# Patient Record
Sex: Male | Born: 1940
Health system: Southern US, Community
[De-identification: ages and names within clinical notes are randomized; demographics above are authoritative.]

## PROBLEM LIST (undated history)

## (undated) DIAGNOSIS — O1410 Severe pre-eclampsia, unspecified trimester: Secondary | ICD-10-CM

## (undated) DIAGNOSIS — Z950 Presence of cardiac pacemaker: Secondary | ICD-10-CM

## (undated) DIAGNOSIS — E7800 Pure hypercholesterolemia, unspecified: Secondary | ICD-10-CM

## (undated) DIAGNOSIS — I3139 Other pericardial effusion (noninflammatory): Secondary | ICD-10-CM

## (undated) DIAGNOSIS — I639 Cerebral infarction, unspecified: Secondary | ICD-10-CM

## (undated) DIAGNOSIS — C801 Malignant (primary) neoplasm, unspecified: Secondary | ICD-10-CM

## (undated) DIAGNOSIS — E119 Type 2 diabetes mellitus without complications: Secondary | ICD-10-CM

## (undated) DIAGNOSIS — I10 Essential (primary) hypertension: Secondary | ICD-10-CM

## (undated) DIAGNOSIS — C61 Malignant neoplasm of prostate: Secondary | ICD-10-CM

## (undated) DIAGNOSIS — I672 Cerebral atherosclerosis: Secondary | ICD-10-CM

## (undated) DIAGNOSIS — I441 Atrioventricular block, second degree: Secondary | ICD-10-CM

## (undated) DIAGNOSIS — Z8673 Personal history of transient ischemic attack (TIA), and cerebral infarction without residual deficits: Secondary | ICD-10-CM

## (undated) DIAGNOSIS — M1990 Unspecified osteoarthritis, unspecified site: Secondary | ICD-10-CM

## (undated) DIAGNOSIS — I313 Pericardial effusion (noninflammatory): Secondary | ICD-10-CM

## (undated) DIAGNOSIS — G4733 Obstructive sleep apnea (adult) (pediatric): Secondary | ICD-10-CM

## (undated) HISTORY — DX: Presence of cardiac pacemaker: Z95.0

## (undated) HISTORY — DX: Obstructive sleep apnea (adult) (pediatric): G47.33

## (undated) HISTORY — DX: Malignant (primary) neoplasm, unspecified: C80.1

## (undated) HISTORY — DX: Type 2 diabetes mellitus without complications: E11.9

## (undated) HISTORY — DX: Unspecified osteoarthritis, unspecified site: M19.90

## (undated) HISTORY — DX: Essential (primary) hypertension: I10

## (undated) HISTORY — DX: Other pericardial effusion (noninflammatory): I31.39

## (undated) HISTORY — DX: Cerebral atherosclerosis: I67.2

## (undated) HISTORY — DX: Cerebral infarction, unspecified: I63.9

## (undated) HISTORY — DX: Atrioventricular block, second degree: I44.1

## (undated) HISTORY — DX: Severe pre-eclampsia, unspecified trimester: O14.10

## (undated) HISTORY — DX: Pure hypercholesterolemia, unspecified: E78.00

## (undated) HISTORY — DX: Personal history of transient ischemic attack (TIA), and cerebral infarction without residual deficits: Z86.73

## (undated) HISTORY — PX: EYE SURGERY: SHX253

## (undated) HISTORY — DX: Pericardial effusion (noninflammatory): I31.3

## (undated) HISTORY — DX: Malignant neoplasm of prostate: C61

---

## 1992-06-09 DIAGNOSIS — I639 Cerebral infarction, unspecified: Secondary | ICD-10-CM

## 1992-06-09 HISTORY — DX: Cerebral infarction, unspecified: I63.9

## 2000-06-09 DIAGNOSIS — C801 Malignant (primary) neoplasm, unspecified: Secondary | ICD-10-CM

## 2000-06-09 HISTORY — PX: PROSTATE SURGERY: SHX751

## 2000-06-09 HISTORY — DX: Malignant (primary) neoplasm, unspecified: C80.1

## 2005-05-03 ENCOUNTER — Inpatient Hospital Stay (HOSPITAL_COMMUNITY): Admission: EM | Admit: 2005-05-03 | Discharge: 2005-05-10 | Payer: Self-pay | Admitting: *Deleted

## 2005-05-06 ENCOUNTER — Encounter: Payer: Self-pay | Admitting: Cardiology

## 2005-05-09 HISTORY — PX: PACEMAKER INSERTION: SHX728

## 2005-09-26 DIAGNOSIS — G4733 Obstructive sleep apnea (adult) (pediatric): Secondary | ICD-10-CM

## 2005-09-26 HISTORY — DX: Obstructive sleep apnea (adult) (pediatric): G47.33

## 2010-12-12 ENCOUNTER — Emergency Department (HOSPITAL_COMMUNITY): Payer: No Typology Code available for payment source

## 2010-12-12 ENCOUNTER — Emergency Department (HOSPITAL_COMMUNITY)
Admission: EM | Admit: 2010-12-12 | Discharge: 2010-12-12 | Disposition: A | Discharge status: Home / Self Care | Payer: No Typology Code available for payment source | Attending: Emergency Medicine | Admitting: Emergency Medicine

## 2010-12-12 DIAGNOSIS — M545 Low back pain, unspecified: Secondary | ICD-10-CM | POA: Insufficient documentation

## 2010-12-12 DIAGNOSIS — Y92410 Unspecified street and highway as the place of occurrence of the external cause: Secondary | ICD-10-CM | POA: Insufficient documentation

## 2010-12-12 DIAGNOSIS — M542 Cervicalgia: Secondary | ICD-10-CM | POA: Insufficient documentation

## 2010-12-12 DIAGNOSIS — I10 Essential (primary) hypertension: Secondary | ICD-10-CM | POA: Insufficient documentation

## 2010-12-12 DIAGNOSIS — Z950 Presence of cardiac pacemaker: Secondary | ICD-10-CM | POA: Insufficient documentation

## 2011-01-01 ENCOUNTER — Ambulatory Visit (HOSPITAL_COMMUNITY)
Admission: RE | Admit: 2011-01-01 | Discharge: 2011-01-01 | Disposition: A | Discharge status: Home / Self Care | Payer: No Typology Code available for payment source | Source: Ambulatory Visit | Attending: Pulmonary Disease | Admitting: Pulmonary Disease

## 2011-01-01 DIAGNOSIS — M6281 Muscle weakness (generalized): Secondary | ICD-10-CM | POA: Insufficient documentation

## 2011-01-01 DIAGNOSIS — IMO0001 Reserved for inherently not codable concepts without codable children: Secondary | ICD-10-CM | POA: Insufficient documentation

## 2011-01-01 DIAGNOSIS — M542 Cervicalgia: Secondary | ICD-10-CM | POA: Insufficient documentation

## 2011-01-01 NOTE — Patient Instructions (Signed)
HEP
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
## 2011-01-07 ENCOUNTER — Ambulatory Visit (HOSPITAL_COMMUNITY)
Admission: RE | Admit: 2011-01-07 | Discharge: 2011-01-07 | Disposition: A | Discharge status: Home / Self Care | Payer: No Typology Code available for payment source | Source: Ambulatory Visit | Attending: Pulmonary Disease | Admitting: Pulmonary Disease

## 2011-01-07 NOTE — Progress Notes (Signed)
Physical Therapy Treatment Patient Name: FAVIO MODER WUJWJ'X Date: 01/07/2011  Time in:  8:47 Time out:  9:34 Visits #: 2/2 out of 12 Charges:  therex 14', [place] 8', MHP 10'    SUBJECTIVE: Symptoms/Limitations Symptoms: continued neck pain.  States last treatment helped some.  Currently 8/10 pain R mid trap area. Pain Assessment Currently in Pain?: Yes Pain Score:   8 Pain Location: Neck Pain Orientation: Right Pain Type: Acute pain Pain Radiating Towards: Right mid trap and shoulder Pain Frequency: Intermittent Pain Relieving Factors: heat temporarily relieves  Precautions/Restrictions:  None Noted        OBJECTIVE: Exercise/Treatments  Therex:  Warm-up:  UBE X 4' backward       Seated:   W-Back 10X     Cervical Ret 10X     Side Bends Bilaterally 10X     Cerv. Ext 10X     Shoulder Shrugs 10X  Modalities Modalities: Moist Heat Moist Heat Therapy Number Minutes Moist Heat: 10 Minutes Moist Heat Location: Shoulder Ultrasound Ultrasound Location: Right Mid trap area Ultrasound Parameters: soundhead, 1.5w/CM2 for 8 minutes Ultrasound Goals: Pain;Other (Comment) (spasm) Weight Bearing Technique Weight Bearing Technique: No  Goals PT Short Term Goals Short Term Goal 1: I HEP Short Term Goal 1 Progress: Partly met Short Term Goal 2: ROM to increase 50% to allow ease of turning his head to the right or left Short Term Goal 2 Progress: Not met Short Term Goal 3: strength increased one level to allow pain to decrease by 4 Short Term Goal 3 Progress: Not met PT Long Term Goals Long Term Goal 1: I in advanced HEP Long Term Goal 1 Progress: Not met Long Term Goal 2: ROM wnl to allow safe driving Long Term Goal 2 Progress: Not met Long Term Goal 3: strength to be at least 4/5 to allow pain to decrease by 8 levels. Long Term Goal 3 Progress: Not met  End of Session Patient Active Problem List  Diagnoses  . Cervicalgia  . Muscle weakness (generalized)   PT  - End of Session Activity Tolerance: Patient tolerated treatment well General Behavior During Session: [facility] for tasks performed Cognition: [facility] for tasks performed PT Assessment and Plan Clinical Impression Statement: Pt. very protective with movements; very little ROM secondary to pain and spasm. PT Plan: Continue to progress per POC.  Add corner stretch and X-->V next treatment.  Bascom Levels, Amy B 01/07/2011, 9:34 AM

## 2011-01-07 NOTE — Patient Instructions (Signed)
Pt. Instructed to continue HEP.

## 2011-01-09 ENCOUNTER — Ambulatory Visit (HOSPITAL_COMMUNITY)
Admission: RE | Admit: 2011-01-09 | Discharge: 2011-01-09 | Disposition: A | Discharge status: Home / Self Care | Payer: No Typology Code available for payment source | Source: Ambulatory Visit | Attending: Pulmonary Disease | Admitting: Pulmonary Disease

## 2011-01-09 DIAGNOSIS — M542 Cervicalgia: Secondary | ICD-10-CM | POA: Insufficient documentation

## 2011-01-09 DIAGNOSIS — IMO0001 Reserved for inherently not codable concepts without codable children: Secondary | ICD-10-CM | POA: Insufficient documentation

## 2011-01-09 DIAGNOSIS — M6281 Muscle weakness (generalized): Secondary | ICD-10-CM | POA: Insufficient documentation
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
## 2011-01-09 NOTE — Patient Instructions (Signed)
HEP

## 2011-01-10 ENCOUNTER — Ambulatory Visit (HOSPITAL_COMMUNITY)
Admission: RE | Admit: 2011-01-10 | Discharge: 2011-01-10 | Disposition: A | Discharge status: Home / Self Care | Payer: No Typology Code available for payment source | Source: Ambulatory Visit | Attending: Pulmonary Disease | Admitting: Pulmonary Disease
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
## 2011-01-14 ENCOUNTER — Ambulatory Visit (HOSPITAL_COMMUNITY)
Admission: RE | Admit: 2011-01-14 | Discharge: 2011-01-14 | Disposition: A | Discharge status: Home / Self Care | Payer: No Typology Code available for payment source | Source: Ambulatory Visit | Attending: Pulmonary Disease | Admitting: Pulmonary Disease

## 2011-01-14 DIAGNOSIS — M542 Cervicalgia: Secondary | ICD-10-CM

## 2011-01-14 DIAGNOSIS — M6281 Muscle weakness (generalized): Secondary | ICD-10-CM

## 2011-01-14 NOTE — Progress Notes (Signed)
Physical Therapy Treatment Patient Name: Richard Alvarado ZOXWR'U Date: 01/14/2011  Time In: 8:02 Time Out: 8:45 Visit #: 5/5 Next Re-eval: 02/01/2011 Charge: therex 30 min Manual 5 min [place] 8 min  Subjective: Symptoms/Limitations Symptoms: Feeling little better today, the ultrasound has been reducing pain, would rate it a 7/10. Pain Assessment Pain Score:   7 Pain Location: Neck Pain Orientation: Right Pain Type: Chronic pain  Objective:   Exercise/Treatments  UBE (Upper Arm Bike): 4' backward  W-Back 10X5" (2#) X to V 10 reps Cervical Ret 10X  Side Bends Bilaterally 10X  Cerv. Ext 10X  Shoulder Shrugs 10X Theraband green Shoulder Extension: 15 reps Row: 15 reps  Scapular Retraction: 15reps x 5"   U.S. soundhead, 1.5w/CM2 for 8 minutes  Goals   End of Session Patient Active Problem List  Diagnoses  . Cervicalgia  . Muscle weakness (generalized)   PT Assessment and Plan Clinical Impression Statement: Pt still with limited ROM, ROM improved following [place] and PROM. PT Plan: continue to improve ROM  Juel Burrow 01/14/2011, 8:50 AM

## 2011-01-16 ENCOUNTER — Ambulatory Visit (HOSPITAL_COMMUNITY)
Admission: RE | Admit: 2011-01-16 | Discharge: 2011-01-16 | Disposition: A | Discharge status: Home / Self Care | Payer: No Typology Code available for payment source | Source: Ambulatory Visit | Attending: Physical Therapy | Admitting: Physical Therapy

## 2011-01-16 NOTE — Progress Notes (Signed)
Physical Therapy Treatment Patient Name: Richard Alvarado BJYNW'G Date: 01/16/2011  Time In: 8:10  Time Out: 8:45  Visit #: 6/6 Next Re-eval: 02/01/2011  Charge: therex 12 min, Manual 10 min, [place] 8 min   SUBJECTIVE: Symptoms/Limitations Symptoms: feeling better today, 6/10 pain Pain Assessment Currently in Pain?: Yes Pain Score:   6 Pain Location: Neck Pain Orientation: Right Pain Type: Chronic pain Pain Radiating Towards: Right mid trap and shoulder Multiple Pain Sites: No        OBJECTIVE: Exercise/Treatments  UBE (Upper Arm Bike): 4' backward  W-Back 15X5" (2#)  X to V 15 reps  Cervical Ret 10X  Side Bends Bilaterally (HEP)  Cerv. Ext (HEP) Shoulder Shrugs 10X  Theraband green  Shoulder Extension: 15 reps  Row: 15 reps  Scapular Retraction: 15reps x 5  Modalities Modalities: Ultrasound Manual Therapy Manual Therapy: Other (comment) Other Manual Therapy: massage to Right upper trap Ultrasound Ultrasound Location: Right upper trap Ultrasound Parameters: 1.5w/cm2 continuous with soundhead. Ultrasound Goals: Pain;Other (Comment) (spasm) Weight Bearing Technique Weight Bearing Technique: No  Goals   End of Session Patient Active Problem List  Diagnoses  . Cervicalgia  . Muscle weakness (generalized)   PT - End of Session Activity Tolerance: Patient tolerated treatment well General Behavior During Session: [facility] for tasks performed PT Assessment and Plan Clinical Impression Statement: Pt. compliant with HEP, Less guarding with [place] and massage today with overall decreased pain. PT Treatment/Interventions: Therapeutic exercise (Ultrasound and massage) PT Plan: continue per POC  Frazier, Amy B 01/16/2011, 8:51 AM

## 2011-01-17 ENCOUNTER — Ambulatory Visit (HOSPITAL_COMMUNITY)
Admission: RE | Admit: 2011-01-17 | Discharge: 2011-01-17 | Disposition: A | Discharge status: Home / Self Care | Payer: No Typology Code available for payment source | Source: Ambulatory Visit | Attending: Physical Therapy | Admitting: Physical Therapy

## 2011-01-17 NOTE — Progress Notes (Signed)
Physical Therapy Treatment Patient Name: Richard Alvarado JYNWG'N Date: 01/17/2011  HPI: cervical pain Symptoms/Limitations Symptoms: My neck is feeling better 5/10 Pain Assessment Currently in Pain?: Yes Pain Score:   5 Pain Location: Neck Pain Orientation: Left Pain Type: Chronic pain Pain Onset: 1 to 4 weeks ago Pain Frequency: Intermittent Multiple Pain Sites: No  Precautions/Restrictions     Mobility (including Balance)       Exercise/Treatments Cervical Exercises Neck Lateral Flexion - Right: 10 reps (with massage and gentle PROM) Neck Lateral Flexion - Left: 10 reps Neck Rotation - Right: 10 reps (with massage and gentle PROM) Neck Rotation - Left: 10 reps (with massage and gentle PROM) Shoulder Extension: 15 reps Theraband Level (Shoulder Extension): Level 3 (Green) Row: 15 reps Theraband Level (Row): Level 3 (Green) Scapular Retraction: 15 reps Theraband Level (Scapular Retraction): Level 3 (Green) W Back: 15 reps;Weight X to V: 15 reps Additional Neck Exercises Wall Pushups/Modified Pushups: 10x UBE (Upper Arm Bike): 4' backward Lumbar Exercises Scapular Retraction: 15 reps Theraband Level (Scapular Retraction): Level 3 (Green) Row: 15 reps Theraband Level (Row): Level 3 (Green) Shoulder Extension: 15 reps Theraband Level (Shoulder Extension): Level 3 (Green) Lumbar Machine Exercises UBE (Upper Arm Bike): 4' backward Modalities Modalities: Ultrasound Ultrasound Ultrasound Location: L mid trap Ultrasound Parameters: 1.5 w/cm2 Ultrasound Goals: Pain  Goals PT Short Term Goals Short Term Goal 1 Progress: Met Short Term Goal 2 Progress: Met Short Term Goal 3 Progress: Met PT Long Term Goals Long Term Goal 1 Progress: Progressing toward goal Long Term Goal 2 Progress: Progressing toward goal Long Term Goal 3 Progress: Progressing toward goal End of Session Patient Active Problem List  Diagnoses  . Cervicalgia  . Muscle weakness (generalized)    PT - End of Session Activity Tolerance: Patient tolerated treatment well General Behavior During Session: [facility] for tasks performed Cognition: [facility] for tasks performed PT Assessment and Plan Clinical Impression Statement: improving subjective and objectively PT Frequency: Min 3X/week PT Plan: D/C T-band exercises to HEP  RUSSELL,CINDY 01/17/2011, 8:53 AM

## 2011-01-17 NOTE — Patient Instructions (Signed)
Given T-band

## 2011-01-21 ENCOUNTER — Ambulatory Visit (HOSPITAL_COMMUNITY)
Admission: RE | Admit: 2011-01-21 | Discharge: 2011-01-21 | Disposition: A | Discharge status: Home / Self Care | Payer: No Typology Code available for payment source | Source: Ambulatory Visit | Attending: *Deleted | Admitting: *Deleted

## 2011-01-21 NOTE — Progress Notes (Addendum)
Physical Therapy Treatment Patient Name: Richard Alvarado ZOXWR'U Date: 01/21/2011  Time In: 8:05  Time Out: 8:45  Visit #: 8/8  Next Re-eval: 02/01/2011  Charge: therex 15 min, Manual 8 min, [place] 8 min     HPI: Symptoms/Limitations Symptoms: Pain is better than it has been. Pain Assessment Currently in Pain?: Yes Pain Score:   5 Pain Location: Neck Pain Orientation: Right Pain Type: Chronic pain Pain Frequency: Intermittent   Exercise/Treatments  Warm up: UBE (Upper Arm Bike): 4' backward   Standing:  Theraband green  Shoulder Extension: 20 reps  Row: 20 reps  Scapular Retraction: 20reps x 5 Wall push-ups x 10  Seated: W-Back 15X5" (2#)  X to V 15 reps  Cervical Ret 10X  Shoulder Shrugs 10X   Modalities Modalities: Ultrasound Manual Therapy Other Manual Therapy: STM with MFR to R upper trap Ultrasound Ultrasound Location: R upper trap Ultrasound Parameters: 1.5 W/cm2  3 mHz  Goals PT Short Term Goals Short Term Goal 1: I HEP Short Term Goal 1 Progress: Met Short Term Goal 2: ROM to increase 50% to allow ease of turning his head to the right or left Short Term Goal 2 Progress: Met Short Term Goal 3: strength increased one level to allow pain to decrease by 4 Short Term Goal 3 Progress: Met PT Long Term Goals Long Term Goal 1: I in advanced HEP Long Term Goal 1 Progress: Progressing toward goal Long Term Goal 2: ROM wnl to allow safe driving Long Term Goal 2 Progress: Progressing toward goal Long Term Goal 3: strength to be at least 4/5 to allow pain to decrease by 8 levels. Long Term Goal 3 Progress: Progressing toward goal End of Session Patient Active Problem List  Diagnoses  . Cervicalgia  . Muscle weakness (generalized)   PT - End of Session Activity Tolerance: Patient tolerated treatment well General Behavior During Session: [facility] for tasks performed Cognition: [facility] for tasks performed PT Assessment and Plan Clinical Impression Statement:  Pt completes therex without c/o increased pain. Pt requires vcs for proper technique with scapular tband therex. Tband exercises completed this tx to ensure proper technique. Pt with pain decrease to 4/10 after ultrasound and  STM, per pt. PT Treatment/Interventions: Therapeutic exercise;Other (comment) (STM and ultrasound) PT Plan: Continue to progress per PT POC. D/C tband ex to HEP.  Seth Bake [facility] 01/21/2011, 8:54 AM

## 2011-01-23 ENCOUNTER — Ambulatory Visit (HOSPITAL_COMMUNITY)
Admission: RE | Admit: 2011-01-23 | Discharge: 2011-01-23 | Disposition: A | Discharge status: Home / Self Care | Payer: No Typology Code available for payment source | Source: Ambulatory Visit | Attending: Physical Therapy | Admitting: Physical Therapy

## 2011-01-23 NOTE — Progress Notes (Signed)
Physical Therapy Treatment Patient Details  Name: CAPRICE WASKO MRN: [id number] Date of Birth: [birth year]  Today's Date: 01/23/2011 Time: 4098-1191 Time Calculation (min): 40 min Visit#: 9 of 9 Re-eval: 02/01/11 Charges:Therex x 16' ; [place] x 8'; Manual therapy x 10'  Subjective: Symptoms/Limitations Symptoms: No pain today. Pain Assessment Currently in Pain?: No/denies   Exercise/Treatments  Warm up:  UBE (Upper Arm Bike): 4' backward   Standing:  Wall push-ups x 10   Seated:  W-Back 15X5" (2#)  X to V 15 reps (2#) Cervical Ret 15X  Shoulder Shrugs 10X (2#) Upper trap stretch 3X30"  Modalities Modalities: Ultrasound Manual Therapy Manual Therapy: Other (comment) Other Manual Therapy: STM with MFR to R upper trap x 10" Ultrasound Ultrasound Location: R upper trap  Ultrasound Parameters: 1.5 W/cm2 3 mHz  Physical Therapy Assessment and Plan PT Assessment and Plan Clinical Impression Statement: Began upper trap stretch to decrease mm tightness in R upper trap. HEP given for upper strap stretch, see media. Pt with mm spasms in R upper trap and SCM. Mm spasm decreased 40% after STM with MFR. Added 2# wt to X to V and shoulder shrugs to increase strength w/o difficulty. PT Duration: Other (comment) PT Treatment/Interventions: Therapeutic exercise;Other (comment) (Manual therapy and ultrasound) PT Plan: Continue to progress strength and ROM.     Goals PT Short Term Goals PT Short Term Goal 1: I HEP PT Short Term Goal 1 - Progress: Met PT Short Term Goal 2: ROM to increase 50% to allow ease of turning his head to the right or left PT Short Term Goal 2 - Progress: Met PT Short Term Goal 3: strength increased one level to allow pain to decrease by 4 PT Short Term Goal 3 - Progress: Met PT Long Term Goals PT Long Term Goal 1: I in advanced HEP PT Long Term Goal 1 - Progress: Progressing toward goal PT Long Term Goal 2: ROM wnl to allow safe driving PT Long Term  Goal 2 - Progress: Progressing toward goal Long Term Goal 3: strength to be at least 4/5 to allow pain to decrease by 8 levels. Long Term Goal 3 Progress: Progressing toward goal  Problem List Patient Active Problem List  Diagnoses  . Cervicalgia  . Muscle weakness (generalized)    PT - End of Session Activity Tolerance: Patient tolerated treatment well General Behavior During Session: [facility] for tasks performed Cognition: [facility] for tasks performed  Antonieta Iba 01/23/2011, 9:02 AM

## 2011-01-24 ENCOUNTER — Ambulatory Visit (HOSPITAL_COMMUNITY)
Admission: RE | Admit: 2011-01-24 | Discharge: 2011-01-24 | Disposition: A | Discharge status: Home / Self Care | Payer: No Typology Code available for payment source | Source: Ambulatory Visit | Attending: *Deleted | Admitting: *Deleted

## 2011-01-24 NOTE — Progress Notes (Signed)
Physical Therapy Treatment Patient Details  Name: Richard Alvarado MRN: [id number] Date of Birth: Aug 12, 1940  Today's Date: 01/24/2011 Time: 4098-1191 Time Calculation (min): 47 min Visit#: 9 Re-eval: 02/01/11 Charges: Therex 74' ; [place] 8' ; Manual therapy 11'  Subjective: Symptoms/Limitations Symptoms: I'm a little sore today. Pain Assessment Currently in Pain?: Yes Pain Score:   4 Pain Location: Neck Pain Orientation: Right   Exercise/Treatments  Warm up:  UBE (Upper Arm Bike): 4' backward   Standing:  Wall push-ups x 15   Seated:  W-Back 15X5" (2#)  X to V 15 reps (2#)   Shoulder Shrugs 10X (2#)  Upper trap stretch 3X30"   Prone: Cervical retraction 10 reps Shoulder ext 10 reps  Modalities Modalities: Ultrasound Manual Therapy Manual Therapy: Other (comment) Other Manual Therapy: STM with SCS to R upper trap x 11' Ultrasound Ultrasound Location: R upper trap Ultrasound Parameters: 1.5 W/cm2 3 mHz x 8'  Physical Therapy Assessment and Plan PT Assessment and Plan Clinical Impression Statement: Began new prone therex. Pt requires VCs and visual demo for proper technique. Pt with decreased pain after manual therapy with most relief after SCS. PT Treatment/Interventions: Therapeutic exercise;Other (comment) (manual therapy and ultrasound) PT Plan: Continue to progress. Replace [place] with MHP next tx before manual therapy.    Goals PT Short Term Goals PT Short Term Goal 1: I HEP PT Short Term Goal 1 - Progress: Met PT Short Term Goal 2: ROM to increase 50% to allow ease of turning his head to the right or left PT Short Term Goal 2 - Progress: Met PT Short Term Goal 3: strength increased one level to allow pain to decrease by 4 PT Short Term Goal 3 - Progress: Met PT Long Term Goals PT Long Term Goal 1: I in advanced HEP PT Long Term Goal 1 - Progress: Progressing toward goal PT Long Term Goal 2: ROM wnl to allow safe driving PT Long Term Goal 2 - Progress:  Progressing toward goal Long Term Goal 3: strength to be at least 4/5 to allow pain to decrease by 8 levels. Long Term Goal 3 Progress: Progressing toward goal  Problem List Patient Active Problem List  Diagnoses  . Cervicalgia  . Muscle weakness (generalized)    PT - End of Session Activity Tolerance: Patient tolerated treatment well General Behavior During Session: [facility] for tasks performed Cognition: [facility] for tasks performed  Antonieta Iba 01/24/2011, 10:52 AM

## 2011-01-27 ENCOUNTER — Ambulatory Visit (HOSPITAL_COMMUNITY)
Admission: RE | Admit: 2011-01-27 | Discharge: 2011-01-27 | Disposition: A | Discharge status: Home / Self Care | Payer: No Typology Code available for payment source | Source: Ambulatory Visit | Attending: Physical Therapy | Admitting: Physical Therapy

## 2011-01-27 NOTE — Progress Notes (Signed)
Physical Therapy Treatment Patient Details  Name: Richard Alvarado MRN: [id number] Date of Birth: 08-Mar-1941  Today's Date: 01/27/2011 Time: 4098-1191 Time Calculation (min): 48 min Visit#: 10 of 12 Re-eval: 01/31/11 Charges:  Therex 25', Ultrasound 8', Manual therapy 10'  Subjective: Symptoms/Limitations Symptoms: Pt. reports an improvement.  Overall decreased pain; 3/10 Pain Assessment Currently in Pain?: Yes Pain Score:   3 Pain Location: Neck     Exercise/Treatments Warm up:  UBE (Upper Arm Bike): 4' backward  Standing:  Wall push-ups x 15  Seated:  W-Back 15X5" (2#)  X to V 15 reps (2#)  Shoulder Shrugs 15X (2#)  Upper trap stretch 3X30"  Prone:  Rows 15 reps  Shoulder ext 15 reps  Scapular retraction (w-back)  15X UE lifts (one at a time) 10X  Modalities Modalities: Ultrasound Manual Therapy Manual Therapy: Other (comment) (STM with strain/counterstrain to R upper trap) Ultrasound Ultrasound Location: R upper trap Ultrasound Parameters: 1.5 w/cm2 with 3 MHz for 8 minutes Ultrasound Goals: Pain  Physical Therapy Assessment and Plan PT Assessment and Plan Clinical Impression Statement: spasm inferior R occipital region resolved with STM.  Pt very guarded during STM but able to relax after SCS technique.  Overall decreased pain and spasm.  Continued [place] secondary to pain reduction after last visit. PT Treatment/Interventions: Therapeutic exercise (Manual therapy and Ultrasound) PT Plan: Continue X 2 more visits and re-evaluate.    Goals PT Short Term Goals PT Short Term Goal 1: I HEP PT Short Term Goal 1 - Progress: Met PT Short Term Goal 2: ROM to increase 50% to allow ease of turning his head to the right or left PT Short Term Goal 2 - Progress: Met PT Short Term Goal 3: strength increased one level to allow pain to decrease by 4 PT Short Term Goal 3 - Progress: Met PT Long Term Goals PT Long Term Goal 1: I in advanced HEP PT Long Term Goal 1 -  Progress: Progressing toward goal PT Long Term Goal 2: ROM wnl to allow safe driving PT Long Term Goal 2 - Progress: Progressing toward goal Long Term Goal 3: strength to be at least 4/5 to allow pain to decrease by 8 levels. Long Term Goal 3 Progress: Progressing toward goal  Problem List Patient Active Problem List  Diagnoses  . Cervicalgia  . Muscle weakness (generalized)    PT - End of Session Activity Tolerance: Patient tolerated treatment well General Behavior During Session: [facility] for tasks performed Cognition: [facility] for tasks performed  Emeline Gins B 01/27/2011, 5:05 PM

## 2011-01-31 ENCOUNTER — Ambulatory Visit (HOSPITAL_COMMUNITY)
Admission: RE | Admit: 2011-01-31 | Discharge: 2011-01-31 | Disposition: A | Discharge status: Home / Self Care | Payer: No Typology Code available for payment source | Source: Ambulatory Visit | Attending: Pulmonary Disease | Admitting: Pulmonary Disease

## 2011-01-31 DIAGNOSIS — M542 Cervicalgia: Secondary | ICD-10-CM

## 2011-01-31 DIAGNOSIS — M6281 Muscle weakness (generalized): Secondary | ICD-10-CM

## 2011-01-31 NOTE — Progress Notes (Signed)
Physical Therapy Evaluation  Patient Details  Name: JAHMEIR GEISEN MRN: [id number] Date of Birth: [birth year]  Today's Date: 01/31/2011 Time: 9562-1308 Time Calculation (min): 49 min Visit#: 12 of 12 Re-eval: 01/31/11  Past Medical History: No past medical history on file. Past Surgical History: No past surgical history on file.  Subjective Symptoms/Limitations Symptoms: Pain is better 2.5  Precautions/Restrictions     Prior Functioning     Cognition    Sensation/Coordination/Flexibility    Assessment Cervical AROM Cervical Flexion:  (wnl) Cervical Extension: decreased 20% Cervical - Right Side Bend: wnl Cervical - Left Side Bend: wnl Cervical - Right Rotation: wnl Cervical - Left Rotation: decreased 20% Cervical Strength Cervical Flexion: 4/5 Cervical Extension: 4/5 Cervical - Right Side Bend: 4/5 Cervical - Left Side Bend: 4/5  Mobility (including Balance)       Exercise/Treatments Stretches  Passive stretching with massage. Neck Exercises Shoulder Extension: 15 reps;Prone;Other (comment) (3#) Row: Prone;15 reps (3#) W Back: Prone;15 reps Additional Neck Exercises UBE (Upper Arm Bike): 4' Plank:  (on forearms 10 seconds x 3.)  Modalities Modalities: Ultrasound Manual Therapy Manual Therapy: Myofascial release Myofascial Release: massage to R trap. Ultrasound Ultrasound Location: R trap Ultrasound Parameters: 1.5 w/cm2 x8 min Ultrasound Goals: Pain  Physical Therapy Assessment and Plan PT Assessment and Plan Clinical Impression Statement: Pt has progressed well with therapy recommend discharge and patient to work on ROM and strength on his won. PT Plan: discharge patient.    Goals PT Short Term Goals PT Short Term Goal 1 - Progress: Met PT Short Term Goal 2 - Progress: Met PT Short Term Goal 3 - Progress: Met PT Long Term Goals PT Long Term Goal 1 - Progress: Met PT Long Term Goal 2 - Progress: Progressing toward goal Long Term Goal  3 Progress: Met  Problem List Patient Active Problem List  Diagnoses  . Cervicalgia  . Muscle weakness (generalized)    PT - End of Session Activity Tolerance: Patient tolerated treatment well General Behavior During Session: [facility] for tasks performed Cognition: [facility] for tasks performed   RUSSELL,CINDY 01/31/2011, 4:17 PM  Physician Documentation Your signature is required to indicate approval of the treatment plan as stated above.  Please sign and either send electronically or make a copy of this report for your files and return this physician signed original.   Please mark one 1.__approve of plan  2. ___approve of plan with the following conditions.   ______________________________                                                          _____________________ Physician Signature                                                                                                             Date

## 2011-01-31 NOTE — Patient Instructions (Addendum)
HEP

## 2011-06-17 DIAGNOSIS — G4730 Sleep apnea, unspecified: Secondary | ICD-10-CM | POA: Diagnosis not present

## 2011-06-17 DIAGNOSIS — E785 Hyperlipidemia, unspecified: Secondary | ICD-10-CM | POA: Diagnosis not present

## 2011-06-17 DIAGNOSIS — IMO0001 Reserved for inherently not codable concepts without codable children: Secondary | ICD-10-CM | POA: Diagnosis not present

## 2011-06-17 DIAGNOSIS — I10 Essential (primary) hypertension: Secondary | ICD-10-CM | POA: Diagnosis not present

## 2011-07-30 DIAGNOSIS — I6789 Other cerebrovascular disease: Secondary | ICD-10-CM | POA: Diagnosis not present

## 2011-07-30 DIAGNOSIS — E785 Hyperlipidemia, unspecified: Secondary | ICD-10-CM | POA: Diagnosis not present

## 2011-07-30 DIAGNOSIS — I2510 Atherosclerotic heart disease of native coronary artery without angina pectoris: Secondary | ICD-10-CM | POA: Diagnosis not present

## 2011-07-30 DIAGNOSIS — IMO0001 Reserved for inherently not codable concepts without codable children: Secondary | ICD-10-CM | POA: Diagnosis not present

## 2011-09-22 DIAGNOSIS — Z45018 Encounter for adjustment and management of other part of cardiac pacemaker: Secondary | ICD-10-CM | POA: Diagnosis not present

## 2011-09-22 DIAGNOSIS — I10 Essential (primary) hypertension: Secondary | ICD-10-CM | POA: Diagnosis not present

## 2011-09-22 DIAGNOSIS — I4510 Unspecified right bundle-branch block: Secondary | ICD-10-CM | POA: Diagnosis not present

## 2011-09-23 DIAGNOSIS — E119 Type 2 diabetes mellitus without complications: Secondary | ICD-10-CM | POA: Diagnosis not present

## 2011-09-23 DIAGNOSIS — E782 Mixed hyperlipidemia: Secondary | ICD-10-CM | POA: Diagnosis not present

## 2011-09-23 DIAGNOSIS — R5381 Other malaise: Secondary | ICD-10-CM | POA: Diagnosis not present

## 2011-09-23 DIAGNOSIS — Z79899 Other long term (current) drug therapy: Secondary | ICD-10-CM | POA: Diagnosis not present

## 2011-09-24 DIAGNOSIS — I10 Essential (primary) hypertension: Secondary | ICD-10-CM | POA: Diagnosis not present

## 2011-09-24 DIAGNOSIS — I2510 Atherosclerotic heart disease of native coronary artery without angina pectoris: Secondary | ICD-10-CM | POA: Diagnosis not present

## 2011-09-24 DIAGNOSIS — IMO0001 Reserved for inherently not codable concepts without codable children: Secondary | ICD-10-CM | POA: Diagnosis not present

## 2011-09-24 DIAGNOSIS — E785 Hyperlipidemia, unspecified: Secondary | ICD-10-CM | POA: Diagnosis not present

## 2011-10-06 DIAGNOSIS — I10 Essential (primary) hypertension: Secondary | ICD-10-CM | POA: Diagnosis not present

## 2011-11-17 DIAGNOSIS — C61 Malignant neoplasm of prostate: Secondary | ICD-10-CM | POA: Diagnosis not present

## 2011-12-02 DIAGNOSIS — C61 Malignant neoplasm of prostate: Secondary | ICD-10-CM | POA: Diagnosis not present

## 2012-01-13 DIAGNOSIS — IMO0001 Reserved for inherently not codable concepts without codable children: Secondary | ICD-10-CM | POA: Diagnosis not present

## 2012-01-13 DIAGNOSIS — G4730 Sleep apnea, unspecified: Secondary | ICD-10-CM | POA: Diagnosis not present

## 2012-01-13 DIAGNOSIS — E785 Hyperlipidemia, unspecified: Secondary | ICD-10-CM | POA: Diagnosis not present

## 2012-01-13 DIAGNOSIS — I10 Essential (primary) hypertension: Secondary | ICD-10-CM | POA: Diagnosis not present

## 2012-01-23 DIAGNOSIS — E119 Type 2 diabetes mellitus without complications: Secondary | ICD-10-CM | POA: Diagnosis not present

## 2012-01-23 DIAGNOSIS — E782 Mixed hyperlipidemia: Secondary | ICD-10-CM | POA: Diagnosis not present

## 2012-01-23 DIAGNOSIS — Z45018 Encounter for adjustment and management of other part of cardiac pacemaker: Secondary | ICD-10-CM | POA: Diagnosis not present

## 2012-01-23 DIAGNOSIS — I10 Essential (primary) hypertension: Secondary | ICD-10-CM | POA: Diagnosis not present

## 2012-01-26 DIAGNOSIS — E878 Other disorders of electrolyte and fluid balance, not elsewhere classified: Secondary | ICD-10-CM | POA: Diagnosis not present

## 2012-01-26 DIAGNOSIS — Z79899 Other long term (current) drug therapy: Secondary | ICD-10-CM | POA: Diagnosis not present

## 2012-01-26 DIAGNOSIS — E119 Type 2 diabetes mellitus without complications: Secondary | ICD-10-CM | POA: Diagnosis not present

## 2012-02-23 DIAGNOSIS — C61 Malignant neoplasm of prostate: Secondary | ICD-10-CM | POA: Diagnosis not present

## 2012-03-31 DIAGNOSIS — I2510 Atherosclerotic heart disease of native coronary artery without angina pectoris: Secondary | ICD-10-CM | POA: Diagnosis not present

## 2012-03-31 DIAGNOSIS — E119 Type 2 diabetes mellitus without complications: Secondary | ICD-10-CM | POA: Diagnosis not present

## 2012-03-31 DIAGNOSIS — E782 Mixed hyperlipidemia: Secondary | ICD-10-CM | POA: Diagnosis not present

## 2012-03-31 DIAGNOSIS — I10 Essential (primary) hypertension: Secondary | ICD-10-CM | POA: Diagnosis not present

## 2012-03-31 HISTORY — PX: NM MYOVIEW LTD: HXRAD82

## 2012-04-14 DIAGNOSIS — E109 Type 1 diabetes mellitus without complications: Secondary | ICD-10-CM | POA: Diagnosis not present

## 2012-04-14 DIAGNOSIS — I10 Essential (primary) hypertension: Secondary | ICD-10-CM | POA: Diagnosis not present

## 2012-04-14 DIAGNOSIS — IMO0001 Reserved for inherently not codable concepts without codable children: Secondary | ICD-10-CM | POA: Diagnosis not present

## 2012-04-14 DIAGNOSIS — E785 Hyperlipidemia, unspecified: Secondary | ICD-10-CM | POA: Diagnosis not present

## 2012-04-14 DIAGNOSIS — G4730 Sleep apnea, unspecified: Secondary | ICD-10-CM | POA: Diagnosis not present

## 2012-05-24 DIAGNOSIS — I10 Essential (primary) hypertension: Secondary | ICD-10-CM | POA: Diagnosis not present

## 2012-05-24 DIAGNOSIS — I4510 Unspecified right bundle-branch block: Secondary | ICD-10-CM | POA: Diagnosis not present

## 2012-05-24 DIAGNOSIS — E782 Mixed hyperlipidemia: Secondary | ICD-10-CM | POA: Diagnosis not present

## 2012-05-24 DIAGNOSIS — Z45018 Encounter for adjustment and management of other part of cardiac pacemaker: Secondary | ICD-10-CM | POA: Diagnosis not present

## 2012-05-25 DIAGNOSIS — E119 Type 2 diabetes mellitus without complications: Secondary | ICD-10-CM | POA: Diagnosis not present

## 2012-05-25 DIAGNOSIS — E782 Mixed hyperlipidemia: Secondary | ICD-10-CM | POA: Diagnosis not present

## 2012-05-25 DIAGNOSIS — R5381 Other malaise: Secondary | ICD-10-CM | POA: Diagnosis not present

## 2012-05-25 DIAGNOSIS — Z79899 Other long term (current) drug therapy: Secondary | ICD-10-CM | POA: Diagnosis not present

## 2012-05-28 ENCOUNTER — Ambulatory Visit (HOSPITAL_COMMUNITY)
Admission: RE | Admit: 2012-05-28 | Discharge: 2012-05-28 | Disposition: A | Discharge status: Home / Self Care | Payer: Medicare Other | Source: Ambulatory Visit | Attending: Cardiovascular Disease | Admitting: Cardiovascular Disease

## 2012-05-28 DIAGNOSIS — I10 Essential (primary) hypertension: Secondary | ICD-10-CM | POA: Diagnosis not present

## 2012-05-28 DIAGNOSIS — I369 Nonrheumatic tricuspid valve disorder, unspecified: Secondary | ICD-10-CM | POA: Insufficient documentation

## 2012-05-28 DIAGNOSIS — I059 Rheumatic mitral valve disease, unspecified: Secondary | ICD-10-CM | POA: Diagnosis not present

## 2012-05-28 DIAGNOSIS — I379 Nonrheumatic pulmonary valve disorder, unspecified: Secondary | ICD-10-CM | POA: Diagnosis not present

## 2012-05-28 DIAGNOSIS — I319 Disease of pericardium, unspecified: Secondary | ICD-10-CM | POA: Diagnosis not present

## 2012-05-28 DIAGNOSIS — I2589 Other forms of chronic ischemic heart disease: Secondary | ICD-10-CM | POA: Diagnosis not present

## 2012-05-28 NOTE — Progress Notes (Signed)
*  PRELIMINARY RESULTS* Echocardiogram 2D Echocardiogram has been performed.  Conrad [place] 05/28/2012, 9:46 AM

## 2012-06-07 DIAGNOSIS — R972 Elevated prostate specific antigen [PSA]: Secondary | ICD-10-CM | POA: Diagnosis not present

## 2012-06-08 DIAGNOSIS — C61 Malignant neoplasm of prostate: Secondary | ICD-10-CM | POA: Diagnosis not present

## 2012-07-15 DIAGNOSIS — I10 Essential (primary) hypertension: Secondary | ICD-10-CM | POA: Diagnosis not present

## 2012-07-15 DIAGNOSIS — G4730 Sleep apnea, unspecified: Secondary | ICD-10-CM | POA: Diagnosis not present

## 2012-07-15 DIAGNOSIS — I259 Chronic ischemic heart disease, unspecified: Secondary | ICD-10-CM | POA: Diagnosis not present

## 2012-07-15 DIAGNOSIS — E785 Hyperlipidemia, unspecified: Secondary | ICD-10-CM | POA: Diagnosis not present

## 2012-09-27 DIAGNOSIS — E782 Mixed hyperlipidemia: Secondary | ICD-10-CM | POA: Diagnosis not present

## 2012-09-27 DIAGNOSIS — I10 Essential (primary) hypertension: Secondary | ICD-10-CM | POA: Diagnosis not present

## 2012-09-27 DIAGNOSIS — Z45018 Encounter for adjustment and management of other part of cardiac pacemaker: Secondary | ICD-10-CM | POA: Diagnosis not present

## 2012-09-27 DIAGNOSIS — I442 Atrioventricular block, complete: Secondary | ICD-10-CM | POA: Diagnosis not present

## 2012-09-29 DIAGNOSIS — Z79899 Other long term (current) drug therapy: Secondary | ICD-10-CM | POA: Diagnosis not present

## 2012-11-05 ENCOUNTER — Encounter: Payer: Self-pay | Admitting: Cardiovascular Disease

## 2012-11-09 DIAGNOSIS — R972 Elevated prostate specific antigen [PSA]: Secondary | ICD-10-CM | POA: Diagnosis not present

## 2012-11-16 DIAGNOSIS — E785 Hyperlipidemia, unspecified: Secondary | ICD-10-CM | POA: Diagnosis not present

## 2012-11-16 DIAGNOSIS — E109 Type 1 diabetes mellitus without complications: Secondary | ICD-10-CM | POA: Diagnosis not present

## 2012-11-16 DIAGNOSIS — I259 Chronic ischemic heart disease, unspecified: Secondary | ICD-10-CM | POA: Diagnosis not present

## 2012-11-16 DIAGNOSIS — C61 Malignant neoplasm of prostate: Secondary | ICD-10-CM | POA: Diagnosis not present

## 2012-11-16 DIAGNOSIS — I10 Essential (primary) hypertension: Secondary | ICD-10-CM | POA: Diagnosis not present

## 2013-01-03 ENCOUNTER — Other Ambulatory Visit: Payer: Self-pay | Admitting: *Deleted

## 2013-01-03 DIAGNOSIS — R0989 Other specified symptoms and signs involving the circulatory and respiratory systems: Secondary | ICD-10-CM

## 2013-01-03 DIAGNOSIS — I10 Essential (primary) hypertension: Secondary | ICD-10-CM | POA: Diagnosis not present

## 2013-01-03 DIAGNOSIS — E782 Mixed hyperlipidemia: Secondary | ICD-10-CM | POA: Diagnosis not present

## 2013-01-03 DIAGNOSIS — Z45018 Encounter for adjustment and management of other part of cardiac pacemaker: Secondary | ICD-10-CM | POA: Diagnosis not present

## 2013-01-03 LAB — PACEMAKER DEVICE OBSERVATION

## 2013-01-04 ENCOUNTER — Other Ambulatory Visit: Payer: Self-pay | Admitting: Cardiovascular Disease

## 2013-01-04 DIAGNOSIS — E119 Type 2 diabetes mellitus without complications: Secondary | ICD-10-CM | POA: Diagnosis not present

## 2013-01-04 DIAGNOSIS — N429 Disorder of prostate, unspecified: Secondary | ICD-10-CM | POA: Diagnosis not present

## 2013-01-04 DIAGNOSIS — E782 Mixed hyperlipidemia: Secondary | ICD-10-CM | POA: Diagnosis not present

## 2013-01-04 DIAGNOSIS — R5383 Other fatigue: Secondary | ICD-10-CM | POA: Diagnosis not present

## 2013-01-04 DIAGNOSIS — R5381 Other malaise: Secondary | ICD-10-CM | POA: Diagnosis not present

## 2013-01-04 DIAGNOSIS — R6889 Other general symptoms and signs: Secondary | ICD-10-CM | POA: Diagnosis not present

## 2013-01-04 LAB — CBC WITH DIFFERENTIAL/PLATELET
Basophils Absolute: 0 10*3/uL (ref 0.0–0.1)
Basophils Relative: 1 % (ref 0–1)
Eosinophils Absolute: 0.2 10*3/uL (ref 0.0–0.7)
Eosinophils Relative: 5 % (ref 0–5)
HCT: 41.8 % (ref 39.0–52.0)
Hemoglobin: 13.6 g/dL (ref 13.0–17.0)
Lymphocytes Relative: 28 % (ref 12–46)
Lymphs Abs: 1.2 10*3/uL (ref 0.7–4.0)
MCH: 24.2 pg — ABNORMAL LOW (ref 26.0–34.0)
MCHC: 32.5 g/dL (ref 30.0–36.0)
MCV: 74.2 fL — ABNORMAL LOW (ref 78.0–100.0)
Monocytes Absolute: 0.5 10*3/uL (ref 0.1–1.0)
Monocytes Relative: 11 % (ref 3–12)
Neutro Abs: 2.3 10*3/uL (ref 1.7–7.7)
Neutrophils Relative %: 55 % (ref 43–77)
Platelets: 298 10*3/uL (ref 150–400)
RBC: 5.63 MIL/uL (ref 4.22–5.81)
RDW: 15.2 % (ref 11.5–15.5)
WBC: 4.2 10*3/uL (ref 4.0–10.5)

## 2013-01-05 LAB — COMPREHENSIVE METABOLIC PANEL
ALT: 15 U/L (ref 0–53)
AST: 16 U/L (ref 0–37)
Albumin: 4.3 g/dL (ref 3.5–5.2)
Alkaline Phosphatase: 63 U/L (ref 39–117)
BUN: 16 mg/dL (ref 6–23)
CO2: 29 mEq/L (ref 19–32)
Calcium: 9.6 mg/dL (ref 8.4–10.5)
Chloride: 103 mEq/L (ref 96–112)
Creat: 0.99 mg/dL (ref 0.50–1.35)
Glucose, Bld: 119 mg/dL — ABNORMAL HIGH (ref 70–99)
Potassium: 4.2 mEq/L (ref 3.5–5.3)
Sodium: 138 mEq/L (ref 135–145)
Total Bilirubin: 0.4 mg/dL (ref 0.3–1.2)
Total Protein: 7.9 g/dL (ref 6.0–8.3)

## 2013-01-05 LAB — LIPID PANEL
Cholesterol: 163 mg/dL (ref 0–200)
HDL: 36 mg/dL — ABNORMAL LOW (ref >39)
LDL Cholesterol: 107 mg/dL — ABNORMAL HIGH (ref 0–99)
Total CHOL/HDL Ratio: 4.5 Ratio
Triglycerides: 99 mg/dL (ref <150)
VLDL: 20 mg/dL (ref 0–40)

## 2013-01-05 LAB — PSA: PSA: <0.01 ng/mL (ref <=4.00)

## 2013-01-05 LAB — TSH: TSH: 2.837 u[IU]/mL (ref 0.350–4.500)

## 2013-01-05 LAB — HEMOGLOBIN A1C
Hgb A1c MFr Bld: 6 % — ABNORMAL HIGH (ref <5.7)
Mean Plasma Glucose: 126 mg/dL — ABNORMAL HIGH (ref <117)

## 2013-01-07 ENCOUNTER — Ambulatory Visit (HOSPITAL_COMMUNITY)
Admission: RE | Admit: 2013-01-07 | Discharge: 2013-01-07 | Disposition: A | Discharge status: Home / Self Care | Payer: Medicare Other | Source: Ambulatory Visit | Attending: Cardiovascular Disease | Admitting: Cardiovascular Disease

## 2013-01-07 DIAGNOSIS — R0989 Other specified symptoms and signs involving the circulatory and respiratory systems: Secondary | ICD-10-CM | POA: Insufficient documentation

## 2013-01-07 NOTE — Progress Notes (Signed)
Carotid Duplex Completed. °Richard Alvarado ° °

## 2013-02-08 DIAGNOSIS — C61 Malignant neoplasm of prostate: Secondary | ICD-10-CM | POA: Diagnosis not present

## 2013-02-16 DIAGNOSIS — I259 Chronic ischemic heart disease, unspecified: Secondary | ICD-10-CM | POA: Diagnosis not present

## 2013-02-16 DIAGNOSIS — I10 Essential (primary) hypertension: Secondary | ICD-10-CM | POA: Diagnosis not present

## 2013-02-16 DIAGNOSIS — E785 Hyperlipidemia, unspecified: Secondary | ICD-10-CM | POA: Diagnosis not present

## 2013-02-16 DIAGNOSIS — E109 Type 1 diabetes mellitus without complications: Secondary | ICD-10-CM | POA: Diagnosis not present

## 2013-02-17 DIAGNOSIS — C61 Malignant neoplasm of prostate: Secondary | ICD-10-CM | POA: Diagnosis not present

## 2013-04-12 ENCOUNTER — Telehealth: Payer: Self-pay | Admitting: Internal Medicine

## 2013-04-12 NOTE — Telephone Encounter (Signed)
Pt has appt with Dr C on 04-19-13/mt

## 2013-04-19 ENCOUNTER — Encounter: Payer: Self-pay | Admitting: Cardiovascular Disease

## 2013-04-19 ENCOUNTER — Ambulatory Visit (INDEPENDENT_AMBULATORY_CARE_PROVIDER_SITE_OTHER): Payer: Medicare Other | Admitting: Cardiovascular Disease

## 2013-04-19 VITALS — BP 160/70 | HR 60 | Ht 69.0 in | Wt 208.0 lb

## 2013-04-19 DIAGNOSIS — I313 Pericardial effusion (noninflammatory): Secondary | ICD-10-CM

## 2013-04-19 DIAGNOSIS — Z8673 Personal history of transient ischemic attack (TIA), and cerebral infarction without residual deficits: Secondary | ICD-10-CM

## 2013-04-19 DIAGNOSIS — I441 Atrioventricular block, second degree: Secondary | ICD-10-CM

## 2013-04-19 DIAGNOSIS — I498 Other specified cardiac arrhythmias: Secondary | ICD-10-CM | POA: Diagnosis not present

## 2013-04-19 DIAGNOSIS — I10 Essential (primary) hypertension: Secondary | ICD-10-CM | POA: Diagnosis not present

## 2013-04-19 DIAGNOSIS — Z950 Presence of cardiac pacemaker: Secondary | ICD-10-CM

## 2013-04-19 DIAGNOSIS — I442 Atrioventricular block, complete: Secondary | ICD-10-CM

## 2013-04-19 DIAGNOSIS — I319 Disease of pericardium, unspecified: Secondary | ICD-10-CM | POA: Diagnosis not present

## 2013-04-19 LAB — MDC_IDC_ENUM_SESS_TYPE_INCLINIC
Battery Voltage: 2.76 V
Brady Statistic RA Percent Paced: 26 %
Brady Statistic RV Percent Paced: 5.2 %
Implantable Pulse Generator Model: 5816
Implantable Pulse Generator Serial Number: 1559772
Lead Channel Impedance Value: 390 Ohm
Lead Channel Impedance Value: 395 Ohm
Lead Channel Impedance Value: 450 Ohm
Lead Channel Pacing Threshold Amplitude: 0.5 V
Lead Channel Pacing Threshold Amplitude: 0.625 V
Lead Channel Pacing Threshold Pulse Width: 0.4 ms
Lead Channel Pacing Threshold Pulse Width: 0.4 ms
Lead Channel Sensing Intrinsic Amplitude: 1.1 mV
Lead Channel Sensing Intrinsic Amplitude: 12 mV
Lead Channel Setting Pacing Amplitude: 0.875
Lead Channel Setting Pacing Amplitude: 2 V
Lead Channel Setting Pacing Pulse Width: 0.4 ms
Lead Channel Setting Sensing Sensitivity: 1 mV

## 2013-04-19 LAB — PACEMAKER DEVICE OBSERVATION

## 2013-04-19 NOTE — Patient Instructions (Addendum)
Your physician recommends that you schedule a follow-up appointment in: 3 months.  

## 2013-04-24 ENCOUNTER — Encounter: Payer: Self-pay | Admitting: Cardiovascular Disease

## 2013-04-24 DIAGNOSIS — O1410 Severe pre-eclampsia, unspecified trimester: Secondary | ICD-10-CM

## 2013-04-24 DIAGNOSIS — Z8673 Personal history of transient ischemic attack (TIA), and cerebral infarction without residual deficits: Secondary | ICD-10-CM

## 2013-04-24 DIAGNOSIS — I313 Pericardial effusion (noninflammatory): Secondary | ICD-10-CM | POA: Insufficient documentation

## 2013-04-24 DIAGNOSIS — I441 Atrioventricular block, second degree: Secondary | ICD-10-CM

## 2013-04-24 DIAGNOSIS — I10 Essential (primary) hypertension: Secondary | ICD-10-CM | POA: Insufficient documentation

## 2013-04-24 DIAGNOSIS — Z950 Presence of cardiac pacemaker: Secondary | ICD-10-CM

## 2013-04-24 HISTORY — DX: Severe pre-eclampsia, unspecified trimester: O14.10

## 2013-04-24 HISTORY — DX: Presence of cardiac pacemaker: Z95.0

## 2013-04-24 HISTORY — DX: Personal history of transient ischemic attack (TIA), and cerebral infarction without residual deficits: Z86.73

## 2013-04-24 HISTORY — DX: Atrioventricular block, second degree: I44.1

## 2013-04-24 NOTE — Assessment & Plan Note (Signed)
Pacemaker check in clinic. Normal device function. Thresholds, sensing, impedances consistent with previous measurements. Device programmed to maximize longevity. No mode switch or high ventricular rates noted. Device programmed at appropriate safety  margins. Histogram distribution appropriate for patient activity level. Device programmed to optimize intrinsic conduction. Estimated longevity 2-4.5 years.  26% atrial pacing, 5% ventricular paced

## 2013-04-24 NOTE — Assessment & Plan Note (Addendum)
Initially high, when I rechecked his blood pressure was in the acceptable range. No changes were made to his medications. Echo shows mild biatrial dilation and left ventricular hypertrophy with diastolic dysfunction (grade 1) but normal systolic function.

## 2013-04-24 NOTE — Progress Notes (Signed)
Patient ID: Richard Alvarado, male   DOB: 1941-05-27, 72 y.o.   MRN: [id number]      Reason for office visit Severe hypertension, pacemaker, history of stroke  Richard Alvarado was previously followed by Richard Alvarado until he recently retired. He has a long-standing history of very severe and hard to control systemic hypertension. At one point he was taking minoxidil but this was stopped for a pericardial effusion. Also has a history of syncope related to high-grade second-degree heart block for which he received a dual chamber permanent pacemaker in 2006. 20 years ago he had a left brain stroke from which she has recovered without sequelae.  Pertinent negatives include the absence of coronary insufficiency by nuclear stress testing in the absence of renal artery stenosis by ultrasonography.  He last saw Richard Alvarado in July. He has no complaints today.   No Known Allergies  Current Outpatient Prescriptions  Medication Sig Dispense Refill  . aliskiren (TEKTURNA) 150 MG tablet Take 150 mg by mouth daily.      Marland Kitchen aspirin 81 MG tablet Take 81 mg by mouth daily.      Marland Kitchen atorvastatin (LIPITOR) 80 MG tablet Take 80 mg by mouth at bedtime.      . cloNIDine (CATAPRES) 0.1 MG tablet Take 0.1 mg by mouth daily.      . furosemide (LASIX) 40 MG tablet Take 40 mg by mouth daily.      Marland Kitchen glyBURIDE-metformin (GLUCOVANCE) 5-500 MG per tablet Take 5-500 tablets by mouth 2 (two) times daily.      Marland Kitchen losartan (COZAAR) 100 MG tablet Take 150 mg by mouth daily.      . Nebivolol HCl (BYSTOLIC) 20 MG TABS Take 20 mg by mouth daily.      . niacin (NIASPAN) 500 MG CR tablet Take 500 mg by mouth at bedtime.       No current facility-administered medications for this visit.    Past Medical History  Diagnosis Date  . Hypertension     RENAL DOPPLER, 03/08/2009 - normal  . CVA (cerebral vascular accident) 1994    Left brain  . Obstructive sleep apnea 09/26/2005    AHI-19.46, during REM-36.88  . Cerebral  atherosclerosis     CAROTID DOPPLER, 09/07/2009 - RIGHT AND LEFT CCAs-small-moderate amount of irregular mixed density plaque, no significant evidence of diameter reduction; RIGHT AND LEFT ICAs-moderate amount irregular mixed density plaque, no evidence of significant diameter reduction  . Pericardial effusion     2D ECHO, 03/07/2011 - EF >70%, normal  . Hypertension in pregnancy, preeclampsia, severe 04/24/2013  . History of CVA (cerebrovascular accident) 04/24/2013  . Second degree heart block 04/24/2013  . Pacemaker St. Jude victory, dual chamber, 2006 04/24/2013    Past Surgical History  Procedure Laterality Date  . Pacemaker insertion  05/09/2005    St. Jude [place], [place] #[id number], serial [id number]  . Nm myoview ltd      Normal sty, no ECG changes, EKG negative for ischemia, post-stress EF 54%    Family History  Problem Relation Age of Onset  . CVA Mother   . Hypertension Sister   . Heart disease Maternal Grandmother     History   Social History  . Marital Status: Married    Spouse Name: N/A    Number of Children: N/A  . Years of Education: N/A   Occupational History  . Not on file.   Social History Main Topics  . Smoking status: Former Smoker  Quit date: 06/09/1999  . Smokeless tobacco: Not on file  . Alcohol Use: No  . Drug Use: No  . Sexual Activity: Not on file   Other Topics Concern  . Not on file   Social History Narrative  . No narrative on file    Review of systems:  The patient specifically denies any chest pain at rest or with exertion, dyspnea at rest or with exertion, orthopnea, paroxysmal nocturnal dyspnea, syncope, palpitations, focal neurological deficits, intermittent claudication, lower extremity edema, unexplained weight gain, cough, hemoptysis or wheezing.  The patient also denies abdominal pain, nausea, vomiting, dysphagia, diarrhea, constipation, polyuria, polydipsia, dysuria, hematuria, frequency, urgency, abnormal bleeding or bruising,  fever, chills, unexpected weight changes, mood swings, change in skin or hair texture, change in voice quality, auditory or visual problems, allergic reactions or rashes, new musculoskeletal complaints other than usual "aches and pains".  PHYSICAL EXAM BP 160/70  Pulse 60  Ht 5\' 9"  (1.753 m)  Wt 208 lb (94.348 kg)  BMI 30.70 kg/m2 Recheck 138/70wq2 General: Alert, oriented x3, no distress Head: no evidence of trauma, PERRL, EOMI, no exophtalmos or lid lag, no myxedema, no xanthelasma; normal ears, nose and oropharynx Neck: normal jugular venous pulsations and no hepatojugular reflux; brisk carotid pulses without delay and no carotid bruits Chest: clear to auscultation, no signs of consolidation by percussion or palpation, normal fremitus, symmetrical and full respiratory excursions Cardiovascular: normal position and quality of the apical impulse, regular rhythm, normal first and second heart sounds, loud S4 no rubs, early peaking systolic ejection murmur in the aortic focus no more than 1-2/6 in intensity Abdomen: no tenderness or distention, no masses by palpation, no abnormal pulsatility or arterial bruits, normal bowel sounds, no hepatosplenomegaly Extremities: no clubbing, cyanosis or edema; 2+ radial, ulnar and brachial pulses bilaterally; 2+ right femoral, posterior tibial and dorsalis pedis pulses; 2+ left femoral, posterior tibial and dorsalis pedis pulses; no subclavian or femoral bruits Neurological: grossly nonfocal   EKG: Atrial paced ventricular sensed, long AV delay, right bundle branch block and left anterior fascicular block (trifascicular block)  Lipid Panel     Component Value Date/Time   CHOL 163 01/04/2013 1133   TRIG 99 01/04/2013 1133   HDL 36* 01/04/2013 1133   CHOLHDL 4.5 01/04/2013 1133   VLDL 20 01/04/2013 1133   LDLCALC 107* 01/04/2013 1133    BMET    Component Value Date/Time   NA 138 01/04/2013 1133   K 4.2 01/04/2013 1133   CL 103 01/04/2013 1133   CO2 29  01/04/2013 1133   GLUCOSE 119* 01/04/2013 1133   BUN 16 01/04/2013 1133   CREATININE 0.99 01/04/2013 1133   CALCIUM 9.6 01/04/2013 1133     ASSESSMENT AND PLAN Pacemaker St. Jude victory, dual chamber, 2006 Pacemaker check in clinic. Normal device function. Thresholds, sensing, impedances consistent with previous measurements. Device programmed to maximize longevity. No mode switch or high ventricular rates noted. Device programmed at appropriate safety  margins. Histogram distribution appropriate for patient activity level. Device programmed to optimize intrinsic conduction. Estimated longevity 2-4.5 years.  26% atrial pacing, 5% ventricular paced  Severe hypertension Initially high, when I rechecked his blood pressure was in the acceptable range. No changes were made to his medications.   Patient Instructions  Your physician recommends that you schedule a follow-up appointment in: 3 months     Orders Placed This Encounter  Procedures  . Implantable device check   Meds ordered this encounter  Medications  . glyBURIDE-metformin (  GLUCOVANCE) 5-500 MG per tablet    Sig: Take 5-500 tablets by mouth 2 (two) times daily.  . Nebivolol HCl (BYSTOLIC) 20 MG TABS    Sig: Take 20 mg by mouth daily.  . furosemide (LASIX) 40 MG tablet    Sig: Take 40 mg by mouth daily.    Junious Silk, MD, [facility] [phone] office [phone] pager

## 2013-05-11 DIAGNOSIS — C61 Malignant neoplasm of prostate: Secondary | ICD-10-CM | POA: Diagnosis not present

## 2013-05-11 DIAGNOSIS — R972 Elevated prostate specific antigen [PSA]: Secondary | ICD-10-CM | POA: Diagnosis not present

## 2013-05-18 DIAGNOSIS — I259 Chronic ischemic heart disease, unspecified: Secondary | ICD-10-CM | POA: Diagnosis not present

## 2013-05-18 DIAGNOSIS — E109 Type 1 diabetes mellitus without complications: Secondary | ICD-10-CM | POA: Diagnosis not present

## 2013-05-18 DIAGNOSIS — I10 Essential (primary) hypertension: Secondary | ICD-10-CM | POA: Diagnosis not present

## 2013-05-18 DIAGNOSIS — G4730 Sleep apnea, unspecified: Secondary | ICD-10-CM | POA: Diagnosis not present

## 2013-05-18 DIAGNOSIS — E785 Hyperlipidemia, unspecified: Secondary | ICD-10-CM | POA: Diagnosis not present

## 2013-05-19 DIAGNOSIS — N529 Male erectile dysfunction, unspecified: Secondary | ICD-10-CM | POA: Diagnosis not present

## 2013-05-19 DIAGNOSIS — C61 Malignant neoplasm of prostate: Secondary | ICD-10-CM | POA: Diagnosis not present

## 2013-07-19 ENCOUNTER — Ambulatory Visit (INDEPENDENT_AMBULATORY_CARE_PROVIDER_SITE_OTHER): Payer: Medicare Other | Admitting: Cardiovascular Disease

## 2013-07-19 ENCOUNTER — Encounter: Payer: Self-pay | Admitting: Cardiovascular Disease

## 2013-07-19 VITALS — BP 151/80 | HR 60 | Resp 16 | Ht 69.0 in | Wt 211.6 lb

## 2013-07-19 DIAGNOSIS — Z950 Presence of cardiac pacemaker: Secondary | ICD-10-CM | POA: Diagnosis not present

## 2013-07-19 DIAGNOSIS — I10 Essential (primary) hypertension: Secondary | ICD-10-CM | POA: Diagnosis not present

## 2013-07-19 DIAGNOSIS — I441 Atrioventricular block, second degree: Secondary | ICD-10-CM | POA: Diagnosis not present

## 2013-07-19 LAB — PACEMAKER DEVICE OBSERVATION

## 2013-07-19 NOTE — Patient Instructions (Addendum)
Your physician recommends that you schedule a follow-up appointment in: 3 months with Dr.Croitoru + pacemaker check.  

## 2013-07-27 ENCOUNTER — Encounter: Payer: Self-pay | Admitting: Cardiovascular Disease

## 2013-07-27 NOTE — Assessment & Plan Note (Signed)
Usually well-controlled, unusually high today. He is already on 4 different antihypertensive medications. The logical thing that would be a low-dose diuretic if this proves to be a consistent problem

## 2013-07-27 NOTE — Progress Notes (Signed)
Patient ID: Richard Alvarado, male   DOB: Oct 09, 1940, 73 y.o.   MRN: [id number]     Reason for office visit Pacemaker follow up, severe HTN  Richard Alvarado has a long-standing history of very severe and hard to control systemic hypertension. At one point he was taking minoxidil but this was stopped for a pericardial effusion. Also has a history of syncope related to high-grade second-degree heart block for which he received a dual chamber permanent pacemaker in 2006. 20 years ago he had a left brain stroke from which she has recovered without sequelae.  Pertinent negatives include the absence of coronary insufficiency by nuclear stress testing in the absence of renal artery stenosis by ultrasonography.  He has no complaints today. His pacemaker is a Biomedical scientist dual chamber device, not amenable to remote monitoring   No Known Allergies  Current Outpatient Prescriptions  Medication Sig Dispense Refill  . aliskiren (TEKTURNA) 150 MG tablet Take 150 mg by mouth daily.      Marland Kitchen aspirin 81 MG tablet Take 81 mg by mouth daily.      Marland Kitchen atorvastatin (LIPITOR) 80 MG tablet Take 80 mg by mouth at bedtime.      . cloNIDine (CATAPRES) 0.1 MG tablet Take 0.1 mg by mouth daily.      . furosemide (LASIX) 40 MG tablet Take 40 mg by mouth daily.      Marland Kitchen glyBURIDE-metformin (GLUCOVANCE) 5-500 MG per tablet Take 5-500 tablets by mouth 2 (two) times daily.      Marland Kitchen losartan (COZAAR) 100 MG tablet Take 100 mg by mouth daily.       . Nebivolol HCl (BYSTOLIC) 20 MG TABS Take 20 mg by mouth daily.      . niacin (NIASPAN) 500 MG CR tablet Take 500 mg by mouth at bedtime.       No current facility-administered medications for this visit.    Past Medical History  Diagnosis Date  . Hypertension     RENAL DOPPLER, 03/08/2009 - normal  . CVA (cerebral vascular accident) 1994    Left brain  . Obstructive sleep apnea 09/26/2005    AHI-19.46, during REM-36.88  . Cerebral atherosclerosis     CAROTID DOPPLER,  09/07/2009 - RIGHT AND LEFT CCAs-small-moderate amount of irregular mixed density plaque, no significant evidence of diameter reduction; RIGHT AND LEFT ICAs-moderate amount irregular mixed density plaque, no evidence of significant diameter reduction  . Pericardial effusion     2D ECHO, 03/07/2011 - EF >70%, normal  . Hypertension in pregnancy, preeclampsia, severe 04/24/2013  . History of CVA (cerebrovascular accident) 04/24/2013  . Second degree heart block 04/24/2013  . Pacemaker St. Jude victory, dual chamber, 2006 04/24/2013    Past Surgical History  Procedure Laterality Date  . Pacemaker insertion  05/09/2005    St. Jude [place], [place] #[id number], serial [id number]  . Nm myoview ltd      Normal sty, no ECG changes, EKG negative for ischemia, post-stress EF 54%    Family History  Problem Relation Age of Onset  . CVA Mother   . Hypertension Sister   . Heart disease Maternal Grandmother     History   Social History  . Marital Status: Married    Spouse Name: N/A    Number of Children: N/A  . Years of Education: N/A   Occupational History  . Not on file.   Social History Main Topics  . Smoking status: Former Smoker    Quit date: 06/09/1999  .  Smokeless tobacco: Not on file  . Alcohol Use: No  . Drug Use: No  . Sexual Activity: Not on file   Other Topics Concern  . Not on file   Social History Narrative  . No narrative on file    Review of systems: The patient specifically denies any chest pain at rest or with exertion, dyspnea at rest or with exertion, orthopnea, paroxysmal nocturnal dyspnea, syncope, palpitations, focal neurological deficits, intermittent claudication, lower extremity edema, unexplained weight gain, cough, hemoptysis or wheezing.  The patient also denies abdominal pain, nausea, vomiting, dysphagia, diarrhea, constipation, polyuria, polydipsia, dysuria, hematuria, frequency, urgency, abnormal bleeding or bruising, fever, chills, unexpected weight changes,  mood swings, change in skin or hair texture, change in voice quality, auditory or visual problems, allergic reactions or rashes, new musculoskeletal complaints other than usual "aches and pains".   PHYSICAL EXAM BP 151/80  Pulse 60  Resp 16  Ht 5\' 9"  (1.753 m)  Wt 95.981 kg (211 lb 9.6 oz)  BMI 31.23 kg/m2 General: Alert, oriented x3, no distress  Head: no evidence of trauma, PERRL, EOMI, no exophtalmos or lid lag, no myxedema, no xanthelasma; normal ears, nose and oropharynx  Neck: normal jugular venous pulsations and no hepatojugular reflux; brisk carotid pulses without delay and no carotid bruits  Chest: clear to auscultation, no signs of consolidation by percussion or palpation, normal fremitus, symmetrical and full respiratory excursions  Cardiovascular: normal position and quality of the apical impulse, regular rhythm, normal first and second heart sounds, loud S4 no rubs, early peaking systolic ejection murmur in the aortic focus no more than 1-2/6 in intensity  Abdomen: no tenderness or distention, no masses by palpation, no abnormal pulsatility or arterial bruits, normal bowel sounds, no hepatosplenomegaly  Extremities: no clubbing, cyanosis or edema; 2+ radial, ulnar and brachial pulses bilaterally; 2+ right femoral, posterior tibial and dorsalis pedis pulses; 2+ left femoral, posterior tibial and dorsalis pedis pulses; no subclavian or femoral bruits  Neurological: grossly nonfocal    EKG: AV sequential pacing  Lipid Panel     Component Value Date/Time   CHOL 163 01/04/2013 1133   TRIG 99 01/04/2013 1133   HDL 36* 01/04/2013 1133   CHOLHDL 4.5 01/04/2013 1133   VLDL 20 01/04/2013 1133   LDLCALC 107* 01/04/2013 1133    BMET    Component Value Date/Time   NA 138 01/04/2013 1133   K 4.2 01/04/2013 1133   CL 103 01/04/2013 1133   CO2 29 01/04/2013 1133   GLUCOSE 119* 01/04/2013 1133   BUN 16 01/04/2013 1133   CREATININE 0.99 01/04/2013 1133   CALCIUM 9.6 01/04/2013 1133      ASSESSMENT AND PLAN Pacemaker St. Jude victory, dual chamber, 2006 Normal device function. This and the longevity of roughly 3-4 years. Excellent leads sensing and pacing parameters. Underlying rhythm today sinus bradycardia 50 beats per minute. There is 34% atrial pacing and roughly 5% ventricular pacing. No episodes of mode switch or other meaningful arrhythmias were recorded.  Severe hypertension Usually well-controlled, unusually high today. He is already on 4 different antihypertensive medications. The logical thing that would be a low-dose diuretic if this proves to be a consistent problem   Patient Instructions  Your physician recommends that you schedule a follow-up appointment in: 3 months with Dr.Schuyler Behan + pacemaker check     Orders Placed This Encounter  Procedures  . EKG 12-Lead   No orders of the defined types were placed in this encounter.    Richard Alvarado  [name], MD, [facility] [phone] office [phone] pager

## 2013-07-27 NOTE — Assessment & Plan Note (Signed)
Normal device function. This and the longevity of roughly 3-4 years. Excellent leads sensing and pacing parameters. Underlying rhythm today sinus bradycardia 50 beats per minute. There is 34% atrial pacing and roughly 5% ventricular pacing. No episodes of mode switch or other meaningful arrhythmias were recorded.

## 2013-08-03 LAB — MDC_IDC_ENUM_SESS_TYPE_INCLINIC
Battery Voltage: 2.79 V
Brady Statistic RA Percent Paced: 34 %
Brady Statistic RV Percent Paced: 5.3 %
Implantable Pulse Generator Model: 5816
Implantable Pulse Generator Serial Number: 1559772
Lead Channel Impedance Value: 344 Ohm
Lead Channel Impedance Value: 421 Ohm
Lead Channel Pacing Threshold Amplitude: 0.5 V
Lead Channel Pacing Threshold Amplitude: 0.5 V
Lead Channel Pacing Threshold Pulse Width: 0.4 ms
Lead Channel Pacing Threshold Pulse Width: 0.4 ms
Lead Channel Sensing Intrinsic Amplitude: 1.1 mV
Lead Channel Sensing Intrinsic Amplitude: 12 mV
Lead Channel Setting Pacing Amplitude: 0.875
Lead Channel Setting Pacing Amplitude: 2 V
Lead Channel Setting Pacing Pulse Width: 0.4 ms
Lead Channel Setting Sensing Sensitivity: 1 mV

## 2013-08-16 DIAGNOSIS — E109 Type 1 diabetes mellitus without complications: Secondary | ICD-10-CM | POA: Diagnosis not present

## 2013-08-16 DIAGNOSIS — I259 Chronic ischemic heart disease, unspecified: Secondary | ICD-10-CM | POA: Diagnosis not present

## 2013-08-16 DIAGNOSIS — E785 Hyperlipidemia, unspecified: Secondary | ICD-10-CM | POA: Diagnosis not present

## 2013-08-16 DIAGNOSIS — G4730 Sleep apnea, unspecified: Secondary | ICD-10-CM | POA: Diagnosis not present

## 2013-08-16 DIAGNOSIS — I10 Essential (primary) hypertension: Secondary | ICD-10-CM | POA: Diagnosis not present

## 2013-11-01 ENCOUNTER — Ambulatory Visit (INDEPENDENT_AMBULATORY_CARE_PROVIDER_SITE_OTHER): Payer: Medicare Other | Admitting: Cardiovascular Disease

## 2013-11-01 ENCOUNTER — Encounter: Payer: Self-pay | Admitting: Cardiovascular Disease

## 2013-11-01 VITALS — BP 165/88 | HR 64 | Resp 16 | Ht 69.0 in | Wt 204.1 lb

## 2013-11-01 DIAGNOSIS — Z950 Presence of cardiac pacemaker: Secondary | ICD-10-CM

## 2013-11-01 DIAGNOSIS — I10 Essential (primary) hypertension: Secondary | ICD-10-CM | POA: Diagnosis not present

## 2013-11-01 DIAGNOSIS — I441 Atrioventricular block, second degree: Secondary | ICD-10-CM | POA: Diagnosis not present

## 2013-11-01 DIAGNOSIS — E7800 Pure hypercholesterolemia, unspecified: Secondary | ICD-10-CM | POA: Insufficient documentation

## 2013-11-01 DIAGNOSIS — Z8673 Personal history of transient ischemic attack (TIA), and cerebral infarction without residual deficits: Secondary | ICD-10-CM

## 2013-11-01 DIAGNOSIS — E785 Hyperlipidemia, unspecified: Secondary | ICD-10-CM | POA: Diagnosis not present

## 2013-11-01 LAB — PACEMAKER DEVICE OBSERVATION

## 2013-11-01 NOTE — Patient Instructions (Signed)
Dr. Sallyanne Kuster recommends that you schedule a follow-up appointment in: 6 Months with St.Jude device check.

## 2013-11-01 NOTE — Assessment & Plan Note (Signed)
Normal device function. His pacemaker is not amenable to remote followup. We'll see him back in the clinic in 6 months. Atrial pacing configuration was changed from unipolar to bipolar today. This added roughly one year of battery longevity.

## 2013-11-01 NOTE — Assessment & Plan Note (Signed)
Also has diabetes mellitus. The target LDL cholesterol less than 100. We'll get lab results from Dr. Luan Pulling.

## 2013-11-01 NOTE — Progress Notes (Signed)
Patient ID: Richard Alvarado, male   DOB: [birth year], 73 y.o.   MRN: [id number]     Reason for office visit Severe hypertension, second degree AV block status post pacemaker, hyperlipidemia  Richard Alvarado returns for a routine evaluation and pacemaker check. He remains physically active and walks most days of the week. He has no complaints of dyspnea, edema, chest discomfort, dizziness, palpitations or syncope. Interrogation of his pacemaker shows normal device function. There is 40% atrial pacing and 12% ventricular pacing. No significant episodes of mode switch have been recorded. The battery longevity was then extended by one year simply by switching the atrial pacing configuration from unipolar to bipolar. Generator change out is anticipated in 3.5-4.0 years.  His blood pressure was severely elevated on arrival today. It only partially improved by the time we completed the exam. He states that at home when he checks it it is no higher than 140/80 and that when he sees Dr. Luan Pulling in his office it is frankly normal. He denies problems with headaches or neurological complaints. He does not know his most recent hemoglobin A1c or lipid profile, but was told that they were "good". He is due to see Dr. Luan Pulling again next month and thinks he will have blood work done then.  He has a long-standing history of very severe and hard to control systemic hypertension. At one point he was taking minoxidil but this was stopped for a pericardial effusion. Also has a history of syncope related to high-grade second-degree heart block for which he received a dual chamber permanent pacemaker in 2006 ([place] [phone]). 20 years ago he had a left brain stroke from which she has recovered without sequelae.  Pertinent negatives include the absence of coronary insufficiency by nuclear stress testing in the absence of renal artery stenosis by ultrasonography.   No Known Allergies  Current Outpatient  Prescriptions  Medication Sig Dispense Refill  . amLODipine (NORVASC) 5 MG tablet Take 7.5 mg by mouth daily.      Marland Kitchen aspirin 81 MG tablet Take 81 mg by mouth daily.      Marland Kitchen atorvastatin (LIPITOR) 80 MG tablet Take 80 mg by mouth at bedtime.      . cloNIDine (CATAPRES) 0.2 MG tablet Take 0.2 mg by mouth 2 (two) times daily.      . furosemide (LASIX) 40 MG tablet Take 40 mg by mouth daily.      Marland Kitchen glyBURIDE-metformin (GLUCOVANCE) 5-500 MG per tablet Take 5-500 tablets by mouth 2 (two) times daily.      Marland Kitchen losartan (COZAAR) 100 MG tablet Take 200 mg by mouth daily.       . nebivolol (BYSTOLIC) 10 MG tablet Take 10 mg by mouth daily.      . niacin (NIASPAN) 500 MG CR tablet Take 500 mg by mouth at bedtime.       No current facility-administered medications for this visit.    Past Medical History  Diagnosis Date  . Hypertension     RENAL DOPPLER, 03/08/2009 - normal  . CVA (cerebral vascular accident) 1994    Left brain  . Obstructive sleep apnea 09/26/2005    AHI-19.46, during REM-36.88  . Cerebral atherosclerosis     CAROTID DOPPLER, 09/07/2009 - RIGHT AND LEFT CCAs-small-moderate amount of irregular mixed density plaque, no significant evidence of diameter reduction; RIGHT AND LEFT ICAs-moderate amount irregular mixed density plaque, no evidence of significant diameter reduction  . Pericardial effusion     2D  ECHO, 03/07/2011 - EF >70%, normal  . Hypertension in pregnancy, preeclampsia, severe 04/24/2013  . History of CVA (cerebrovascular accident) 04/24/2013  . Second degree heart block 04/24/2013  . Pacemaker St[employer] Jude victory, dual chamber, 2006 04/24/2013    Past Surgical History  Procedure Laterality Date  . Pacemaker insertion  05/09/2005    [employer] Jude [place], [place] #[id number], serial [id number]  . Nm myoview ltd      Normal sty, no ECG changes, EKG negative for ischemia, post-stress EF 54%    Family History  Problem Relation Age of Onset  . CVA Mother   . Hypertension Sister   . Heart  disease Maternal Grandmother     History   Social History  . Marital Status: Married    Spouse Name: N/A    Number of Children: N/A  . Years of Education: N/A   Occupational History  . Not on file.   Social History Main Topics  . Smoking status: Former Smoker    Quit date: 06/09/1999  . Smokeless tobacco: Not on file  . Alcohol Use: No  . Drug Use: No  . Sexual Activity: Not on file   Other Topics Concern  . Not on file   Social History Narrative  . No narrative on file    Review of systems: The patient specifically denies any chest pain at rest or with exertion, dyspnea at rest or with exertion, orthopnea, paroxysmal nocturnal dyspnea, syncope, palpitations, focal neurological deficits, intermittent claudication, lower extremity edema, unexplained weight gain, cough, hemoptysis or wheezing.  The patient also denies abdominal pain, nausea, vomiting, dysphagia, diarrhea, constipation, polyuria, polydipsia, dysuria, hematuria, frequency, urgency, abnormal bleeding or bruising, fever, chills, unexpected weight changes, mood swings, change in skin or hair texture, change in voice quality, auditory or visual problems, allergic reactions or rashes, new musculoskeletal complaints other than usual "aches and pains".   PHYSICAL EXAM BP 165/88  Pulse 64  Resp 16  Ht 5\' 9"  (1.753 m)  Wt 204 lb 1.6 oz (92.579 kg)  BMI 30.13 kg/m2  General: Alert, oriented x3, no distress Head: no evidence of trauma, PERRL, EOMI, no exophtalmos or lid lag, no myxedema, no xanthelasma; normal ears, nose and oropharynx Neck: normal jugular venous pulsations and no hepatojugular reflux; brisk carotid pulses without delay and no carotid bruits Chest: clear to auscultation, no signs of consolidation by percussion or palpation, normal fremitus, symmetrical and full respiratory excursions Cardiovascular: normal position and quality of the apical impulse, regular rhythm, normal first and second heart  sounds, loud S4 no rubs, early peaking systolic ejection murmur in the aortic focus no more than 1-2/6 in intensity Abdomen: no tenderness or distention, no masses by palpation, no abnormal pulsatility or arterial bruits, normal bowel sounds, no hepatosplenomegaly Extremities: no clubbing, cyanosis or edema; 2+ radial, ulnar and brachial pulses bilaterally; 2+ right femoral, posterior tibial and dorsalis pedis pulses; 2+ left femoral, posterior tibial and dorsalis pedis pulses; no subclavian or femoral bruits Neurological: grossly nonfocal  Lipid Panel     Component Value Date/Time   CHOL 163 01/04/2013 1133   TRIG 99 01/04/2013 1133   HDL 36* 01/04/2013 1133   CHOLHDL 4.5 01/04/2013 1133   VLDL 20 01/04/2013 1133   LDLCALC 107* 01/04/2013 1133    BMET    Component Value Date/Time   NA 138 01/04/2013 1133   K 4.2 01/04/2013 1133   CL 103 01/04/2013 1133   CO2 29 01/04/2013 1133   GLUCOSE 119* 01/04/2013 1133  BUN 16 01/04/2013 1133   CREATININE 0.99 01/04/2013 1133   CALCIUM 9.6 01/04/2013 1133     ASSESSMENT AND PLAN Pacemaker St. Jude victory, dual chamber, 2006 Normal device function. His pacemaker is not amenable to remote followup. We'll see him back in the clinic in 6 months. Atrial pacing configuration was changed from unipolar to bipolar today. This added roughly one year of battery longevity.  Severe hypertension Blood pressure is elevated today but he chooses to the heavy traffic on his way to the orifice. He states that in Dr. Luan Pulling office and at home his blood pressure is normal. No changes were made to his medications today.  Hyperlipidemia Also has diabetes mellitus. The target LDL cholesterol less than 100. We'll get lab results from Dr. Luan Pulling.  History of CVA (cerebrovascular accident)     No orders of the defined types were placed in this encounter.   Meds ordered this encounter  Medications  . nebivolol (BYSTOLIC) 10 MG tablet    Sig: Take 10 mg by mouth  daily.  . cloNIDine (CATAPRES) 0.2 MG tablet    Sig: Take 0.2 mg by mouth 2 (two) times daily.  Marland Kitchen amLODipine (NORVASC) 5 MG tablet    Sig: Take 7.5 mg by mouth daily.    Teran Daughenbaugh  [name], MD, [facility] [phone] office [phone] pager

## 2013-11-01 NOTE — Assessment & Plan Note (Signed)
Blood pressure is elevated today but he chooses to the heavy traffic on his way to the [place]. He states that in Dr. Luan Pulling office and at home his blood pressure is normal. No changes were made to his medications today.

## 2013-11-03 ENCOUNTER — Telehealth: Payer: Self-pay | Admitting: *Deleted

## 2013-11-03 DIAGNOSIS — E782 Mixed hyperlipidemia: Secondary | ICD-10-CM

## 2013-11-03 DIAGNOSIS — Z79899 Other long term (current) drug therapy: Secondary | ICD-10-CM

## 2013-11-03 NOTE — Telephone Encounter (Signed)
Dr. C review labs done through Dr. Miguel Rota office showing LDL of 151 and last year it was 107.  Was he taking Atorvastatin prior to the draw in March 2015 or not?  Called patient he was not taking at that time but has been on since.  Will get a recheck, order placed and mailed to patient.  Patient and wife voiced understanding.

## 2013-11-07 LAB — MDC_IDC_ENUM_SESS_TYPE_INCLINIC
Battery Voltage: 2.76 V
Brady Statistic RA Percent Paced: 40 %
Brady Statistic RV Percent Paced: 12 %
Implantable Pulse Generator Model: 5816
Implantable Pulse Generator Serial Number: 1559772
Lead Channel Impedance Value: 457 Ohm
Lead Channel Impedance Value: 470 Ohm
Lead Channel Pacing Threshold Amplitude: 0.5 V
Lead Channel Pacing Threshold Amplitude: 0.75 V
Lead Channel Pacing Threshold Pulse Width: 0.4 ms
Lead Channel Pacing Threshold Pulse Width: 0.4 ms
Lead Channel Sensing Intrinsic Amplitude: 0.8 mV
Lead Channel Sensing Intrinsic Amplitude: 12 mV
Lead Channel Setting Pacing Amplitude: 0.875
Lead Channel Setting Pacing Amplitude: 2 V
Lead Channel Setting Pacing Pulse Width: 0.4 ms
Lead Channel Setting Sensing Sensitivity: 1 mV

## 2013-11-09 DIAGNOSIS — E782 Mixed hyperlipidemia: Secondary | ICD-10-CM | POA: Diagnosis not present

## 2013-11-09 DIAGNOSIS — Z79899 Other long term (current) drug therapy: Secondary | ICD-10-CM | POA: Diagnosis not present

## 2013-11-09 LAB — LIPID PANEL
Cholesterol: 112 mg/dL (ref 0–200)
HDL: 33 mg/dL — ABNORMAL LOW (ref >39)
LDL Cholesterol: 55 mg/dL (ref 0–99)
Total CHOL/HDL Ratio: 3.4 Ratio
Triglycerides: 118 mg/dL (ref <150)
VLDL: 24 mg/dL (ref 0–40)

## 2013-11-09 LAB — BASIC METABOLIC PANEL
BUN: 16 mg/dL (ref 6–23)
CO2: 25 mEq/L (ref 19–32)
Calcium: 9.4 mg/dL (ref 8.4–10.5)
Chloride: 99 mEq/L (ref 96–112)
Creat: 1.18 mg/dL (ref 0.50–1.35)
Glucose, Bld: 242 mg/dL — ABNORMAL HIGH (ref 70–99)
Potassium: 3.4 mEq/L — ABNORMAL LOW (ref 3.5–5.3)
Sodium: 136 mEq/L (ref 135–145)

## 2013-11-10 ENCOUNTER — Encounter: Payer: Self-pay | Admitting: Cardiovascular Disease

## 2013-11-10 ENCOUNTER — Telehealth: Payer: Self-pay | Admitting: Cardiovascular Disease

## 2013-11-10 NOTE — Telephone Encounter (Signed)
Returning a call regarding Mr. Hinderer lab results.

## 2013-11-10 NOTE — Telephone Encounter (Signed)
LMTCB regarding lab results.  

## 2013-11-11 ENCOUNTER — Other Ambulatory Visit: Payer: Self-pay | Admitting: *Deleted

## 2013-11-11 MED ORDER — POTASSIUM CHLORIDE ER 10 MEQ PO TBCR: 10.0000 meq | EXTENDED_RELEASE_TABLET | Freq: Every day | ORAL | Status: DC

## 2013-11-11 NOTE — Telephone Encounter (Signed)
Pt. informed of lab results and to start taking 10 meq of potassium and we will recheck labs in 6 weeks

## 2013-11-15 ENCOUNTER — Telehealth: Payer: Self-pay | Admitting: *Deleted

## 2013-11-15 DIAGNOSIS — Z79899 Other long term (current) drug therapy: Secondary | ICD-10-CM

## 2013-11-15 NOTE — Telephone Encounter (Signed)
Spoke with wife and provided results - also provided on 6/5 by JC, LPN. Repeat BMET ordered and lab slip mailed to patient.

## 2013-11-15 NOTE — Telephone Encounter (Signed)
Message copied by Fidel Levy on Tue Nov 15, 2013 12:37 PM ------      Message from: [name]      Created: Thu Nov 10, 2013  8:02 AM       Labs good except K. Please call in KCl 10 mEq daily. Recheck BMET 6 weeks ------

## 2013-11-16 DIAGNOSIS — G4730 Sleep apnea, unspecified: Secondary | ICD-10-CM | POA: Diagnosis not present

## 2013-11-16 DIAGNOSIS — I259 Chronic ischemic heart disease, unspecified: Secondary | ICD-10-CM | POA: Diagnosis not present

## 2013-11-16 DIAGNOSIS — E109 Type 1 diabetes mellitus without complications: Secondary | ICD-10-CM | POA: Diagnosis not present

## 2013-11-16 DIAGNOSIS — I10 Essential (primary) hypertension: Secondary | ICD-10-CM | POA: Diagnosis not present

## 2013-11-16 DIAGNOSIS — C61 Malignant neoplasm of prostate: Secondary | ICD-10-CM | POA: Diagnosis not present

## 2013-11-16 DIAGNOSIS — E785 Hyperlipidemia, unspecified: Secondary | ICD-10-CM | POA: Diagnosis not present

## 2013-11-24 DIAGNOSIS — C61 Malignant neoplasm of prostate: Secondary | ICD-10-CM | POA: Diagnosis not present

## 2013-11-24 DIAGNOSIS — N529 Male erectile dysfunction, unspecified: Secondary | ICD-10-CM | POA: Diagnosis not present

## 2013-12-26 DIAGNOSIS — Z79899 Other long term (current) drug therapy: Secondary | ICD-10-CM | POA: Diagnosis not present

## 2013-12-26 LAB — BASIC METABOLIC PANEL
BUN: 18 mg/dL (ref 6–23)
CO2: 29 mEq/L (ref 19–32)
Calcium: 9.5 mg/dL (ref 8.4–10.5)
Chloride: 101 mEq/L (ref 96–112)
Creat: 1.41 mg/dL — ABNORMAL HIGH (ref 0.50–1.35)
Glucose, Bld: 142 mg/dL — ABNORMAL HIGH (ref 70–99)
Potassium: 3.9 mEq/L (ref 3.5–5.3)
Sodium: 138 mEq/L (ref 135–145)

## 2013-12-27 ENCOUNTER — Telehealth: Payer: Self-pay | Admitting: *Deleted

## 2013-12-27 DIAGNOSIS — Z79899 Other long term (current) drug therapy: Secondary | ICD-10-CM

## 2013-12-27 NOTE — Telephone Encounter (Signed)
Lab results called to wife.  Voiced understanding.  Will get BMP rechecked in 1-2 months.  Order placed and mailed to patient.

## 2013-12-27 NOTE — Telephone Encounter (Signed)
Message copied by Tressa Busman on Tue Dec 27, 2013  4:02 PM ------      Message from: [name]      Created: Tue Dec 27, 2013 11:24 AM       Potassium is now good, but kidney function a little off. Please recheck in 1-2 months. Hopefully was just a little dehydrated ------

## 2014-02-16 DIAGNOSIS — E785 Hyperlipidemia, unspecified: Secondary | ICD-10-CM | POA: Diagnosis not present

## 2014-02-16 DIAGNOSIS — I10 Essential (primary) hypertension: Secondary | ICD-10-CM | POA: Diagnosis not present

## 2014-02-16 DIAGNOSIS — J449 Chronic obstructive pulmonary disease, unspecified: Secondary | ICD-10-CM | POA: Diagnosis not present

## 2014-02-16 DIAGNOSIS — E109 Type 1 diabetes mellitus without complications: Secondary | ICD-10-CM | POA: Diagnosis not present

## 2014-02-24 DIAGNOSIS — Z79899 Other long term (current) drug therapy: Secondary | ICD-10-CM | POA: Diagnosis not present

## 2014-02-24 LAB — BASIC METABOLIC PANEL
BUN: 17 mg/dL (ref 6–23)
CO2: 23 mEq/L (ref 19–32)
Calcium: 9.8 mg/dL (ref 8.4–10.5)
Chloride: 102 mEq/L (ref 96–112)
Creat: 1.29 mg/dL (ref 0.50–1.35)
Glucose, Bld: 141 mg/dL — ABNORMAL HIGH (ref 70–99)
Potassium: 3.3 mEq/L — ABNORMAL LOW (ref 3.5–5.3)
Sodium: 137 mEq/L (ref 135–145)

## 2014-02-27 ENCOUNTER — Telehealth: Payer: Self-pay | Admitting: *Deleted

## 2014-02-27 DIAGNOSIS — Z79899 Other long term (current) drug therapy: Secondary | ICD-10-CM

## 2014-02-27 MED ORDER — POTASSIUM CHLORIDE ER 10 MEQ PO TBCR: 10.0000 meq | EXTENDED_RELEASE_TABLET | Freq: Three times a day (TID) | ORAL | Status: DC

## 2014-02-27 NOTE — Telephone Encounter (Signed)
Patient notified to increase potassium to TID and recheck labs in 1 month   Med refilled for updated quantity and lab slip mailed to patient

## 2014-02-27 NOTE — Telephone Encounter (Signed)
Message copied by Fidel Levy on Mon Feb 27, 2014  1:18 PM ------      Message from: [name]      Created: Fri Feb 24, 2014 10:03 PM       Kidney function better. K low again. Increase KCl to 10 mEq TID (with each meal) and recheck in 1 month ------

## 2014-03-24 DIAGNOSIS — Z79899 Other long term (current) drug therapy: Secondary | ICD-10-CM | POA: Diagnosis not present

## 2014-03-24 LAB — BASIC METABOLIC PANEL
BUN: 12 mg/dL (ref 6–23)
CO2: 25 mEq/L (ref 19–32)
Calcium: 10 mg/dL (ref 8.4–10.5)
Chloride: 101 mEq/L (ref 96–112)
Creat: 1.2 mg/dL (ref 0.50–1.35)
Glucose, Bld: 165 mg/dL — ABNORMAL HIGH (ref 70–99)
Potassium: 3.8 mEq/L (ref 3.5–5.3)
Sodium: 137 mEq/L (ref 135–145)

## 2014-04-21 ENCOUNTER — Encounter: Payer: Self-pay | Admitting: *Deleted

## 2014-04-25 ENCOUNTER — Encounter: Payer: Self-pay | Admitting: Cardiovascular Disease

## 2014-04-25 ENCOUNTER — Ambulatory Visit (INDEPENDENT_AMBULATORY_CARE_PROVIDER_SITE_OTHER): Payer: Medicare Other | Admitting: Cardiovascular Disease

## 2014-04-25 VITALS — BP 143/81 | HR 60 | Resp 20 | Ht 69.0 in | Wt 207.0 lb

## 2014-04-25 DIAGNOSIS — I441 Atrioventricular block, second degree: Secondary | ICD-10-CM | POA: Diagnosis not present

## 2014-04-25 DIAGNOSIS — Z79899 Other long term (current) drug therapy: Secondary | ICD-10-CM | POA: Diagnosis not present

## 2014-04-25 DIAGNOSIS — E119 Type 2 diabetes mellitus without complications: Secondary | ICD-10-CM

## 2014-04-25 DIAGNOSIS — E785 Hyperlipidemia, unspecified: Secondary | ICD-10-CM

## 2014-04-25 DIAGNOSIS — Z8673 Personal history of transient ischemic attack (TIA), and cerebral infarction without residual deficits: Secondary | ICD-10-CM | POA: Diagnosis not present

## 2014-04-25 DIAGNOSIS — Z950 Presence of cardiac pacemaker: Secondary | ICD-10-CM

## 2014-04-25 DIAGNOSIS — I10 Essential (primary) hypertension: Secondary | ICD-10-CM | POA: Diagnosis not present

## 2014-04-25 LAB — MDC_IDC_ENUM_SESS_TYPE_INCLINIC
Battery Voltage: 2.72 V
Brady Statistic RA Percent Paced: 45 %
Brady Statistic RV Percent Paced: 11 %
Implantable Pulse Generator Model: 5816
Implantable Pulse Generator Serial Number: 1559772
Lead Channel Impedance Value: 453 Ohm
Lead Channel Impedance Value: 495 Ohm
Lead Channel Pacing Threshold Amplitude: 0.5 V
Lead Channel Pacing Threshold Amplitude: 0.75 V
Lead Channel Pacing Threshold Pulse Width: 0.4 ms
Lead Channel Pacing Threshold Pulse Width: 0.4 ms
Lead Channel Sensing Intrinsic Amplitude: 1.6 mV
Lead Channel Sensing Intrinsic Amplitude: 12 mV
Lead Channel Setting Pacing Amplitude: 1 V
Lead Channel Setting Pacing Amplitude: 2 V
Lead Channel Setting Pacing Pulse Width: 0.4 ms
Lead Channel Setting Sensing Sensitivity: 1 mV

## 2014-04-25 NOTE — Patient Instructions (Addendum)
Your physician recommends that you schedule a follow-up appointment with Dr.Croitoru  in 6 months with a Device check in office.   Your physician recommends that you return for lab work

## 2014-04-26 ENCOUNTER — Encounter: Payer: Self-pay | Admitting: Cardiovascular Disease

## 2014-04-26 NOTE — Progress Notes (Signed)
Patient ID: Richard Alvarado, male   DOB: [birth year], 73 y.o.   MRN: [id number]      Reason for office visit Severe hypertension, second degree AV block status post pacemaker, hyperlipidemia  Richard Alvarado returns for a routine evaluation and pacemaker check. He remains physically active and walks most days of the week. He has no complaints of dyspnea, edema, chest discomfort, dizziness, palpitations or syncope. Interrogation of his pacemaker shows normal device function. There is 45% atrial pacing and 11% ventricular pacing. No significant episodes of mode switch have been recorded. The battery longevity is estimated at about 2 years.  His blood pressure was mildly elevated on arrival today. It improved by the time we completed the exam. He again reports his BP is always in normal range when he sees Dr. Luan Pulling in his office. He dislikes driving in [place]. He denies problems with headaches or neurological complaints. \ He has a long-standing history of very severe and hard to control systemic hypertension. At one point he was taking minoxidil but this was stopped for a pericardial effusion. Also has a history of syncope related to high-grade second-degree heart block for which he received a dual chamber permanent pacemaker in 2006 ([place] [id number]). 20 years ago he had a left brain stroke from which she has recovered without sequelae.  Pertinent negatives include the absence of coronary insufficiency by nuclear stress testing in the absence of renal artery stenosis by ultrasonography.   No Known Allergies  Current Outpatient Prescriptions  Medication Sig Dispense Refill  . amLODipine (NORVASC) 5 MG tablet Take 7.5 mg by mouth daily.    Marland Kitchen aspirin 81 MG tablet Take 81 mg by mouth daily.    Marland Kitchen atorvastatin (LIPITOR) 80 MG tablet Take 80 mg by mouth at bedtime.    . cloNIDine (CATAPRES) 0.2 MG tablet Take 0.2 mg by mouth 2 (two) times daily.    . furosemide (LASIX) 40 MG tablet  Take 40 mg by mouth daily.    Marland Kitchen losartan (COZAAR) 100 MG tablet Take 200 mg by mouth daily.     . nebivolol (BYSTOLIC) 10 MG tablet Take 10 mg by mouth daily.    . niacin (NIASPAN) 500 MG CR tablet Take 500 mg by mouth at bedtime.    . potassium chloride (K-DUR) 10 MEQ tablet Take 1 tablet (10 mEq total) by mouth 3 (three) times daily. 270 tablet 2  . glipiZIDE-metformin (METAGLIP) 5-500 MG per tablet Take 2 tablets by mouth 2 (two) times daily.  11   No current facility-administered medications for this visit.    Past Medical History  Diagnosis Date  . Hypertension     RENAL DOPPLER, 03/08/2009 - normal  . CVA (cerebral vascular accident) 1994    Left brain  . Obstructive sleep apnea 09/26/2005    AHI-19.46, during REM-36.88  . Cerebral atherosclerosis     CAROTID DOPPLER, 09/07/2009 - RIGHT AND LEFT CCAs-small-moderate amount of irregular mixed density plaque, no significant evidence of diameter reduction; RIGHT AND LEFT ICAs-moderate amount irregular mixed density plaque, no evidence of significant diameter reduction  . Pericardial effusion     2D ECHO, 03/07/2011 - EF >70%, normal  . Hypertension in pregnancy, preeclampsia, severe 04/24/2013  . History of CVA (cerebrovascular accident) 04/24/2013  . Second degree heart block 04/24/2013  . Pacemaker St. Jude victory, dual chamber, 2006 04/24/2013    Past Surgical History  Procedure Laterality Date  . Pacemaker insertion  05/09/2005    St.  Jude [place], [place] [address][id number], serial [id number]  . Nm myoview ltd  03/31/2012    Normal study, no ECG changes, EKG negative for ischemia, post-stress EF 54%    Family History  Problem Relation Age of Onset  . CVA Mother   . Hypertension Sister   . Heart disease Maternal Grandmother     History   Social History  . Marital Status: Married    Spouse Name: N/A    Number of Children: N/A  . Years of Education: N/A   Occupational History  . Not on file.   Social History Main Topics  .  Smoking status: Former Smoker    Quit date: 06/09/1999  . Smokeless tobacco: Not on file  . Alcohol Use: No  . Drug Use: No  . Sexual Activity: Not on file   Other Topics Concern  . Not on file   Social History Narrative    Review of systems: The patient specifically denies any chest pain at rest or with exertion, dyspnea at rest or with exertion, orthopnea, paroxysmal nocturnal dyspnea, syncope, palpitations, focal neurological deficits, intermittent claudication, lower extremity edema, unexplained weight gain, cough, hemoptysis or wheezing.  The patient also denies abdominal pain, nausea, vomiting, dysphagia, diarrhea, constipation, polyuria, polydipsia, dysuria, hematuria, frequency, urgency, abnormal bleeding or bruising, fever, chills, unexpected weight changes, mood swings, change in skin or hair texture, change in voice quality, auditory or visual problems, allergic reactions or rashes, new musculoskeletal complaints other than usual "aches and pains".   PHYSICAL EXAM BP 143/81 mmHg  Pulse 60  Resp 20  Ht _0  (1.753 m)  Wt 207 lb (93.895 kg)  BMI 30.55 kg/m2 General: Alert, oriented x3, no distress Head: no evidence of trauma, PERRL, EOMI, no exophtalmos or lid lag, no myxedema, no xanthelasma; normal ears, nose and oropharynx Neck: normal jugular venous pulsations and no hepatojugular reflux; brisk carotid pulses without delay and no carotid bruits Chest: clear to auscultation, no signs of consolidation by percussion or palpation, normal fremitus, symmetrical and full respiratory excursions Cardiovascular: normal position and quality of the apical impulse, regular rhythm, normal first and second heart sounds, loud S4 no rubs, early peaking systolic ejection murmur in the aortic focus no more than 1-2/6 in intensity Abdomen: no tenderness or distention, no masses by palpation, no abnormal pulsatility or arterial bruits, normal bowel sounds, no hepatosplenomegaly Extremities:  no clubbing, cyanosis or edema; 2+ radial, ulnar and brachial pulses bilaterally; 2+ right femoral, posterior tibial and dorsalis pedis pulses; 2+ left femoral, posterior tibial and dorsalis pedis pulses; no subclavian or femoral bruits Neurological: grossly nonfocal  EKG: Sinus bradycardia 58 bpm, RBBB + LAFB  Lipid Panel     Component Value Date/Time   CHOL 112 11/09/2013 1027   TRIG 118 11/09/2013 1027   HDL 33* 11/09/2013 1027   CHOLHDL 3.4 11/09/2013 1027   VLDL 24 11/09/2013 1027   LDLCALC 55 11/09/2013 1027    BMET    Component Value Date/Time   NA 137 03/24/2014 1308   K 3.8 03/24/2014 1308   CL 101 03/24/2014 1308   CO2 25 03/24/2014 1308   GLUCOSE 165* 03/24/2014 1308   BUN 12 03/24/2014 1308   CREATININE 1.20 03/24/2014 1308   CALCIUM 10.0 03/24/2014 1308     ASSESSMENT AND PLAN Pacemaker St. Jude victory, dual chamber, 2006 Normal device function. His pacemaker is not amenable to remote followup. We'll see him back in the clinic in 6 months.  Severe hypertension Blood pressure is only  borderline elevated today, usually normal.  Hyperlipidemia Also has diabetes mellitus. At target LDL cholesterol less than 100 History of CVA (cerebrovascular accident)  Orders Placed This Encounter  Procedures  . Lipid Profile  . Comp Met (CMET)  . HgB A1c  . Implantable device check  . EKG 12-Lead   Meds ordered this encounter  Medications  . glipiZIDE-metformin (METAGLIP) 5-500 MG per tablet    Sig: Take 2 tablets by mouth 2 (two) times daily.    Refill:  [address], MD, [facility] [phone] office [phone] pager

## 2014-04-27 ENCOUNTER — Other Ambulatory Visit: Payer: Self-pay | Admitting: Cardiovascular Disease

## 2014-04-27 DIAGNOSIS — Z79899 Other long term (current) drug therapy: Secondary | ICD-10-CM | POA: Diagnosis not present

## 2014-04-27 DIAGNOSIS — E785 Hyperlipidemia, unspecified: Secondary | ICD-10-CM | POA: Diagnosis not present

## 2014-04-27 DIAGNOSIS — E119 Type 2 diabetes mellitus without complications: Secondary | ICD-10-CM | POA: Diagnosis not present

## 2014-04-28 LAB — COMPREHENSIVE METABOLIC PANEL
ALT: 12 U/L (ref 0–53)
ALT: 12 U/L (ref 0–53)
AST: 15 U/L (ref 0–37)
AST: 14 U/L (ref 0–37)
Albumin: 3.9 g/dL (ref 3.5–5.2)
Albumin: 4.1 g/dL (ref 3.5–5.2)
Alkaline Phosphatase: 90 U/L (ref 39–117)
Alkaline Phosphatase: 84 U/L (ref 39–117)
BUN: 13 mg/dL (ref 6–23)
BUN: 13 mg/dL (ref 6–23)
CO2: 24 mEq/L (ref 19–32)
CO2: 25 mEq/L (ref 19–32)
Calcium: 9.3 mg/dL (ref 8.4–10.5)
Calcium: 9.4 mg/dL (ref 8.4–10.5)
Chloride: 103 mEq/L (ref 96–112)
Chloride: 104 mEq/L (ref 96–112)
Creat: 1.02 mg/dL (ref 0.50–1.35)
Creat: 1.02 mg/dL (ref 0.50–1.35)
Glucose, Bld: 126 mg/dL — ABNORMAL HIGH (ref 70–99)
Glucose, Bld: 136 mg/dL — ABNORMAL HIGH (ref 70–99)
Potassium: 4 mEq/L (ref 3.5–5.3)
Potassium: 4 mEq/L (ref 3.5–5.3)
Sodium: 138 mEq/L (ref 135–145)
Sodium: 138 mEq/L (ref 135–145)
Total Bilirubin: 0.3 mg/dL (ref 0.2–1.2)
Total Bilirubin: 0.3 mg/dL (ref 0.2–1.2)
Total Protein: 7.9 g/dL (ref 6.0–8.3)
Total Protein: 7.8 g/dL (ref 6.0–8.3)

## 2014-04-28 LAB — LIPID PANEL
Cholesterol: 109 mg/dL (ref 0–200)
Cholesterol: 116 mg/dL (ref 0–200)
HDL: 36 mg/dL — ABNORMAL LOW (ref >39)
HDL: 36 mg/dL — ABNORMAL LOW (ref >39)
LDL Cholesterol: 59 mg/dL (ref 0–99)
LDL Cholesterol: 67 mg/dL (ref 0–99)
Total CHOL/HDL Ratio: 3 Ratio
Total CHOL/HDL Ratio: 3.2 Ratio
Triglycerides: 70 mg/dL (ref <150)
Triglycerides: 65 mg/dL (ref <150)
VLDL: 14 mg/dL (ref 0–40)
VLDL: 13 mg/dL (ref 0–40)

## 2014-04-28 LAB — HEMOGLOBIN A1C
Hgb A1c MFr Bld: 7 % — ABNORMAL HIGH (ref <5.7)
Mean Plasma Glucose: 154 mg/dL — ABNORMAL HIGH (ref <117)

## 2014-05-08 ENCOUNTER — Encounter: Payer: Self-pay | Admitting: Cardiovascular Disease

## 2014-05-23 DIAGNOSIS — G4730 Sleep apnea, unspecified: Secondary | ICD-10-CM | POA: Diagnosis not present

## 2014-05-23 DIAGNOSIS — I2510 Atherosclerotic heart disease of native coronary artery without angina pectoris: Secondary | ICD-10-CM | POA: Diagnosis not present

## 2014-05-23 DIAGNOSIS — E1165 Type 2 diabetes mellitus with hyperglycemia: Secondary | ICD-10-CM | POA: Diagnosis not present

## 2014-05-23 DIAGNOSIS — I10 Essential (primary) hypertension: Secondary | ICD-10-CM | POA: Diagnosis not present

## 2014-09-22 DIAGNOSIS — G4730 Sleep apnea, unspecified: Secondary | ICD-10-CM | POA: Diagnosis not present

## 2014-09-22 DIAGNOSIS — I2510 Atherosclerotic heart disease of native coronary artery without angina pectoris: Secondary | ICD-10-CM | POA: Diagnosis not present

## 2014-09-22 DIAGNOSIS — E1165 Type 2 diabetes mellitus with hyperglycemia: Secondary | ICD-10-CM | POA: Diagnosis not present

## 2014-09-22 DIAGNOSIS — I10 Essential (primary) hypertension: Secondary | ICD-10-CM | POA: Diagnosis not present

## 2014-10-12 ENCOUNTER — Telehealth: Payer: Self-pay | Admitting: *Deleted

## 2014-10-12 NOTE — Telephone Encounter (Signed)
Richard Alvarado came to the office today thinking he had an appt with Dr. Loletha Grayer for a pacemaker check.  His appt is actually in June.  His device is not compatible with the remote transmitter in the office per [place] but he was offered to go to the [employer] location to have a download done.  Patient declined and said he will wait until his June appt.  Apologized for patients' inconvenience.

## 2014-11-14 ENCOUNTER — Ambulatory Visit (INDEPENDENT_AMBULATORY_CARE_PROVIDER_SITE_OTHER): Payer: Medicare Other | Admitting: Cardiovascular Disease

## 2014-11-14 ENCOUNTER — Encounter: Payer: Self-pay | Admitting: Cardiovascular Disease

## 2014-11-14 VITALS — BP 138/88 | HR 64 | Ht 69.0 in | Wt 211.3 lb

## 2014-11-14 DIAGNOSIS — I10 Essential (primary) hypertension: Secondary | ICD-10-CM

## 2014-11-14 DIAGNOSIS — I441 Atrioventricular block, second degree: Secondary | ICD-10-CM

## 2014-11-14 DIAGNOSIS — Z7901 Long term (current) use of anticoagulants: Secondary | ICD-10-CM

## 2014-11-14 DIAGNOSIS — R5383 Other fatigue: Secondary | ICD-10-CM

## 2014-11-14 DIAGNOSIS — Z79899 Other long term (current) drug therapy: Secondary | ICD-10-CM

## 2014-11-14 DIAGNOSIS — Z45010 Encounter for checking and testing of cardiac pacemaker pulse generator [battery]: Secondary | ICD-10-CM | POA: Diagnosis not present

## 2014-11-14 DIAGNOSIS — E785 Hyperlipidemia, unspecified: Secondary | ICD-10-CM

## 2014-11-14 DIAGNOSIS — Z950 Presence of cardiac pacemaker: Secondary | ICD-10-CM

## 2014-11-14 LAB — CUP PACEART INCLINIC DEVICE CHECK
Battery Impedance: 9700 Ohm
Battery Voltage: 2.72 V
Brady Statistic RA Percent Paced: 16 %
Brady Statistic RV Percent Paced: 2.9 %
Date Time Interrogation Session: 20160607124705
Lead Channel Impedance Value: 424 Ohm
Lead Channel Impedance Value: 434 Ohm
Lead Channel Pacing Threshold Amplitude: 0.5 V
Lead Channel Pacing Threshold Amplitude: 1 V
Lead Channel Pacing Threshold Pulse Width: 0.4 ms
Lead Channel Pacing Threshold Pulse Width: 0.4 ms
Lead Channel Sensing Intrinsic Amplitude: 1.8 mV
Lead Channel Sensing Intrinsic Amplitude: 12 mV
Lead Channel Setting Pacing Amplitude: 2 V
Lead Channel Setting Pacing Amplitude: 2 V
Lead Channel Setting Pacing Pulse Width: 0.4 ms
Lead Channel Setting Sensing Sensitivity: 1 mV
Pulse Gen Model: 5816
Pulse Gen Serial Number: 1559772

## 2014-11-14 NOTE — Progress Notes (Signed)
Patient ID: Richard Alvarado, male   DOB: 05/30/1941, 74 y.o.   MRN: [id number]     Cardiology Office Note   Date:  11/14/2014   ID:  Richard Alvarado, DOB 10/07/1940, MRN [id number]  PCP:  Alonza Bogus, MD  Cardiologist:   [name], MD   Chief Complaint  Patient presents with  . ROV 6 months    patient reports no complaints      History of Present Illness: [name] is a 74 y.o. male who presents for follow-up on severe systemic hypertension and pacemaker check. His dual-chamber St. Jude victory pacemaker was implanted in 2006 for second-degree AV block and syncope. He has rather unexpectedly reached elective replacement indicator. Battery voltage is still fair, but battery impedance suddenly jumped to 9.7 kiloohm. This actually happened in March. He has not noticed any changes in stamina, breathing. His blood pressure control has been good. Glycemic control has been adequate per his report (his most recent hemoglobin A1c from November 2015 was 7.0%). His last lipid profile last November was excellent.  Device interrogation shows good lead parameters. The atrial lead sensed P waves are 1.8 mV, impedance 424 ohm, threshold 0.5 V at 0.4 ms. Ventricular lead sensed R waves are greater than 12 mV, impedance 434 ohm, threshold 1 V at 0.4 ms. He has 16% atrial pacing and 3% ventricular pacing. No atrial tachyarrhythmia recorded.  He has no complaints.  He has a long-standing history of very severe and hard to control systemic hypertension. At one point he was taking minoxidil but this was stopped for a pericardial effusion. 20 years ago he had a left brain stroke from which she has recovered without sequelae.  Pertinent negatives include the absence of coronary insufficiency by nuclear stress testing and the absence of renal artery stenosis by ultrasonography.   Past Medical History  Diagnosis Date  . Hypertension     RENAL DOPPLER, 03/08/2009 - normal  . CVA (cerebral  vascular accident) 1994    Left brain  . Obstructive sleep apnea 09/26/2005    AHI-19.46, during REM-36.88  . Cerebral atherosclerosis     CAROTID DOPPLER, 09/07/2009 - RIGHT AND LEFT CCAs-small-moderate amount of irregular mixed density plaque, no significant evidence of diameter reduction; RIGHT AND LEFT ICAs-moderate amount irregular mixed density plaque, no evidence of significant diameter reduction  . Pericardial effusion     2D ECHO, 03/07/2011 - EF >70%, normal  . History of CVA (cerebrovascular accident) 04/24/2013  . Second degree heart block 04/24/2013  . Pacemaker St. Jude victory, dual chamber, 2006 04/24/2013    Past Surgical History  Procedure Laterality Date  . Pacemaker insertion  05/09/2005    St. Jude [place], [place] #[id number], serial [phone]  . Nm myoview ltd  03/31/2012    Normal study, no ECG changes, EKG negative for ischemia, post-stress EF 54%     Current Outpatient Prescriptions  Medication Sig Dispense Refill  . amLODipine (NORVASC) 5 MG tablet Take 7.5 mg by mouth daily.    Marland Kitchen aspirin 81 MG tablet Take 81 mg by mouth daily.    Marland Kitchen atorvastatin (LIPITOR) 80 MG tablet Take 80 mg by mouth at bedtime.    . cloNIDine (CATAPRES) 0.2 MG tablet Take 0.2 mg by mouth 2 (two) times daily.    . furosemide (LASIX) 40 MG tablet Take 40 mg by mouth daily.    Marland Kitchen glipiZIDE-metformin (METAGLIP) 5-500 MG per tablet Take 2 tablets by mouth 2 (two) times daily.  11  .  losartan (COZAAR) 100 MG tablet Take 200 mg by mouth daily.     . nebivolol (BYSTOLIC) 10 MG tablet Take 10 mg by mouth daily.    . niacin (NIASPAN) 500 MG CR tablet Take 500 mg by mouth at bedtime.    . potassium chloride (K-DUR) 10 MEQ tablet Take 1 tablet (10 mEq total) by mouth 3 (three) times daily. 270 tablet 2   No current facility-administered medications for this visit.    Allergies:   Review of patient's allergies indicates no known allergies.    Social History:  The patient  reports that he quit smoking  about 15 years ago. He does not have any smokeless tobacco history on file. He reports that he does not drink alcohol or use illicit drugs.   Family History:  The patient's family history includes CVA in his mother; Heart disease in his maternal grandmother; Hypertension in his sister.    ROS:  Please see the history of present illness.    Otherwise, review of systems positive for none.   All other systems are reviewed and negative.    PHYSICAL EXAM: VS:  BP 138/88 mmHg  Pulse 64  Ht 5\' 9"  (1.753 m)  Wt 95.845 kg (211 lb 4.8 oz)  BMI 31.19 kg/m2 , BMI Body mass index is 31.19 kg/(m^2).  General: Alert, oriented x3, no distress Head: no evidence of trauma, PERRL, EOMI, no exophtalmos or lid lag, no myxedema, no xanthelasma; normal ears, nose and oropharynx Neck: normal jugular venous pulsations and no hepatojugular reflux; brisk carotid pulses without delay and no carotid bruits Chest: clear to auscultation, no signs of consolidation by percussion or palpation, normal fremitus, symmetrical and full respiratory excursions Cardiovascular: normal position and quality of the apical impulse, regular rhythm, normal first and second heart sounds, no murmurs, rubs or gallops Abdomen: no tenderness or distention, no masses by palpation, no abnormal pulsatility or arterial bruits, normal bowel sounds, no hepatosplenomegaly Extremities: no clubbing, cyanosis or edema; 2+ radial, ulnar and brachial pulses bilaterally; 2+ right femoral, posterior tibial and dorsalis pedis pulses; 2+ left femoral, posterior tibial and dorsalis pedis pulses; no subclavian or femoral bruits Neurological: grossly nonfocal Psych: euthymic mood, full affect   EKG:  EKG is ordered today. The ekg ordered today demonstrates sinus rhythm, right bundle branch block, left anterior fascicular block   Recent Labs: 04/27/2014: ALT 12; ALT 12; BUN 13; BUN 13; Creatinine 1.02; Creatinine 1.02; Potassium 4.0; Potassium 4.0; Sodium  138; Sodium 138    Lipid Panel    Component Value Date/Time   CHOL 116 04/27/2014 0944   CHOL 109 04/27/2014 0944   TRIG 65 04/27/2014 0944   TRIG 70 04/27/2014 0944   HDL 36* 04/27/2014 0944   HDL 36* 04/27/2014 0944   CHOLHDL 3.2 04/27/2014 0944   CHOLHDL 3.0 04/27/2014 0944   VLDL 13 04/27/2014 0944   VLDL 14 04/27/2014 0944   LDLCALC 67 04/27/2014 0944   LDLCALC 59 04/27/2014 0944      Wt Readings from Last 3 Encounters:  11/14/14 95.845 kg (211 lb 4.8 oz)  04/25/14 93.895 kg (207 lb)  11/01/13 92.579 kg (204 lb 1.6 oz)     ASSESSMENT AND PLAN:  Pacemaker St. Jude victory, dual chamber, 2006 reached elective replacement indicator Normal device function otherwise. Discussed generator change out procedure.This procedure has been fully reviewed with the patient and written informed consent has been obtained.  Severe hypertension Blood pressure is fair today, usually normal.   Hyperlipidemia Also  has diabetes mellitus. At target LDL cholesterol less than 100. Due for repeat labs.  History of CVA (cerebrovascular accident)   Current medicines are reviewed at length with the patient today.  The patient does not have concerns regarding medicines.  The following changes have been made:  no change  Labs/ tests ordered today include:  Orders Placed This Encounter  Procedures  . APTT  . Protime-INR  . CBC  . Comprehensive metabolic panel  . Implantable device check  . EKG 12-Lead  . PACEMAKER GENERATOR CHANGE    Patient Instructions    Medication Instructions:  No changes  Labwork:  Today at [facility] for pre-procedure work-up.  Testing/Procedures:  Your physician has recommended that you have a new pacemaker battery inserted. A pacemaker is a small device that is placed under the skin of your chest or abdomen to help control abnormal heart rhythms. This device uses electrical pulses to prompt the heart to beat at a normal rate. Pacemakers are used to  treat heart rhythms that are too slow. Wire (leads) are attached to the pacemaker that goes into the chambers of you heart. This is done in the hospital and usually requires and overnight stay. Please see the instruction sheet given to you today for more information.    Follow-Up:  Instructions will be given after the above procedure.  Any Other Special Instructions Will Be Listed Below (If Applicable).      Mikael Spray, MD  11/14/2014 4:44 PM    [name], MD, [facility] [phone] office [phone] pager

## 2014-11-14 NOTE — Patient Instructions (Addendum)
   Medication Instructions:  No changes  Labwork:  Today at [facility] for pre-procedure work-up.  Testing/Procedures:  Your physician has recommended that you have a new pacemaker battery inserted. A pacemaker is a small device that is placed under the skin of your chest or abdomen to help control abnormal heart rhythms. This device uses electrical pulses to prompt the heart to beat at a normal rate. Pacemakers are used to treat heart rhythms that are too slow. Wire (leads) are attached to the pacemaker that goes into the chambers of you heart. This is done in the hospital and usually requires and overnight stay. Please see the instruction sheet given to you today for more information.    Follow-Up:  Instructions will be given after the above procedure.  Any Other Special Instructions Will Be Listed Below (If Applicable).

## 2014-11-15 ENCOUNTER — Telehealth: Payer: Self-pay | Admitting: Cardiovascular Disease

## 2014-11-15 NOTE — Telephone Encounter (Signed)
  Instruction for generator change out states the need for lab work and CXR.  Patient instructed he does not need a CXR.  Voiced understanding.

## 2014-11-15 NOTE — Telephone Encounter (Signed)
Pt's wife called in about the instructions for the CT scan Dr.C wanted in to have done.

## 2014-11-16 ENCOUNTER — Other Ambulatory Visit: Payer: Self-pay | Admitting: *Deleted

## 2014-11-16 DIAGNOSIS — Z45010 Encounter for checking and testing of cardiac pacemaker pulse generator [battery]: Secondary | ICD-10-CM

## 2014-11-16 LAB — COMPREHENSIVE METABOLIC PANEL
ALT: 16 U/L (ref 0–53)
AST: 19 U/L (ref 0–37)
Albumin: 3.9 g/dL (ref 3.5–5.2)
Alkaline Phosphatase: 82 U/L (ref 39–117)
BUN: 18 mg/dL (ref 6–23)
CO2: 22 mEq/L (ref 19–32)
Calcium: 9.4 mg/dL (ref 8.4–10.5)
Chloride: 104 mEq/L (ref 96–112)
Creat: 1.09 mg/dL (ref 0.50–1.35)
Glucose, Bld: 109 mg/dL — ABNORMAL HIGH (ref 70–99)
Potassium: 3.6 mEq/L (ref 3.5–5.3)
Sodium: 138 mEq/L (ref 135–145)
Total Bilirubin: 0.5 mg/dL (ref 0.2–1.2)
Total Protein: 7.3 g/dL (ref 6.0–8.3)

## 2014-11-16 LAB — CBC
HCT: 39.7 % (ref 39.0–52.0)
Hemoglobin: 12.7 g/dL — ABNORMAL LOW (ref 13.0–17.0)
MCH: 24.1 pg — ABNORMAL LOW (ref 26.0–34.0)
MCHC: 32 g/dL (ref 30.0–36.0)
MCV: 75.5 fL — ABNORMAL LOW (ref 78.0–100.0)
MPV: 9.6 fL (ref 8.6–12.4)
Platelets: 238 10*3/uL (ref 150–400)
RBC: 5.26 MIL/uL (ref 4.22–5.81)
RDW: 15.3 % (ref 11.5–15.5)
WBC: 5.2 10*3/uL (ref 4.0–10.5)

## 2014-11-16 LAB — APTT: aPTT: 34 seconds (ref 24–37)

## 2014-11-16 LAB — PROTIME-INR
INR: 1.21 (ref <1.50)
Prothrombin Time: 15.3 seconds — ABNORMAL HIGH (ref 11.6–15.2)

## 2014-11-17 ENCOUNTER — Other Ambulatory Visit: Payer: Self-pay | Admitting: Cardiovascular Disease

## 2014-11-17 NOTE — Telephone Encounter (Signed)
Can this encounter be closed?

## 2014-11-17 NOTE — Telephone Encounter (Signed)
Med refilled.

## 2014-11-21 ENCOUNTER — Ambulatory Visit (HOSPITAL_COMMUNITY)
Admission: RE | Admit: 2014-11-21 | Discharge: 2014-11-21 | Disposition: A | Discharge status: Home / Self Care | Payer: Medicare Other | Source: Ambulatory Visit | Attending: Cardiovascular Disease | Admitting: Cardiovascular Disease

## 2014-11-21 ENCOUNTER — Encounter (HOSPITAL_COMMUNITY)
Admission: RE | Disposition: A | Discharge status: Home / Self Care | Payer: Self-pay | Source: Ambulatory Visit | Attending: Cardiovascular Disease

## 2014-11-21 ENCOUNTER — Telehealth: Payer: Self-pay | Admitting: Cardiovascular Disease

## 2014-11-21 DIAGNOSIS — I669 Occlusion and stenosis of unspecified cerebral artery: Secondary | ICD-10-CM | POA: Insufficient documentation

## 2014-11-21 DIAGNOSIS — I441 Atrioventricular block, second degree: Secondary | ICD-10-CM

## 2014-11-21 DIAGNOSIS — Z8673 Personal history of transient ischemic attack (TIA), and cerebral infarction without residual deficits: Secondary | ICD-10-CM | POA: Diagnosis not present

## 2014-11-21 DIAGNOSIS — Z87891 Personal history of nicotine dependence: Secondary | ICD-10-CM | POA: Insufficient documentation

## 2014-11-21 DIAGNOSIS — I10 Essential (primary) hypertension: Secondary | ICD-10-CM | POA: Diagnosis not present

## 2014-11-21 DIAGNOSIS — Z950 Presence of cardiac pacemaker: Secondary | ICD-10-CM | POA: Diagnosis present

## 2014-11-21 DIAGNOSIS — Z45010 Encounter for checking and testing of cardiac pacemaker pulse generator [battery]: Secondary | ICD-10-CM | POA: Diagnosis not present

## 2014-11-21 DIAGNOSIS — E785 Hyperlipidemia, unspecified: Secondary | ICD-10-CM | POA: Insufficient documentation

## 2014-11-21 DIAGNOSIS — E119 Type 2 diabetes mellitus without complications: Secondary | ICD-10-CM | POA: Diagnosis not present

## 2014-11-21 DIAGNOSIS — Z7982 Long term (current) use of aspirin: Secondary | ICD-10-CM | POA: Diagnosis not present

## 2014-11-21 DIAGNOSIS — I313 Pericardial effusion (noninflammatory): Secondary | ICD-10-CM | POA: Insufficient documentation

## 2014-11-21 DIAGNOSIS — G4733 Obstructive sleep apnea (adult) (pediatric): Secondary | ICD-10-CM | POA: Insufficient documentation

## 2014-11-21 HISTORY — PX: EP IMPLANTABLE DEVICE: SHX172B

## 2014-11-21 LAB — SURGICAL PCR SCREEN
MRSA, PCR: NEGATIVE
Staphylococcus aureus: POSITIVE — AB

## 2014-11-21 LAB — GLUCOSE, CAPILLARY
Glucose-Capillary: 118 mg/dL — ABNORMAL HIGH (ref 65–99)
Glucose-Capillary: 107 mg/dL — ABNORMAL HIGH (ref 65–99)

## 2014-11-21 SURGERY — PPM/BIV PPM GENERATOR CHANGEOUT
Anesthesia: LOCAL

## 2014-11-21 MED ORDER — CHLORHEXIDINE GLUCONATE 4 % EX LIQD: 60.0000 mL | Freq: Once | CUTANEOUS | Status: DC

## 2014-11-21 MED ORDER — CEFAZOLIN SODIUM-DEXTROSE 2-3 GM-% IV SOLR: 2.0000 g | INTRAVENOUS | Status: DC

## 2014-11-21 MED ORDER — GENTAMICIN SULFATE 40 MG/ML IJ SOLN: 80.0000 mg | INTRAMUSCULAR | Status: DC

## 2014-11-21 MED ORDER — MIDAZOLAM HCL 5 MG/5ML IJ SOLN
INTRAMUSCULAR | Status: AC
  Filled 2014-11-21: qty 5

## 2014-11-21 MED ORDER — FENTANYL CITRATE (PF) 100 MCG/2ML IJ SOLN
INTRAMUSCULAR | Status: AC
  Filled 2014-11-21: qty 2

## 2014-11-21 MED ORDER — MUPIROCIN 2 % EX OINT
TOPICAL_OINTMENT | CUTANEOUS | Status: AC
  Administered 2014-11-21: 1 via TOPICAL
  Filled 2014-11-21: qty 22

## 2014-11-21 MED ORDER — HYDRALAZINE HCL 20 MG/ML IJ SOLN
INTRAMUSCULAR | Status: DC | PRN
Start: 2014-11-21 — End: 2014-11-21
  Administered 2014-11-21: 20 mg via INTRAVENOUS

## 2014-11-21 MED ORDER — SODIUM CHLORIDE 0.9 % IR SOLN
Status: AC
  Filled 2014-11-21: qty 2

## 2014-11-21 MED ORDER — CLONIDINE HCL 0.2 MG PO TABS
0.2000 mg | ORAL_TABLET | ORAL | Status: AC
  Administered 2014-11-21: 0.2 mg via ORAL
  Filled 2014-11-21: qty 1

## 2014-11-21 MED ORDER — MIDAZOLAM HCL 5 MG/5ML IJ SOLN
INTRAMUSCULAR | Status: DC | PRN
  Administered 2014-11-21: 2 mg via INTRAVENOUS

## 2014-11-21 MED ORDER — SODIUM CHLORIDE 0.9 % IJ SOLN: 3.0000 mL | INTRAMUSCULAR | Status: DC | PRN

## 2014-11-21 MED ORDER — HYDRALAZINE HCL 20 MG/ML IJ SOLN
INTRAMUSCULAR | Status: AC
  Filled 2014-11-21: qty 1

## 2014-11-21 MED ORDER — CEFAZOLIN SODIUM-DEXTROSE 2-3 GM-% IV SOLR
INTRAVENOUS | Status: AC
  Filled 2014-11-21: qty 50

## 2014-11-21 MED ORDER — FENTANYL CITRATE (PF) 100 MCG/2ML IJ SOLN
INTRAMUSCULAR | Status: DC | PRN
  Administered 2014-11-21: 50 ug via INTRAVENOUS

## 2014-11-21 MED ORDER — LIDOCAINE HCL (PF) 1 % IJ SOLN
INTRAMUSCULAR | Status: DC | PRN
  Administered 2014-11-21: 25 mL via INTRADERMAL

## 2014-11-21 MED ORDER — CEFAZOLIN SODIUM-DEXTROSE 2-3 GM-% IV SOLR
INTRAVENOUS | Status: DC | PRN
  Administered 2014-11-21: 2 g via INTRAVENOUS

## 2014-11-21 MED ORDER — LIDOCAINE HCL (PF) 1 % IJ SOLN
INTRAMUSCULAR | Status: AC
  Filled 2014-11-21: qty 60

## 2014-11-21 MED ORDER — MUPIROCIN 2 % EX OINT
1.0000 "application " | TOPICAL_OINTMENT | Freq: Once | CUTANEOUS | Status: AC
  Administered 2014-11-21: 1 via TOPICAL

## 2014-11-21 MED ORDER — SODIUM CHLORIDE 0.9 % IV SOLN
INTRAVENOUS | Status: DC
  Administered 2014-11-21: 12:00:00 via INTRAVENOUS

## 2014-11-21 SURGICAL SUPPLY — 4 items
CABLE SURGICAL S-101-97-12 (CABLE) ×1 IMPLANT
PAD DEFIB LIFELINK (PAD) ×1 IMPLANT
PPM ASSURITY DR PM2240 (Pacemaker) ×1 IMPLANT
TRAY PACEMAKER INSERTION (CUSTOM PROCEDURE TRAY) ×1 IMPLANT

## 2014-11-21 NOTE — Discharge Instructions (Signed)
Pacemaker Battery Change °A pacemaker battery usually lasts 4 to 12 years. Once or twice per year, you will be asked to visit your health care provider to have a full evaluation of your pacemaker. When a battery needs to be replaced, the entire pacemaker is replaced so that you can benefit from new circuitry and any new features that have been added to pacemakers. Most often, this procedure is very simple because the leads are already in place.  °There are many things that affect how long a pacemaker battery will last, including:  °· The age of the pacemaker.   °· The number of leads (1, 2, or 3).   °· The pacemaker work load. If the pacemaker is helping the heart more often, the battery will not last as long as it would if the pacemaker did not need to help the heart.   °· Power (voltage) settings.  °LET YOUR HEALTH CARE PROVIDER KNOW ABOUT:  °· Any allergies you have.   °· All medicines you are taking, including vitamins, herbs, eye drops, creams, and over-the-counter medicines.   °· Previous problems you or members of your family have had with the use of anesthetics.   °· Any blood disorders you have.   °· Previous surgeries you have had, especially since your last pacemaker placement.   °· Medical conditions you have.   °· Possibility of pregnancy, if this applies. °· Symptoms of chest pain, trouble breathing, palpitations, light-headedness, or feelings of an abnormal or irregular heartbeat. °RISKS AND COMPLICATIONS  °Generally, this is a safe procedure. However, as with any procedure, problems can occur and include:  °· Bleeding.   °· Bruising of the skin around where the incision was made.   °· Pain at the incision site.   °· Pulling apart of the skin at the incision site.   °· Infection.   °· Allergic reaction to anesthetics or other medicines used during the procedure.   °People with diabetes may have a temporary increase in their blood sugar after any surgical procedure.  °BEFORE THE PROCEDURE  °· Wash all  of the skin around the area of the chest where the pacemaker is located.   °· Ask your health care provider for help with any medicine adjustments before the pacemaker is replaced.   °· Do not eat or drink anything after midnight on the night before the procedure or as directed by your health care provider. °· Ask your health care provider if you can take a sip of water with any approved medicines the morning of the procedure. °PROCEDURE  °· After giving medicine to numb the skin (local anesthetic), your health care provider will make a cut to reopen the pocket holding the pacemaker.   °· The old pacemaker will be disconnected from its leads.   °· The leads will be tested.   °· If needed, the leads will be replaced. If the leads are functioning properly, the new pacemaker may be connected to the existing leads. °· A heart monitor and the pacemaker programmer will be used to make sure that the new pacemaker is working properly. °· The incision site will then be closed. A dressing will be placed over the pacemaker site. The dressing will be removed 24-48 hours afterward. °AFTER THE PROCEDURE  °· You will be taken to a recovery area after the new pacemaker implant is completed. Your vital signs such as blood pressure, heart rate, breathing, and oxygen levels will be monitored. °· Your health care provider will tell you when you will need to next test your pacemaker or when to return to the office for follow-up   for removal of stitches. Document Released: 09/03/2006 Document Revised: 10/10/2013 Document Reviewed: 12/08/2012 [facility] Patient Information 2015 [place], [place]. This information is not intended to replace advice given to you by your health care provider. Make sure you discuss any questions you have with your health care provider. Incision Care An incision is when a surgeon cuts into your body tissues. After surgery, the incision needs to be cared for properly to prevent infection.  HOME CARE INSTRUCTIONS    Take all medicine as directed by your caregiver. Only take over-the-counter or prescription medicines for pain, discomfort, or fever as directed by your caregiver.  Do not remove your bandage (dressing) or get your incision wet until your surgeon gives you permission. In the event that your dressing becomes wet, dirty, or starts to smell, change the dressing and call your surgeon for instructions as soon as possible.  Take showers. Do not take tub baths, swim, or do anything that may soak the wound until it is healed.  Resume your normal diet and activities as directed or allowed.  Avoid lifting any weight until you are instructed otherwise.  Use anti-itch antihistamine medicine as directed by your caregiver. The wound may itch when it is healing. Do not pick or scratch at the wound.  Follow up with your caregiver for stitch (suture) or staple removal as directed.  Drink enough fluids to keep your urine clear or pale yellow. SEEK MEDICAL CARE IF:   You have redness, swelling, or increasing pain in the wound that is not controlled with medicine.  You have drainage, blood, or pus coming from the wound that lasts longer than 1 day.  You develop muscle aches, chills, or a general ill feeling.  You notice a bad smell coming from the wound or dressing.  Your wound edges separate after the sutures, staples, or skin adhesive strips have been removed.  You develop persistent nausea or vomiting. SEEK IMMEDIATE MEDICAL CARE IF:   You have a fever.  You develop a rash.  You develop dizzy episodes or faint while standing.  You have difficulty breathing.  You develop any reaction or side effects to medicine given. MAKE SURE YOU:   Understand these instructions.  Will watch your condition.  Will get help right away if you are not doing well or get worse. Document Released: 12/13/2004 Document Revised: 08/18/2011 Document Reviewed: 07/20/2013 [facility] Patient Information 2015  [place], [place]. This information is not intended to replace advice given to you by your health care provider. Make sure you discuss any questions you have with your health care provider.

## 2014-11-21 NOTE — Interval H&P Note (Signed)
History and Physical Interval Note:  11/21/2014 11:30 AM  Richard Alvarado  has presented today for surgery, with the diagnosis of ppm eol  The various methods of treatment have been discussed with the patient and family. After consideration of risks, benefits and other options for treatment, the patient has consented to  Procedure(s): PPM/BIV PPM Generator Changeout (N/A) as a surgical intervention .  The patient's history has been reviewed, patient examined, no change in status, stable for surgery.  I have reviewed the patient's chart and labs.  Questions were answered to the patient's satisfaction.     Lyndzie Zentz

## 2014-11-21 NOTE — H&P (View-Only) (Signed)
Patient ID: Richard Alvarado, male   DOB: 1940/09/15, 74 y.o.   MRN: [id number]     Cardiology Office Note   Date:  11/14/2014   ID:  Richard Alvarado, DOB [birth year], MRN [id number]  PCP:  Richard Bogus, MD  Cardiologist:   [name], MD   Chief Complaint  Patient presents with  . ROV 6 months    patient reports no complaints      History of Present Illness: Richard Alvarado is a 74 y.o. male who presents for follow-up on severe systemic hypertension and pacemaker check. His dual-chamber St. Jude victory pacemaker was implanted in 2006 for second-degree AV block and syncope. He has rather unexpectedly reached elective replacement indicator. Battery voltage is still fair, but battery impedance suddenly jumped to 9.7 kiloohm. This actually happened in March. He has not noticed any changes in stamina, breathing. His blood pressure control has been good. Glycemic control has been adequate per his report (his most recent hemoglobin A1c from November 2015 was 7.0%). His last lipid profile last November was excellent.  Device interrogation shows good lead parameters. The atrial lead sensed P waves are 1.8 mV, impedance 424 ohm, threshold 0.5 V at 0.4 ms. Ventricular lead sensed R waves are greater than 12 mV, impedance 434 ohm, threshold 1 V at 0.4 ms. He has 16% atrial pacing and 3% ventricular pacing. No atrial tachyarrhythmia recorded.  He has no complaints.  He has a long-standing history of very severe and hard to control systemic hypertension. At one point he was taking minoxidil but this was stopped for a pericardial effusion. 20 years ago he had a left brain stroke from which she has recovered without sequelae.  Pertinent negatives include the absence of coronary insufficiency by nuclear stress testing and the absence of renal artery stenosis by ultrasonography.   Past Medical History  Diagnosis Date  . Hypertension     RENAL DOPPLER, 03/08/2009 - normal  . CVA (cerebral  vascular accident) 1994    Left brain  . Obstructive sleep apnea 09/26/2005    AHI-19.46, during REM-36.88  . Cerebral atherosclerosis     CAROTID DOPPLER, 09/07/2009 - RIGHT AND LEFT CCAs-small-moderate amount of irregular mixed density plaque, no significant evidence of diameter reduction; RIGHT AND LEFT ICAs-moderate amount irregular mixed density plaque, no evidence of significant diameter reduction  . Pericardial effusion     2D ECHO, 03/07/2011 - EF >70%, normal  . History of CVA (cerebrovascular accident) 04/24/2013  . Second degree heart block 04/24/2013  . Pacemaker St. Jude victory, dual chamber, 2006 04/24/2013    Past Surgical History  Procedure Laterality Date  . Pacemaker insertion  05/09/2005    St. Jude [place], [place] #[id number], serial [id number]  . Nm myoview ltd  03/31/2012    Normal study, no ECG changes, EKG negative for ischemia, post-stress EF 54%     Current Outpatient Prescriptions  Medication Sig Dispense Refill  . amLODipine (NORVASC) 5 MG tablet Take 7.5 mg by mouth daily.    Marland Kitchen aspirin 81 MG tablet Take 81 mg by mouth daily.    Marland Kitchen atorvastatin (LIPITOR) 80 MG tablet Take 80 mg by mouth at bedtime.    . cloNIDine (CATAPRES) 0.2 MG tablet Take 0.2 mg by mouth 2 (two) times daily.    . furosemide (LASIX) 40 MG tablet Take 40 mg by mouth daily.    Marland Kitchen glipiZIDE-metformin (METAGLIP) 5-500 MG per tablet Take 2 tablets by mouth 2 (two) times daily.  11  .  losartan (COZAAR) 100 MG tablet Take 200 mg by mouth daily.     . nebivolol (BYSTOLIC) 10 MG tablet Take 10 mg by mouth daily.    . niacin (NIASPAN) 500 MG CR tablet Take 500 mg by mouth at bedtime.    . potassium chloride (K-DUR) 10 MEQ tablet Take 1 tablet (10 mEq total) by mouth 3 (three) times daily. 270 tablet 2   No current facility-administered medications for this visit.    Allergies:   Review of patient's allergies indicates no known allergies.    Social History:  The patient  reports that he quit smoking  about 15 years ago. He does not have any smokeless tobacco history on file. He reports that he does not drink alcohol or use illicit drugs.   Family History:  The patient's family history includes CVA in his mother; Heart disease in his maternal grandmother; Hypertension in his sister.    ROS:  Please see the history of present illness.    Otherwise, review of systems positive for none.   All other systems are reviewed and negative.    PHYSICAL EXAM: VS:  BP 138/88 mmHg  Pulse 64  Ht 5\' 9"  (7.510 m)  Wt 95.845 kg (211 lb 4.8 oz)  BMI 31.19 kg/m2 , BMI Body mass index is 31.19 kg/(m^2).  General: Alert, oriented x3, no distress Head: no evidence of trauma, PERRL, EOMI, no exophtalmos or lid lag, no myxedema, no xanthelasma; normal ears, nose and oropharynx Neck: normal jugular venous pulsations and no hepatojugular reflux; brisk carotid pulses without delay and no carotid bruits Chest: clear to auscultation, no signs of consolidation by percussion or palpation, normal fremitus, symmetrical and full respiratory excursions Cardiovascular: normal position and quality of the apical impulse, regular rhythm, normal first and second heart sounds, no murmurs, rubs or gallops Abdomen: no tenderness or distention, no masses by palpation, no abnormal pulsatility or arterial bruits, normal bowel sounds, no hepatosplenomegaly Extremities: no clubbing, cyanosis or edema; 2+ radial, ulnar and brachial pulses bilaterally; 2+ right femoral, posterior tibial and dorsalis pedis pulses; 2+ left femoral, posterior tibial and dorsalis pedis pulses; no subclavian or femoral bruits Neurological: grossly nonfocal Psych: euthymic mood, full affect   EKG:  EKG is ordered today. The ekg ordered today demonstrates sinus rhythm, right bundle branch block, left anterior fascicular block   Recent Labs: 04/27/2014: ALT 12; ALT 12; BUN 13; BUN 13; Creatinine 1.02; Creatinine 1.02; Potassium 4.0; Potassium 4.0; Sodium  138; Sodium 138    Lipid Panel    Component Value Date/Time   CHOL 116 04/27/2014 0944   CHOL 109 04/27/2014 0944   TRIG 65 04/27/2014 0944   TRIG 70 04/27/2014 0944   HDL 36* 04/27/2014 0944   HDL 36* 04/27/2014 0944   CHOLHDL 3.2 04/27/2014 0944   CHOLHDL 3.0 04/27/2014 0944   VLDL 13 04/27/2014 0944   VLDL 14 04/27/2014 0944   LDLCALC 67 04/27/2014 0944   LDLCALC 59 04/27/2014 0944      Wt Readings from Last 3 Encounters:  11/14/14 95.845 kg (211 lb 4.8 oz)  04/25/14 93.895 kg (207 lb)  11/01/13 92.579 kg (204 lb 1.6 oz)     ASSESSMENT AND PLAN:  Pacemaker St. Jude victory, dual chamber, 2006 reached elective replacement indicator Normal device function otherwise. Discussed generator change out procedure.This procedure has been fully reviewed with the patient and written informed consent has been obtained.  Severe hypertension Blood pressure is fair today, usually normal.   Hyperlipidemia Also  has diabetes mellitus. At target LDL cholesterol less than 100. Due for repeat labs.  History of CVA (cerebrovascular accident)   Current medicines are reviewed at length with the patient today.  The patient does not have concerns regarding medicines.  The following changes have been made:  no change  Labs/ tests ordered today include:  Orders Placed This Encounter  Procedures  . APTT  . Protime-INR  . CBC  . Comprehensive metabolic panel  . Implantable device check  . EKG 12-Lead  . PACEMAKER GENERATOR CHANGE    Patient Instructions    Medication Instructions:  No changes  Labwork:  Today at [facility] for pre-procedure work-up.  Testing/Procedures:  Your physician has recommended that you have a new pacemaker battery inserted. A pacemaker is a small device that is placed under the skin of your chest or abdomen to help control abnormal heart rhythms. This device uses electrical pulses to prompt the heart to beat at a normal rate. Pacemakers are used to  treat heart rhythms that are too slow. Wire (leads) are attached to the pacemaker that goes into the chambers of you heart. This is done in the hospital and usually requires and overnight stay. Please see the instruction sheet given to you today for more information.    Follow-Up:  Instructions will be given after the above procedure.  Any Other Special Instructions Will Be Listed Below (If Applicable).      Mikael Spray, MD  11/14/2014 4:44 PM    [name], MD, [facility] [phone] office [phone] pager

## 2014-11-21 NOTE — Telephone Encounter (Signed)
Closed encounter °

## 2014-11-21 NOTE — Op Note (Signed)
Procedure report  Procedure performed:  1. Dual chamber pacemaker generator changeout  2. Light sedation  Reason for procedure:  1. Device generator at elective replacement interval  2. Second degree AV block  Procedure performed by:  [name], MD  Complications:  None  Estimated blood loss:  <5 mL  Medications administered during procedure:  Ancef 2 g intravenously,lidocaine 1% 30 mL locally, fentanyl 50 mcg intravenously, Versed 2 mg intravenously Device details:   Web designer. Jude Assurity model number 8315, serial number [id number] Right atrial lead (chronic) St. Jude [id number], serial [id number] (implanted 05/09/2005) Right ventricular lead (chronic)  St. Jude [id number], serial number [id number] (implanted 05/10/2015)  Explanted generator St. Jude [id number], serial number  [id number] (implanted 05/10/2015)  Procedure details:  After the risks and benefits of the procedure were discussed the patient provided informed consent. She was brought to the cardiac catheter lab in the fasting state. The patient was prepped and draped in usual sterile fashion. Local anesthesia with 1% lidocaine was administered to to the left infraclavicular area. A 5-6cm horizontal incision was made parallel with and 2-3 cm caudal to the right clavicle, in the area of an old scar. An older scar was seen closer to the clavicle. Using minimal electrocautery and mostly sharp and blunt dissection the prepectoral pocket was opened carefully to avoid injury to the loops of chronic leads. Extensive dissection was necessary. The device was explanted. The pocket was carefully inspected for hemostasis and flushed with copious amounts of antibiotic solution.  The leads were disconnected from the old generator and testing of the lead parameters via telemetry showed excellent values. The new generator was connected to the chronic leads, with appropriate pacing noted.   The entire system was then carefully inserted in the  pocket with care been taking that the leads and device assumed a comfortable position without pressure on the incision. Great care was taken that the leads be located deep to the generator. The pocket was then closed in layers using 2 layers of 2-0 Vicryl and cutaneous staples after which a sterile dressing was applied.   At the end of the procedure the following lead parameters were encountered:   Right atrial lead sensed P waves 1.4 mV, impedance 510 ohms, threshold 0.5 at 0.5 ms pulse width.  Right ventricular lead sensed R waves  >12 mV, impedance 610 ohms, threshold 1.0 at 0.5 ms pulse width.  [name], MD, [facility] [phone] office [phone] pager

## 2014-11-22 ENCOUNTER — Encounter (HOSPITAL_COMMUNITY): Payer: Self-pay | Admitting: Cardiovascular Disease

## 2014-11-29 ENCOUNTER — Ambulatory Visit (INDEPENDENT_AMBULATORY_CARE_PROVIDER_SITE_OTHER): Payer: Medicare Other | Admitting: *Deleted

## 2014-11-29 DIAGNOSIS — I441 Atrioventricular block, second degree: Secondary | ICD-10-CM

## 2014-11-29 LAB — CUP PACEART INCLINIC DEVICE CHECK
Battery Remaining Longevity: 139.2 mo
Battery Voltage: 3.04 V
Brady Statistic RA Percent Paced: 18 %
Brady Statistic RV Percent Paced: 14 %
Date Time Interrogation Session: 20160622105209
Lead Channel Impedance Value: 525 Ohm
Lead Channel Impedance Value: 587.5 Ohm
Lead Channel Pacing Threshold Amplitude: 0.5 V
Lead Channel Pacing Threshold Amplitude: 0.5 V
Lead Channel Pacing Threshold Amplitude: 1 V
Lead Channel Pacing Threshold Amplitude: 1 V
Lead Channel Pacing Threshold Pulse Width: 0.5 ms
Lead Channel Pacing Threshold Pulse Width: 0.5 ms
Lead Channel Pacing Threshold Pulse Width: 0.5 ms
Lead Channel Pacing Threshold Pulse Width: 0.5 ms
Lead Channel Sensing Intrinsic Amplitude: 12 mV
Lead Channel Sensing Intrinsic Amplitude: 2.3 mV
Lead Channel Setting Pacing Amplitude: 2 V
Lead Channel Setting Pacing Amplitude: 2 V
Lead Channel Setting Pacing Pulse Width: 0.5 ms
Lead Channel Setting Sensing Sensitivity: 2 mV
Pulse Gen Model: 2240
Pulse Gen Serial Number: 7768207

## 2014-11-29 NOTE — Progress Notes (Signed)
Changeout Wound check appointment. Staples removed. Wound without redness or edema. Incision edges approximated, wound well healed. Normal device function. Thresholds, sensing, and impedances consistent with implant measurements. Device programmed at 3.5V/auto capture programmed on for extra safety margin until 3 month visit. Histogram distribution appropriate for patient and level of activity. No mode switches or high ventricular rates noted. Patient educated about wound care, arm mobility, lifting restrictions. ROV in 3 months with implanting physician.

## 2014-12-01 ENCOUNTER — Encounter: Payer: Self-pay | Admitting: Cardiovascular Disease

## 2014-12-20 ENCOUNTER — Encounter: Payer: Self-pay | Admitting: Cardiovascular Disease

## 2015-01-22 DIAGNOSIS — I2510 Atherosclerotic heart disease of native coronary artery without angina pectoris: Secondary | ICD-10-CM | POA: Diagnosis not present

## 2015-01-22 DIAGNOSIS — G4730 Sleep apnea, unspecified: Secondary | ICD-10-CM | POA: Diagnosis not present

## 2015-01-22 DIAGNOSIS — I10 Essential (primary) hypertension: Secondary | ICD-10-CM | POA: Diagnosis not present

## 2015-01-22 DIAGNOSIS — E119 Type 2 diabetes mellitus without complications: Secondary | ICD-10-CM | POA: Diagnosis not present

## 2015-02-21 ENCOUNTER — Encounter: Payer: Self-pay | Admitting: Cardiovascular Disease

## 2015-02-21 ENCOUNTER — Ambulatory Visit (INDEPENDENT_AMBULATORY_CARE_PROVIDER_SITE_OTHER): Payer: Medicare Other | Admitting: Cardiovascular Disease

## 2015-02-21 VITALS — BP 140/82 | HR 62 | Resp 16 | Ht 69.0 in | Wt 205.5 lb

## 2015-02-21 DIAGNOSIS — I10 Essential (primary) hypertension: Secondary | ICD-10-CM | POA: Diagnosis not present

## 2015-02-21 DIAGNOSIS — I441 Atrioventricular block, second degree: Secondary | ICD-10-CM | POA: Diagnosis not present

## 2015-02-21 DIAGNOSIS — Z8673 Personal history of transient ischemic attack (TIA), and cerebral infarction without residual deficits: Secondary | ICD-10-CM | POA: Diagnosis not present

## 2015-02-21 DIAGNOSIS — Z950 Presence of cardiac pacemaker: Secondary | ICD-10-CM

## 2015-02-21 NOTE — Patient Instructions (Signed)
Your physician recommends that you schedule a follow-up appointment in: 3 months for device check.and 1 year with Dr. Sallyanne Kuster.

## 2015-02-21 NOTE — Progress Notes (Signed)
Patient ID: Richard Alvarado, male   DOB: 1940/08/07, 74 y.o.   MRN: [id number]      Cardiology Office Note   Date:  02/23/2015   ID:  Richard Alvarado, DOB 01-Dec-1940, MRN [id number]  PCP:  Richard Bogus, MD  Cardiologist:   [name], MD   Chief complaint: follow-up severe hypertension pacemaker check.     History of Present Illness: Richard Alvarado is a 74 y.o. male who presents for  Severe systemic hypertension and pacemaker check. He feels great without any cardiovascular complaints.   Interrogation of his pacemaker (a new generator implanted 2016) shows device anticipated longevity of 9-11 years , 37% atrial pacing, 21% ventricular pacing, excellent lead parameters and a single episode of mode switch consistent with paroxysmal atrial tachycardia that lasted for 12 seconds and was asymptomatic. Blood pressure control is good.  He has a long-standing history of very severe and hard to control systemic hypertension. At one point he was taking minoxidil but this was stopped for a pericardial effusion. 20 years ago he had a left brain stroke from which she has recovered without sequelae.  Pertinent negatives include the absence of coronary insufficiency by nuclear stress testing and the absence of renal artery stenosis by ultrasonography.   Past Medical History  Diagnosis Date  . Hypertension     RENAL DOPPLER, 03/08/2009 - normal  . CVA (cerebral vascular accident) 1994    Left brain  . Obstructive sleep apnea 09/26/2005    AHI-19.46, during REM-36.88  . Cerebral atherosclerosis     CAROTID DOPPLER, 09/07/2009 - RIGHT AND LEFT CCAs-small-moderate amount of irregular mixed density plaque, no significant evidence of diameter reduction; RIGHT AND LEFT ICAs-moderate amount irregular mixed density plaque, no evidence of significant diameter reduction  . Pericardial effusion     2D ECHO, 03/07/2011 - EF >70%, normal  . Hypertension in pregnancy, preeclampsia, severe  04/24/2013  . History of CVA (cerebrovascular accident) 04/24/2013  . Second degree heart block 04/24/2013  . Pacemaker St. Jude victory, dual chamber, 2006 04/24/2013    Past Surgical History  Procedure Laterality Date  . Pacemaker insertion  05/09/2005    St. Jude [place], [place] #[id number], serial [id number]  . Nm myoview ltd  03/31/2012    Normal study, no ECG changes, EKG negative for ischemia, post-stress EF 54%  . Ep implantable device N/A 11/21/2014    Procedure: PPM/BIV PPM Generator Changeout;  Surgeon: [name], MD;  Location: [place] [facility];  Service: Cardiovascular;  Laterality: N/A;     Current Outpatient Prescriptions  Medication Sig Dispense Refill  . amLODipine (NORVASC) 5 MG tablet Take 7.5 mg by mouth daily.    Marland Kitchen aspirin EC 81 MG tablet Take 81 mg by mouth at bedtime.    Marland Kitchen atorvastatin (LIPITOR) 80 MG tablet Take 80 mg by mouth at bedtime.    . cloNIDine (CATAPRES) 0.2 MG tablet Take 0.2 mg by mouth 2 (two) times daily.    . furosemide (LASIX) 40 MG tablet Take 40 mg by mouth daily.    Marland Kitchen glipiZIDE-metformin (METAGLIP) 5-500 MG per tablet Take 2 tablets by mouth 2 (two) times daily.  11  . KLOR-CON 10 10 MEQ tablet TAKE 1 TABLET (10 MEQ TOTAL) BY MOUTH 3 (THREE) TIMES DAILY. 270 tablet 3  . losartan (COZAAR) 100 MG tablet Take 200 mg by mouth daily.     . nebivolol (BYSTOLIC) 10 MG tablet Take 10 mg by mouth daily.    . niacin (NIASPAN) 500  MG CR tablet Take 500 mg by mouth at bedtime.     No current facility-administered medications for this visit.    Allergies:   Review of patient's allergies indicates no known allergies.    Social History:  The patient  reports that he quit smoking about 15 years ago. He does not have any smokeless tobacco history on file. He reports that he does not drink alcohol or use illicit drugs.   Family History:  The patient's family history includes CVA in his mother; Heart disease in his maternal grandmother; Hypertension in his  sister.    ROS:  Please see the history of present illness.    Otherwise, review of systems positive for none.   All other systems are reviewed and negative.    PHYSICAL EXAM: VS:  BP 140/82 mmHg  Pulse 62  Resp 16  Ht 5\' 9"  (1.753 m)  Wt 205 lb 8 oz (93.214 kg)  BMI 30.33 kg/m2 , BMI Body mass index is 30.33 kg/(m^2).  General: Alert, oriented x3, no distress Head: no evidence of trauma, PERRL, EOMI, no exophtalmos or lid lag, no myxedema, no xanthelasma; normal ears, nose and oropharynx Neck: normal jugular venous pulsations and no hepatojugular reflux; brisk carotid pulses without delay and no carotid bruits Chest: clear to auscultation, no signs of consolidation by percussion or palpation, normal fremitus, symmetrical and full respiratory excursions Cardiovascular: normal position and quality of the apical impulse, regular rhythm, normal first and second heart sounds, no murmurs, rubs; +ve S4 gallop Abdomen: no tenderness or distention, no masses by palpation, no abnormal pulsatility or arterial bruits, normal bowel sounds, no hepatosplenomegaly Extremities: no clubbing, cyanosis or edema; 2+ radial, ulnar and brachial pulses bilaterally; 2+ right femoral, posterior tibial and dorsalis pedis pulses; 2+ left femoral, posterior tibial and dorsalis pedis pulses; no subclavian or femoral bruits Neurological: grossly nonfocal Psych: euthymic mood, full affect   EKG:  EKG is not ordered today. The ekg ordered today demonstrates    Recent Labs: 11/14/2014: ALT 16; BUN 18; Creat 1.09; Hemoglobin 12.7*; Platelets 238; Potassium 3.6; Sodium 138    Lipid Panel    Component Value Date/Time   CHOL 116 04/27/2014 0944   CHOL 109 04/27/2014 0944   TRIG 65 04/27/2014 0944   TRIG 70 04/27/2014 0944   HDL 36* 04/27/2014 0944   HDL 36* 04/27/2014 0944   CHOLHDL 3.2 04/27/2014 0944   CHOLHDL 3.0 04/27/2014 0944   VLDL 13 04/27/2014 0944   VLDL 14 04/27/2014 0944   LDLCALC 67 04/27/2014  0944   LDLCALC 59 04/27/2014 0944      Wt Readings from Last 3 Encounters:  02/21/15 205 lb 8 oz (93.214 kg)  11/21/14 209 lb (94.802 kg)  11/14/14 211 lb 4.8 oz (95.845 kg)      ASSESSMENT AND PLAN:  Pacemaker St. Jude Assurity (leads 2006, new generator 2015)  Severe hypertension Blood pressure is fair today.   Hyperlipidemia Also has diabetes mellitus. LDL cholesterol  At target  History of CVA (cerebrovascular accident)  never has had documentation of atrial fibrillation by pacemaker check   Current medicines are reviewed at length with the patient today.  The patient does not have concerns regarding medicines.  The following changes have been made:  no change  Labs/ tests ordered today include:  No orders of the defined types were placed in this encounter.     Patient Instructions  Your physician recommends that you schedule a follow-up appointment in: 3 months for  device check.and 1 year with Dr. Sallyanne Kuster.     Mikael Spray, MD  02/23/2015 10:53 AM    [name], MD, [facility] [phone] office [phone] pager

## 2015-03-03 LAB — CUP PACEART INCLINIC DEVICE CHECK
Battery Remaining Percentage: 95 %
Battery Voltage: 3.04 V
Brady Statistic RA Percent Paced: 37 %
Brady Statistic RV Percent Paced: 21 %
Date Time Interrogation Session: 20160924122332
Lead Channel Impedance Value: 480 Ohm
Lead Channel Impedance Value: 590 Ohm
Lead Channel Pacing Threshold Amplitude: 0.375 V
Lead Channel Pacing Threshold Amplitude: 0.75 V
Lead Channel Pacing Threshold Pulse Width: 0.4 ms
Lead Channel Pacing Threshold Pulse Width: 0.4 ms
Lead Channel Sensing Intrinsic Amplitude: 1 mV
Lead Channel Sensing Intrinsic Amplitude: 12 mV
Pulse Gen Model: 2240
Pulse Gen Serial Number: 7768207

## 2015-03-27 ENCOUNTER — Ambulatory Visit (INDEPENDENT_AMBULATORY_CARE_PROVIDER_SITE_OTHER): Payer: Medicare Other | Admitting: Urology

## 2015-03-27 DIAGNOSIS — C61 Malignant neoplasm of prostate: Secondary | ICD-10-CM | POA: Diagnosis not present

## 2015-03-27 DIAGNOSIS — N429 Disorder of prostate, unspecified: Secondary | ICD-10-CM | POA: Diagnosis not present

## 2015-03-27 DIAGNOSIS — Z8546 Personal history of malignant neoplasm of prostate: Secondary | ICD-10-CM | POA: Diagnosis not present

## 2015-05-24 ENCOUNTER — Ambulatory Visit (INDEPENDENT_AMBULATORY_CARE_PROVIDER_SITE_OTHER): Payer: Medicare Other | Admitting: *Deleted

## 2015-05-24 DIAGNOSIS — I441 Atrioventricular block, second degree: Secondary | ICD-10-CM

## 2015-05-24 DIAGNOSIS — I10 Essential (primary) hypertension: Secondary | ICD-10-CM | POA: Diagnosis not present

## 2015-05-24 DIAGNOSIS — E785 Hyperlipidemia, unspecified: Secondary | ICD-10-CM | POA: Diagnosis not present

## 2015-05-24 DIAGNOSIS — E119 Type 2 diabetes mellitus without complications: Secondary | ICD-10-CM | POA: Diagnosis not present

## 2015-05-24 DIAGNOSIS — I2510 Atherosclerotic heart disease of native coronary artery without angina pectoris: Secondary | ICD-10-CM | POA: Diagnosis not present

## 2015-05-24 NOTE — Progress Notes (Signed)
Remote pacemaker transmission.   

## 2015-05-28 LAB — CUP PACEART REMOTE DEVICE CHECK
Battery Remaining Longevity: 119 mo
Battery Remaining Percentage: 95.5 %
Battery Voltage: 3.02 V
Brady Statistic AP VP Percent: 4.8 %
Brady Statistic AP VS Percent: 14 %
Brady Statistic AS VP Percent: 1.6 %
Brady Statistic AS VS Percent: 79 %
Brady Statistic RA Percent Paced: 19 %
Brady Statistic RV Percent Paced: 6.4 %
Date Time Interrogation Session: 20161215070012
Implantable Lead Implant Date: 20061201
Implantable Lead Implant Date: 20061201
Implantable Lead Location: 753859
Implantable Lead Location: 753860
Lead Channel Impedance Value: 460 Ohm
Lead Channel Impedance Value: 590 Ohm
Lead Channel Pacing Threshold Amplitude: 0.375 V
Lead Channel Pacing Threshold Pulse Width: 0.5 ms
Lead Channel Sensing Intrinsic Amplitude: 1.2 mV
Lead Channel Sensing Intrinsic Amplitude: 12 mV
Lead Channel Setting Pacing Amplitude: 1.375
Lead Channel Setting Pacing Amplitude: 2 V
Lead Channel Setting Pacing Pulse Width: 0.5 ms
Lead Channel Setting Sensing Sensitivity: 2 mV
Pulse Gen Model: 2240
Pulse Gen Serial Number: 7768207

## 2015-05-31 ENCOUNTER — Encounter: Payer: Self-pay | Admitting: Cardiology

## 2015-08-23 ENCOUNTER — Ambulatory Visit (INDEPENDENT_AMBULATORY_CARE_PROVIDER_SITE_OTHER): Payer: Medicare Other | Admitting: *Deleted

## 2015-08-23 DIAGNOSIS — I441 Atrioventricular block, second degree: Secondary | ICD-10-CM

## 2015-08-23 NOTE — Progress Notes (Signed)
Remote pacemaker transmission.   

## 2015-09-04 DIAGNOSIS — Z8546 Personal history of malignant neoplasm of prostate: Secondary | ICD-10-CM | POA: Diagnosis not present

## 2015-09-25 LAB — CUP PACEART REMOTE DEVICE CHECK
Battery Remaining Longevity: 115 mo
Battery Remaining Percentage: 95.5 %
Battery Voltage: 3.02 V
Brady Statistic AP VP Percent: 12 %
Brady Statistic AP VS Percent: 15 %
Brady Statistic AS VP Percent: 2.3 %
Brady Statistic AS VS Percent: 71 %
Brady Statistic RA Percent Paced: 27 %
Brady Statistic RV Percent Paced: 14 %
Date Time Interrogation Session: 20170316060014
Implantable Lead Implant Date: 20061201
Implantable Lead Implant Date: 20061201
Implantable Lead Location: 753859
Implantable Lead Location: 753860
Lead Channel Impedance Value: 460 Ohm
Lead Channel Impedance Value: 590 Ohm
Lead Channel Pacing Threshold Amplitude: 2 V
Lead Channel Pacing Threshold Pulse Width: 0.5 ms
Lead Channel Sensing Intrinsic Amplitude: 1.1 mV
Lead Channel Sensing Intrinsic Amplitude: 12 mV
Lead Channel Setting Pacing Amplitude: 2 V
Lead Channel Setting Pacing Amplitude: 3.5 V
Lead Channel Setting Pacing Pulse Width: 0.5 ms
Lead Channel Setting Sensing Sensitivity: 2 mV
Pulse Gen Model: 2240
Pulse Gen Serial Number: 7768207

## 2015-09-26 ENCOUNTER — Encounter: Payer: Self-pay | Admitting: Cardiology

## 2015-10-09 ENCOUNTER — Ambulatory Visit (INDEPENDENT_AMBULATORY_CARE_PROVIDER_SITE_OTHER): Payer: Medicare Other | Admitting: Urology

## 2015-10-09 DIAGNOSIS — Z8546 Personal history of malignant neoplasm of prostate: Secondary | ICD-10-CM | POA: Diagnosis not present

## 2015-10-09 DIAGNOSIS — N5231 Erectile dysfunction following radical prostatectomy: Secondary | ICD-10-CM

## 2015-10-09 DIAGNOSIS — N429 Disorder of prostate, unspecified: Secondary | ICD-10-CM | POA: Diagnosis not present

## 2015-10-18 DIAGNOSIS — I10 Essential (primary) hypertension: Secondary | ICD-10-CM | POA: Diagnosis not present

## 2015-10-18 DIAGNOSIS — C61 Malignant neoplasm of prostate: Secondary | ICD-10-CM | POA: Diagnosis not present

## 2015-10-18 DIAGNOSIS — I2510 Atherosclerotic heart disease of native coronary artery without angina pectoris: Secondary | ICD-10-CM | POA: Diagnosis not present

## 2015-10-18 DIAGNOSIS — E1165 Type 2 diabetes mellitus with hyperglycemia: Secondary | ICD-10-CM | POA: Diagnosis not present

## 2015-11-05 ENCOUNTER — Other Ambulatory Visit: Payer: Self-pay | Admitting: Cardiovascular Disease

## 2015-11-06 NOTE — Telephone Encounter (Signed)
Rx has been sent to the pharmacy electronically. ° °

## 2015-11-22 ENCOUNTER — Ambulatory Visit (INDEPENDENT_AMBULATORY_CARE_PROVIDER_SITE_OTHER): Payer: Medicare Other | Admitting: *Deleted

## 2015-11-22 DIAGNOSIS — I441 Atrioventricular block, second degree: Secondary | ICD-10-CM

## 2015-11-22 NOTE — Progress Notes (Signed)
Remote pacemaker transmission.   

## 2015-11-27 LAB — CUP PACEART REMOTE DEVICE CHECK
Battery Remaining Longevity: 91 mo
Battery Remaining Percentage: 95.5 %
Battery Voltage: 3.01 V
Brady Statistic AP VP Percent: 10 %
Brady Statistic AP VS Percent: 13 %
Brady Statistic AS VP Percent: 2.6 %
Brady Statistic AS VS Percent: 75 %
Brady Statistic RA Percent Paced: 23 %
Brady Statistic RV Percent Paced: 13 %
Date Time Interrogation Session: 20170615065212
Implantable Lead Implant Date: 20061201
Implantable Lead Implant Date: 20061201
Implantable Lead Location: 753859
Implantable Lead Location: 753860
Lead Channel Impedance Value: 460 Ohm
Lead Channel Impedance Value: 590 Ohm
Lead Channel Sensing Intrinsic Amplitude: 0.8 mV
Lead Channel Sensing Intrinsic Amplitude: 12 mV
Lead Channel Setting Pacing Amplitude: 2 V
Lead Channel Setting Pacing Amplitude: 5 V
Lead Channel Setting Pacing Pulse Width: 0.5 ms
Lead Channel Setting Sensing Sensitivity: 2 mV
Pulse Gen Model: 2240
Pulse Gen Serial Number: 7768207

## 2015-11-28 ENCOUNTER — Encounter: Payer: Self-pay | Admitting: Cardiology

## 2016-01-23 DIAGNOSIS — I10 Essential (primary) hypertension: Secondary | ICD-10-CM | POA: Diagnosis not present

## 2016-01-23 DIAGNOSIS — I2510 Atherosclerotic heart disease of native coronary artery without angina pectoris: Secondary | ICD-10-CM | POA: Diagnosis not present

## 2016-01-23 DIAGNOSIS — E119 Type 2 diabetes mellitus without complications: Secondary | ICD-10-CM | POA: Diagnosis not present

## 2016-01-23 DIAGNOSIS — C61 Malignant neoplasm of prostate: Secondary | ICD-10-CM | POA: Diagnosis not present

## 2016-01-30 DIAGNOSIS — I10 Essential (primary) hypertension: Secondary | ICD-10-CM | POA: Diagnosis not present

## 2016-01-30 DIAGNOSIS — C61 Malignant neoplasm of prostate: Secondary | ICD-10-CM | POA: Diagnosis not present

## 2016-01-30 DIAGNOSIS — E119 Type 2 diabetes mellitus without complications: Secondary | ICD-10-CM | POA: Diagnosis not present

## 2016-01-30 DIAGNOSIS — I2510 Atherosclerotic heart disease of native coronary artery without angina pectoris: Secondary | ICD-10-CM | POA: Diagnosis not present

## 2016-02-19 ENCOUNTER — Encounter: Payer: Medicare Other | Admitting: Cardiovascular Disease

## 2016-03-18 ENCOUNTER — Ambulatory Visit (INDEPENDENT_AMBULATORY_CARE_PROVIDER_SITE_OTHER): Payer: Medicare Other | Admitting: Cardiovascular Disease

## 2016-03-18 VITALS — BP 160/92 | HR 60 | Ht 69.0 in | Wt 194.2 lb

## 2016-03-18 DIAGNOSIS — Z950 Presence of cardiac pacemaker: Secondary | ICD-10-CM

## 2016-03-18 DIAGNOSIS — I441 Atrioventricular block, second degree: Secondary | ICD-10-CM | POA: Diagnosis not present

## 2016-03-18 DIAGNOSIS — E782 Mixed hyperlipidemia: Secondary | ICD-10-CM

## 2016-03-18 DIAGNOSIS — Z8673 Personal history of transient ischemic attack (TIA), and cerebral infarction without residual deficits: Secondary | ICD-10-CM

## 2016-03-18 DIAGNOSIS — I10 Essential (primary) hypertension: Secondary | ICD-10-CM

## 2016-03-18 DIAGNOSIS — E119 Type 2 diabetes mellitus without complications: Secondary | ICD-10-CM

## 2016-03-18 NOTE — Progress Notes (Signed)
Cardiology Office Note    Date:  03/19/2016   ID:  [name], DOB [birth year], MRN [id number]  PCP:  Alonza Bogus, MD  Cardiologist:   [name], MD   Chief Complaint  Patient presents with  . Follow-up    pt denied chest pain and SOB    History of Present Illness:  Richard Alvarado is a 75 y.o. male with second-degree atrioventricular block returning for pacemaker follow-up as well as hypertension, hyperlipidemia, DM and history of remote stroke. He had the pacemaker initially implanted in 2006 and underwent a pacemaker generator change out in June 2016. He has not had any significant health problems since that time.  The patient specifically denies any chest pain at rest exertion, dyspnea at rest or with exertion, orthopnea, paroxysmal nocturnal dyspnea, syncope, palpitations, focal neurological deficits, intermittent claudication, lower extremity edema, unexplained weight gain, cough, hemoptysis or wheezing.   Pacemaker interrogation shows normal device function with estimated generator longevity of 6-10 years. The broad range reported this is due to noise reversion with increase in atrial pacing to high output. It's not clear what caused the noise reversion. Electrograms were not turned "on". Other than that there is normal pacemaker function. He has 26% atrial pacing and 16% ventricular pacing. The presenting rhythm was atrial paced ventricular sensed today. 6 episodes of mode switch are recorded all them extremely brief. There is no atrial fibrillation.  He has a long-standing history of very severe and hard to control systemic hypertension. At one point he was taking minoxidil but this was stopped for a pericardial effusion. 20 years ago he had a left brain stroke from which she has recovered without sequelae.  Pertinent negatives include the absence of coronary insufficiency by nuclear stress testing and the absence of renal artery stenosis by ultrasonography.     Past Medical History:  Diagnosis Date  . Cerebral atherosclerosis    CAROTID DOPPLER, 09/07/2009 - RIGHT AND LEFT CCAs-small-moderate amount of irregular mixed density plaque, no significant evidence of diameter reduction; RIGHT AND LEFT ICAs-moderate amount irregular mixed density plaque, no evidence of significant diameter reduction  . CVA (cerebral vascular accident) 1994   Left brain  . History of CVA (cerebrovascular accident) 04/24/2013  . Hypertension    RENAL DOPPLER, 03/08/2009 - normal  . Hypertension in pregnancy, preeclampsia, severe 04/24/2013  . Obstructive sleep apnea 09/26/2005   AHI-19.46, during REM-36.88  . Pacemaker St. Jude victory, dual chamber, 2006 04/24/2013  . Pericardial effusion    2D ECHO, 03/07/2011 - EF >70%, normal  . Second degree heart block 04/24/2013    Past Surgical History:  Procedure Laterality Date  . EP IMPLANTABLE DEVICE N/A 11/21/2014   Procedure: PPM/BIV PPM Generator Changeout;  Surgeon: [name], MD;  Location: [facility];  Service: Cardiovascular;  Laterality: N/A;  . NM MYOVIEW LTD  03/31/2012   Normal study, no ECG changes, EKG negative for ischemia, post-stress EF 54%  . PACEMAKER INSERTION  05/09/2005   St. Jude [place], [place] #[id number], serial #[id number]    Current Medications: Outpatient Medications Prior to Visit  Medication Sig Dispense Refill  . amLODipine (NORVASC) 5 MG tablet Take 7.5 mg by mouth daily.    Marland Kitchen aspirin EC 81 MG tablet Take 81 mg by mouth at bedtime.    Marland Kitchen atorvastatin (LIPITOR) 80 MG tablet Take 80 mg by mouth at bedtime.    . cloNIDine (CATAPRES) 0.2 MG tablet Take 0.2 mg by mouth 2 (two) times daily.    Marland Kitchen  furosemide (LASIX) 40 MG tablet Take 40 mg by mouth daily.    Marland Kitchen glipiZIDE-metformin (METAGLIP) 5-500 MG per tablet Take 2 tablets by mouth 2 (two) times daily.  11  . KLOR-CON 10 10 MEQ tablet TAKE 1 TABLET BY MOUTH 3 TIMES A DAY 270 tablet 2  . losartan (COZAAR) 100 MG tablet Take 200 mg by mouth  daily.     . nebivolol (BYSTOLIC) 10 MG tablet Take 10 mg by mouth daily.    . niacin (NIASPAN) 500 MG CR tablet Take 500 mg by mouth at bedtime.     No facility-administered medications prior to visit.      Allergies:   Review of patient's allergies indicates no known allergies.   Social History   Social History  . Marital status: Married    Spouse name: N/A  . Number of children: N/A  . Years of education: N/A   Social History Main Topics  . Smoking status: Former Smoker    Quit date: 06/09/1999  . Smokeless tobacco: Not on file  . Alcohol use No  . Drug use: No  . Sexual activity: Not on file   Other Topics Concern  . Not on file   Social History Narrative  . No narrative on file     Family History:  The patient's family history includes CVA in his mother; Heart disease in his maternal grandmother; Hypertension in his sister.   ROS:   Please see the history of present illness.    ROS All other systems reviewed and are negative.   PHYSICAL EXAM:   VS:  BP (!) 160/92   Pulse 60   Ht 5\' 9"  (1.753 m)   Wt 194 lb 3.2 oz (88.1 kg)   BMI 28.68 kg/m    GEN: Well nourished, well developed, in no acute distress  HEENT: normal  Neck: no JVD, carotid bruits, or masses Cardiac: RRR; no murmurs, rubs, or gallops,no edema , healthy left subclavian pacemaker site Respiratory:  clear to auscultation bilaterally, normal work of breathing GI: soft, nontender, nondistended, + BS MS: no deformity or atrophy  Skin: warm and dry, no rash Neuro:  Alert and Oriented x 3, Strength and sensation are intact Psych: euthymic mood, full affect  Wt Readings from Last 3 Encounters:  03/18/16 194 lb 3.2 oz (88.1 kg)  02/21/15 205 lb 8 oz (93.2 kg)  11/21/14 209 lb (94.8 kg)      Studies/Labs Reviewed:   EKG:  EKG is ordered today.  The ekg ordered today demonstrates atrial paced ventricular sensed rhythm, left ventricular hypertrophy with repolarization changes. Right bundle branch  block is not present on the current ECG   Recent Labs: No results found for requested labs within last 8760 hours.   Lipid Panel    Component Value Date/Time   CHOL 116 04/27/2014 0944   CHOL 109 04/27/2014 0944   TRIG 65 04/27/2014 0944   TRIG 70 04/27/2014 0944   HDL 36 (L) 04/27/2014 0944   HDL 36 (L) 04/27/2014 0944   CHOLHDL 3.2 04/27/2014 0944   CHOLHDL 3.0 04/27/2014 0944   VLDL 13 04/27/2014 0944   VLDL 14 04/27/2014 0944   LDLCALC 67 04/27/2014 0944   LDLCALC 59 04/27/2014 0944     ASSESSMENT:    1. Second degree heart block   2. Pacemaker   3. Severe hypertension   4. Mixed hyperlipidemia   5. Diabetes mellitus type 2 in nonobese (HCC)   6. History of CVA (cerebrovascular  accident)      PLAN:  In order of problems listed above:  1. Second degree AV block: He is not pacemaker dependent and requires relatively infrequent ventricular pacing, only 16%. 2. PM: Explained noise reversion. Electrograms turned on. Otherwise normal device function. Reprogrammed outputs based on threshold. Remote download in 3 months and office visit yearly 3. HTN: Fair control. Asked him to continue monitoring at home. There is room to increase amlodipine if necessary. 4. HLP: Good lipid profile with the exception of relatively low HDL cholesterol, on statin. Not sure that treatment with Niaspan is helping. We'll try to obtain his most recent lipid profile. 5. DM: He reports good control 6. Remote CVA: No sequelae. Has not had atrial fibrillation detected by his device. Previous carotid duplex ultrasound did not show significant obstruction.    Medication Adjustments/Labs and Tests Ordered: Current medicines are reviewed at length with the patient today.  Concerns regarding medicines are outlined above.  Medication changes, Labs and Tests ordered today are listed in the Patient Instructions below. Patient Instructions  Dr Sallyanne Kuster recommends that you continue on your current  medications as directed. Please refer to the Current Medication list given to you today.  Remote monitoring is used to monitor your Pacemaker of ICD from home. This monitoring reduces the number of office visits required to check your device to one time per year. It allows [place] to keep an eye on the functioning of your device to ensure it is working properly. You are scheduled for a device check from home on Tuesday, January 9th, 2018. You may send your transmission at any time that day. If you have a wireless device, the transmission will be sent automatically. After your physician reviews your transmission, you will receive a postcard with your next transmission date.  Dr Sallyanne Kuster recommends that you schedule a follow-up appointment in 12 months with a pacemaker check. You will receive a reminder letter in the mail two months in advance. If you don't receive a letter, please call our office to schedule the follow-up appointment.  If you need a refill on your cardiac medications before your next appointment, please call your pharmacy.    Signed, [name], MD  03/19/2016 9:59 AM    [facility] [place], [place], [place]  [address] Phone: [phone]; Fax: [phone]

## 2016-03-18 NOTE — Patient Instructions (Signed)
Dr Sallyanne Kuster recommends that you continue on your current medications as directed. Please refer to the Current Medication list given to you today.  Remote monitoring is used to monitor your Pacemaker of ICD from home. This monitoring reduces the number of office visits required to check your device to one time per year. It allows [place] to keep an eye on the functioning of your device to ensure it is working properly. You are scheduled for a device check from home on Tuesday, January 9th, 2018. You may send your transmission at any time that day. If you have a wireless device, the transmission will be sent automatically. After your physician reviews your transmission, you will receive a postcard with your next transmission date.  Dr Sallyanne Kuster recommends that you schedule a follow-up appointment in 12 months with a pacemaker check. You will receive a reminder letter in the mail two months in advance. If you don't receive a letter, please call our office to schedule the follow-up appointment.  If you need a refill on your cardiac medications before your next appointment, please call your pharmacy.

## 2016-03-19 ENCOUNTER — Encounter: Payer: Self-pay | Admitting: Cardiovascular Disease

## 2016-03-19 DIAGNOSIS — E119 Type 2 diabetes mellitus without complications: Secondary | ICD-10-CM | POA: Insufficient documentation

## 2016-03-21 ENCOUNTER — Telehealth: Payer: Self-pay

## 2016-03-21 ENCOUNTER — Emergency Department
Admission: EM | Admit: 2016-03-21 | Discharge: 2016-03-22 | Payer: Medicare Other | Attending: Emergency Medicine | Admitting: Emergency Medicine

## 2016-03-21 ENCOUNTER — Emergency Department: Payer: Medicare Other

## 2016-03-21 ENCOUNTER — Encounter: Payer: Self-pay | Admitting: Urgent Care

## 2016-03-21 DIAGNOSIS — Z79899 Other long term (current) drug therapy: Secondary | ICD-10-CM | POA: Insufficient documentation

## 2016-03-21 DIAGNOSIS — I10 Essential (primary) hypertension: Secondary | ICD-10-CM

## 2016-03-21 DIAGNOSIS — E785 Hyperlipidemia, unspecified: Secondary | ICD-10-CM

## 2016-03-21 DIAGNOSIS — Z7982 Long term (current) use of aspirin: Secondary | ICD-10-CM | POA: Insufficient documentation

## 2016-03-21 DIAGNOSIS — I169 Hypertensive crisis, unspecified: Secondary | ICD-10-CM | POA: Diagnosis not present

## 2016-03-21 DIAGNOSIS — R262 Difficulty in walking, not elsewhere classified: Secondary | ICD-10-CM | POA: Diagnosis not present

## 2016-03-21 DIAGNOSIS — I619 Nontraumatic intracerebral hemorrhage, unspecified: Secondary | ICD-10-CM

## 2016-03-21 DIAGNOSIS — E119 Type 2 diabetes mellitus without complications: Secondary | ICD-10-CM | POA: Diagnosis not present

## 2016-03-21 DIAGNOSIS — Z87891 Personal history of nicotine dependence: Secondary | ICD-10-CM | POA: Insufficient documentation

## 2016-03-21 DIAGNOSIS — I618 Other nontraumatic intracerebral hemorrhage: Secondary | ICD-10-CM | POA: Diagnosis not present

## 2016-03-21 DIAGNOSIS — R531 Weakness: Secondary | ICD-10-CM | POA: Diagnosis present

## 2016-03-21 LAB — CBC WITH DIFFERENTIAL/PLATELET
Basophils Absolute: 0.1 10*3/uL (ref 0–0.1)
Basophils Relative: 1 %
Eosinophils Absolute: 0.2 10*3/uL (ref 0–0.7)
Eosinophils Relative: 4 %
HCT: 47 % (ref 40.0–52.0)
Hemoglobin: 15 g/dL (ref 13.0–18.0)
Lymphocytes Relative: 37 %
Lymphs Abs: 1.9 10*3/uL (ref 1.0–3.6)
MCH: 24.5 pg — ABNORMAL LOW (ref 26.0–34.0)
MCHC: 31.9 g/dL — ABNORMAL LOW (ref 32.0–36.0)
MCV: 76.7 fL — ABNORMAL LOW (ref 80.0–100.0)
Monocytes Absolute: 0.8 10*3/uL (ref 0.2–1.0)
Monocytes Relative: 15 %
Neutro Abs: 2.3 10*3/uL (ref 1.4–6.5)
Neutrophils Relative %: 43 %
Platelets: 260 10*3/uL (ref 150–440)
RBC: 6.12 MIL/uL — ABNORMAL HIGH (ref 4.40–5.90)
RDW: 14.4 % (ref 11.5–14.5)
WBC: 5.2 10*3/uL (ref 3.8–10.6)

## 2016-03-21 LAB — COMPREHENSIVE METABOLIC PANEL
ALT: 23 U/L (ref 17–63)
AST: 33 U/L (ref 15–41)
Albumin: 4.7 g/dL (ref 3.5–5.0)
Alkaline Phosphatase: 91 U/L (ref 38–126)
Anion gap: 10 (ref 5–15)
BUN: 12 mg/dL (ref 6–20)
CO2: 21 mmol/L — ABNORMAL LOW (ref 22–32)
Calcium: 9.6 mg/dL (ref 8.9–10.3)
Chloride: 105 mmol/L (ref 101–111)
Creatinine, Ser: 1.05 mg/dL (ref 0.61–1.24)
GFR calc Af Amer: >60 mL/min (ref >60)
GFR calc non Af Amer: >60 mL/min (ref >60)
Glucose, Bld: 169 mg/dL — ABNORMAL HIGH (ref 65–99)
Potassium: 3 mmol/L — ABNORMAL LOW (ref 3.5–5.1)
Sodium: 136 mmol/L (ref 135–145)
Total Bilirubin: 0.6 mg/dL (ref 0.3–1.2)
Total Protein: 9.4 g/dL — ABNORMAL HIGH (ref 6.5–8.1)

## 2016-03-21 LAB — URINE DRUG SCREEN, QUALITATIVE (ARMC ONLY)
Amphetamines, Ur Screen: NOT DETECTED
Barbiturates, Ur Screen: NOT DETECTED
Benzodiazepine, Ur Scrn: NOT DETECTED
Cannabinoid 50 Ng, Ur ~~LOC~~: NOT DETECTED
Cocaine Metabolite,Ur ~~LOC~~: NOT DETECTED
MDMA (Ecstasy)Ur Screen: NOT DETECTED
Methadone Scn, Ur: NOT DETECTED
Opiate, Ur Screen: NOT DETECTED
Phencyclidine (PCP) Ur S: NOT DETECTED
Tricyclic, Ur Screen: NOT DETECTED

## 2016-03-21 LAB — PROTIME-INR
INR: 1.06
Prothrombin Time: 13.8 seconds (ref 11.4–15.2)

## 2016-03-21 LAB — URINALYSIS COMPLETE WITH MICROSCOPIC (ARMC ONLY)
Bacteria, UA: NONE SEEN
Bilirubin Urine: NEGATIVE
Glucose, UA: 50 mg/dL — AB
Hgb urine dipstick: NEGATIVE
Ketones, ur: NEGATIVE mg/dL
Leukocytes, UA: NEGATIVE
Nitrite: NEGATIVE
Protein, ur: 100 mg/dL — AB
Specific Gravity, Urine: 1.004 — ABNORMAL LOW (ref 1.005–1.030)
Squamous Epithelial / LPF: NONE SEEN
pH: 7 (ref 5.0–8.0)

## 2016-03-21 LAB — ETHANOL: Alcohol, Ethyl (B): <5 mg/dL (ref <5)

## 2016-03-21 LAB — TROPONIN I: Troponin I: <0.03 ng/mL (ref <0.03)

## 2016-03-21 LAB — BRAIN NATRIURETIC PEPTIDE: B Natriuretic Peptide: 198 pg/mL — ABNORMAL HIGH (ref 0.0–100.0)

## 2016-03-21 MED ORDER — LABETALOL HCL 5 MG/ML IV SOLN
20.0000 mg | Freq: Once | INTRAVENOUS | Status: AC
  Administered 2016-03-21: 20 mg via INTRAVENOUS

## 2016-03-21 MED ORDER — NICARDIPINE HCL IN NACL 20-0.86 MG/200ML-% IV SOLN
INTRAVENOUS | Status: AC
  Administered 2016-03-21: 3 mg/h via INTRAVENOUS
  Filled 2016-03-21: qty 200

## 2016-03-21 MED ORDER — LABETALOL HCL 5 MG/ML IV SOLN
INTRAVENOUS | Status: AC
  Administered 2016-03-21: 20 mg via INTRAVENOUS
  Filled 2016-03-21: qty 4

## 2016-03-21 MED ORDER — NICARDIPINE HCL IN NACL 20-0.86 MG/200ML-% IV SOLN
3.0000 mg/h | Freq: Once | INTRAVENOUS | Status: AC
  Administered 2016-03-21: 3 mg/h via INTRAVENOUS
  Administered 2016-03-22: 10 mg/h via INTRAVENOUS

## 2016-03-21 NOTE — ED Provider Notes (Addendum)
[facility] Emergency Department Provider Note  ____________________________________________   I have reviewed the triage vital signs and the nursing notes.   HISTORY  Chief Complaint Hypertension and Weakness    HPI Richard Alvarado is a 75 y.o. male who presents today complaining of having had unsteady gait. He states both his legs felt weak for a few minutes. This started immediately prior to coming in. The patient has a history of hypertension and is on multiple hypertensive agencies. He states he does take his medications. He denies any drug or alcohol abuse. He takes baby aspirin only as an anticoagulant. He has a history of "mini strokes in the past". He denies any focal numbness or weakness. He just felt both his legs were weak. He is feels that they are back to his baseline. I did have a headache or stiff neck. Denies any difficulty talking or speaking. Denies any change in vision or hearing. Denies close his injury or fall. EMS and coated with a very high blood pressure of 260/160, I did ask them to give him percutaneous nitroglycerin which they did do on the way in.   At this time he has no complaints.  Past Medical History:  Diagnosis Date  . Cerebral atherosclerosis    CAROTID DOPPLER, 09/07/2009 - RIGHT AND LEFT CCAs-small-moderate amount of irregular mixed density plaque, no significant evidence of diameter reduction; RIGHT AND LEFT ICAs-moderate amount irregular mixed density plaque, no evidence of significant diameter reduction  . CVA (cerebral vascular accident) ([place]) 1994   Left brain  . History of CVA (cerebrovascular accident) 04/24/2013  . Hypertension    RENAL DOPPLER, 03/08/2009 - normal  . Hypertension in pregnancy, preeclampsia, severe 04/24/2013  . Obstructive sleep apnea 09/26/2005   AHI-19.46, during REM-36.88  . Pacemaker St. Jude victory, dual chamber, 2006 04/24/2013  . Pericardial effusion    2D ECHO, 03/07/2011 - EF >70%, normal   . Second degree heart block 04/24/2013    Patient Active Problem List   Diagnosis Date Noted  . Diabetes mellitus type 2 in nonobese ([place]) 03/19/2016  . Pacemaker battery depletion 11/14/2014  . Hyperlipidemia 11/01/2013  . Severe hypertension 04/24/2013  . Pericardial effusion secondary to minoxidil 04/24/2013  . History of CVA (cerebrovascular accident) 04/24/2013  . Second degree heart block 04/24/2013  . Pacemaker St. Jude victory, dual chamber, 2006 04/24/2013  . Cervicalgia 01/01/2011  . Muscle weakness (generalized) 01/01/2011    Past Surgical History:  Procedure Laterality Date  . EP IMPLANTABLE DEVICE N/A 11/21/2014   Procedure: PPM/BIV PPM Generator Changeout;  Surgeon: [name], MD;  Location: [facility];  Service: Cardiovascular;  Laterality: N/A;  . NM MYOVIEW LTD  03/31/2012   Normal study, no ECG changes, EKG negative for ischemia, post-stress EF 54%  . PACEMAKER INSERTION  05/09/2005   St. Jude [place], [place] #[id number], serial [id number]    Prior to Admission medications   Medication Sig Start Date End Date Taking? Authorizing Provider  amLODipine (NORVASC) 5 MG tablet Take 7.5 mg by mouth daily. 10/29/13  Yes Historical Provider, MD  aspirin EC 81 MG tablet Take 81 mg by mouth at bedtime.   Yes Historical Provider, MD  atorvastatin (LIPITOR) 80 MG tablet Take 80 mg by mouth at bedtime.   Yes Historical Provider, MD  cloNIDine (CATAPRES) 0.2 MG tablet Take 0.2 mg by mouth 2 (two) times daily.   Yes Historical Provider, MD  furosemide (LASIX) 40 MG tablet Take 40 mg by mouth daily.  Yes Historical Provider, MD  glipiZIDE-metformin (METAGLIP) 5-500 MG per tablet Take 2 tablets by mouth 2 (two) times daily. 04/02/14  Yes Historical Provider, MD  KLOR-CON 10 10 MEQ tablet TAKE 1 TABLET BY MOUTH 3 TIMES A DAY 11/06/15  Yes Mihai Croitoru, MD  losartan (COZAAR) 100 MG tablet Take 200 mg by mouth daily.    Yes Historical Provider, MD  nebivolol (BYSTOLIC) 10 MG  tablet Take 10 mg by mouth daily.   Yes Historical Provider, MD  niacin (NIASPAN) 500 MG CR tablet Take 500 mg by mouth at bedtime.   Yes Historical Provider, MD    Allergies Review of patient's allergies indicates no known allergies.  Family History  Problem Relation Age of Onset  . CVA Mother   . Hypertension Sister   . Heart disease Maternal Grandmother     Social History Social History  Substance Use Topics  . Smoking status: Former Smoker    Quit date: 06/09/1999  . Smokeless tobacco: Never Used  . Alcohol use No    Review of Systems Constitutional: No fever/chills Eyes: No visual changes. ENT: No sore throat. No stiff neck no neck pain Cardiovascular: Denies chest pain. Respiratory: Denies shortness of breath. Gastrointestinal:   no vomiting.  No diarrhea.  No constipation. Genitourinary: Negative for dysuria. Musculoskeletal: Negative lower extremity swelling Skin: Negative for rash. Neurological: Negative for severe headaches, focal weakness or numbness. 10-point ROS otherwise negative.  ____________________________________________   PHYSICAL EXAM:  VITAL SIGNS: ED Triage Vitals  Enc Vitals Group     BP 03/21/16 2158 (!) 260/145     Pulse Rate 03/21/16 2158 95     Resp 03/21/16 2158 (!) 22     Temp 03/21/16 2158 98.6 F (37 C)     Temp Source 03/21/16 2158 Oral     SpO2 03/21/16 2158 95 %     Weight 03/21/16 2220 195 lb (88.5 kg)     Height 03/21/16 2220 5\' 9"  (1.753 m)     Head Circumference --      Peak Flow --      Pain Score 03/21/16 2220 0     Pain Loc --      Pain Edu? --      Excl. in [place]? --     Constitutional: Alert and oriented. Well appearing and in no acute distress. Eyes: Conjunctivae are normal. PERRL. EOMI. Head: Atraumatic. Nose: No congestion/rhinnorhea. Mouth/Throat: Mucous membranes are moist.  Oropharynx non-erythematous. Neck: No stridor.   Nontender with no meningismus Cardiovascular: Normal rate, regular rhythm. Grossly  normal heart sounds.  Good peripheral circulation. Respiratory: Normal respiratory effort.  No retractions. Lungs CTAB. Abdominal: Soft and nontender. No distention. No guarding no rebound Back:  There is no focal tenderness or step off.  there is no midline tenderness there are no lesions noted. there is no CVA tenderness Musculoskeletal: No lower extremity tenderness, no upper extremity tenderness. No joint effusions, no DVT signs strong distal pulses no edema Neurologic:  Cranial nerves II through XII are grossly intact 5 out of 5 strength bilateral upper and lower extremity. Finger to nose within normal limits heel to shin within normal limits, speech is normal with no word finding difficulty or dysarthria, reflexes symmetric, pupils are equally round and reactive to light, there is no pronator drift, sensation is normal, vision is intact to confrontation, gait is deferred, there is no nystagmus, normal neurologic exam Skin:  Skin is warm, dry and intact. No rash noted. Psychiatric: Mood and  affect are normal. Speech and behavior are normal.  ____________________________________________   LABS (all labs ordered are listed, but only abnormal results are displayed)  Labs Reviewed  COMPREHENSIVE METABOLIC PANEL - Abnormal; Notable for the following:       Result Value   Potassium 3.0 (*)    CO2 21 (*)    Glucose, Bld 169 (*)    Total Protein 9.4 (*)    All other components within normal limits  CBC WITH DIFFERENTIAL/PLATELET - Abnormal; Notable for the following:    RBC 6.12 (*)    MCV 76.7 (*)    MCH 24.5 (*)    MCHC 31.9 (*)    All other components within normal limits  URINALYSIS COMPLETEWITH MICROSCOPIC ([facility] ONLY) - Abnormal; Notable for the following:    Color, Urine COLORLESS (*)    APPearance CLEAR (*)    Glucose, UA 50 (*)    Specific Gravity, Urine 1.004 (*)    Protein, ur 100 (*)    All other components within normal limits  TROPONIN I  ETHANOL  BRAIN NATRIURETIC  PEPTIDE  URINE DRUG SCREEN, QUALITATIVE ([facility] ONLY)  PROTIME-INR   ____________________________________________  EKG  I personally interpreted any EKGs ordered by me or triage Sinus rhythm right bundle branch block noted heart rate 98 bpm, normal axis. ____________________________________________  [id number]  I reviewed any imaging ordered by me or triage that were performed during my shift and, if possible, patient and/or family made aware of any abnormal findings. ____________________________________________   PROCEDURES  Procedure(s) performed: None  Procedures  Critical Care performed: CRITICAL CARE Performed by: Schuyler Amor   Total critical care time: 55 minutes  Critical care time was exclusive of separately billable procedures and treating other patients.  Critical care was necessary to treat or prevent imminent or life-threatening deterioration.  Critical care was time spent personally by me on the following activities: development of treatment plan with patient and/or surrogate as well as nursing, discussions with consultants, evaluation of patient's response to treatment, examination of patient, obtaining history from patient or surrogate, ordering and performing treatments and interventions, ordering and review of laboratory studies, ordering and review of radiographic studies, pulse oximetry and re-evaluation of patient's condition.   ____________________________________________   INITIAL IMPRESSION / ASSESSMENT AND PLAN / ED COURSE  Pertinent labs & imaging results that were available during my care of the patient were reviewed by me and considered in my medical decision making (see chart for details).  Patient asymptomatic upon arrival except for very elevated blood pressure 260/145 which is actually improvement what he had from EMS. I gave 20 of labetalol 2, blood pressure came down to 206/130 baseline pressures in the 130s to 160s and looks like from  prior notes. CT scan shows 2 small head bleeds. Patient with no evidence of shift or significant edema per EMS. I have paged neurosurgery at [facility] at [phone]. We are starting a Cardene drip. Patient remains neurologically intact.  ----------------------------------------- 11:10 PM on 03/21/2016 -----------------------------------------  Discussed with Dr. Sharmaine Base, (?SPELLING) who is a Publishing rights manager. He feels that he does not need to be involved in this case. He has to call neurology. We have paged neurology. Patient remains neurologically intact.  ----------------------------------------- 11:18 PM on 03/21/2016 -----------------------------------------  Discussed with Dr. Shon Hale, of [facility] who agrees with management, he agrees with our Cardene drip, our goal of blood pressure in the 180s, currently is 186 which is coming down nicely. We will obtain a ICU bed  -----------------------------------------  12:41 AM on 03/22/2016 -----------------------------------------  Blood pressure 166/88 which is a good target for him. Patient holding steady. No acute neurologic complaints. Reassuring exam. We are awaiting a bed.  Dr Edd Fabian to watch patient pending transfer.   Clinical Course   ____________________________________________   FINAL CLINICAL IMPRESSION(S) / ED DIAGNOSES  Final diagnoses:  HTN (hypertension)      This chart was dictated using voice recognition software.  Despite best efforts to proofread,  errors can occur which can change meaning.      Schuyler Amor, MD 03/21/16 [place], MD 03/21/16 [place], MD 03/21/16 [place], MD 03/22/16 [phone]

## 2016-03-21 NOTE — ED Notes (Signed)
Patient to CT at this time

## 2016-03-21 NOTE — Telephone Encounter (Signed)
Labs received from patient's primary care - Hgb A1c.  Per [facility] - order lipid and cmet.  Labs ordered and mailed to patient.

## 2016-03-21 NOTE — ED Notes (Signed)
Patieent continues to be symptom free. Denies headache, chest pain, SOB, visual changes and weakness. NIHSS continues to be negative. Patient to be transferred to Neuro ICU at [facility]; no room assignment as of yet. Patient updated on transfer POC and is in agreement. Cardene gtt continues to infuse at 5mg /hr; current SBP in the 170s. NTG paste remains in place as well per MD order. Will continue to monitor.

## 2016-03-21 NOTE — ED Triage Notes (Signed)
Patient presents to ED 5 from a football game. EMS reports that patient stood up and began "stumbling around like he was drunk". Patient with reported ataxic gait and weakness; had to be assisted by one of the team coaches. EMS called; patient found to be markedly hypertensive at 280/180. EMS placed NTG 1" paste; BP with minimal change. Patient presents to the ED; noted to be grossly NI; no weakness, headache, facial droop, or pronator drift noted; NIHSS negative. MD called to bedside upon arrival.

## 2016-03-22 ENCOUNTER — Inpatient Hospital Stay (HOSPITAL_COMMUNITY): Payer: Medicare Other

## 2016-03-22 ENCOUNTER — Inpatient Hospital Stay (HOSPITAL_COMMUNITY)
Admission: AD | Admit: 2016-03-22 | Discharge: 2016-03-24 | DRG: 065 | Disposition: A | Discharge status: Rehabilitation Facility | Payer: Medicare Other | Source: Other Acute Inpatient Hospital | Attending: Neurology | Admitting: Neurology

## 2016-03-22 DIAGNOSIS — Z79899 Other long term (current) drug therapy: Secondary | ICD-10-CM | POA: Diagnosis not present

## 2016-03-22 DIAGNOSIS — G8194 Hemiplegia, unspecified affecting left nondominant side: Secondary | ICD-10-CM | POA: Diagnosis present

## 2016-03-22 DIAGNOSIS — I10 Essential (primary) hypertension: Secondary | ICD-10-CM | POA: Diagnosis not present

## 2016-03-22 DIAGNOSIS — R739 Hyperglycemia, unspecified: Secondary | ICD-10-CM | POA: Diagnosis present

## 2016-03-22 DIAGNOSIS — G4733 Obstructive sleep apnea (adult) (pediatric): Secondary | ICD-10-CM

## 2016-03-22 DIAGNOSIS — Z950 Presence of cardiac pacemaker: Secondary | ICD-10-CM | POA: Diagnosis not present

## 2016-03-22 DIAGNOSIS — E119 Type 2 diabetes mellitus without complications: Secondary | ICD-10-CM

## 2016-03-22 DIAGNOSIS — E785 Hyperlipidemia, unspecified: Secondary | ICD-10-CM | POA: Diagnosis not present

## 2016-03-22 DIAGNOSIS — I619 Nontraumatic intracerebral hemorrhage, unspecified: Secondary | ICD-10-CM | POA: Diagnosis not present

## 2016-03-22 DIAGNOSIS — I610 Nontraumatic intracerebral hemorrhage in hemisphere, subcortical: Principal | ICD-10-CM | POA: Diagnosis present

## 2016-03-22 DIAGNOSIS — Z8249 Family history of ischemic heart disease and other diseases of the circulatory system: Secondary | ICD-10-CM

## 2016-03-22 DIAGNOSIS — E7800 Pure hypercholesterolemia, unspecified: Secondary | ICD-10-CM | POA: Diagnosis present

## 2016-03-22 DIAGNOSIS — I672 Cerebral atherosclerosis: Secondary | ICD-10-CM | POA: Diagnosis present

## 2016-03-22 DIAGNOSIS — I69393 Ataxia following cerebral infarction: Secondary | ICD-10-CM | POA: Diagnosis not present

## 2016-03-22 DIAGNOSIS — Z8673 Personal history of transient ischemic attack (TIA), and cerebral infarction without residual deficits: Secondary | ICD-10-CM

## 2016-03-22 DIAGNOSIS — E876 Hypokalemia: Secondary | ICD-10-CM

## 2016-03-22 DIAGNOSIS — R279 Unspecified lack of coordination: Secondary | ICD-10-CM

## 2016-03-22 DIAGNOSIS — Z87891 Personal history of nicotine dependence: Secondary | ICD-10-CM | POA: Diagnosis not present

## 2016-03-22 DIAGNOSIS — I169 Hypertensive crisis, unspecified: Secondary | ICD-10-CM | POA: Diagnosis not present

## 2016-03-22 DIAGNOSIS — Z7982 Long term (current) use of aspirin: Secondary | ICD-10-CM | POA: Diagnosis not present

## 2016-03-22 DIAGNOSIS — I441 Atrioventricular block, second degree: Secondary | ICD-10-CM | POA: Diagnosis present

## 2016-03-22 DIAGNOSIS — I161 Hypertensive emergency: Secondary | ICD-10-CM | POA: Diagnosis not present

## 2016-03-22 DIAGNOSIS — R270 Ataxia, unspecified: Secondary | ICD-10-CM | POA: Diagnosis not present

## 2016-03-22 DIAGNOSIS — R262 Difficulty in walking, not elsewhere classified: Secondary | ICD-10-CM | POA: Diagnosis not present

## 2016-03-22 DIAGNOSIS — I639 Cerebral infarction, unspecified: Secondary | ICD-10-CM

## 2016-03-22 DIAGNOSIS — Z823 Family history of stroke: Secondary | ICD-10-CM | POA: Diagnosis not present

## 2016-03-22 DIAGNOSIS — E1149 Type 2 diabetes mellitus with other diabetic neurological complication: Secondary | ICD-10-CM | POA: Diagnosis not present

## 2016-03-22 LAB — LIPID PANEL
Cholesterol: 111 mg/dL (ref 0–200)
HDL: 34 mg/dL — ABNORMAL LOW (ref >40)
LDL Cholesterol: 68 mg/dL (ref 0–99)
Total CHOL/HDL Ratio: 3.3 RATIO
Triglycerides: 43 mg/dL (ref <150)
VLDL: 9 mg/dL (ref 0–40)

## 2016-03-22 LAB — GLUCOSE, CAPILLARY
Glucose-Capillary: 128 mg/dL — ABNORMAL HIGH (ref 65–99)
Glucose-Capillary: 169 mg/dL — ABNORMAL HIGH (ref 65–99)
Glucose-Capillary: 219 mg/dL — ABNORMAL HIGH (ref 65–99)
Glucose-Capillary: 157 mg/dL — ABNORMAL HIGH (ref 65–99)

## 2016-03-22 LAB — BASIC METABOLIC PANEL
Anion gap: 8 (ref 5–15)
BUN: 11 mg/dL (ref 6–20)
CO2: 24 mmol/L (ref 22–32)
Calcium: 9.2 mg/dL (ref 8.9–10.3)
Chloride: 107 mmol/L (ref 101–111)
Creatinine, Ser: 1.08 mg/dL (ref 0.61–1.24)
GFR calc Af Amer: >60 mL/min (ref >60)
GFR calc non Af Amer: >60 mL/min (ref >60)
Glucose, Bld: 149 mg/dL — ABNORMAL HIGH (ref 65–99)
Potassium: 3.1 mmol/L — ABNORMAL LOW (ref 3.5–5.1)
Sodium: 139 mmol/L (ref 135–145)

## 2016-03-22 LAB — MRSA PCR SCREENING: MRSA by PCR: NEGATIVE

## 2016-03-22 MED ORDER — PANTOPRAZOLE SODIUM 40 MG PO TBEC
40.0000 mg | DELAYED_RELEASE_TABLET | Freq: Every day | ORAL | Status: DC
  Administered 2016-03-22 – 2016-03-24 (×3): 40 mg via ORAL
  Filled 2016-03-22 (×3): qty 1

## 2016-03-22 MED ORDER — FUROSEMIDE 40 MG PO TABS
40.0000 mg | ORAL_TABLET | Freq: Every day | ORAL | Status: DC
  Administered 2016-03-22 – 2016-03-24 (×3): 40 mg via ORAL
  Filled 2016-03-22 (×3): qty 1

## 2016-03-22 MED ORDER — IOPAMIDOL (ISOVUE-370) INJECTION 76%
INTRAVENOUS | Status: AC
  Administered 2016-03-22: 50 mL
  Filled 2016-03-22: qty 50

## 2016-03-22 MED ORDER — LABETALOL HCL 5 MG/ML IV SOLN
10.0000 mg | INTRAVENOUS | Status: DC | PRN
  Filled 2016-03-22: qty 4

## 2016-03-22 MED ORDER — NEBIVOLOL HCL 10 MG PO TABS
10.0000 mg | ORAL_TABLET | Freq: Every day | ORAL | Status: DC
  Administered 2016-03-22 – 2016-03-24 (×3): 10 mg via ORAL
  Filled 2016-03-22 (×4): qty 1

## 2016-03-22 MED ORDER — NICARDIPINE HCL IN NACL 20-0.86 MG/200ML-% IV SOLN
0.0000 mg/h | INTRAVENOUS | Status: DC
  Administered 2016-03-22 (×2): 6 mg/h via INTRAVENOUS
  Administered 2016-03-22: 4 mg/h via INTRAVENOUS
  Administered 2016-03-22: 10 mg/h via INTRAVENOUS
  Filled 2016-03-22 (×4): qty 200

## 2016-03-22 MED ORDER — POTASSIUM CHLORIDE 20 MEQ PO PACK
20.0000 meq | PACK | Freq: Two times a day (BID) | ORAL | Status: DC
Start: 2016-03-22 — End: 2016-03-23
  Administered 2016-03-22 – 2016-03-23 (×3): 20 meq via ORAL
  Filled 2016-03-22 (×4): qty 1

## 2016-03-22 MED ORDER — CLONIDINE HCL 0.1 MG PO TABS
0.2000 mg | ORAL_TABLET | Freq: Two times a day (BID) | ORAL | Status: DC
  Administered 2016-03-22 – 2016-03-24 (×5): 0.2 mg via ORAL
  Filled 2016-03-22 (×5): qty 2

## 2016-03-22 MED ORDER — ACETAMINOPHEN 325 MG PO TABS: 650.0000 mg | ORAL_TABLET | ORAL | Status: DC | PRN

## 2016-03-22 MED ORDER — STROKE: EARLY STAGES OF RECOVERY BOOK
Freq: Once | Status: AC
  Administered 2016-03-22: 04:00:00
  Filled 2016-03-22: qty 1

## 2016-03-22 MED ORDER — ACETAMINOPHEN 650 MG RE SUPP: 650.0000 mg | RECTAL | Status: DC | PRN

## 2016-03-22 MED ORDER — AMLODIPINE BESYLATE 5 MG PO TABS
7.5000 mg | ORAL_TABLET | Freq: Every day | ORAL | Status: DC
  Administered 2016-03-22 – 2016-03-23 (×2): 7.5 mg via ORAL
  Filled 2016-03-22 (×2): qty 1

## 2016-03-22 MED ORDER — POTASSIUM CHLORIDE 20 MEQ/15ML (10%) PO SOLN
40.0000 meq | Freq: Once | ORAL | Status: AC
  Administered 2016-03-22: 40 meq via ORAL
  Filled 2016-03-22: qty 30

## 2016-03-22 MED ORDER — NICARDIPINE HCL IN NACL 20-0.86 MG/200ML-% IV SOLN
INTRAVENOUS | Status: AC
  Administered 2016-03-22: 10 mg/h via INTRAVENOUS
  Filled 2016-03-22: qty 200

## 2016-03-22 MED ORDER — LOSARTAN POTASSIUM 50 MG PO TABS
200.0000 mg | ORAL_TABLET | Freq: Every day | ORAL | Status: DC
  Administered 2016-03-22: 200 mg via ORAL
  Filled 2016-03-22: qty 4

## 2016-03-22 MED ORDER — ENALAPRILAT 1.25 MG/ML IV SOLN: 1.2500 mg | Freq: Four times a day (QID) | INTRAVENOUS | Status: DC | PRN

## 2016-03-22 MED ORDER — SENNOSIDES-DOCUSATE SODIUM 8.6-50 MG PO TABS
1.0000 | ORAL_TABLET | Freq: Two times a day (BID) | ORAL | Status: DC
  Administered 2016-03-22 – 2016-03-24 (×5): 1 via ORAL
  Filled 2016-03-22 (×5): qty 1

## 2016-03-22 NOTE — ED Notes (Signed)
Continues to rest quietly in room with NAD noted; sleeping at this time; easy to arouse to verbal and tactile stimulation. Neuro exam remains unremarkable. Denies pain. Awaiting transport to [facility]; [facility] en route. Warm blankets provided. Will continue to monitor.

## 2016-03-22 NOTE — ED Notes (Signed)
Patient continues to await bed assignment at [facility]. No acute changes noted form previous assessments. Patient continues to deny neurological symptoms; grossly NI on exam. NIHSS remains negative. Cardene gtt infusing per MD order at 7.5mg /hr; SBPs in the 160s; goal is a SBP of 150, however he wants to titrate the medication very slowly, hence the reason that the medication is not being titrated q 5 minutes as the [facility] order indicates. MD aware of the current BP of 166/68 and the current Cardene infusion rate; wants the medication to remain at the current rate at this time.

## 2016-03-22 NOTE — ED Notes (Signed)
Transfer center returned call to ED; 20 minute ETA received. Patient and family updated.

## 2016-03-22 NOTE — Progress Notes (Signed)
STROKE TEAM PROGRESS NOTE   HISTORY OF PRESENT ILLNESS (per record) History is obtained directly from the patient who is an excellent historian. I also discussed the patient with the transferring ED MD Burlene Arnt) and with the transport team on their arrival at [facility] with the patient.   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 When he stood up after the game, he felt like both of his legs were asleep and he had difficulty standing and walking. He had to be assisted by one of the coaches. They called 911 and EMS arrived to find initial blood pressure to 280/180. After discussion with the ED physician at [facility], they placed 1 inch of nitroglycerin paste with minimal change in his blood pressure. He was taken to [facility] where he was noted to have a normal neurological examination CT scan of the head was obtained and showed a small acute intracranial hemorrhage involving the right basal ganglia. He was given 40 mg of labetalol in the emergency department, again with little response in his blood pressure. At this point he was placed on a nicardipine infusion which is gradually been titrated to a dose of 10 mg per hour with good response. He is now transferred to [facility] for further management.  The patient reports that he has not had any headache. He denies any vision changes. He's had no difficulty talking or swallowing. He presently denies any weakness or numbness, and states that his lower extremity symptoms only lasted for a few minutes.   SUBJECTIVE (INTERVAL HISTORY) His RN is at the bedside.  Overall he feels his condition is stable. His BP is under control with cardene. Resumed his home BP meds. He stated that he has "white coat" syndrome. He still has mild left hemiparesis.    OBJECTIVE Temp:  [98.2 F (36.8 C)-98.6 F (37 C)] 98.6 F (37 C) (10/14 0237) Pulse Rate:  [72-95] 75 (10/14 0700) Cardiac Rhythm: Heart block  (10/14 0340) Resp:  [9-24] 9 (10/14 0700) BP: (140-260)/(66-145) 152/75 (10/14 0700) SpO2:  [92 %-100 %] 98 % (10/14 0700) Weight:  [88.5 kg (195 lb)-89.1 kg (196 lb 6.9 oz)] 89.1 kg (196 lb 6.9 oz) (10/14 0340)  CBC:  Recent Labs Lab 03/21/16 2208  WBC 5.2  NEUTROABS 2.3  HGB 15.0  HCT 47.0  MCV 76.7*  PLT 123456    Basic Metabolic Panel:  Recent Labs Lab 03/21/16 2208 03/22/16 0528  NA 136 139  K 3.0* 3.1*  CL 105 107  CO2 21* 24  GLUCOSE 169* 149*  BUN 12 11  CREATININE 1.05 1.08  CALCIUM 9.6 9.2    Lipid Panel:    Component Value Date/Time   CHOL 111 03/22/2016 0528   TRIG 43 03/22/2016 0528   HDL 34 (L) 03/22/2016 0528   CHOLHDL 3.3 03/22/2016 0528   VLDL 9 03/22/2016 0528   LDLCALC 68 03/22/2016 0528   HgbA1c:  Lab Results  Component Value Date   HGBA1C 7.0 (H) 04/27/2014   Urine Drug Screen:    Component Value Date/Time   LABOPIA NONE DETECTED 03/21/2016 2208   COCAINSCRNUR NONE DETECTED 03/21/2016 2208   LABBENZ NONE DETECTED 03/21/2016 2208   AMPHETMU NONE DETECTED 03/21/2016 2208   THCU NONE DETECTED 03/21/2016 2208   LABBARB NONE DETECTED 03/21/2016 2208      IMAGING I have personally reviewed the radiological images below and agree with the radiology interpretations.  Ct Head Wo Contrast 03/21/2016  1. Acute intraparenchymal hemorrhage centered at the right basal ganglia measuring 14 x 21 x 20 mm  (estimated volume 4 cc). Mild localized edema without significant mass effect. No intraventricular extension.  2. Probable additional punctate 6 mm parenchymal hemorrhage within the left lentiform nucleus, no associated edema. Underlying hypertensive etiology is suspected.  3. Generalized age-related cerebral atrophy with moderate chronic microvascular ischemic disease.   Dg Chest Port 1 View 03/21/2016 No acute pulmonary process identified.   CT repeat pending  CTA head and neck pending  TTE pending    PHYSICAL EXAM Physical  exam  Temp:  [98.2 F (36.8 C)-98.6 F (37 C)] 98.6 F (37 C) (10/14 0237) Pulse Rate:  [72-95] 76 (10/14 0900) Resp:  [9-24] 20 (10/14 0900) BP: (140-260)/(66-145) 158/81 (10/14 0900) SpO2:  [92 %-100 %] 98 % (10/14 0900) Weight:  [195 lb (88.5 kg)-196 lb 6.9 oz (89.1 kg)] 196 lb 6.9 oz (89.1 kg) (10/14 0340)  General - Well nourished, well developed, in no apparent distress.  Ophthalmologic - Fundi not visualized due to small pupils.  Cardiovascular - Regular rate and rhythm.  Mental Status -  Level of arousal and orientation to time, place, and person were intact. Language including expression, naming, repetition, comprehension was assessed and found intact. Fund of Knowledge was assessed and was intact.  Cranial Nerves II - XII - II - Visual field intact OU. III, IV, VI - Extraocular movements intact. V - Facial sensation intact bilaterally. VII - left nasolabial fold flattening. VIII - Hearing & vestibular intact bilaterally. X - Palate elevates symmetrically. XI - Chin turning & shoulder shrug intact bilaterally. XII - Tongue protrusion intact.  Motor Strength - The patient's strength was normal in all extremities except left pronator drift and left iliopsoas 4+/5.  Bulk was normal and fasciculations were absent.   Motor Tone - Muscle tone was assessed at the neck and appendages and was normal.  Reflexes - The patient's reflexes were 1+ in all extremities and he had no pathological reflexes.  Sensory - Light touch, temperature/pinprick were assessed and were symmetrical.    Coordination - The patient had mild ataxia on the left FTN a little out of proportion of his left UE weakness.  Tremor was absent.  Gait and Station - deferred due to safety concerns.   ASSESSMENT/PLAN [name] is a 75 y.o. male with history of second-degree heart block, permanent pacemaker, obstructive sleep apnea, hypertension, previous stroke 2014, and carotid artery disease  presenting with lower extremity numbness and elevated blood pressure. He did not receive IV t-PA due to [place].   Stroke:  Right BG small ICH and questionable left BG tiny ICH, secondary to hypertension.  Resultant  Mild left hemiparesis  MRI - not indicated - PPM  MRA - not indicated - PPM  CT -  Acute small ICH at the right BG and questionable left BG tiny ICH   Repeat CT pending  CTA H&N - pending  2D Echo - pending  LDL - 68  HgbA1c - pending  VTE prophylaxis - SCDs  Diet Heart Room service appropriate? Yes; Fluid consistency: Thin  aspirin 81 mg daily prior to admission, now on No antithrombotic secondary to hemorrhage   Ongoing aggressive stroke risk factor management  Therapy recommendations:  pending  Disposition: Pending  Hypertension  Stable   BP goal < 160  On cardene now, wean off as able  Resume home medication  Long-term BP goal normotensive  Hyperlipidemia  Home  meds:  Lipitor 80 mg daily prior to admission. Not resumed.  LDL 68, goal < 70  Continue statin at discharge  Other Stroke Risk Factors  Advanced age  Former smoker. Quit 2000.  Overweight, Body mass index is 28.59 kg/m., recommend weight loss, diet and exercise as appropriate   Hx stroke/TIA  Family hx stroke (mother)  Obstructive sleep apnea  Other Active Problems  hyperglycemia - await hemoglobin A1c  Hypokalemia - 3.1 (Lasix 40 mg daily) Supplement - recheck in AM  Hospital day # 0  This patient is critically ill due to [place] with hypertensive emergency and at significant risk of neurological worsening, death form recurrent bleeding, heart failure, cerebral edema and herniation. This patient's care requires constant monitoring of vital signs, hemodynamics, respiratory and cardiac monitoring, review of multiple databases, neurological assessment, discussion with family, other specialists and medical decision making of high complexity. I spent 40 minutes of neurocritical  care time in the care of this patient.  Rosalin Hawking, MD PhD Stroke Neurology 03/22/2016 9:48 AM     To contact Stroke Continuity provider, please refer to [email or web address] After hours, contact General Neurology

## 2016-03-22 NOTE — H&P (Signed)
Neurology Admission H&P   Requesting provider: Jeffie Pollock, ME  CC: "i felt like my legs fell asleep"  HPI: History is obtained directly from the patient who is an excellent historian. I also discussed the patient with the transferring ED MD Burlene Arnt) and with the transport team on their arrival at [facility] with the patient.   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 When he stood up after the game, he felt like both of his legs were asleep and he had difficulty standing and walking. He had to be assisted by one of the coaches. They called 911 and EMS arrived to find initial blood pressure to 280/180. After discussion with the ED physician at [facility], they placed 1 inch of nitroglycerin paste with minimal change in his blood pressure. He was taken to [facility] where he was noted to have a normal neurological examination CT scan of the head was obtained and showed a small acute intracranial hemorrhage involving the right basal ganglia. He was given 40 mg of labetalol in the emergency department, again with little response in his blood pressure. At this point he was placed on a nicardipine infusion which is gradually been titrated to a dose of 10 mg per hour with good response. He is now transferred to [facility] for further management.  The patient reports that he has not had any headache. He denies any vision changes. He's had no difficulty talking or swallowing. He presently denies any weakness or numbness, and states that his lower extremity symptoms only lasted for a few minutes.  PMH:  Past Medical History:  Diagnosis Date  . Cerebral atherosclerosis    CAROTID DOPPLER, 09/07/2009 - RIGHT AND LEFT CCAs-small-moderate amount of irregular mixed density plaque, no significant evidence of diameter reduction; RIGHT AND LEFT ICAs-moderate amount irregular mixed density plaque, no evidence of significant diameter reduction  . CVA (cerebral  vascular accident) ([place]) 1994   Left brain  . History of CVA (cerebrovascular accident) 04/24/2013  . Hypertension    RENAL DOPPLER, 03/08/2009 - normal  . Hypertension in pregnancy, preeclampsia, severe 04/24/2013  . Obstructive sleep apnea 09/26/2005   AHI-19.46, during REM-36.88  . Pacemaker [employer] Jude victory, dual chamber, 2006 04/24/2013  . Pericardial effusion    2D ECHO, 03/07/2011 - EF >70%, normal  . Second degree heart block 04/24/2013    PSH:  Past Surgical History:  Procedure Laterality Date  . EP IMPLANTABLE DEVICE N/A 11/21/2014   Procedure: PPM/BIV PPM Generator Changeout;  Surgeon: [name], MD;  Location: [place] [facility] LAB;  Service: Cardiovascular;  Laterality: N/A;  . NM MYOVIEW LTD  03/31/2012   Normal study, no ECG changes, EKG negative for ischemia, post-stress EF 54%  . PACEMAKER INSERTION  05/09/2005   St. Jude [place], [place] #[id number], serial [id number]    Family history: Family History  Problem Relation Age of Onset  . CVA Mother   . Hypertension Sister   . Heart disease Maternal Grandmother     Social history:  Social History   Social History  . Marital status: Married    Spouse name: N/A  . Number of children: N/A  . Years of education: N/A   Occupational History  . Not on file.   Social History Main Topics  . Smoking status: Former Smoker    Quit date: 06/09/1999  . Smokeless tobacco: Never Used  . Alcohol use No  . Drug use: No  . Sexual  activity: Not on file   Other Topics Concern  . Not on file   Social History Narrative  . No narrative on file    Current outpatient meds: No outpatient prescriptions have been marked as taking for the 03/22/16 encounter [facility]counter).    Current inpatient meds:  No current facility-administered medications for this encounter.     Allergies: No Known Allergies  ROS: As per HPI. A full 14-point review of systems was performed and is otherwise unremarkable.   PE:  T 98.36F     P 86       BP 153/75     RR 16     SpO2 97%  General: WDWN, no acute distress. AAO x4. Speech clear, no dysarthria. At times he seems to stumble over words but this is intermittent. No aphasia. Follows commands briskly. Affect is bright with congruent mood. Comportment is normal.  HEENT: Normocephalic. Neck supple without LAD. MMM, OP clear. Dentition good. Sclerae anicteric. No conjunctival injection.  CV: Regular, no murmur. Carotid pulses full and symmetric, no bruits. Distal pulses 2+ and symmetric. Pacer in place anterior R chest wall.  Lungs: CTAB.  Abdomen: Soft, non-distended, non-tender. Bowel sounds present x4.  Extremities: No C/C/E. Neuro:  CN: Pupils are equal and round. They are symmetrically reactive from 3-->2 mm. Visual fields are full to confrontation. EOMI notable for breakup of smooth pursuits without nystagmus. No reported diplopia. Facial sensation is intact to light touch. Face is symmetric at rest with normal strength and mobility. Hearing is intact to conversational voice. Palate elevates symmetrically and uvula is midline. Voice is normal in tone, pitch and quality. Bilateral SCM and trapezii are 5/5. Tongue is midline with normal bulk and mobility.  Motor: Normal bulk, tone, and strength. No tremor or other abnormal movements. No drift.  Sensation: Intact to light touch.   DTRs: 2+, symmetric. Toes downgoing bilaterally. No pathologic reflexes.  Coordination: Finger-to-nose is clumsy on the left, normal on the right. Finger taps are normal in amplitude and speed, no decrement.    Labs:  Lab Results  Component Value Date   WBC 5.2 03/21/2016   HGB 15.0 03/21/2016   HCT 47.0 03/21/2016   PLT 260 03/21/2016   GLUCOSE 169 (H) 03/21/2016   CHOL 116 04/27/2014   CHOL 109 04/27/2014   TRIG 65 04/27/2014   TRIG 70 04/27/2014   HDL 36 (L) 04/27/2014   HDL 36 (L) 04/27/2014   LDLCALC 67 04/27/2014   LDLCALC 59 04/27/2014   ALT 23 03/21/2016   AST 33 03/21/2016   NA 136  03/21/2016   K 3.0 (L) 03/21/2016   CL 105 03/21/2016   CREATININE 1.05 03/21/2016   BUN 12 03/21/2016   CO2 21 (L) 03/21/2016   TSH 2.837 01/04/2013   PSA <0.01 01/04/2013   INR 1.06 03/21/2016   HGBA1C 7.0 (H) 04/27/2014   Urine drug screen negative UA notable for glucose 50, spec gravity 1.004, protein 100 BNP 198 Troponin-I <0.03 Serum ethanol <5  Imaging: I have personally and independently reviewed the [facility] without contrast from the OSH last night. This shows a small area of acute hemorrhage in the right basal ganglia that measures 85mm x 32mm x 28 mm. There is mild surrounding edema, no shift or mass effect. There is moderate to severe diffuse chronic small vessel ischemic change in the bihemispheric white matter. Moderate diffuse generalized atrophy is present. There is a punctate area of hyperdensity in the left basal ganglia, likely calcification.  Assessment and Plan:  1. Intracerebral hemorrhage: This is acute. This is most likely hypertensive in etiology. Urine drug screen was negative. He denies illicit drug use, excessive use of nonsteroidal anti-inflammatories, and heavy alcohol use. At this point, his blood pressure is doing well with nicardipine infusion and this will be continued, titrating to goal systolic blood pressure 0000000 mmHg. Given that his initial systolic blood pressure was well above 220, I am not immediately bringing his blood pressure down to 140 mmHg or less. Blood pressure control can be gradually tightened over the next 12-24 hours and his outpatient antihypertensive regimen can be gradually reinstituted. Hold aspirin and all other antiplatelet agents for 7-10 days. Avoid therapeutic doses of systemic anticoagulation for 3-4 weeks. I will repeat head CT in approximately 24 hours for follow-up. He has a pacemaker that was placed about 10 years ago, so it is not likely that this is compatible with MRI and this will be deferred at this time.  2. Left upper  extremity clumsiness: He has mild clumsiness in the left arm that is acute, due to his intracerebral hemorrhage. OT to evaluate and treat as needed.  3. Mild hypokalemia: This will be repleted with 40 mEq of oral potassium chloride. Repeat basic metabolic panel after repletion.  This was discussed with the patient and he is in agreement with the plan as stated. I also discussed this with the patient's family, including wife and son. All of them were given the opportunity to ask any questions and these were addressed to their satisfaction.  The stroke team will assume care of the patient moving forward.

## 2016-03-22 NOTE — ED Notes (Signed)
Patient reporting that he needs to use the urinal. Family stepped out of the room. Patient has stood to void x 3 since arrival; no problems; states that he does not wish to be assisted. RN outside of the room; with patient in view from desk. Patient observed staggering backwards; sitting on the side of the bed at this time. Patient unable to get his feet under him. Family and this RN to bedside. Patient in personal socks at this time; unable to get feet under him to stand. Family and RN assisted patient into seated position at bedside. Patient denies dizziness or that he had fall backwards, rather patient states, "I slipped because of these socks. I wasn't dizzy and I didn't fall." Patient assisted back to a standing position and assisted with urinal use by RN. Patient states, "I have got this. I don't need no help". RN stressed that until we changed his socks, RN would feel better if her was assisted. Patient voided in urinal and started to sit back on the bed; feet once again noted to be slipping once again. Patient dropped urinal in the floor; urine spilled. Patient assisted back into bed and positioned for comfort. Urine dried from the floor. Patient states, "I know my family thinks I fell out of the bed but I didn't. I just slipped in these socks." Personal socks removed and patient placed in gripper socks. No further verbalized needs at this time. Will continue to monitor.

## 2016-03-22 NOTE — ED Notes (Signed)
[facility] called for patient report; provided by this RN. 15-20 minute ETA provided. This RN to call report to receiving nurse at [facility].

## 2016-03-22 NOTE — ED Notes (Signed)
Patient and family updated on transfer plan of care. Patient continues to report that he is experiencing no neurological symptoms. He has denies headaches, visual changes, and weakness since arrival. NIHSS remains negative; no noted speech changes, facial drooping, or pronator drift. No verbalized needs at this time. Will continue to monitor.

## 2016-03-22 NOTE — ED Notes (Signed)
[facility] departs at this time en route to [facility]. Family to follow via POV.

## 2016-03-22 NOTE — ED Notes (Signed)
Patient report called to 70M-14 nurse Marye Round, RN). Nurse advised of presenting c/o, assessment, treatment, and transfer POC to their facility. Meds given and currently still infusing discussed. Advised nurse to return call to this RN should any questions or concerns arise related to the care that the patient received at [facility].

## 2016-03-22 NOTE — ED Notes (Signed)
Call placed to transfer center to check on the status of transport. Truck is in service but currently unavailable at this time; returning from [facility] transfer. Kim/Rhonda with transfer center to return call to ED when truck en route to [facility].

## 2016-03-22 NOTE — ED Notes (Signed)
Patient with SBP back in the 180s despite medication at the current rate. MD made aware. Gtt rate increased to 10mg /hr at this time. Will continue to monitor.

## 2016-03-22 NOTE — Progress Notes (Signed)
Inpatient Rehabilitation  Per PT request, patient was screened by Zeniya Lapidus for appropriateness for an Inpatient Acute Rehab consult.  At this time we are recommending an Inpatient Rehab consult.  Please order if you are agreeable.    Darvis Croft, M.A., CCC/SLP Admission Coordinator  [facility]  Cell [phone]  

## 2016-03-22 NOTE — Evaluation (Signed)
Physical Therapy Evaluation Patient Details Name: Richard Alvarado MRN: [id number] DOB: October 05, 1940 Today's Date: 03/22/2016   History of Present Illness  75 y.o.malewith history of second-degree heart block, permanent pacemaker, obstructive sleep apnea, hypertension, previous stroke 2014, and carotid artery diseasepresenting with lower extremity numbnessand elevated blood pressure. Imaging revealed + Stroke: Right BG small ICH and questionable left BG tiny ICH,secondary to hypertension  Clinical Impression  Patient demonstrates deficits in functional mobility as indicated below. Will need continued skilled PT to address deficits and maixmize function. Will see as indicated and progress as tolerated. Given patients prior level of independence in conjunction with current deficits, feel patient will need comprehensive therapies upon acute discharge, recommend CIR.  OF NOTE:  patient with left lateral lean noted during ambulation attempt, upon reaching ~16 feet, patient LEs gave way completely and patient propped upon therapist knee and wall while nurses acquired chair and provided physical assist to elevate patient (+3) off of therapist knee into chair, then patient transfered from chair into recliner with moderate assist of 2 persons (inattention to Left side, multi cues for awarenes of objects) BP assessed 150s/70s    Follow Up Recommendations CIR;Supervision/Assistance - 24 hour    Equipment Recommendations  Other (comment) (TBD)    Recommendations for Other Services Rehab consult     Precautions / Restrictions Precautions Precautions: Fall      Mobility  Bed Mobility Overal bed mobility: Needs Assistance Bed Mobility: Supine to Sit     Supine to sit: Min assist     General bed mobility comments: min assist to come to EOB via UE pull to sit  Transfers Overall transfer level: Needs assistance Equipment used: 1 person hand held assist (wrap around support with gait  belt) Transfers: Sit to/from Stand Sit to Stand: Min assist         General transfer comment: min assist to power up to standing, noted use of bed against calves for stability in addition to HHA and gait belt support  Ambulation/Gait Ambulation/Gait assistance: Mod assist;Total assist (moderate assist for amb, total assist after 84ft LE buckled) Ambulation Distance (Feet): 16 Feet Assistive device: 1 person hand held assist (wrap around support with therapist providing lateral assist) Gait Pattern/deviations: Step-to pattern;Decreased stride length;Ataxic;Narrow base of support (left lateral lean noted) Gait velocity: decreased Gait velocity interpretation: Below normal speed for age/gender General Gait Details: patient with left lateral lean noted during ambulation attempt, upon reaching ~16 feet, patient LEs gave way completely and patient propped upon therapist knee and wall while nurses acquired chair and provided physical assist to elevate patient (+3) off of therapist knee into chair, then patient transfered from chair into recliner with moderate assist of 2 persons (inattention to Left side, multi cues for awarenes of objects)  Stairs            Wheelchair Mobility    Modified Rankin (Stroke Patients Only)       Balance Overall balance assessment: Needs assistance   Sitting balance-Leahy Scale: Fair       Standing balance-Leahy Scale: Poor (to zero)                               Pertinent Vitals/Pain      Home Living Family/patient expects to be discharged to:: Private residence Living Arrangements: Spouse/significant other Available Help at Discharge: Family Type of Home: House  Prior Function Level of Independence: Independent         Comments: was at football game prior to admission     Hand Dominance   Dominant Hand: Right    Extremity/Trunk Assessment               Lower Extremity Assessment: LLE  deficits/detail   LLE Deficits / Details: decreased sensation reported LLE     Communication   Communication: No difficulties  Cognition                            General Comments      Exercises     Assessment/Plan    PT Assessment Patient needs continued PT services  PT Problem List Decreased strength;Decreased activity tolerance;Decreased balance;Decreased mobility;Decreased coordination;Impaired sensation          PT Treatment Interventions DME instruction;Gait training;Stair training;Functional mobility training;Therapeutic activities;Therapeutic exercise;Balance training;Patient/family education    PT Goals (Current goals can be found in the Care Plan section)  Acute Rehab PT Goals Patient Stated Goal: to get better PT Goal Formulation: With patient Time For Goal Achievement: 04/05/16 Potential to Achieve Goals: Good    Frequency Min 4X/week   Barriers to discharge        Co-evaluation               End of Session Equipment Utilized During Treatment: Gait belt Activity Tolerance: Patient limited by fatigue;Other (comment) (LEs gave out during mobility causing near fall, +3 assist up) Patient left: in chair;with call bell/phone within reach;with chair alarm set Nurse Communication: Mobility status         Time: 1410-1436 PT Time Calculation (min) (ACUTE ONLY): 26 min   Charges:   PT Evaluation $PT Eval Moderate Complexity: 1 Procedure     PT G CodesDuncan Dull 2016/04/07, 5:42 PM Alben Deeds, [place] DPT  [phone]

## 2016-03-23 ENCOUNTER — Inpatient Hospital Stay (HOSPITAL_COMMUNITY): Payer: Medicare Other

## 2016-03-23 DIAGNOSIS — I161 Hypertensive emergency: Secondary | ICD-10-CM

## 2016-03-23 DIAGNOSIS — I610 Nontraumatic intracerebral hemorrhage in hemisphere, subcortical: Principal | ICD-10-CM

## 2016-03-23 DIAGNOSIS — E785 Hyperlipidemia, unspecified: Secondary | ICD-10-CM

## 2016-03-23 DIAGNOSIS — I639 Cerebral infarction, unspecified: Secondary | ICD-10-CM

## 2016-03-23 DIAGNOSIS — I69393 Ataxia following cerebral infarction: Secondary | ICD-10-CM

## 2016-03-23 LAB — BASIC METABOLIC PANEL
Anion gap: 7 (ref 5–15)
BUN: 10 mg/dL (ref 6–20)
CO2: 22 mmol/L (ref 22–32)
Calcium: 9 mg/dL (ref 8.9–10.3)
Chloride: 109 mmol/L (ref 101–111)
Creatinine, Ser: 0.92 mg/dL (ref 0.61–1.24)
GFR calc Af Amer: >60 mL/min (ref >60)
GFR calc non Af Amer: >60 mL/min (ref >60)
Glucose, Bld: 124 mg/dL — ABNORMAL HIGH (ref 65–99)
Potassium: 3.4 mmol/L — ABNORMAL LOW (ref 3.5–5.1)
Sodium: 138 mmol/L (ref 135–145)

## 2016-03-23 LAB — ECHOCARDIOGRAM COMPLETE
Height: 69.5 in
Weight: 3142.88 oz

## 2016-03-23 LAB — GLUCOSE, CAPILLARY: Glucose-Capillary: 125 mg/dL — ABNORMAL HIGH (ref 65–99)

## 2016-03-23 LAB — HEMOGLOBIN A1C
Hgb A1c MFr Bld: 6.9 % — ABNORMAL HIGH (ref 4.8–5.6)
Mean Plasma Glucose: 151 mg/dL

## 2016-03-23 MED ORDER — LABETALOL HCL 5 MG/ML IV SOLN: 10.0000 mg | INTRAVENOUS | Status: DC | PRN

## 2016-03-23 MED ORDER — AMLODIPINE BESYLATE 10 MG PO TABS
10.0000 mg | ORAL_TABLET | Freq: Every day | ORAL | Status: DC
  Administered 2016-03-24: 10 mg via ORAL
  Filled 2016-03-23: qty 1

## 2016-03-23 MED ORDER — POTASSIUM CHLORIDE CRYS ER 20 MEQ PO TBCR: 20.0000 meq | EXTENDED_RELEASE_TABLET | Freq: Two times a day (BID) | ORAL | Status: DC

## 2016-03-23 MED ORDER — POTASSIUM CHLORIDE CRYS ER 20 MEQ PO TBCR
20.0000 meq | EXTENDED_RELEASE_TABLET | Freq: Two times a day (BID) | ORAL | Status: DC
  Administered 2016-03-23 – 2016-03-24 (×2): 20 meq via ORAL
  Filled 2016-03-23 (×2): qty 1

## 2016-03-23 MED ORDER — LOSARTAN POTASSIUM 50 MG PO TABS
100.0000 mg | ORAL_TABLET | Freq: Two times a day (BID) | ORAL | Status: DC
  Administered 2016-03-23 – 2016-03-24 (×3): 100 mg via ORAL
  Filled 2016-03-23 (×3): qty 2

## 2016-03-23 NOTE — Progress Notes (Signed)
Received from ICU top bed 1m07 via wheelchair. Alert, oriented. B/P up on arrival to 60m07, but came down to parameters of less than 180sys.  Tele. Placed, pt is atrial paced.

## 2016-03-23 NOTE — Progress Notes (Signed)
Pt transferred to 5M07 via bed. Wife updated. All belongings sent with patient. Report to Manuela Schwartz RN to assume care of patient. Pt stable at time of discharge, no changes from morning assessment, VSS, pt alert, appropriate, verbal, oriented x4.

## 2016-03-23 NOTE — Progress Notes (Signed)
STROKE TEAM PROGRESS NOTE   SUBJECTIVE (INTERVAL HISTORY) No family is at the bedside.  Overall he feels his condition is stable. CT showed stable hematoma and CTA head and neck showed no AVM or aneurysm or significant carotid stenosis. His BP is under control, off cardene.    OBJECTIVE Temp:  [97.7 F (36.5 C)-98.6 F (37 C)] 97.9 F (36.6 C) (10/15 0041) Pulse Rate:  [59-88] 60 (10/15 0700) Resp:  [9-27] 18 (10/15 0700) BP: (129-173)/(70-98) 142/70 (10/15 0700) SpO2:  [92 %-100 %] 97 % (10/15 0700)  CBC:   Recent Labs Lab 03/21/16 2208  WBC 5.2  NEUTROABS 2.3  HGB 15.0  HCT 47.0  MCV 76.7*  PLT 123456    Basic Metabolic Panel:   Recent Labs Lab 03/22/16 0528 03/23/16 0352  NA 139 138  K 3.1* 3.4*  CL 107 109  CO2 24 22  GLUCOSE 149* 124*  BUN 11 10  CREATININE 1.08 0.92  CALCIUM 9.2 9.0    Lipid Panel:     Component Value Date/Time   CHOL 111 03/22/2016 0528   TRIG 43 03/22/2016 0528   HDL 34 (L) 03/22/2016 0528   CHOLHDL 3.3 03/22/2016 0528   VLDL 9 03/22/2016 0528   LDLCALC 68 03/22/2016 0528   HgbA1c:  Lab Results  Component Value Date   HGBA1C 7.0 (H) 04/27/2014   Urine Drug Screen:     Component Value Date/Time   LABOPIA NONE DETECTED 03/21/2016 2208   COCAINSCRNUR NONE DETECTED 03/21/2016 2208   LABBENZ NONE DETECTED 03/21/2016 2208   AMPHETMU NONE DETECTED 03/21/2016 2208   THCU NONE DETECTED 03/21/2016 2208   LABBARB NONE DETECTED 03/21/2016 2208      IMAGING I have personally reviewed the radiological images below and agree with the radiology interpretations.  Ct Head Wo Contrast 03/21/2016 1. Acute intraparenchymal hemorrhage centered at the right basal ganglia measuring 14 x 21 x 20 mm  (estimated volume 4 cc). Mild localized edema without significant mass effect. No intraventricular extension.  2. Probable additional punctate 6 mm parenchymal hemorrhage within the left lentiform nucleus, no associated edema. Underlying  hypertensive etiology is suspected.  3. Generalized age-related cerebral atrophy with moderate chronic microvascular ischemic disease.   Dg Chest Port 1 View 03/21/2016 No acute pulmonary process identified.   CTA head and neck  03/23/2016 1. Stable hemorrhage and right posterior basal ganglia and possible punctate hemorrhage within the left putamen. 2. No new acute intracranial abnormality identified. 3. Mild 20% stenosis of proximal internal carotid arteries bilaterally. 4. Plaque and tortuosity of proximal left vertebral artery and dense calcified plaque of right vertebral artery origins with probable at least moderate underlying stenosis. 5. No significant stenosis, proximal occlusion, aneurysm, or vascular malformation of the circle Willis. Specifically no abnormal vascularity in region of right posterior basal ganglia hemorrhage. 6. Intracranial atherosclerosis with dense calcification of carotid siphons and areas of mild stenosis.  TTE pending   PHYSICAL EXAM  Temp:  [97.7 F (36.5 C)-98.6 F (37 C)] 97.9 F (36.6 C) (10/15 0041) Pulse Rate:  [59-88] 60 (10/15 0700) Resp:  [9-27] 18 (10/15 0700) BP: (129-173)/(70-98) 142/70 (10/15 0700) SpO2:  [92 %-100 %] 97 % (10/15 0700)  General - Well nourished, well developed, in no apparent distress.  Ophthalmologic - Fundi not visualized due to small pupils.  Cardiovascular - Regular rate and rhythm.  Mental Status -  Level of arousal and orientation to time, place, and person were intact. Language including expression, naming, repetition, comprehension  was assessed and found intact. Fund of Knowledge was assessed and was intact.  Cranial Nerves II - XII - II - Visual field intact OU. III, IV, VI - Extraocular movements intact. V - Facial sensation intact bilaterally. VII - left nasolabial fold flattening. VIII - Hearing & vestibular intact bilaterally. X - Palate elevates symmetrically. XI - Chin turning & shoulder  shrug intact bilaterally. XII - Tongue protrusion intact.  Motor Strength - The patient's strength was normal in all extremities except left pronator drift and left iliopsoas 4+/5.  Bulk was normal and fasciculations were absent.   Motor Tone - Muscle tone was assessed at the neck and appendages and was normal.  Reflexes - The patient's reflexes were 1+ in all extremities and he had no pathological reflexes.  Sensory - Light touch, temperature/pinprick were assessed and were symmetrical.    Coordination - The patient had mild ataxia on the left FTN a little out of proportion of his left UE weakness.  Tremor was absent.  Gait and Station - deferred due to safety concerns.   ASSESSMENT/PLAN [name] is a 75 y.o. male with history of second-degree heart block, permanent pacemaker, obstructive sleep apnea, hypertension, previous stroke 2014, and carotid artery disease presenting with lower extremity numbness and elevated blood pressure. He did not receive IV t-PA due to [place].   Stroke:  Right BG small ICH and questionable left BG tiny ICH, secondary to hypertension.  Resultant  Mild left hemiparesis  MRI - not indicated - PPM  MRA - not indicated - PPM  CT -  Acute small ICH at the right BG and questionable left BG tiny ICH   Repeat CT stable hematoma  CTA H&N - no AVM or aneurysm or significant carotid stenosis, but athero at b/l siphon and proximal carotid bifurcation  2D Echo - pending  LDL - 68  HgbA1c - pending  VTE prophylaxis - SCDs Diet Heart Room service appropriate? Yes; Fluid consistency: Thin  aspirin 81 mg daily prior to admission, now on No antithrombotic secondary to hemorrhage   Ongoing aggressive stroke risk factor management  Therapy recommendations:  CIR - rehabilitation M.D. consult ordered  Disposition: Pending  Hypertension  Stable   BP goal < 160  Off cardene  Resume home medication  Labetalol PRN for SBP>180  Long-term BP  goal normotensive  Hyperlipidemia  Home meds:  Lipitor 80 mg daily prior to admission. Not resumed.  LDL 68, goal < 70  Continue statin at discharge  Other Stroke Risk Factors  Advanced age  Former smoker. Quit 2000.  Overweight, Body mass index is 28.59 kg/m., recommend weight loss, diet and exercise as appropriate   Hx stroke/TIA  Family hx stroke (mother)  Obstructive sleep apnea  Other Active Problems  hyperglycemia - await hemoglobin A1c  Hypokalemia - 3.1 (Lasix 40 mg daily) Supplement - recheck in AM ->3.4 -recheck in AM  Hospital day # 1  This patient is critically ill due to [place] with hypertensive emergency and at significant risk of neurological worsening, death form recurrent bleeding, heart failure, cerebral edema and herniation. This patient's care requires constant monitoring of vital signs, hemodynamics, respiratory and cardiac monitoring, review of multiple databases, neurological assessment, discussion with family, other specialists and medical decision making of high complexity. I spent 35 minutes of neurocritical care time in the care of this patient.  Rosalin Hawking, MD PhD Stroke Neurology 03/23/2016 10:25 AM    To contact Stroke Continuity provider, please refer to  [email or web address] After hours, contact General Neurology

## 2016-03-23 NOTE — Consult Note (Signed)
Physical Medicine and Rehabilitation Consult Reason for Consult:weakness after stroke Referring Phsyician: Richard Alvarado is an 75 y.o. male.   HPI: Attending high school football game on 03/21/2016 and noted bilateral lower extremity weakness. EMS was called and initial blood pressure to 280/180. Was taken to the emergency department at [facility] treated with nitroglycerin as well as labetalol with little response, placed on nicardipine drip, which helped reduce blood pressure. Transported to [facility] after CT of the head showed a small acute intracranial hemorrhage in the right basal ganglia. Additional punctate parenchymal hemorrhage noted in the left lentiform nucleus. CT angiogram so that no significant carotid stenosis. No evidence of AVM or aneurysm. Stable hematoma. Cardene weaned, blood pressure stabilized Seen by the stroke team. PT started, left lateral lean noted. Had bilateral lower extremity give way weakness while Standing, mod assist 16 feet with PT, was total assist after 14 feet Patient denies any back pain. No history of leg weakness prior to stroke He feels like he is getting stronger. His wife is a retired Marine scientist and can assist him at home. He also has a grandson who is an EMT, who also live close by. Review of Systems - General ROS: positive for  - fatigue  Negative for chest pain, shortness of breath Negative for nausea, vomiting, diarrhea, constipation Negative for joint pains or joint range of motion, restrictions Positive for numbness on the left side. Positive for clumsiness on the left side Negative for bladder incontinence or burning with urination Negative for visual issues. No problems with swallowing No problems with cool limbs or swelling in the limbs No problems with sweats or chills No skin rashes or itching Past Medical History:  Diagnosis Date  . Cerebral atherosclerosis    CAROTID DOPPLER, 09/07/2009 - RIGHT AND LEFT  CCAs-small-moderate amount of irregular mixed density plaque, no significant evidence of diameter reduction; RIGHT AND LEFT ICAs-moderate amount irregular mixed density plaque, no evidence of significant diameter reduction  . CVA (cerebral vascular accident) ([place]) 1994   Left brain  . History of CVA (cerebrovascular accident) 04/24/2013  . Hypertension    RENAL DOPPLER, 03/08/2009 - normal  . Hypertension in pregnancy, preeclampsia, severe 04/24/2013  . Obstructive sleep apnea 09/26/2005   AHI-19.46, during REM-36.88  . Pacemaker [employer] Jude victory, dual chamber, 2006 04/24/2013  . Pericardial effusion    2D ECHO, 03/07/2011 - EF >70%, normal  . Second degree heart block 04/24/2013   Past Surgical History:  Procedure Laterality Date  . EP IMPLANTABLE DEVICE N/A 11/21/2014   Procedure: PPM/BIV PPM Generator Changeout;  Surgeon: [name], MD;  Location: [place] [facility];  Service: Cardiovascular;  Laterality: N/A;  . NM MYOVIEW LTD  03/31/2012   Normal study, no ECG changes, EKG negative for ischemia, post-stress EF 54%  . PACEMAKER INSERTION  05/09/2005   St. Jude [place], [place] #[id number], serial #[id number]   Family History  Problem Relation Age of Onset  . CVA Mother   . Hypertension Sister   . Heart disease Maternal Grandmother    Social History:  reports that he quit smoking about 16 years ago. He has never used smokeless tobacco. He reports that he does not drink alcohol or use drugs. Allergies: No Known Allergies Medications Prior to Admission  Medication Sig Dispense Refill  . amLODipine (NORVASC) 5 MG tablet Take 7.5 mg by mouth daily.    Marland Kitchen aspirin EC 81 MG tablet Take 81 mg by mouth at bedtime.    Marland Kitchen  atorvastatin (LIPITOR) 80 MG tablet Take 80 mg by mouth at bedtime.    . cloNIDine (CATAPRES) 0.2 MG tablet Take 0.2 mg by mouth 2 (two) times daily.    . furosemide (LASIX) 40 MG tablet Take 40 mg by mouth daily.    Marland Kitchen glipiZIDE-metformin (METAGLIP) 5-500 MG per tablet Take 2  tablets by mouth 2 (two) times daily.  11  . KLOR-CON 10 10 MEQ tablet TAKE 1 TABLET BY MOUTH 3 TIMES A DAY 270 tablet 2  . losartan (COZAAR) 100 MG tablet Take 200 mg by mouth daily.     . nebivolol (BYSTOLIC) 10 MG tablet Take 10 mg by mouth daily.    . niacin (NIASPAN) 500 MG CR tablet Take 500 mg by mouth at bedtime.      Home: Home Living Family/patient expects to be discharged to:: Private residence Living Arrangements: Spouse/significant other Available Help at Discharge: Family Type of Home: House  Functional History: Prior Function Comments: was at football game prior to admission Functional Status:  Mobility:     Ambulation/Gait Ambulation Distance (Feet): 16 Feet Gait velocity: decreased General Gait Details: patient with left lateral lean noted during ambulation attempt, upon reaching ~16 feet, patient LEs gave way completely and patient propped upon therapist knee and wall while nurses acquired chair and provided physical assist to elevate patient (+3) off of therapist knee into chair, then patient transfered from chair into recliner with moderate assist of 2 persons (inattention to Left side, multi cues for awarenes of objects)    ADL:    Cognition: Cognition Orientation Level: Oriented X4    Blood pressure (!) 165/88, pulse 62, temperature 97.9 F (36.6 C), temperature source Oral, resp. rate 15, height 5' 9.5" (1.765 m), weight 89.1 kg (196 lb 6.9 oz), SpO2 100 %. Physical Exam    General: No acute distress Mood and affect are appropriate Heart: Regular rate and rhythm no rubs murmurs or extra sounds Lungs: Clear to auscultation, breathing unlabored, no rales or wheezes Abdomen: Positive bowel sounds, soft nontender to palpation, nondistended Extremities: No clubbing, cyanosis, or edema Skin: No evidence of breakdown, no evidence of rash Neurologic: Cranial nerves II through XII intact, motor strength is 5/5 in R deltoid, bicep, tricep, grip, hip flexor,  knee extensors, ankle dorsiflexor and plantar flexor 4+ in same muscle groups on Left side Sensory exam normal sensation to light touch and proprioception in bilateral upper and lower extremities, patient does note there is a difference in light touch sensation between the left hand and the right hand Cerebellar exam normal finger to nose to finger as well as heel to shin in right upper and lower extremities Mild dysmetria in left finger-nose-finger and left heel-to-shin Musculoskeletal: Full range of motion in all 4 extremities. No joint swelling  Results for orders placed or performed during the hospital encounter of 03/22/16 (from the past 24 hour(s))  Glucose, capillary     Status: Abnormal   Collection Time: 03/22/16 12:33 PM  Result Value Ref Range   Glucose-Capillary 169 (H) 65 - 99 mg/dL  Glucose, capillary     Status: Abnormal   Collection Time: 03/22/16  4:04 PM  Result Value Ref Range   Glucose-Capillary 219 (H) 65 - 99 mg/dL  Glucose, capillary     Status: Abnormal   Collection Time: 03/22/16  9:18 PM  Result Value Ref Range   Glucose-Capillary 157 (H) 65 - 99 mg/dL  Basic metabolic panel     Status: Abnormal   Collection  Time: 03/23/16  3:52 AM  Result Value Ref Range   Sodium 138 135 - 145 mmol/L   Potassium 3.4 (L) 3.5 - 5.1 mmol/L   Chloride 109 101 - 111 mmol/L   CO2 22 22 - 32 mmol/L   Glucose, Bld 124 (H) 65 - 99 mg/dL   BUN 10 6 - 20 mg/dL   Creatinine, Ser 0.92 0.61 - 1.24 mg/dL   Calcium 9.0 8.9 - 10.3 mg/dL   GFR calc non Af Amer >60 >60 mL/min   GFR calc Af Amer >60 >60 mL/min   Anion gap 7 5 - 15  Glucose, capillary     Status: Abnormal   Collection Time: 03/23/16  8:05 AM  Result Value Ref Range   Glucose-Capillary 125 (H) 65 - 99 mg/dL   Ct Angio Head W Or Wo Contrast  Result Date: 03/23/2016 CLINICAL DATA:  75 y/o  M; follow-up of intracranial hemorrhage. EXAM: CT ANGIOGRAPHY HEAD AND NECK TECHNIQUE: Multidetector CT imaging of the head and neck  was performed using the standard protocol during bolus administration of intravenous contrast. Multiplanar CT image reconstructions and MIPs were obtained to evaluate the vascular anatomy. Carotid stenosis measurements (when applicable) are obtained utilizing NASCET criteria, using the distal internal carotid diameter as the denominator. CONTRAST:  50 cc Isovue 370. COMPARISON:  03/21/2016 CT head. FINDINGS: CT HEAD FINDINGS Brain: Stable acute hemorrhage within the right posterior basal ganglia. Stable punctate density in left putaminal possibly representing an additional hemorrhage. Stable background of advanced chronic microvascular ischemic changes and mild parenchymal volume loss for age. No evidence for new intracranial hemorrhage, large territory infarct, or focal mass effect. No extra-axial collection. Stable ventricle size. Vascular: As below. Skull: Normal. Negative for fracture or focal lesion. Sinuses: Patchy opacification of ethmoid sinuses and small mucous retention cysts within the alveolar recesses of maxillary sinuses. Otherwise negative. Orbits: Negative Review of the MIP images confirms the above findings CTA NECK FINDINGS Aortic arch: Standard branching. Imaged portion shows no evidence of aneurysm or dissection. No significant stenosis of the major arch vessel origins. Aortic atherosclerosis with mild calcification. Right carotid system: No evidence of dissection, stenosis (50% or greater) or occlusion. Mild mixed plaque of common carotid artery and extensive calcified plaque carotid bifurcation and proximal internal carotid artery. Mild proximal ICA stenosis, 20%. Left carotid system: No evidence of dissection, stenosis (50% or greater) or occlusion. Mild mixed plaque of common carotid artery and extensive calcified plaque carotid bifurcation and proximal internal carotid artery. Mild proximal ICA stenosis, 20%. Vertebral arteries: Tortuosity and atherosclerosis of left proximal vertebral  artery origin with moderate to severe stenosis. Calcified plaque of right vertebral artery origin, accurate assessment of luminal stenosis is precluded by dense calcification. Vertebral arteries are otherwise widely patent. Skeleton: No acute osseous abnormality. Severe cervical spondylosis with discogenic and facet arthropathy. Moderate to severe multilevel canal stenosis greatest at the C3-4 level where there is probable cord impingement. Multilevel moderate to severe bony foraminal narrowing greater on the right from C3 through C5. Other neck: 10 mm right thyroid lobe nodule. No mass or inflammatory process of the neck identified. Upper chest: Clear. Review of the MIP images confirms the above findings CTA HEAD FINDINGS Anterior circulation: No significant stenosis, proximal occlusion, aneurysm, or vascular malformation. Dense calcific atherosclerosis carotid siphons with mild underlying stenosis. Triangular 2 mm anteriorly directed outpouching of carotid terminus likely represents a tiny vessel infundibulum (series 502, image 257). Posterior circulation: No significant stenosis, proximal occlusion, aneurysm, or vascular  malformation. Venous sinuses: As permitted by contrast timing, patent. Anatomic variants: Bilateral fetal posterior cerebral arteries. No anterior communicating artery identified, likely hypoplastic or absent. Delayed phase: No abnormal enhancement. Review of the MIP images confirms the above findings IMPRESSION: 1. Stable hemorrhage and right posterior basal ganglia and possible punctate hemorrhage within the left putamen. 2. No new acute intracranial abnormality identified. 3. Mild 20% stenosis of proximal internal carotid arteries bilaterally. 4. Plaque and tortuosity of proximal left vertebral artery and dense calcified plaque of right vertebral artery origins with probable at least moderate underlying stenosis. 5. No significant stenosis, proximal occlusion, aneurysm, or vascular  malformation of the circle Willis. Specifically no abnormal vascularity in region of right posterior basal ganglia hemorrhage. 6. Intracranial atherosclerosis with dense calcification of carotid siphons and areas of mild stenosis. Electronically Signed   By: Kristine Garbe M.D.   On: 03/23/2016 02:49   Ct Head Wo Contrast  Result Date: 03/21/2016 CLINICAL DATA:  Initial evaluation for acute weakness, ataxic gait. EXAM: CT HEAD WITHOUT CONTRAST TECHNIQUE: Contiguous axial images were obtained from the base of the skull through the vertex without intravenous contrast. COMPARISON:  Prior CT from 05/03/2005. FINDINGS: Brain: Generalized age-related cerebral atrophy. Moderately advanced chronic microvascular ischemic disease. There is an acute intraparenchymal hemorrhage centered at the right basal ganglia that measures 14 x 21 x 28 mm (estimated volume 4 cc). Mild localized vasogenic edema without significant mass effect. Hemorrhages centered at the posterior limb of the right internal capsule. No intraventricular extension. No midline shift. Punctate 6 mm hyperdensity within the left lentiform nucleus also noted, also suspicious for a possible tiny parenchymal hemorrhage (series 2, image 12). No other acute large vessel territory infarct. No mass lesion, midline shift or or mass effect. No hydrocephalus. No extra-axial fluid collection. Vascular: No hyperdense vessel. Prominent vascular calcifications present within the carotid siphons. Skull: Scalp soft tissues within normal limits.  Calvarium intact. Sinuses/Orbits: No acute abnormality about the globes in orbits. Remote defect noted at the left lamina papyracea. Scattered mucosal thickening within the ethmoidal air cells and partially visualized maxillary sinuses. No mastoid effusion. IMPRESSION: 1. Acute intraparenchymal hemorrhage centered at the right basal ganglia measuring 14 x 21 x 20 mm (estimated volume 4 cc). Mild localized edema without  significant mass effect. No intraventricular extension. 2. Probable additional punctate 6 mm parenchymal hemorrhage within the left lentiform nucleus, no associated edema. Underlying hypertensive etiology is suspected. 3. Generalized age-related cerebral atrophy with moderate chronic microvascular ischemic disease. Critical Value/emergent results were called by telephone at the time of interpretation on 03/21/2016 at 10:52 pm to Dr. Charlotte Crumb , who verbally acknowledged these results. Electronically Signed   By: Jeannine Boga M.D.   On: 03/21/2016 23:02   Ct Angio Neck W Or Wo Contrast  Result Date: 03/23/2016 CLINICAL DATA:  75 y/o  M; follow-up of intracranial hemorrhage. EXAM: CT ANGIOGRAPHY HEAD AND NECK TECHNIQUE: Multidetector CT imaging of the head and neck was performed using the standard protocol during bolus administration of intravenous contrast. Multiplanar CT image reconstructions and MIPs were obtained to evaluate the vascular anatomy. Carotid stenosis measurements (when applicable) are obtained utilizing NASCET criteria, using the distal internal carotid diameter as the denominator. CONTRAST:  50 cc Isovue 370. COMPARISON:  03/21/2016 CT head. FINDINGS: CT HEAD FINDINGS Brain: Stable acute hemorrhage within the right posterior basal ganglia. Stable punctate density in left putaminal possibly representing an additional hemorrhage. Stable background of advanced chronic microvascular ischemic changes and mild parenchymal volume  loss for age. No evidence for new intracranial hemorrhage, large territory infarct, or focal mass effect. No extra-axial collection. Stable ventricle size. Vascular: As below. Skull: Normal. Negative for fracture or focal lesion. Sinuses: Patchy opacification of ethmoid sinuses and small mucous retention cysts within the alveolar recesses of maxillary sinuses. Otherwise negative. Orbits: Negative Review of the MIP images confirms the above findings CTA NECK  FINDINGS Aortic arch: Standard branching. Imaged portion shows no evidence of aneurysm or dissection. No significant stenosis of the major arch vessel origins. Aortic atherosclerosis with mild calcification. Right carotid system: No evidence of dissection, stenosis (50% or greater) or occlusion. Mild mixed plaque of common carotid artery and extensive calcified plaque carotid bifurcation and proximal internal carotid artery. Mild proximal ICA stenosis, 20%. Left carotid system: No evidence of dissection, stenosis (50% or greater) or occlusion. Mild mixed plaque of common carotid artery and extensive calcified plaque carotid bifurcation and proximal internal carotid artery. Mild proximal ICA stenosis, 20%. Vertebral arteries: Tortuosity and atherosclerosis of left proximal vertebral artery origin with moderate to severe stenosis. Calcified plaque of right vertebral artery origin, accurate assessment of luminal stenosis is precluded by dense calcification. Vertebral arteries are otherwise widely patent. Skeleton: No acute osseous abnormality. Severe cervical spondylosis with discogenic and facet arthropathy. Moderate to severe multilevel canal stenosis greatest at the C3-4 level where there is probable cord impingement. Multilevel moderate to severe bony foraminal narrowing greater on the right from C3 through C5. Other neck: 10 mm right thyroid lobe nodule. No mass or inflammatory process of the neck identified. Upper chest: Clear. Review of the MIP images confirms the above findings CTA HEAD FINDINGS Anterior circulation: No significant stenosis, proximal occlusion, aneurysm, or vascular malformation. Dense calcific atherosclerosis carotid siphons with mild underlying stenosis. Triangular 2 mm anteriorly directed outpouching of carotid terminus likely represents a tiny vessel infundibulum (series 502, image 257). Posterior circulation: No significant stenosis, proximal occlusion, aneurysm, or vascular malformation.  Venous sinuses: As permitted by contrast timing, patent. Anatomic variants: Bilateral fetal posterior cerebral arteries. No anterior communicating artery identified, likely hypoplastic or absent. Delayed phase: No abnormal enhancement. Review of the MIP images confirms the above findings IMPRESSION: 1. Stable hemorrhage and right posterior basal ganglia and possible punctate hemorrhage within the left putamen. 2. No new acute intracranial abnormality identified. 3. Mild 20% stenosis of proximal internal carotid arteries bilaterally. 4. Plaque and tortuosity of proximal left vertebral artery and dense calcified plaque of right vertebral artery origins with probable at least moderate underlying stenosis. 5. No significant stenosis, proximal occlusion, aneurysm, or vascular malformation of the circle Willis. Specifically no abnormal vascularity in region of right posterior basal ganglia hemorrhage. 6. Intracranial atherosclerosis with dense calcification of carotid siphons and areas of mild stenosis. Electronically Signed   By: Kristine Garbe M.D.   On: 03/23/2016 02:49   Dg Chest Port 1 View  Result Date: 03/21/2016 CLINICAL DATA:  75 y/o M; sudden onset of weakness mostly in the legs with severe hypertension. EXAM: PORTABLE CHEST 1 VIEW COMPARISON:  12/12/2010 chest radiating FINDINGS: Stable cardiac silhouette within normal limits. Aortic atherosclerosis with arch calcifications. Two lead pacemaker with pacing unit changed from prior study. Clear lungs. No pneumothorax or pleural effusion. Rightward curvature and degenerative changes of thoracic spine. IMPRESSION: No acute pulmonary process identified. Electronically Signed   By: Kristine Garbe M.D.   On: 03/21/2016 22:31    Assessment/Plan: Diagnosis: Mild left hemiparesis and ataxia after small right basal ganglia intracranial hemorrhage due to hypertension  1. Does the need for close, 24 hr/day medical supervision in concert with the  patient's rehab needs make it unreasonable for this patient to be served in a less intensive setting? Potentially 2. Co-Morbidities requiring supervision/potential complications: Hypertension 3. Due to bladder management, bowel management, safety, skin/wound care, disease management, medication administration, pain management and patient education, does the patient require 24 hr/day rehab nursing? Potentially 4. Does the patient require coordinated care of a physician, rehab nurse, PT (1-2 hrs/day, 5 days/week) and OT (1-2 hrs/day, 5 days/week) to address physical and functional deficits in the context of the above medical diagnosis(es)? Potentially Addressing deficits in the following areas: balance, endurance, locomotion, strength, transferring, bowel/bladder control, bathing, dressing and toileting 5. Can the patient actively participate in an intensive therapy program of at least 3 hrs of therapy per day at least 5 days per week? Yes 6. The potential for patient to make measurable gains while on inpatient rehab is excellent 7. Anticipated functional outcomes upon discharge from inpatients are Mod I/Sup PT, Mod I/Sup OT, NA SLP 8. Estimated rehab length of stay to reach the above functional goals is: 5-7d 9. Does the patient have adequate social supports to accommodate these discharge functional goals? Yes 10. Anticipated D/C setting: Home 11. Anticipated post D/C treatments: [facility] therapy 12. Overall Rehab/Functional Prognosis: excellent  RECOMMENDATIONS: This patient's condition is appropriate for continued rehabilitative care in the following setting: If patient continues to require physical assistance by hospital day 3-4. Would recommend CIR Patient has agreed to participate in recommended program. Yes Note that insurance prior authorization may be required for reimbursement for recommended care.  Comment:  Patient making rapid improvement, may progress to a supervision modified level in the  next day or 2, we will need PT/OT follow-up as well as rehabilitation admission Coordinator follow-up   Charlett Blake 03/23/2016

## 2016-03-23 NOTE — Progress Notes (Signed)
Attempted to call report to [id number] RN assuming care of patient will return call.

## 2016-03-24 ENCOUNTER — Inpatient Hospital Stay (HOSPITAL_COMMUNITY)
Admission: RE | Admit: 2016-03-24 | Discharge: 2016-04-03 | DRG: 093 | Disposition: A | Discharge status: Home / Self Care | Payer: Medicare Other | Source: Intra-hospital | Attending: Physical Medicine & Rehabilitation | Admitting: Physical Medicine & Rehabilitation

## 2016-03-24 DIAGNOSIS — Z8249 Family history of ischemic heart disease and other diseases of the circulatory system: Secondary | ICD-10-CM | POA: Diagnosis not present

## 2016-03-24 DIAGNOSIS — I69193 Ataxia following nontraumatic intracerebral hemorrhage: Secondary | ICD-10-CM | POA: Diagnosis not present

## 2016-03-24 DIAGNOSIS — I672 Cerebral atherosclerosis: Secondary | ICD-10-CM | POA: Diagnosis present

## 2016-03-24 DIAGNOSIS — Z8673 Personal history of transient ischemic attack (TIA), and cerebral infarction without residual deficits: Secondary | ICD-10-CM | POA: Diagnosis not present

## 2016-03-24 DIAGNOSIS — R2689 Other abnormalities of gait and mobility: Secondary | ICD-10-CM | POA: Diagnosis present

## 2016-03-24 DIAGNOSIS — E1149 Type 2 diabetes mellitus with other diabetic neurological complication: Secondary | ICD-10-CM | POA: Diagnosis present

## 2016-03-24 DIAGNOSIS — E785 Hyperlipidemia, unspecified: Secondary | ICD-10-CM | POA: Diagnosis present

## 2016-03-24 DIAGNOSIS — Z7982 Long term (current) use of aspirin: Secondary | ICD-10-CM | POA: Diagnosis not present

## 2016-03-24 DIAGNOSIS — G4733 Obstructive sleep apnea (adult) (pediatric): Secondary | ICD-10-CM | POA: Diagnosis present

## 2016-03-24 DIAGNOSIS — I10 Essential (primary) hypertension: Secondary | ICD-10-CM | POA: Diagnosis present

## 2016-03-24 DIAGNOSIS — Z79899 Other long term (current) drug therapy: Secondary | ICD-10-CM

## 2016-03-24 DIAGNOSIS — E11649 Type 2 diabetes mellitus with hypoglycemia without coma: Secondary | ICD-10-CM | POA: Diagnosis not present

## 2016-03-24 DIAGNOSIS — Z823 Family history of stroke: Secondary | ICD-10-CM

## 2016-03-24 DIAGNOSIS — R29810 Facial weakness: Secondary | ICD-10-CM | POA: Diagnosis present

## 2016-03-24 DIAGNOSIS — R270 Ataxia, unspecified: Secondary | ICD-10-CM

## 2016-03-24 DIAGNOSIS — Z87891 Personal history of nicotine dependence: Secondary | ICD-10-CM

## 2016-03-24 DIAGNOSIS — Z950 Presence of cardiac pacemaker: Secondary | ICD-10-CM | POA: Diagnosis not present

## 2016-03-24 DIAGNOSIS — I619 Nontraumatic intracerebral hemorrhage, unspecified: Secondary | ICD-10-CM | POA: Diagnosis not present

## 2016-03-24 DIAGNOSIS — E876 Hypokalemia: Secondary | ICD-10-CM

## 2016-03-24 LAB — CBC
HCT: 41 % (ref 39.0–52.0)
Hemoglobin: 12.9 g/dL — ABNORMAL LOW (ref 13.0–17.0)
MCH: 24 pg — ABNORMAL LOW (ref 26.0–34.0)
MCHC: 31.5 g/dL (ref 30.0–36.0)
MCV: 76.2 fL — ABNORMAL LOW (ref 78.0–100.0)
Platelets: 235 10*3/uL (ref 150–400)
RBC: 5.38 MIL/uL (ref 4.22–5.81)
RDW: 14.2 % (ref 11.5–15.5)
WBC: 5.1 10*3/uL (ref 4.0–10.5)

## 2016-03-24 LAB — BASIC METABOLIC PANEL
Anion gap: 8 (ref 5–15)
BUN: 12 mg/dL (ref 6–20)
CO2: 24 mmol/L (ref 22–32)
Calcium: 9.2 mg/dL (ref 8.9–10.3)
Chloride: 105 mmol/L (ref 101–111)
Creatinine, Ser: 1.26 mg/dL — ABNORMAL HIGH (ref 0.61–1.24)
GFR calc Af Amer: >60 mL/min (ref >60)
GFR calc non Af Amer: 54 mL/min — ABNORMAL LOW (ref >60)
Glucose, Bld: 126 mg/dL — ABNORMAL HIGH (ref 65–99)
Potassium: 3.5 mmol/L (ref 3.5–5.1)
Sodium: 137 mmol/L (ref 135–145)

## 2016-03-24 LAB — GLUCOSE, CAPILLARY: Glucose-Capillary: 115 mg/dL — ABNORMAL HIGH (ref 65–99)

## 2016-03-24 MED ORDER — GLIPIZIDE-METFORMIN HCL 5-500 MG PO TABS: 2.0000 | ORAL_TABLET | Freq: Two times a day (BID) | ORAL | Status: DC

## 2016-03-24 MED ORDER — METFORMIN HCL 500 MG PO TABS
1000.0000 mg | ORAL_TABLET | Freq: Two times a day (BID) | ORAL | Status: DC
  Administered 2016-03-24 – 2016-03-30 (×12): 1000 mg via ORAL
  Filled 2016-03-24 (×12): qty 2

## 2016-03-24 MED ORDER — CLONIDINE HCL 0.1 MG PO TABS
0.1000 mg | ORAL_TABLET | Freq: Once | ORAL | Status: AC
  Administered 2016-03-24: 0.1 mg via ORAL
  Filled 2016-03-24: qty 1

## 2016-03-24 MED ORDER — GLIPIZIDE 10 MG PO TABS
10.0000 mg | ORAL_TABLET | Freq: Two times a day (BID) | ORAL | Status: DC
  Administered 2016-03-24 – 2016-03-30 (×12): 10 mg via ORAL
  Filled 2016-03-24: qty 2
  Filled 2016-03-24: qty 1
  Filled 2016-03-24: qty 2
  Filled 2016-03-24: qty 1
  Filled 2016-03-24 (×3): qty 2
  Filled 2016-03-24 (×3): qty 1
  Filled 2016-03-24 (×2): qty 2

## 2016-03-24 MED ORDER — NEBIVOLOL HCL 10 MG PO TABS
10.0000 mg | ORAL_TABLET | Freq: Every day | ORAL | Status: DC
  Administered 2016-03-25 – 2016-04-03 (×10): 10 mg via ORAL
  Filled 2016-03-24 (×10): qty 1

## 2016-03-24 MED ORDER — GLIPIZIDE 5 MG PO TABS: 10.0000 mg | ORAL_TABLET | Freq: Two times a day (BID) | ORAL | Status: DC

## 2016-03-24 MED ORDER — METFORMIN HCL 500 MG PO TABS: 1000.0000 mg | ORAL_TABLET | Freq: Two times a day (BID) | ORAL | Status: DC

## 2016-03-24 MED ORDER — INSULIN ASPART 100 UNIT/ML ~~LOC~~ SOLN: 0.0000 [IU] | Freq: Every day | SUBCUTANEOUS | Status: DC

## 2016-03-24 MED ORDER — SENNOSIDES-DOCUSATE SODIUM 8.6-50 MG PO TABS
1.0000 | ORAL_TABLET | Freq: Two times a day (BID) | ORAL | Status: DC
  Administered 2016-03-24 – 2016-04-02 (×14): 1 via ORAL
  Filled 2016-03-24 (×18): qty 1

## 2016-03-24 MED ORDER — CLONIDINE HCL 0.2 MG PO TABS
0.2000 mg | ORAL_TABLET | Freq: Two times a day (BID) | ORAL | Status: DC
  Administered 2016-03-24 – 2016-03-26 (×4): 0.2 mg via ORAL
  Filled 2016-03-24 (×4): qty 1

## 2016-03-24 MED ORDER — BISACODYL 10 MG RE SUPP: 10.0000 mg | Freq: Every day | RECTAL | Status: DC | PRN

## 2016-03-24 MED ORDER — LOSARTAN POTASSIUM 50 MG PO TABS
100.0000 mg | ORAL_TABLET | Freq: Two times a day (BID) | ORAL | Status: DC
  Administered 2016-03-24 – 2016-03-26 (×4): 100 mg via ORAL
  Filled 2016-03-24 (×4): qty 2

## 2016-03-24 MED ORDER — AMLODIPINE BESYLATE 10 MG PO TABS
10.0000 mg | ORAL_TABLET | Freq: Every day | ORAL | Status: DC
  Administered 2016-03-25 – 2016-04-03 (×10): 10 mg via ORAL
  Filled 2016-03-24 (×10): qty 1

## 2016-03-24 MED ORDER — PROCHLORPERAZINE 25 MG RE SUPP: 12.5000 mg | Freq: Four times a day (QID) | RECTAL | Status: DC | PRN

## 2016-03-24 MED ORDER — PROCHLORPERAZINE EDISYLATE 5 MG/ML IJ SOLN: 5.0000 mg | Freq: Four times a day (QID) | INTRAMUSCULAR | Status: DC | PRN

## 2016-03-24 MED ORDER — ACETAMINOPHEN 325 MG PO TABS: 325.0000 mg | ORAL_TABLET | ORAL | Status: DC | PRN

## 2016-03-24 MED ORDER — ATORVASTATIN CALCIUM 80 MG PO TABS
80.0000 mg | ORAL_TABLET | Freq: Every day | ORAL | Status: DC
Start: 2016-03-24 — End: 2016-04-03
  Administered 2016-03-24 – 2016-04-02 (×10): 80 mg via ORAL
  Filled 2016-03-24 (×11): qty 1

## 2016-03-24 MED ORDER — FUROSEMIDE 40 MG PO TABS
40.0000 mg | ORAL_TABLET | Freq: Every day | ORAL | Status: DC
  Administered 2016-03-25 – 2016-04-03 (×10): 40 mg via ORAL
  Filled 2016-03-24 (×10): qty 1

## 2016-03-24 MED ORDER — DIPHENHYDRAMINE HCL 12.5 MG/5ML PO ELIX: 12.5000 mg | ORAL_SOLUTION | Freq: Four times a day (QID) | ORAL | Status: DC | PRN

## 2016-03-24 MED ORDER — INSULIN ASPART 100 UNIT/ML ~~LOC~~ SOLN
0.0000 [IU] | Freq: Three times a day (TID) | SUBCUTANEOUS | Status: DC
  Administered 2016-03-25: 1 [IU] via SUBCUTANEOUS
  Administered 2016-03-26: 2 [IU] via SUBCUTANEOUS
  Administered 2016-03-27: 1 [IU] via SUBCUTANEOUS
  Administered 2016-03-30 (×2): 2 [IU] via SUBCUTANEOUS
  Administered 2016-03-31: 1 [IU] via SUBCUTANEOUS

## 2016-03-24 MED ORDER — ATORVASTATIN CALCIUM 80 MG PO TABS: 80.0000 mg | ORAL_TABLET | Freq: Every day | ORAL | Status: DC

## 2016-03-24 MED ORDER — PANTOPRAZOLE SODIUM 40 MG PO TBEC
40.0000 mg | DELAYED_RELEASE_TABLET | Freq: Every day | ORAL | Status: DC
  Administered 2016-03-25 – 2016-04-03 (×10): 40 mg via ORAL
  Filled 2016-03-24 (×10): qty 1

## 2016-03-24 MED ORDER — FLEET ENEMA 7-19 GM/118ML RE ENEM: 1.0000 | ENEMA | Freq: Once | RECTAL | Status: DC | PRN

## 2016-03-24 MED ORDER — TRAZODONE HCL 50 MG PO TABS: 25.0000 mg | ORAL_TABLET | Freq: Every evening | ORAL | Status: DC | PRN

## 2016-03-24 MED ORDER — GUAIFENESIN-DM 100-10 MG/5ML PO SYRP: 5.0000 mL | ORAL_SOLUTION | Freq: Four times a day (QID) | ORAL | Status: DC | PRN

## 2016-03-24 MED ORDER — POTASSIUM CHLORIDE CRYS ER 20 MEQ PO TBCR
20.0000 meq | EXTENDED_RELEASE_TABLET | Freq: Two times a day (BID) | ORAL | Status: AC
  Administered 2016-03-24: 20 meq via ORAL
  Filled 2016-03-24 (×2): qty 1

## 2016-03-24 MED ORDER — ALUM & MAG HYDROXIDE-SIMETH 200-200-20 MG/5ML PO SUSP: 30.0000 mL | ORAL | Status: DC | PRN

## 2016-03-24 MED ORDER — PROCHLORPERAZINE MALEATE 5 MG PO TABS
5.0000 mg | ORAL_TABLET | Freq: Four times a day (QID) | ORAL | Status: DC | PRN
  Administered 2016-03-30: 10 mg via ORAL
  Filled 2016-03-24: qty 2

## 2016-03-24 NOTE — Progress Notes (Signed)
Richard Blake, MD Physician Signed Physical Medicine and Rehabilitation  Consult Note Date of Service: 03/23/2016 11:05 AM  Related encounter: Admission (Discharged) from 03/22/2016 in [place] All Collapse All   [] Hide copied text [] Hover for attribution information Physical Medicine and Rehabilitation Consult Reason for Consult:weakness after stroke Referring Phsyician: Richard Alvarado is an 75 y.o. male.   HPI: Attending high school football game on 03/21/2016 and noted bilateral lower extremity weakness. EMS was called and initial blood pressure to 280/180. Was taken to the emergency department at [facility] treated with nitroglycerin as well as labetalol with little response, placed on nicardipine drip, which helped reduce blood pressure. Transported to [facility] after CT of the head showed a small acute intracranial hemorrhage in the right basal ganglia. Additional punctate parenchymal hemorrhage noted in the left lentiform nucleus. CT angiogram so that no significant carotid stenosis. No evidence of AVM or aneurysm. Stable hematoma. Cardene weaned, blood pressure stabilized Seen by the stroke team. PT started, left lateral lean noted. Had bilateral lower extremity give way weakness while Standing, mod assist 16 feet with PT, was total assist after 14 feet Patient denies any back pain. No history of leg weakness prior to stroke He feels like he is getting stronger. His wife is a retired Marine scientist and can assist him at home. He also has a grandson who is an EMT, who also live close by. Review of Systems - General ROS: positive for  - fatigue  Negative for chest pain, shortness of breath Negative for nausea, vomiting, diarrhea, constipation Negative for joint pains or joint range of motion, restrictions Positive for numbness on the left side. Positive for clumsiness on the left side Negative for bladder  incontinence or burning with urination Negative for visual issues. No problems with swallowing No problems with cool limbs or swelling in the limbs No problems with sweats or chills No skin rashes or itching     Past Medical History:  Diagnosis Date  . Cerebral atherosclerosis    CAROTID DOPPLER, 09/07/2009 - RIGHT AND LEFT CCAs-small-moderate amount of irregular mixed density plaque, no significant evidence of diameter reduction; RIGHT AND LEFT ICAs-moderate amount irregular mixed density plaque, no evidence of significant diameter reduction  . CVA (cerebral vascular accident) ([place]) 1994   Left brain  . History of CVA (cerebrovascular accident) 04/24/2013  . Hypertension    RENAL DOPPLER, 03/08/2009 - normal  . Hypertension in pregnancy, preeclampsia, severe 04/24/2013  . Obstructive sleep apnea 09/26/2005   AHI-19.46, during REM-36.88  . Pacemaker [employer] Jude victory, dual chamber, 2006 04/24/2013  . Pericardial effusion    2D ECHO, 03/07/2011 - EF >70%, normal  . Second degree heart block 04/24/2013        Past Surgical History:  Procedure Laterality Date  . EP IMPLANTABLE DEVICE N/A 11/21/2014   Procedure: PPM/BIV PPM Generator Changeout;  Surgeon: [name], MD;  Location: [place] [facility];  Service: Cardiovascular;  Laterality: N/A;  . NM MYOVIEW LTD  03/31/2012   Normal study, no ECG changes, EKG negative for ischemia, post-stress EF 54%  . PACEMAKER INSERTION  05/09/2005   St. Jude [place], [place] #[id number], serial #[id number]        Family History  Problem Relation Age of Onset  . CVA Mother   . Hypertension Sister   . Heart disease Maternal Grandmother    Social History:  reports that he quit smoking  about 16 years ago. He has never used smokeless tobacco. He reports that he does not drink alcohol or use drugs. Allergies: No Known Allergies       Medications Prior to Admission  Medication Sig Dispense Refill  . amLODipine (NORVASC) 5 MG tablet Take 7.5  mg by mouth daily.    Marland Kitchen aspirin EC 81 MG tablet Take 81 mg by mouth at bedtime.    Marland Kitchen atorvastatin (LIPITOR) 80 MG tablet Take 80 mg by mouth at bedtime.    . cloNIDine (CATAPRES) 0.2 MG tablet Take 0.2 mg by mouth 2 (two) times daily.    . furosemide (LASIX) 40 MG tablet Take 40 mg by mouth daily.    Marland Kitchen glipiZIDE-metformin (METAGLIP) 5-500 MG per tablet Take 2 tablets by mouth 2 (two) times daily.  11  . KLOR-CON 10 10 MEQ tablet TAKE 1 TABLET BY MOUTH 3 TIMES A DAY 270 tablet 2  . losartan (COZAAR) 100 MG tablet Take 200 mg by mouth daily.     . nebivolol (BYSTOLIC) 10 MG tablet Take 10 mg by mouth daily.    . niacin (NIASPAN) 500 MG CR tablet Take 500 mg by mouth at bedtime.      Home: Home Living Family/patient expects to be discharged to:: Private residence Living Arrangements: Spouse/significant other Available Help at Discharge: Family Type of Home: House  Functional History: Prior Function Comments: was at football game prior to admission Functional Status:  Mobility:     Ambulation/Gait Ambulation Distance (Feet): 16 Feet Gait velocity: decreased General Gait Details: patient with left lateral lean noted during ambulation attempt, upon reaching ~16 feet, patient LEs gave way completely and patient propped upon therapist knee and wall while nurses acquired chair and provided physical assist to elevate patient (+3) off of therapist knee into chair, then patient transfered from chair into recliner with moderate assist of 2 persons (inattention to Left side, multi cues for awarenes of objects)    ADL:    Cognition: Cognition Orientation Level: Oriented X4    Blood pressure (!) 165/88, pulse 62, temperature 97.9 F (36.6 C), temperature source Oral, resp. rate 15, height 5' 9.5" (1.765 m), weight 89.1 kg (196 lb 6.9 oz), SpO2 100 %. Physical Exam    General: No acute distress Mood and affect are appropriate Heart: Regular rate and rhythm no rubs  murmurs or extra sounds Lungs: Clear to auscultation, breathing unlabored, no rales or wheezes Abdomen: Positive bowel sounds, soft nontender to palpation, nondistended Extremities: No clubbing, cyanosis, or edema Skin: No evidence of breakdown, no evidence of rash Neurologic: Cranial nerves II through XII intact, motor strength is 5/5 in R deltoid, bicep, tricep, grip, hip flexor, knee extensors, ankle dorsiflexor and plantar flexor 4+ in same muscle groups on Left side Sensory exam normal sensation to light touch and proprioception in bilateral upper and lower extremities, patient does note there is a difference in light touch sensation between the left hand and the right hand Cerebellar exam normal finger to nose to finger as well as heel to shin in right upper and lower extremities Mild dysmetria in left finger-nose-finger and left heel-to-shin Musculoskeletal: Full range of motion in all 4 extremities. No joint swelling  Lab Results Last 24 Hours       Results for orders placed or performed during the hospital encounter of 03/22/16 (from the past 24 hour(s))  Glucose, capillary     Status: Abnormal   Collection Time: 03/22/16 12:33 PM  Result Value Ref Range  Glucose-Capillary 169 (H) 65 - 99 mg/dL  Glucose, capillary     Status: Abnormal   Collection Time: 03/22/16  4:04 PM  Result Value Ref Range   Glucose-Capillary 219 (H) 65 - 99 mg/dL  Glucose, capillary     Status: Abnormal   Collection Time: 03/22/16  9:18 PM  Result Value Ref Range   Glucose-Capillary 157 (H) 65 - 99 mg/dL  Basic metabolic panel     Status: Abnormal   Collection Time: 03/23/16  3:52 AM  Result Value Ref Range   Sodium 138 135 - 145 mmol/L   Potassium 3.4 (L) 3.5 - 5.1 mmol/L   Chloride 109 101 - 111 mmol/L   CO2 22 22 - 32 mmol/L   Glucose, Bld 124 (H) 65 - 99 mg/dL   BUN 10 6 - 20 mg/dL   Creatinine, Ser 0.92 0.61 - 1.24 mg/dL   Calcium 9.0 8.9 - 10.3 mg/dL   GFR calc non Af Amer  >60 >60 mL/min   GFR calc Af Amer >60 >60 mL/min   Anion gap 7 5 - 15  Glucose, capillary     Status: Abnormal   Collection Time: 03/23/16  8:05 AM  Result Value Ref Range   Glucose-Capillary 125 (H) 65 - 99 mg/dL      Imaging Results (Last 48 hours)  Ct Angio Head W Or Wo Contrast  Result Date: 03/23/2016 CLINICAL DATA:  75 y/o  M; follow-up of intracranial hemorrhage. EXAM: CT ANGIOGRAPHY HEAD AND NECK TECHNIQUE: Multidetector CT imaging of the head and neck was performed using the standard protocol during bolus administration of intravenous contrast. Multiplanar CT image reconstructions and MIPs were obtained to evaluate the vascular anatomy. Carotid stenosis measurements (when applicable) are obtained utilizing NASCET criteria, using the distal internal carotid diameter as the denominator. CONTRAST:  50 cc Isovue 370. COMPARISON:  03/21/2016 CT head. FINDINGS: CT HEAD FINDINGS Brain: Stable acute hemorrhage within the right posterior basal ganglia. Stable punctate density in left putaminal possibly representing an additional hemorrhage. Stable background of advanced chronic microvascular ischemic changes and mild parenchymal volume loss for age. No evidence for new intracranial hemorrhage, large territory infarct, or focal mass effect. No extra-axial collection. Stable ventricle size. Vascular: As below. Skull: Normal. Negative for fracture or focal lesion. Sinuses: Patchy opacification of ethmoid sinuses and small mucous retention cysts within the alveolar recesses of maxillary sinuses. Otherwise negative. Orbits: Negative Review of the MIP images confirms the above findings CTA NECK FINDINGS Aortic arch: Standard branching. Imaged portion shows no evidence of aneurysm or dissection. No significant stenosis of the major arch vessel origins. Aortic atherosclerosis with mild calcification. Right carotid system: No evidence of dissection, stenosis (50% or greater) or occlusion. Mild mixed  plaque of common carotid artery and extensive calcified plaque carotid bifurcation and proximal internal carotid artery. Mild proximal ICA stenosis, 20%. Left carotid system: No evidence of dissection, stenosis (50% or greater) or occlusion. Mild mixed plaque of common carotid artery and extensive calcified plaque carotid bifurcation and proximal internal carotid artery. Mild proximal ICA stenosis, 20%. Vertebral arteries: Tortuosity and atherosclerosis of left proximal vertebral artery origin with moderate to severe stenosis. Calcified plaque of right vertebral artery origin, accurate assessment of luminal stenosis is precluded by dense calcification. Vertebral arteries are otherwise widely patent. Skeleton: No acute osseous abnormality. Severe cervical spondylosis with discogenic and facet arthropathy. Moderate to severe multilevel canal stenosis greatest at the C3-4 level where there is probable cord impingement. Multilevel moderate to severe bony  foraminal narrowing greater on the right from C3 through C5. Other neck: 10 mm right thyroid lobe nodule. No mass or inflammatory process of the neck identified. Upper chest: Clear. Review of the MIP images confirms the above findings CTA HEAD FINDINGS Anterior circulation: No significant stenosis, proximal occlusion, aneurysm, or vascular malformation. Dense calcific atherosclerosis carotid siphons with mild underlying stenosis. Triangular 2 mm anteriorly directed outpouching of carotid terminus likely represents a tiny vessel infundibulum (series 502, image 257). Posterior circulation: No significant stenosis, proximal occlusion, aneurysm, or vascular malformation. Venous sinuses: As permitted by contrast timing, patent. Anatomic variants: Bilateral fetal posterior cerebral arteries. No anterior communicating artery identified, likely hypoplastic or absent. Delayed phase: No abnormal enhancement. Review of the MIP images confirms the above findings IMPRESSION: 1.  Stable hemorrhage and right posterior basal ganglia and possible punctate hemorrhage within the left putamen. 2. No new acute intracranial abnormality identified. 3. Mild 20% stenosis of proximal internal carotid arteries bilaterally. 4. Plaque and tortuosity of proximal left vertebral artery and dense calcified plaque of right vertebral artery origins with probable at least moderate underlying stenosis. 5. No significant stenosis, proximal occlusion, aneurysm, or vascular malformation of the circle Willis. Specifically no abnormal vascularity in region of right posterior basal ganglia hemorrhage. 6. Intracranial atherosclerosis with dense calcification of carotid siphons and areas of mild stenosis. Electronically Signed   By: Kristine Garbe M.D.   On: 03/23/2016 02:49   Ct Head Wo Contrast  Result Date: 03/21/2016 CLINICAL DATA:  Initial evaluation for acute weakness, ataxic gait. EXAM: CT HEAD WITHOUT CONTRAST TECHNIQUE: Contiguous axial images were obtained from the base of the skull through the vertex without intravenous contrast. COMPARISON:  Prior CT from 05/03/2005. FINDINGS: Brain: Generalized age-related cerebral atrophy. Moderately advanced chronic microvascular ischemic disease. There is an acute intraparenchymal hemorrhage centered at the right basal ganglia that measures 14 x 21 x 28 mm (estimated volume 4 cc). Mild localized vasogenic edema without significant mass effect. Hemorrhages centered at the posterior limb of the right internal capsule. No intraventricular extension. No midline shift. Punctate 6 mm hyperdensity within the left lentiform nucleus also noted, also suspicious for a possible tiny parenchymal hemorrhage (series 2, image 12). No other acute large vessel territory infarct. No mass lesion, midline shift or or mass effect. No hydrocephalus. No extra-axial fluid collection. Vascular: No hyperdense vessel. Prominent vascular calcifications present within the carotid  siphons. Skull: Scalp soft tissues within normal limits.  Calvarium intact. Sinuses/Orbits: No acute abnormality about the globes in orbits. Remote defect noted at the left lamina papyracea. Scattered mucosal thickening within the ethmoidal air cells and partially visualized maxillary sinuses. No mastoid effusion. IMPRESSION: 1. Acute intraparenchymal hemorrhage centered at the right basal ganglia measuring 14 x 21 x 20 mm (estimated volume 4 cc). Mild localized edema without significant mass effect. No intraventricular extension. 2. Probable additional punctate 6 mm parenchymal hemorrhage within the left lentiform nucleus, no associated edema. Underlying hypertensive etiology is suspected. 3. Generalized age-related cerebral atrophy with moderate chronic microvascular ischemic disease. Critical Value/emergent results were called by telephone at the time of interpretation on 03/21/2016 at 10:52 pm to Dr. Charlotte Crumb , who verbally acknowledged these results. Electronically Signed   By: Jeannine Boga M.D.   On: 03/21/2016 23:02   Ct Angio Neck W Or Wo Contrast  Result Date: 03/23/2016 CLINICAL DATA:  75 y/o  M; follow-up of intracranial hemorrhage. EXAM: CT ANGIOGRAPHY HEAD AND NECK TECHNIQUE: Multidetector CT imaging of the head and neck was  performed using the standard protocol during bolus administration of intravenous contrast. Multiplanar CT image reconstructions and MIPs were obtained to evaluate the vascular anatomy. Carotid stenosis measurements (when applicable) are obtained utilizing NASCET criteria, using the distal internal carotid diameter as the denominator. CONTRAST:  50 cc Isovue 370. COMPARISON:  03/21/2016 CT head. FINDINGS: CT HEAD FINDINGS Brain: Stable acute hemorrhage within the right posterior basal ganglia. Stable punctate density in left putaminal possibly representing an additional hemorrhage. Stable background of advanced chronic microvascular ischemic changes and mild  parenchymal volume loss for age. No evidence for new intracranial hemorrhage, large territory infarct, or focal mass effect. No extra-axial collection. Stable ventricle size. Vascular: As below. Skull: Normal. Negative for fracture or focal lesion. Sinuses: Patchy opacification of ethmoid sinuses and small mucous retention cysts within the alveolar recesses of maxillary sinuses. Otherwise negative. Orbits: Negative Review of the MIP images confirms the above findings CTA NECK FINDINGS Aortic arch: Standard branching. Imaged portion shows no evidence of aneurysm or dissection. No significant stenosis of the major arch vessel origins. Aortic atherosclerosis with mild calcification. Right carotid system: No evidence of dissection, stenosis (50% or greater) or occlusion. Mild mixed plaque of common carotid artery and extensive calcified plaque carotid bifurcation and proximal internal carotid artery. Mild proximal ICA stenosis, 20%. Left carotid system: No evidence of dissection, stenosis (50% or greater) or occlusion. Mild mixed plaque of common carotid artery and extensive calcified plaque carotid bifurcation and proximal internal carotid artery. Mild proximal ICA stenosis, 20%. Vertebral arteries: Tortuosity and atherosclerosis of left proximal vertebral artery origin with moderate to severe stenosis. Calcified plaque of right vertebral artery origin, accurate assessment of luminal stenosis is precluded by dense calcification. Vertebral arteries are otherwise widely patent. Skeleton: No acute osseous abnormality. Severe cervical spondylosis with discogenic and facet arthropathy. Moderate to severe multilevel canal stenosis greatest at the C3-4 level where there is probable cord impingement. Multilevel moderate to severe bony foraminal narrowing greater on the right from C3 through C5. Other neck: 10 mm right thyroid lobe nodule. No mass or inflammatory process of the neck identified. Upper chest: Clear. Review of the  MIP images confirms the above findings CTA HEAD FINDINGS Anterior circulation: No significant stenosis, proximal occlusion, aneurysm, or vascular malformation. Dense calcific atherosclerosis carotid siphons with mild underlying stenosis. Triangular 2 mm anteriorly directed outpouching of carotid terminus likely represents a tiny vessel infundibulum (series 502, image 257). Posterior circulation: No significant stenosis, proximal occlusion, aneurysm, or vascular malformation. Venous sinuses: As permitted by contrast timing, patent. Anatomic variants: Bilateral fetal posterior cerebral arteries. No anterior communicating artery identified, likely hypoplastic or absent. Delayed phase: No abnormal enhancement. Review of the MIP images confirms the above findings IMPRESSION: 1. Stable hemorrhage and right posterior basal ganglia and possible punctate hemorrhage within the left putamen. 2. No new acute intracranial abnormality identified. 3. Mild 20% stenosis of proximal internal carotid arteries bilaterally. 4. Plaque and tortuosity of proximal left vertebral artery and dense calcified plaque of right vertebral artery origins with probable at least moderate underlying stenosis. 5. No significant stenosis, proximal occlusion, aneurysm, or vascular malformation of the circle Willis. Specifically no abnormal vascularity in region of right posterior basal ganglia hemorrhage. 6. Intracranial atherosclerosis with dense calcification of carotid siphons and areas of mild stenosis. Electronically Signed   By: Kristine Garbe M.D.   On: 03/23/2016 02:49   Dg Chest Port 1 View  Result Date: 03/21/2016 CLINICAL DATA:  75 y/o M; sudden onset of weakness mostly in the  legs with severe hypertension. EXAM: PORTABLE CHEST 1 VIEW COMPARISON:  12/12/2010 chest radiating FINDINGS: Stable cardiac silhouette within normal limits. Aortic atherosclerosis with arch calcifications. Two lead pacemaker with pacing unit changed from  prior study. Clear lungs. No pneumothorax or pleural effusion. Rightward curvature and degenerative changes of thoracic spine. IMPRESSION: No acute pulmonary process identified. Electronically Signed   By: Kristine Garbe M.D.   On: 03/21/2016 22:31     Assessment/Plan: Diagnosis: Mild left hemiparesis and ataxia after small right basal ganglia intracranial hemorrhage due to hypertension 1. Does the need for close, 24 hr/day medical supervision in concert with the patient's rehab needs make it unreasonable for this patient to be served in a less intensive setting? Potentially 2. Co-Morbidities requiring supervision/potential complications: Hypertension 3. Due to bladder management, bowel management, safety, skin/wound care, disease management, medication administration, pain management and patient education, does the patient require 24 hr/day rehab nursing? Potentially 4. Does the patient require coordinated care of a physician, rehab nurse, PT (1-2 hrs/day, 5 days/week) and OT (1-2 hrs/day, 5 days/week) to address physical and functional deficits in the context of the above medical diagnosis(es)? Potentially Addressing deficits in the following areas: balance, endurance, locomotion, strength, transferring, bowel/bladder control, bathing, dressing and toileting 5. Can the patient actively participate in an intensive therapy program of at least 3 hrs of therapy per day at least 5 days per week? Yes 6. The potential for patient to make measurable gains while on inpatient rehab is excellent 7. Anticipated functional outcomes upon discharge from inpatients are Mod I/Sup PT, Mod I/Sup OT, NA SLP 8. Estimated rehab length of stay to reach the above functional goals is: 5-7d 9. Does the patient have adequate social supports to accommodate these discharge functional goals? Yes 10. Anticipated D/C setting: Home 11. Anticipated post D/C treatments: [place] therapy 12. Overall Rehab/Functional Prognosis:  excellent  RECOMMENDATIONS: This patient's condition is appropriate for continued rehabilitative care in the following setting: If patient continues to require physical assistance by hospital day 3-4. Would recommend CIR Patient has agreed to participate in recommended program. Yes Note that insurance prior authorization may be required for reimbursement for recommended care.  Comment:  Patient making rapid improvement, may progress to a supervision modified level in the next day or 2, we will need PT/OT follow-up as well as rehabilitation admission Coordinator follow-up   Richard Alvarado 03/23/2016     Routing History

## 2016-03-24 NOTE — Discharge Summary (Signed)
Stroke Discharge Summary  Patient ID: Richard Alvarado   MRN: [id number]      DOB: 1941/02/15  Date of Admission: 03/22/2016 Date of Discharge: 03/24/2016  Attending Physician:  Garvin Fila, MD, Stroke MD Consulting Physician(s):   Alysia Penna, MD (Physical Medicine & Rehabtilitation)  Patient's PCP:  Alonza Bogus, MD  Discharge Diagnoses:  Principal Problem:   ICH (intracerebral hemorrhage) ([place]) - R basal ganglia Active Problems:   History of CVA (cerebrovascular accident)   Pacemaker St. Jude victory, dual chamber, 2006   Hyperlipidemia   Diabetes mellitus type 2 in nonobese [facility])   Hypertensive emergency   Family history of stroke   OSA (obstructive sleep apnea)   Hypokalemia  Past Medical History:  Diagnosis Date  . Cerebral atherosclerosis    CAROTID DOPPLER, 09/07/2009 - RIGHT AND LEFT CCAs-small-moderate amount of irregular mixed density plaque, no significant evidence of diameter reduction; RIGHT AND LEFT ICAs-moderate amount irregular mixed density plaque, no evidence of significant diameter reduction  . CVA (cerebral vascular accident) ([place]) 1994   Left brain  . History of CVA (cerebrovascular accident) 04/24/2013  . Hypertension    RENAL DOPPLER, 03/08/2009 - normal  . Hypertension in pregnancy, preeclampsia, severe 04/24/2013  . Obstructive sleep apnea 09/26/2005   AHI-19.46, during REM-36.88  . Pacemaker St. Jude victory, dual chamber, 2006 04/24/2013  . Pericardial effusion    2D ECHO, 03/07/2011 - EF >70%, normal  . Second degree heart block 04/24/2013   Past Surgical History:  Procedure Laterality Date  . EP IMPLANTABLE DEVICE N/A 11/21/2014   Procedure: PPM/BIV PPM Generator Changeout;  Surgeon: [name], MD;  Location: [place] [facility];  Service: Cardiovascular;  Laterality: N/A;  . NM MYOVIEW LTD  03/31/2012   Normal study, no ECG changes, EKG negative for ischemia, post-stress EF 54%  . PACEMAKER INSERTION  05/09/2005   St. Jude  [place], [place] [id number], serial [phone]    Medications to be continued on Rehab . amLODipine  10 mg Oral Daily  . cloNIDine  0.2 mg Oral BID  . furosemide  40 mg Oral Daily  . glipiZIDE-metformin  2 tablet Oral BID  . losartan  100 mg Oral BID  . nebivolol  10 mg Oral Daily  . pantoprazole  40 mg Oral Daily  . potassium chloride  20 mEq Oral BID  . senna-docusate  1 tablet Oral BID    LABORATORY STUDIES CBC    Component Value Date/Time   WBC 5.1 03/24/2016 0357   RBC 5.38 03/24/2016 0357   HGB 12.9 (L) 03/24/2016 0357   HCT 41.0 03/24/2016 0357   PLT 235 03/24/2016 0357   MCV 76.2 (L) 03/24/2016 0357   MCH 24.0 (L) 03/24/2016 0357   MCHC 31.5 03/24/2016 0357   RDW 14.2 03/24/2016 0357   LYMPHSABS 1.9 03/21/2016 2208   MONOABS 0.8 03/21/2016 2208   EOSABS 0.2 03/21/2016 2208   BASOSABS 0.1 03/21/2016 2208   CMP    Component Value Date/Time   NA 137 03/24/2016 0357   K 3.5 03/24/2016 0357   CL 105 03/24/2016 0357   CO2 24 03/24/2016 0357   GLUCOSE 126 (H) 03/24/2016 0357   BUN 12 03/24/2016 0357   CREATININE 1.26 (H) 03/24/2016 0357   CREATININE 1.09 11/14/2014 1102   CALCIUM 9.2 03/24/2016 0357   PROT 9.4 (H) 03/21/2016 2208   ALBUMIN 4.7 03/21/2016 2208   AST 33 03/21/2016 2208   ALT 23 03/21/2016 2208   ALKPHOS  91 03/21/2016 2208   BILITOT 0.6 03/21/2016 2208   GFRNONAA 54 (L) 03/24/2016 0357   GFRAA >60 03/24/2016 0357   COAGS Lab Results  Component Value Date   INR 1.06 03/21/2016   INR 1.21 11/14/2014   Lipid Panel    Component Value Date/Time   CHOL 111 03/22/2016 0528   TRIG 43 03/22/2016 0528   HDL 34 (L) 03/22/2016 0528   CHOLHDL 3.3 03/22/2016 0528   VLDL 9 03/22/2016 0528   LDLCALC 68 03/22/2016 0528   HgbA1C  Lab Results  Component Value Date   HGBA1C 6.9 (H) 03/22/2016   Cardiac Panel (last 3 results)   Recent Labs  03/21/16 2208  TROPONINI <0.03   Urinalysis    Component Value Date/Time   COLORURINE COLORLESS (A)  03/21/2016 2208   APPEARANCEUR CLEAR (A) 03/21/2016 2208   LABSPEC 1.004 (L) 03/21/2016 2208   PHURINE 7.0 03/21/2016 2208   GLUCOSEU 50 (A) 03/21/2016 2208   HGBUR NEGATIVE 03/21/2016 2208   BILIRUBINUR NEGATIVE 03/21/2016 2208   KETONESUR NEGATIVE 03/21/2016 2208   PROTEINUR 100 (A) 03/21/2016 2208   NITRITE NEGATIVE 03/21/2016 2208   LEUKOCYTESUR NEGATIVE 03/21/2016 2208   Urine Drug Screen     Component Value Date/Time   LABOPIA NONE DETECTED 03/21/2016 2208   COCAINSCRNUR NONE DETECTED 03/21/2016 2208   LABBENZ NONE DETECTED 03/21/2016 2208   AMPHETMU NONE DETECTED 03/21/2016 2208   THCU NONE DETECTED 03/21/2016 2208   LABBARB NONE DETECTED 03/21/2016 2208    Alcohol Level    Component Value Date/Time   ETH <5 03/21/2016 2208     SIGNIFICANT DIAGNOSTIC STUDIES    Ct Head Wo Contrast 03/21/2016 1. Acute intraparenchymal hemorrhage centered at the right basal ganglia measuring 14 x 21 x 20 mm  (estimated volume 4 cc). Mild localized edema without significant mass effect. No intraventricular extension.  2. Probable additional punctate 6 mm parenchymal hemorrhage within the left lentiform nucleus, no associated edema. Underlying hypertensive etiology is suspected.  3. Generalized age-related cerebral atrophy with moderate chronic microvascular ischemic disease.   CTA head and neck  03/23/2016 1. Stable hemorrhage and right posterior basal ganglia and possible punctate hemorrhage within the left putamen. 2. No new acute intracranial abnormality identified. 3. Mild 20% stenosis of proximal internal carotid arteries bilaterally. 4. Plaque and tortuosity of proximal left vertebral artery and dense calcified plaque of right vertebral artery origins with probable at least moderate underlying stenosis. 5. No significant stenosis, proximal occlusion, aneurysm, or vascular malformation of the circle Willis. Specifically no abnormal vascularity in region of right posterior basal  ganglia hemorrhage. 6. Intracranial atherosclerosis with dense calcification of carotid siphons and areas of mild stenosis.  2D Echocardiogram - Left ventricle: The cavity size was normal. Wall thickness wasincreased in a pattern of moderate LVH. Systolic function wasnormal. The estimated ejection fraction was in the range of 60%to 65%. Wall motion was normal; there were no regional wallmotion abnormalities. Doppler parameters are consistent withabnormal left ventricular relaxation (grade 1 diastolicdysfunction). - Mitral valve: Mildly calcified leaflets . - Right ventricle: Pacer wire or catheter noted in right ventricle. - Right atrium: Pacer wire or catheter noted in right atrium. - Atrial septum: No defect or patent foramen ovale was identified.  Dg Chest Port 1 View 03/21/2016 No acute pulmonary process identified.    HISTORY OF PRESENT ILLNESS History is obtained directly from the patient who is an excellent historian. Dr. Shon Hale also discussed the patient with the transferring ED MD Burlene Arnt) and  with the transport team on their arrival at [facility] with the patient.   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 When he stood up after the game, he felt like both of his legs were asleep and he had difficulty standing and walking. He had to be assisted by one of the coaches. They called 911 and EMS arrived to find initial blood pressure to 280/180. After discussion with the ED physician at [facility], they placed 1 inch of nitroglycerin paste with minimal change in his blood pressure. He was taken to [facility] where he was noted to have a normal neurological examination CT scan of the head was obtained and showed a small acute intracranial hemorrhage involving the right basal ganglia. He was given 40 mg of labetalol in the emergency department, again with little response in his blood pressure. At this point he was placed on a nicardipine  infusion which is gradually been titrated to a dose of 10 mg per hour with good response. He is now transferred to [facility] for further management.  The patient reports that he has not had any headache. He denies any vision changes. He's had no difficulty talking or swallowing. He presently denies any weakness or numbness, and states that his lower extremity symptoms only lasted for a few minutes.    HOSPITAL COURSE Mr. JAVAE HATHCOAT is a 75 y.o. male with history of second-degree heart block, permanent pacemaker, obstructive sleep apnea, hypertension, previous stroke 2014, and carotid artery disease presenting with lower extremity numbness and elevated blood pressure. He did not receive IV t-PA due to [place].   Stroke:  Right BG small ICH and questionable left BG tiny ICH, secondary to hypertension.  Resultant  Mild left hemiparesis  MRI / MRA - not indicated - PPM  CT -  Acute small ICH at the right BG and questionable left BG tiny ICH   Repeat CT stable hematoma  CTA H&N - no AVM or aneurysm or significant carotid stenosis, but athero at b/l siphon and proximal carotid bifurcation  2D Echo EF 60-65%. No source of embolus   LDL - 68  HgbA1c - 6.9  aspirin 81 mg daily prior to admission, now on No antithrombotic secondary to hemorrhage   Ongoing aggressive stroke risk factor management  Therapy recommendations:  CIR  Disposition: CIR  Diabetes, type II  HgbA1c 6.9  Stroke goal < 7.0   On glipizide-metformin 5/500 mg bid PTA  Resumed   Hypertensive Emergency  BP 280/180 on arrival in setting of neurologic symptoms  Treated with cardene  Resumed home medication  In hopsital SBP goal < 160  Long-term BP goal normotensive  Stable at discharge  Hyperlipidemia  Home meds:  Lipitor 80 mg daily prior to admission.   LDL 68, goal < 70  Continue statin at discharge  Other Stroke Risk Factors  Advanced age  Former smoker. Quit  2000.  Overweight, Body mass index is 28.59 kg/m., recommend weight loss, diet and exercise as appropriate   Hx stroke/TIA  Family hx stroke (mother)  Obstructive sleep apnea  Other Active Problems  Hypokalemia - 3.1 (Lasix 40 mg daily) Supplement - recheck ->3.4->3.5, resolved   DISCHARGE EXAM Blood pressure (!) 186/84, pulse 63, temperature 97.6 F (36.4 C), temperature source Oral, resp. rate 19, height 5' 9.5" (1.765 m), weight 89.1 kg (196 lb 6.9 oz), SpO2 100 %. General - Well nourished, well developed, in no apparent distress.  Ophthalmologic - Fundi not visualized due to small pupils.  Cardiovascular - Regular rate and rhythm.  Mental Status -  Level of arousal and orientation to time, place, and person were intact. Language including expression, naming, repetition, comprehension was assessed and found intact. Fund of Knowledge was assessed and was intact.  Cranial Nerves II - XII - II - Visual field intact OU. III, IV, VI - Extraocular movements intact. V - Facial sensation intact bilaterally. VII - left nasolabial fold flattening. VIII - Hearing & vestibular intact bilaterally. X - Palate elevates symmetrically. XI - Chin turning & shoulder shrug intact bilaterally. XII - Tongue protrusion intact.  Motor Strength - The patient's strength was normal in all extremities except left pronator drift and left iliopsoas 4+/5.  Bulk was normal and fasciculations were absent.   Motor Tone - Muscle tone was assessed at the neck and appendages and was normal.  Reflexes - The patient's reflexes were 1+ in all extremities and he had no pathological reflexes.  Sensory - Light touch, temperature/pinprick were assessed and were symmetrical.    Coordination - The patient had mild ataxia on the left FTN a little out of proportion of his left UE weakness.  Tremor was absent.  Gait and Station - deferred due to safety concerns.  Discharge Diet  Diet Heart Room  service appropriate? Yes; Fluid consistency: Thin liquids  DISCHARGE PLAN  Disposition:  Transfer to [place] for ongoing PT, OT and ST  Recommend ongoing risk factor control by Primary Care Physician at time of discharge from inpatient rehabilitation.  Follow-up HAWKINS,EDWARD L, MD in 2 weeks following discharge from rehab.  Follow-up with Dr. Rosalin Hawking, Stroke Clinic in 2 months, office to schedule an appointment.   35 minutes were spent preparing discharge.  Betances [place] for Pager information 03/24/2016 2:01 PM   I have personally examined this patient, reviewed notes, independently viewed imaging studies, participated in medical decision making and plan of care.ROS completed by me personally and pertinent positives fully documented  I have made any additions or clarifications directly to the above note. Agree with note above.    Antony Contras, MD Medical Director [facility] Stroke Center Pager: [phone] 03/25/2016 5:49 PM

## 2016-03-24 NOTE — Progress Notes (Signed)
STROKE TEAM PROGRESS NOTE   SUBJECTIVE (INTERVAL HISTORY) Patient is up in the chair. NT is making his bed. His son is at the bedside. Son is concerned about his leg being week. Await to hear from therapy, rehab coordinator.Neurologically stable for discharge to rehabilitation.     OBJECTIVE Temp:  [97.7 F (36.5 C)-98 F (36.7 C)] 98 F (36.7 C) (10/16 0542) Pulse Rate:  [58-61] 60 (10/16 0542) Cardiac Rhythm: Normal sinus rhythm;Heart block (10/16 0731) Resp:  [16-20] 18 (10/16 0542) BP: (151-198)/(72-98) 151/72 (10/16 0542) SpO2:  [97 %-100 %] 100 % (10/16 0542)  CBC:   Recent Labs Lab 03/21/16 2208 03/24/16 0357  WBC 5.2 5.1  NEUTROABS 2.3  --   HGB 15.0 12.9*  HCT 47.0 41.0  MCV 76.7* 76.2*  PLT 260 [id number]    Basic Metabolic Panel:   Recent Labs Lab 03/23/16 0352 03/24/16 0357  NA 138 137  K 3.4* 3.5  CL 109 105  CO2 22 24  GLUCOSE 124* 126*  BUN 10 12  CREATININE 0.92 1.26*  CALCIUM 9.0 9.2   HgbA1c:  Lab Results  Component Value Date   HGBA1C 6.9 (H) 03/22/2016    IMAGING No results found.   TTE  - Left ventricle: The cavity size was normal. Wall thickness was increased in a pattern of moderate LVH. Systolic function was normal. The estimated ejection fraction was in the range of 60% to 65%. Wall motion was normal; there were no regional wall motion abnormalities. Doppler parameters are consistent with abnormal left ventricular relaxation (grade 1 diastolic dysfunction). - Mitral valve: Mildly calcified leaflets . - Right ventricle: Pacer wire or catheter noted in right ventricle. - Right atrium: Pacer wire or catheter noted in right atrium. - Atrial septum: No defect or patent foramen ovale was identified.   PHYSICAL EXAM General - Well nourished, well developed, in no apparent distress.  Ophthalmologic - Fundi not visualized due to small pupils.  Cardiovascular - Regular rate and rhythm.  Mental Status -  Level of arousal and orientation  to time, place, and person were intact. Language including expression, naming, repetition, comprehension was assessed and found intact. Fund of Knowledge was assessed and was intact.  Cranial Nerves II - XII - II - Visual field intact OU. III, IV, VI - Extraocular movements intact. V - Facial sensation intact bilaterally. VII - left nasolabial fold flattening. VIII - Hearing & vestibular intact bilaterally. X - Palate elevates symmetrically. XI - Chin turning & shoulder shrug intact bilaterally. XII - Tongue protrusion intact.  Motor Strength - The patient's strength was normal in all extremities except left pronator drift and left iliopsoas 4+/5.  Bulk was normal and fasciculations were absent.   Motor Tone - Muscle tone was assessed at the neck and appendages and was normal.  Reflexes - The patient's reflexes were 1+ in all extremities and he had no pathological reflexes.  Sensory - Light touch, temperature/pinprick were assessed and were symmetrical.    Coordination - The patient had mild ataxia on the left FTN a little out of proportion of his left UE weakness.  Tremor was absent.  Gait and Station - deferred due to safety concerns.   ASSESSMENT/PLAN [name] is a 75 y.o. male with history of second-degree heart block, permanent pacemaker, obstructive sleep apnea, hypertension, previous stroke 2014, and carotid artery disease presenting with lower extremity numbness and elevated blood pressure. He did not receive IV t-PA due to [place].   Stroke:  Right BG small ICH and questionable left BG tiny ICH, secondary to hypertension.  Resultant  Mild left hemiparesis  MRI / MRA - not indicated - PPM  CT -  Acute small ICH at the right BG and questionable left BG tiny ICH   Repeat CT stable hematoma  CTA H&N - no AVM or aneurysm or significant carotid stenosis, but athero at b/l siphon and proximal carotid bifurcation  2D Echo EF 60-65%. No source of embolus   LDL -  68  HgbA1c - 6.9  VTE prophylaxis - SCDs Diet Heart Room service appropriate? Yes; Fluid consistency: Thin  aspirin 81 mg daily prior to admission, now on No antithrombotic secondary to hemorrhage   Ongoing aggressive stroke risk factor management  Therapy recommendations:  CIR - admissions coordinator is following  Disposition: pending   Medically ready for discharge from stroke standpoint  Diabetes, type II  HgbA1c 6.9  Stroke goal < 7.0   On glipizide-metformin 5/500 mg bid  followup PCP  Hypertension  Stable   BP goal < 160  Off cardene  Resume home medication  Labetalol PRN for SBP>180  Long-term BP goal normotensive  Hyperlipidemia  Home meds:  Lipitor 80 mg daily prior to admission. Not resumed.  LDL 68, goal < 70  Continue statin at discharge  Other Stroke Risk Factors  Advanced age  Former smoker. Quit 2000.  Overweight, Body mass index is 28.59 kg/m., recommend weight loss, diet and exercise as appropriate   Hx stroke/TIA  Family hx stroke (mother)  Obstructive sleep apnea  Other Active Problems  Hypokalemia - 3.1 (Lasix 40 mg daily) Supplement - recheck ->3.4->3.5  Hospital day # [place] for Pager information 03/24/2016 10:44 AM  I have personally examined this patient, reviewed notes, independently viewed imaging studies, participated in medical decision making and plan of care.ROS completed by me personally and pertinent positives fully documented  I have made any additions or clarifications directly to the above note. Agree with note above.  Panlobular out of bed. Continue physical therapy and rehabilitation consult. Medically stable for transfer to rehabilitation bed available. Discussed with patient and son and answered questions.  Antony Contras, MD Medical Director [facility] Stroke Center Pager: [phone] 03/24/2016 1:25 PM  To contact Stroke Continuity provider, please  refer to [email or web address] After hours, contact General Neurology

## 2016-03-24 NOTE — H&P (Signed)
Physical Medicine and Rehabilitation Admission H&P    CC: loss of balance   HPI: Richard Alvarado is a 75 year old male with history of HTN, OSA,  PPM, prior CVA 2014;  who was admitted on 03/21/16 with development of BLE weakness and difficulty standing/walking while at football game. UDS negative.  He was taken to [facility] where CT head done revealing small acute R-BG hemorrhage and elevated BP. He was started on cardene drip and transferred to [facility] for treatment/evalution. CTA head/neck done revealing stable acute punctate right posterior BG hemorrhage, stable advanced microvascular disease, mild 20% stenosis proximal ICA, likely moderate stenosis L-VA and intracranial atherosclerosis.  2D echo done revealing EF 60-65% with no wall abnormality and mild calcification of MV.  Patient with resultant mild left facial weakness, left pronator drift, decrease in [facility] and mild left LE weakness. PT evaluation done this weekend revealing left lateral lean, BLE weakness and giveaway with limited mobility and left inattention. CIR recommended for follow up therapy.   Review of Systems  HENT: Negative for hearing loss.   Eyes: Negative for blurred vision and double vision.  Respiratory: Negative for cough, shortness of breath and wheezing.   Cardiovascular: Negative for chest pain and palpitations.  Gastrointestinal: Negative for constipation, heartburn and nausea.  Genitourinary: Negative for dysuria and frequency.  Musculoskeletal: Negative for back pain, joint pain, myalgias and neck pain.  Neurological: Positive for focal weakness. Negative for dizziness, tingling, weakness and headaches.  Psychiatric/Behavioral: Negative for depression. The patient is not nervous/anxious and does not have insomnia.    Past Medical History:  Diagnosis Date  . Cerebral atherosclerosis    CAROTID DOPPLER, 09/07/2009 - RIGHT AND LEFT CCAs-small-moderate amount of irregular mixed density plaque, no significant  evidence of diameter reduction; RIGHT AND LEFT ICAs-moderate amount irregular mixed density plaque, no evidence of significant diameter reduction  . CVA (cerebral vascular accident) ([place]) 1994   Left brain  . History of CVA (cerebrovascular accident) 04/24/2013  . Hypertension    RENAL DOPPLER, 03/08/2009 - normal  . Hypertension in pregnancy, preeclampsia, severe 04/24/2013  . Obstructive sleep apnea 09/26/2005   AHI-19.46, during REM-36.88  . Pacemaker [employer] Jude victory, dual chamber, 2006 04/24/2013  . Pericardial effusion    2D ECHO, 03/07/2011 - EF >70%, normal  . Second degree heart block 04/24/2013   Past Surgical History:  Procedure Laterality Date  . EP IMPLANTABLE DEVICE N/A 11/21/2014   Procedure: PPM/BIV PPM Generator Changeout;  Surgeon: [name], MD;  Location: [facility];  Service: Cardiovascular;  Laterality: N/A;  . NM MYOVIEW LTD  03/31/2012   Normal study, no ECG changes, EKG negative for ischemia, post-stress EF 54%  . PACEMAKER INSERTION  05/09/2005   [employer] Jude [place], [place] #[id number], serial #[id number]   Family History  Problem Relation Age of Onset  . CVA Mother   . Hypertension Sister   . Heart disease Maternal Grandmother     Social History:  Married. Retired Engineer, structural. He reports that he quit smoking about 16 years ago--used to smoke a pipe. He has never used smokeless tobacco. He reports that he does not drink alcohol or use drugs.    Allergies: No Known Allergies    Medications Prior to Admission  Medication Sig Dispense Refill  . amLODipine (NORVASC) 5 MG tablet Take 7.5 mg by mouth daily.    Marland Kitchen aspirin EC 81 MG tablet Take 81 mg by mouth at bedtime.    Marland Kitchen atorvastatin (LIPITOR)  80 MG tablet Take 80 mg by mouth at bedtime.    . cloNIDine (CATAPRES) 0.2 MG tablet Take 0.2 mg by mouth 2 (two) times daily.    . furosemide (LASIX) 40 MG tablet Take 40 mg by mouth daily.    Marland Kitchen glipiZIDE-metformin (METAGLIP) 5-500 MG per tablet Take 2 tablets by  mouth 2 (two) times daily.  11  . KLOR-CON 10 10 MEQ tablet TAKE 1 TABLET BY MOUTH 3 TIMES A DAY 270 tablet 2  . losartan (COZAAR) 100 MG tablet Take 200 mg by mouth daily.     . nebivolol (BYSTOLIC) 10 MG tablet Take 10 mg by mouth daily.    . niacin (NIASPAN) 500 MG CR tablet Take 500 mg by mouth at bedtime.      Home: Home Living Family/patient expects to be discharged to:: Private residence Living Arrangements: Spouse/significant other Available Help at Discharge: Family Type of Home: House   Functional History: Prior Function Level of Independence: Independent Comments: was at football game prior to admission  Functional Status:  Mobility: Bed Mobility Overal bed mobility: Needs Assistance Bed Mobility: Supine to Sit Supine to sit: Min assist General bed mobility comments: min assist to come to EOB via UE pull to sit Transfers Overall transfer level: Needs assistance Equipment used: 1 person hand held assist (wrap around support with gait belt) Transfers: Sit to/from Stand Sit to Stand: Min assist General transfer comment: min assist to power up to standing, noted use of bed against calves for stability in addition to HHA and gait belt support Ambulation/Gait Ambulation/Gait assistance: Mod assist, Total assist (moderate assist for amb, total assist after 66f LE buckled) Ambulation Distance (Feet): 16 Feet Assistive device: 1 person hand held assist (wrap around support with therapist providing lateral assist) Gait Pattern/deviations: Step-to pattern, Decreased stride length, Ataxic, Narrow base of support (left lateral lean noted) General Gait Details: patient with left lateral lean noted during ambulation attempt, upon reaching ~16 feet, patient LEs gave way completely and patient propped upon therapist knee and wall while nurses acquired chair and provided physical assist to elevate patient (+3) off of therapist knee into chair, then patient transfered from chair into  recliner with moderate assist of 2 persons (inattention to Left side, multi cues for awarenes of objects) Gait velocity: decreased Gait velocity interpretation: Below normal speed for age/gender    ADL:    Cognition: Cognition Orientation Level: Oriented X4    Physical Exam: Blood pressure (!) 186/84, pulse 63, temperature 97.6 F (36.4 C), temperature source Oral, resp. rate 19, height 5' 9.5" (1.765 m), weight 89.1 kg (196 lb 6.9 oz), SpO2 100 %. Physical Exam  Nursing note and vitals reviewed. Constitutional: He is oriented to person, place, and time. He appears well-developed and well-nourished.  HENT:  Head: Normocephalic and atraumatic.  Eyes: Conjunctivae are normal. Pupils are equal, round, and reactive to light.  Neck: Normal range of motion. Neck supple.  Cardiovascular: Normal rate and regular rhythm.   Respiratory: Effort normal and breath sounds normal. No respiratory distress. He has no wheezes.  GI: Soft. Bowel sounds are normal. He exhibits no distension. There is no tenderness.  Musculoskeletal: He exhibits no deformity.  Neurological: He is alert and oriented to person, place, and time.  Cognitvely intact with normal memory, insight and awareness. Strength 5/5 bilateral UE proximal to distal. LE also 5/5 bilaterally proximal to distal. LUE and LLE limb ataxia with decreased FTN, HTN. Sensation left arm and leg 1+/2 for light touch.  Skin: Skin is warm and dry.  Psychiatric: He has a normal mood and affect. His behavior is normal. Thought content normal.    Results for orders placed or performed during the hospital encounter of 03/22/16 (from the past 48 hour(s))  Glucose, capillary     Status: Abnormal   Collection Time: 03/22/16 12:33 PM  Result Value Ref Range   Glucose-Capillary 169 (H) 65 - 99 mg/dL  Glucose, capillary     Status: Abnormal   Collection Time: 03/22/16  4:04 PM  Result Value Ref Range   Glucose-Capillary 219 (H) 65 - 99 mg/dL  Glucose,  capillary     Status: Abnormal   Collection Time: 03/22/16  9:18 PM  Result Value Ref Range   Glucose-Capillary 157 (H) 65 - 99 mg/dL  Basic metabolic panel     Status: Abnormal   Collection Time: 03/23/16  3:52 AM  Result Value Ref Range   Sodium 138 135 - 145 mmol/L   Potassium 3.4 (L) 3.5 - 5.1 mmol/L   Chloride 109 101 - 111 mmol/L   CO2 22 22 - 32 mmol/L   Glucose, Bld 124 (H) 65 - 99 mg/dL   BUN 10 6 - 20 mg/dL   Creatinine, Ser 0.92 0.61 - 1.24 mg/dL   Calcium 9.0 8.9 - 10.3 mg/dL   GFR calc non Af Amer >60 >60 mL/min   GFR calc Af Amer >60 >60 mL/min    Comment: (NOTE) The eGFR has been calculated using the CKD EPI equation. This calculation has not been validated in all clinical situations. eGFR's persistently <60 mL/min signify possible Chronic Kidney Disease.    Anion gap 7 5 - 15  Glucose, capillary     Status: Abnormal   Collection Time: 03/23/16  8:05 AM  Result Value Ref Range   Glucose-Capillary 125 (H) 65 - 99 mg/dL  Basic metabolic panel     Status: Abnormal   Collection Time: 03/24/16  3:57 AM  Result Value Ref Range   Sodium 137 135 - 145 mmol/L   Potassium 3.5 3.5 - 5.1 mmol/L   Chloride 105 101 - 111 mmol/L   CO2 24 22 - 32 mmol/L   Glucose, Bld 126 (H) 65 - 99 mg/dL   BUN 12 6 - 20 mg/dL   Creatinine, Ser 1.26 (H) 0.61 - 1.24 mg/dL   Calcium 9.2 8.9 - 10.3 mg/dL   GFR calc non Af Amer 54 (L) >60 mL/min   GFR calc Af Amer >60 >60 mL/min    Comment: (NOTE) The eGFR has been calculated using the CKD EPI equation. This calculation has not been validated in all clinical situations. eGFR's persistently <60 mL/min signify possible Chronic Kidney Disease.    Anion gap 8 5 - 15  CBC     Status: Abnormal   Collection Time: 03/24/16  3:57 AM  Result Value Ref Range   WBC 5.1 4.0 - 10.5 K/uL   RBC 5.38 4.22 - 5.81 MIL/uL   Hemoglobin 12.9 (L) 13.0 - 17.0 g/dL   HCT 41.0 39.0 - 52.0 %   MCV 76.2 (L) 78.0 - 100.0 fL   MCH 24.0 (L) 26.0 - 34.0 pg    MCHC 31.5 30.0 - 36.0 g/dL   RDW 14.2 11.5 - 15.5 %   Platelets 235 150 - 400 K/uL   Ct Angio Head W Or Wo Contrast  Result Date: 03/23/2016 CLINICAL DATA:  75 y/o  M; follow-up of intracranial hemorrhage. EXAM: CT ANGIOGRAPHY HEAD AND NECK  TECHNIQUE: Multidetector CT imaging of the head and neck was performed using the standard protocol during bolus administration of intravenous contrast. Multiplanar CT image reconstructions and MIPs were obtained to evaluate the vascular anatomy. Carotid stenosis measurements (when applicable) are obtained utilizing NASCET criteria, using the distal internal carotid diameter as the denominator. CONTRAST:  50 cc Isovue 370. COMPARISON:  03/21/2016 CT head. FINDINGS: CT HEAD FINDINGS Brain: Stable acute hemorrhage within the right posterior basal ganglia. Stable punctate density in left putaminal possibly representing an additional hemorrhage. Stable background of advanced chronic microvascular ischemic changes and mild parenchymal volume loss for age. No evidence for new intracranial hemorrhage, large territory infarct, or focal mass effect. No extra-axial collection. Stable ventricle size. Vascular: As below. Skull: Normal. Negative for fracture or focal lesion. Sinuses: Patchy opacification of ethmoid sinuses and small mucous retention cysts within the alveolar recesses of maxillary sinuses. Otherwise negative. Orbits: Negative Review of the MIP images confirms the above findings CTA NECK FINDINGS Aortic arch: Standard branching. Imaged portion shows no evidence of aneurysm or dissection. No significant stenosis of the major arch vessel origins. Aortic atherosclerosis with mild calcification. Right carotid system: No evidence of dissection, stenosis (50% or greater) or occlusion. Mild mixed plaque of common carotid artery and extensive calcified plaque carotid bifurcation and proximal internal carotid artery. Mild proximal ICA stenosis, 20%. Left carotid system: No  evidence of dissection, stenosis (50% or greater) or occlusion. Mild mixed plaque of common carotid artery and extensive calcified plaque carotid bifurcation and proximal internal carotid artery. Mild proximal ICA stenosis, 20%. Vertebral arteries: Tortuosity and atherosclerosis of left proximal vertebral artery origin with moderate to severe stenosis. Calcified plaque of right vertebral artery origin, accurate assessment of luminal stenosis is precluded by dense calcification. Vertebral arteries are otherwise widely patent. Skeleton: No acute osseous abnormality. Severe cervical spondylosis with discogenic and facet arthropathy. Moderate to severe multilevel canal stenosis greatest at the C3-4 level where there is probable cord impingement. Multilevel moderate to severe bony foraminal narrowing greater on the right from C3 through C5. Other neck: 10 mm right thyroid lobe nodule. No mass or inflammatory process of the neck identified. Upper chest: Clear. Review of the MIP images confirms the above findings CTA HEAD FINDINGS Anterior circulation: No significant stenosis, proximal occlusion, aneurysm, or vascular malformation. Dense calcific atherosclerosis carotid siphons with mild underlying stenosis. Triangular 2 mm anteriorly directed outpouching of carotid terminus likely represents a tiny vessel infundibulum (series 502, image 257). Posterior circulation: No significant stenosis, proximal occlusion, aneurysm, or vascular malformation. Venous sinuses: As permitted by contrast timing, patent. Anatomic variants: Bilateral fetal posterior cerebral arteries. No anterior communicating artery identified, likely hypoplastic or absent. Delayed phase: No abnormal enhancement. Review of the MIP images confirms the above findings IMPRESSION: 1. Stable hemorrhage and right posterior basal ganglia and possible punctate hemorrhage within the left putamen. 2. No new acute intracranial abnormality identified. 3. Mild 20%  stenosis of proximal internal carotid arteries bilaterally. 4. Plaque and tortuosity of proximal left vertebral artery and dense calcified plaque of right vertebral artery origins with probable at least moderate underlying stenosis. 5. No significant stenosis, proximal occlusion, aneurysm, or vascular malformation of the circle Willis. Specifically no abnormal vascularity in region of right posterior basal ganglia hemorrhage. 6. Intracranial atherosclerosis with dense calcification of carotid siphons and areas of mild stenosis. Electronically Signed   By: Kristine Garbe M.D.   On: 03/23/2016 02:49   Ct Angio Neck W Or Wo Contrast  Result Date: 03/23/2016 CLINICAL  DATA:  75 y/o  M; follow-up of intracranial hemorrhage. EXAM: CT ANGIOGRAPHY HEAD AND NECK TECHNIQUE: Multidetector CT imaging of the head and neck was performed using the standard protocol during bolus administration of intravenous contrast. Multiplanar CT image reconstructions and MIPs were obtained to evaluate the vascular anatomy. Carotid stenosis measurements (when applicable) are obtained utilizing NASCET criteria, using the distal internal carotid diameter as the denominator. CONTRAST:  50 cc Isovue 370. COMPARISON:  03/21/2016 CT head. FINDINGS: CT HEAD FINDINGS Brain: Stable acute hemorrhage within the right posterior basal ganglia. Stable punctate density in left putaminal possibly representing an additional hemorrhage. Stable background of advanced chronic microvascular ischemic changes and mild parenchymal volume loss for age. No evidence for new intracranial hemorrhage, large territory infarct, or focal mass effect. No extra-axial collection. Stable ventricle size. Vascular: As below. Skull: Normal. Negative for fracture or focal lesion. Sinuses: Patchy opacification of ethmoid sinuses and small mucous retention cysts within the alveolar recesses of maxillary sinuses. Otherwise negative. Orbits: Negative Review of the MIP images  confirms the above findings CTA NECK FINDINGS Aortic arch: Standard branching. Imaged portion shows no evidence of aneurysm or dissection. No significant stenosis of the major arch vessel origins. Aortic atherosclerosis with mild calcification. Right carotid system: No evidence of dissection, stenosis (50% or greater) or occlusion. Mild mixed plaque of common carotid artery and extensive calcified plaque carotid bifurcation and proximal internal carotid artery. Mild proximal ICA stenosis, 20%. Left carotid system: No evidence of dissection, stenosis (50% or greater) or occlusion. Mild mixed plaque of common carotid artery and extensive calcified plaque carotid bifurcation and proximal internal carotid artery. Mild proximal ICA stenosis, 20%. Vertebral arteries: Tortuosity and atherosclerosis of left proximal vertebral artery origin with moderate to severe stenosis. Calcified plaque of right vertebral artery origin, accurate assessment of luminal stenosis is precluded by dense calcification. Vertebral arteries are otherwise widely patent. Skeleton: No acute osseous abnormality. Severe cervical spondylosis with discogenic and facet arthropathy. Moderate to severe multilevel canal stenosis greatest at the C3-4 level where there is probable cord impingement. Multilevel moderate to severe bony foraminal narrowing greater on the right from C3 through C5. Other neck: 10 mm right thyroid lobe nodule. No mass or inflammatory process of the neck identified. Upper chest: Clear. Review of the MIP images confirms the above findings CTA HEAD FINDINGS Anterior circulation: No significant stenosis, proximal occlusion, aneurysm, or vascular malformation. Dense calcific atherosclerosis carotid siphons with mild underlying stenosis. Triangular 2 mm anteriorly directed outpouching of carotid terminus likely represents a tiny vessel infundibulum (series 502, image 257). Posterior circulation: No significant stenosis, proximal occlusion,  aneurysm, or vascular malformation. Venous sinuses: As permitted by contrast timing, patent. Anatomic variants: Bilateral fetal posterior cerebral arteries. No anterior communicating artery identified, likely hypoplastic or absent. Delayed phase: No abnormal enhancement. Review of the MIP images confirms the above findings IMPRESSION: 1. Stable hemorrhage and right posterior basal ganglia and possible punctate hemorrhage within the left putamen. 2. No new acute intracranial abnormality identified. 3. Mild 20% stenosis of proximal internal carotid arteries bilaterally. 4. Plaque and tortuosity of proximal left vertebral artery and dense calcified plaque of right vertebral artery origins with probable at least moderate underlying stenosis. 5. No significant stenosis, proximal occlusion, aneurysm, or vascular malformation of the circle Willis. Specifically no abnormal vascularity in region of right posterior basal ganglia hemorrhage. 6. Intracranial atherosclerosis with dense calcification of carotid siphons and areas of mild stenosis. Electronically Signed   By: Kristine Garbe M.D.   On:  03/23/2016 02:49       Medical Problem List and Plan: 1.  Ataxia, gait disorder secondary to right basal ganglia hemorrhage  -admit to inpatient rehab 2.  DVT Prophylaxis/Anticoagulation: Mechanical: Sequential compression devices, below knee Bilateral lower extremities-start lovenox in am.  3. Pain Management:  N/A 4. Mood: LCSW to follow for evaluation and support.  5. Neuropsych: This patient is capable of making decisions on his own behalf. 6. Skin/Wound Care: Routine pressure relief measures 7. Fluids/Electrolytes/Nutrition: Monitor I/O. Check lytes in am.  8.HTN: monitor BP tid. Continue Norvasc, catapress, lasix, bystolic and losartan. 9.T2DM: Monitor BS ac/hs. Appetite has been good. Continue metformin and glipizide.  10 Dyslipidemia: Resume at discharge.      Post Admission Physician  Evaluation: 1. Functional deficits secondary  to right basal ganglia hemorrhage. 2. Patient is admitted to receive collaborative, interdisciplinary care between the physiatrist, rehab nursing staff, and therapy team. 3. Patient's level of medical complexity and substantial therapy needs in context of that medical necessity cannot be provided at a lesser intensity of care such as a SNF. 4. Patient has experienced substantial functional loss from his/her baseline which was documented above under the "Functional History" and "Functional Status" headings.  Judging by the patient's diagnosis, physical exam, and functional history, the patient has potential for functional progress which will result in measurable gains while on inpatient rehab.  These gains will be of substantial and practical use upon discharge  in facilitating mobility and self-care at the household level. 5. Physiatrist will provide 24 hour management of medical needs as well as oversight of the therapy plan/treatment and provide guidance as appropriate regarding the interaction of the two. 6. 24 hour rehab nursing will assist with bladder management, bowel management, safety, skin/wound care, disease management, medication administration, pain management and patient education  and help integrate therapy concepts, techniques,education, etc. 7. PT will assess and treat for/with: Lower extremity strength, range of motion, stamina, balance, functional mobility, safety, adaptive techniques and equipment, NMR, pt education, .   Goals are: mod I. 8. OT will assess and treat for/with: ADL's, functional mobility, safety, upper extremity strength, adaptive techniques and equipment, NMR, ego support, community reintegration.   Goals are: mod I. Therapy may proceed with showering this patient. 9. SLP will assess and treat for/with: n/a.  Goals are: n/a. 10. Case Management and Social Worker will assess and treat for psychological issues and discharge  planning. 11. Team conference will be held weekly to assess progress toward goals and to determine barriers to discharge. 12. Patient will receive at least 3 hours of therapy per day at least 5 days per week. 13. ELOS: 7-10 days       14. Prognosis:  excellent     Meredith Staggers, MD, [facility] 03/24/2016  03/24/2016

## 2016-03-24 NOTE — Progress Notes (Signed)
Inpatient Diabetes Program Recommendations  AACE/ADA: New Consensus Statement on Inpatient Glycemic Control (2015)  Target Ranges:  Prepandial:   less than 140 mg/dL      Peak postprandial:   less than 180 mg/dL (1-2 hours)      Critically ill patients:  140 - 180 mg/dL   Lab Results  Component Value Date   GLUCAP 125 (H) 03/23/2016   HGBA1C 6.9 (H) 03/22/2016    Review of Glycemic Control  Diabetes history: DM2 Outpatient Diabetes medications: Metaglip 5/500 mg bid Current orders for Inpatient glycemic control: None  HgbA1C - 6.9% indicates good glycemic control at home.  Inpatient Diabetes Program Recommendations:    Add Novolog sensitive tidwc. Add metformin 500 mg bid   Will continue to follow. Thank you. Lorenda Peck, RD, LDN, CDE Inpatient Diabetes Coordinator [phone]

## 2016-03-24 NOTE — Evaluation (Signed)
Occupational Therapy Evaluation Patient Details Name: Richard Alvarado MRN: [id number] DOB: 1941/03/05 Today's Date: 03/24/2016    History of Present Illness 75 y.o.malewith history of second-degree heart block, permanent pacemaker, obstructive sleep apnea, hypertension, previous stroke 2014, and carotid artery diseasepresenting with lower extremity numbnessand elevated blood pressure. Imaging revealed + Stroke: Right BG small ICH and questionable left BG tiny ICH,secondary to hypertension   Clinical Impression   Pt admitted with the above diagnosis and has the deficits outlined below. Pt would benefit from cont OT to increase independence in basic adls in standing and functional mobility with adls as well as tending to the L environment.  Pt would benefit from inpt rehab before returning home to increase independence with these task before returning home with his wife that is available to assist 24/7.      Follow Up Recommendations  CIR;Supervision/Assistance - 24 hour    Equipment Recommendations  3 in 1 bedside comode;Tub/shower bench    Recommendations for Other Services       Precautions / Restrictions Precautions Precautions: Fall Restrictions Weight Bearing Restrictions: No      Mobility Bed Mobility               General bed mobility comments: in chair on arrival.  Transfers Overall transfer level: Needs assistance Equipment used: 1 person hand held assist Transfers: Sit to/from [facility] Sit to Stand: Min assist Stand pivot transfers: Mod assist       General transfer comment: min assist to power up to standing, noted use of bed against calves for stability in addition to HHA and gait belt support    Balance Overall balance assessment: Needs assistance Sitting-balance support: Feet supported Sitting balance-Leahy Scale: Good     Standing balance support: Bilateral upper extremity supported;During functional  activity Standing balance-Leahy Scale: Poor Standing balance comment: Pt must have outside assist to stand.  Pt not safe when both hands are not on the walker.                            ADL Overall ADL's : Needs assistance/impaired Eating/Feeding: Independent;Sitting   Grooming: Wash/dry hands;Wash/dry face;Oral care;Minimal assistance;Standing Grooming Details (indicate cue type and reason): min assist to cue pt to not lean to the Left.  Pt placed self in front of mirror off to the left as well.  Was able to see my face in mirror on R and L w/o difficulty.  Pt unable to control secretions while brushing but needed cues to lean over sink so they would not run down his clothes.  Pt required a appx 4 sec to realize water still running when he was done but did shut off w/o cues. Upper Body Bathing: Set up;Sitting Upper Body Bathing Details (indicate cue type and reason): slightly uncoordinated with use of LUE during task. Lower Body Bathing: Minimal assistance;Sit to/from stand;Cueing for compensatory techniques Lower Body Bathing Details (indicate cue type and reason): Pt requires min assist when on his feet to maintain balance. Pt lets go of walker with both hands and loses balance and requires cues to slow down and remember to hold to walker with one hand at all times at this point. Upper Body Dressing : Minimal assistance;Sitting Upper Body Dressing Details (indicate cue type and reason): min assist with fastners.  pt stood to put on gown. pt let go of walker with both hands and had 2 losses of balance requring min assist  to recover while putting on gown.  Pt required min cues to fix L side of gown where it had been caught but pt did not realize it. Lower Body Dressing: Minimal assistance;Sit to/from stand Lower Body Dressing Details (indicate cue type and reason): Pt requires mod assist once on his feet to pull pants up and do fasteners. Toilet Transfer: Moderate  assistance;Ambulation;RW;Comfort height toilet;Cueing for safety Toilet Transfer Details (indicate cue type and reason): Pt requires mod assist to control placement of the LLE when walking and coordinate walker use.  See PT notes.  Toileting- Clothing Manipulation and Hygiene: Minimal assistance;Sit to/from stand Toileting - Clothing Manipulation Details (indicate cue type and reason): min assist for balance.     Functional mobility during ADLs: Moderate assistance;+2 for safety/equipment;Rolling walker;Cueing for sequencing General ADL Comments: Pt requires mod assist with most adls in standing.  When sitting, pt can do most adls without assist.     Vision Vision Assessment?: No apparent visual deficits Additional Comments: Vision tested.  Pt does not appear to have deficits but does tend to  neglect his L side some and when standing in front of the mirror sometimes stands to where L side of his face is not in mirror.     Perception Perception Perception Tested?: No   Praxis Praxis Praxis tested?: Within functional limits    Pertinent Vitals/Pain Pain Assessment: No/denies pain     Hand Dominance Right   Extremity/Trunk Assessment Upper Extremity Assessment Upper Extremity Assessment: LUE deficits/detail LUE Deficits / Details: AROM WFL. Strength WFL 4+/5 compared to RUE.  pt with dysmetria and slow with FNF and RAM.   LUE Coordination: decreased fine motor;decreased gross motor   Lower Extremity Assessment Lower Extremity Assessment: Defer to PT evaluation   Cervical / Trunk Assessment Cervical / Trunk Assessment: Normal   Communication Communication Communication: No difficulties   Cognition Arousal/Alertness: Awake/alert Behavior During Therapy: WFL for tasks assessed/performed Overall Cognitive Status: Within Functional Limits for tasks assessed                     General Comments       Exercises       Shoulder Instructions      Home Living  Family/patient expects to be discharged to:: Private residence Living Arrangements: Spouse/significant other Available Help at Discharge: Family;Available 24 hours/day Type of Home: House Home Access: Stairs to enter [employer] of Steps: 2   Home Layout: One level     Bathroom Shower/Tub: Corporate investment banker: Standard     Home Equipment: None          Prior Functioning/Environment Level of Independence: Independent        Comments: was at football game prior to admission        OT Problem List: Decreased strength;Decreased coordination;Impaired vision/perception;Decreased knowledge of use of DME or AE;Decreased safety awareness;Decreased knowledge of precautions;Impaired UE functional use   OT Treatment/Interventions: Self-care/ADL training;DME and/or AE instruction;Therapeutic activities    OT Goals(Current goals can be found in the care plan section) Acute Rehab OT Goals Patient Stated Goal: to get better OT Goal Formulation: With patient/family Time For Goal Achievement: 04/07/16 Potential to Achieve Goals: Good ADL Goals Pt Will Perform Grooming: with supervision;standing Pt Will Perform Lower Body Bathing: with supervision;sit to/from stand Pt Will Perform Lower Body Dressing: with supervision;sit to/from stand Pt Will Perform Tub/Shower Transfer: Tub transfer;with supervision;ambulating;tub bench;rolling walker Additional ADL Goal #1: Pt will walk to bathroom and do  all toileting tasks on 3:1 over commode with close S.  OT Frequency: Min 2X/week   Barriers to D/C:            Co-evaluation              End of Session Equipment Utilized During Treatment: Rolling walker;Gait belt Nurse Communication: Mobility status  Activity Tolerance: Patient tolerated treatment well Patient left: Other (comment) (with PT in hallway)   Time: [id number] OT Time Calculation (min): 24 min Charges:  OT General Charges $OT Visit: 1  Procedure OT Evaluation $OT Eval Moderate Complexity: 1 Procedure OT Treatments $Self Care/Home Management : 8-22 mins G-Codes:    Glenford Peers 03-25-16, 12:04 PM  [phone]

## 2016-03-24 NOTE — H&P (View-Only) (Signed)
Physical Medicine and Rehabilitation Admission H&P    CC: loss of balance   HPI: Richard Alvarado is a 75 year old male with history of HTN, OSA,  PPM, prior CVA 2014;  who was admitted on 03/21/16 with development of BLE weakness and difficulty standing/walking while at football game. UDS negative.  He was taken to [facility] where CT head done revealing small acute R-BG hemorrhage and elevated BP. He was started on cardene drip and transferred to [facility] for treatment/evalution. CTA head/neck done revealing stable acute punctate right posterior BG hemorrhage, stable advanced microvascular disease, mild 20% stenosis proximal ICA, likely moderate stenosis L-VA and intracranial atherosclerosis.  2D echo done revealing EF 60-65% with no wall abnormality and mild calcification of MV.  Patient with resultant mild left facial weakness, left pronator drift, decrease in [facility] and mild left LE weakness. PT evaluation done this weekend revealing left lateral lean, BLE weakness and giveaway with limited mobility and left inattention. CIR recommended for follow up therapy.   Review of Systems  HENT: Negative for hearing loss.   Eyes: Negative for blurred vision and double vision.  Respiratory: Negative for cough, shortness of breath and wheezing.   Cardiovascular: Negative for chest pain and palpitations.  Gastrointestinal: Negative for constipation, heartburn and nausea.  Genitourinary: Negative for dysuria and frequency.  Musculoskeletal: Negative for back pain, joint pain, myalgias and neck pain.  Neurological: Positive for focal weakness. Negative for dizziness, tingling, weakness and headaches.  Psychiatric/Behavioral: Negative for depression. The patient is not nervous/anxious and does not have insomnia.    Past Medical History:  Diagnosis Date  . Cerebral atherosclerosis    CAROTID DOPPLER, 09/07/2009 - RIGHT AND LEFT CCAs-small-moderate amount of irregular mixed density plaque, no significant  evidence of diameter reduction; RIGHT AND LEFT ICAs-moderate amount irregular mixed density plaque, no evidence of significant diameter reduction  . CVA (cerebral vascular accident) ([place]) 1994   Left brain  . History of CVA (cerebrovascular accident) 04/24/2013  . Hypertension    RENAL DOPPLER, 03/08/2009 - normal  . Hypertension in pregnancy, preeclampsia, severe 04/24/2013  . Obstructive sleep apnea 09/26/2005   AHI-19.46, during REM-36.88  . Pacemaker [employer] Jude victory, dual chamber, 2006 04/24/2013  . Pericardial effusion    2D ECHO, 03/07/2011 - EF >70%, normal  . Second degree heart block 04/24/2013   Past Surgical History:  Procedure Laterality Date  . EP IMPLANTABLE DEVICE N/A 11/21/2014   Procedure: PPM/BIV PPM Generator Changeout;  Surgeon: [name], MD;  Location: [facility];  Service: Cardiovascular;  Laterality: N/A;  . NM MYOVIEW LTD  03/31/2012   Normal study, no ECG changes, EKG negative for ischemia, post-stress EF 54%  . PACEMAKER INSERTION  05/09/2005   St. Jude [place], [place] #[id number], serial #[id number]   Family History  Problem Relation Age of Onset  . CVA Mother   . Hypertension Sister   . Heart disease Maternal Grandmother     Social History:  Married. Retired Engineer, structural. He reports that he quit smoking about 16 years ago--used to smoke a pipe. He has never used smokeless tobacco. He reports that he does not drink alcohol or use drugs.    Allergies: No Known Allergies    Medications Prior to Admission  Medication Sig Dispense Refill  . amLODipine (NORVASC) 5 MG tablet Take 7.5 mg by mouth daily.    Marland Kitchen aspirin EC 81 MG tablet Take 81 mg by mouth at bedtime.    Marland Kitchen atorvastatin (LIPITOR)  80 MG tablet Take 80 mg by mouth at bedtime.    . cloNIDine (CATAPRES) 0.2 MG tablet Take 0.2 mg by mouth 2 (two) times daily.    . furosemide (LASIX) 40 MG tablet Take 40 mg by mouth daily.    Marland Kitchen glipiZIDE-metformin (METAGLIP) 5-500 MG per tablet Take 2 tablets by  mouth 2 (two) times daily.  11  . KLOR-CON 10 10 MEQ tablet TAKE 1 TABLET BY MOUTH 3 TIMES A DAY 270 tablet 2  . losartan (COZAAR) 100 MG tablet Take 200 mg by mouth daily.     . nebivolol (BYSTOLIC) 10 MG tablet Take 10 mg by mouth daily.    . niacin (NIASPAN) 500 MG CR tablet Take 500 mg by mouth at bedtime.      Home: Home Living Family/patient expects to be discharged to:: Private residence Living Arrangements: Spouse/significant other Available Help at Discharge: Family Type of Home: House   Functional History: Prior Function Level of Independence: Independent Comments: was at football game prior to admission  Functional Status:  Mobility: Bed Mobility Overal bed mobility: Needs Assistance Bed Mobility: Supine to Sit Supine to sit: Min assist General bed mobility comments: min assist to come to EOB via UE pull to sit Transfers Overall transfer level: Needs assistance Equipment used: 1 person hand held assist (wrap around support with gait belt) Transfers: Sit to/from Stand Sit to Stand: Min assist General transfer comment: min assist to power up to standing, noted use of bed against calves for stability in addition to HHA and gait belt support Ambulation/Gait Ambulation/Gait assistance: Mod assist, Total assist (moderate assist for amb, total assist after 49f LE buckled) Ambulation Distance (Feet): 16 Feet Assistive device: 1 person hand held assist (wrap around support with therapist providing lateral assist) Gait Pattern/deviations: Step-to pattern, Decreased stride length, Ataxic, Narrow base of support (left lateral lean noted) General Gait Details: patient with left lateral lean noted during ambulation attempt, upon reaching ~16 feet, patient LEs gave way completely and patient propped upon therapist knee and wall while nurses acquired chair and provided physical assist to elevate patient (+3) off of therapist knee into chair, then patient transfered from chair into  recliner with moderate assist of 2 persons (inattention to Left side, multi cues for awarenes of objects) Gait velocity: decreased Gait velocity interpretation: Below normal speed for age/gender    ADL:    Cognition: Cognition Orientation Level: Oriented X4    Physical Exam: Blood pressure (!) 186/84, pulse 63, temperature 97.6 F (36.4 C), temperature source Oral, resp. rate 19, height 5' 9.5" (1.765 m), weight 89.1 kg (196 lb 6.9 oz), SpO2 100 %. Physical Exam  Nursing note and vitals reviewed. Constitutional: He is oriented to person, place, and time. He appears well-developed and well-nourished.  HENT:  Head: Normocephalic and atraumatic.  Eyes: Conjunctivae are normal. Pupils are equal, round, and reactive to light.  Neck: Normal range of motion. Neck supple.  Cardiovascular: Normal rate and regular rhythm.   Respiratory: Effort normal and breath sounds normal. No respiratory distress. He has no wheezes.  GI: Soft. Bowel sounds are normal. He exhibits no distension. There is no tenderness.  Musculoskeletal: He exhibits no deformity.  Neurological: He is alert and oriented to person, place, and time.  Cognitvely intact with normal memory, insight and awareness. Strength 5/5 bilateral UE proximal to distal. LE also 5/5 bilaterally proximal to distal. LUE and LLE limb ataxia with decreased FTN, HTN. Sensation left arm and leg 1+/2 for light touch.  Skin: Skin is warm and dry.  Psychiatric: He has a normal mood and affect. His behavior is normal. Thought content normal.    Results for orders placed or performed during the hospital encounter of 03/22/16 (from the past 48 hour(s))  Glucose, capillary     Status: Abnormal   Collection Time: 03/22/16 12:33 PM  Result Value Ref Range   Glucose-Capillary 169 (H) 65 - 99 mg/dL  Glucose, capillary     Status: Abnormal   Collection Time: 03/22/16  4:04 PM  Result Value Ref Range   Glucose-Capillary 219 (H) 65 - 99 mg/dL  Glucose,  capillary     Status: Abnormal   Collection Time: 03/22/16  9:18 PM  Result Value Ref Range   Glucose-Capillary 157 (H) 65 - 99 mg/dL  Basic metabolic panel     Status: Abnormal   Collection Time: 03/23/16  3:52 AM  Result Value Ref Range   Sodium 138 135 - 145 mmol/L   Potassium 3.4 (L) 3.5 - 5.1 mmol/L   Chloride 109 101 - 111 mmol/L   CO2 22 22 - 32 mmol/L   Glucose, Bld 124 (H) 65 - 99 mg/dL   BUN 10 6 - 20 mg/dL   Creatinine, Ser 0.92 0.61 - 1.24 mg/dL   Calcium 9.0 8.9 - 10.3 mg/dL   GFR calc non Af Amer >60 >60 mL/min   GFR calc Af Amer >60 >60 mL/min    Comment: (NOTE) The eGFR has been calculated using the CKD EPI equation. This calculation has not been validated in all clinical situations. eGFR's persistently <60 mL/min signify possible Chronic Kidney Disease.    Anion gap 7 5 - 15  Glucose, capillary     Status: Abnormal   Collection Time: 03/23/16  8:05 AM  Result Value Ref Range   Glucose-Capillary 125 (H) 65 - 99 mg/dL  Basic metabolic panel     Status: Abnormal   Collection Time: 03/24/16  3:57 AM  Result Value Ref Range   Sodium 137 135 - 145 mmol/L   Potassium 3.5 3.5 - 5.1 mmol/L   Chloride 105 101 - 111 mmol/L   CO2 24 22 - 32 mmol/L   Glucose, Bld 126 (H) 65 - 99 mg/dL   BUN 12 6 - 20 mg/dL   Creatinine, Ser 1.26 (H) 0.61 - 1.24 mg/dL   Calcium 9.2 8.9 - 10.3 mg/dL   GFR calc non Af Amer 54 (L) >60 mL/min   GFR calc Af Amer >60 >60 mL/min    Comment: (NOTE) The eGFR has been calculated using the CKD EPI equation. This calculation has not been validated in all clinical situations. eGFR's persistently <60 mL/min signify possible Chronic Kidney Disease.    Anion gap 8 5 - 15  CBC     Status: Abnormal   Collection Time: 03/24/16  3:57 AM  Result Value Ref Range   WBC 5.1 4.0 - 10.5 K/uL   RBC 5.38 4.22 - 5.81 MIL/uL   Hemoglobin 12.9 (L) 13.0 - 17.0 g/dL   HCT 41.0 39.0 - 52.0 %   MCV 76.2 (L) 78.0 - 100.0 fL   MCH 24.0 (L) 26.0 - 34.0 pg    MCHC 31.5 30.0 - 36.0 g/dL   RDW 14.2 11.5 - 15.5 %   Platelets 235 150 - 400 K/uL   Ct Angio Head W Or Wo Contrast  Result Date: 03/23/2016 CLINICAL DATA:  75 y/o  M; follow-up of intracranial hemorrhage. EXAM: CT ANGIOGRAPHY HEAD AND NECK  TECHNIQUE: Multidetector CT imaging of the head and neck was performed using the standard protocol during bolus administration of intravenous contrast. Multiplanar CT image reconstructions and MIPs were obtained to evaluate the vascular anatomy. Carotid stenosis measurements (when applicable) are obtained utilizing NASCET criteria, using the distal internal carotid diameter as the denominator. CONTRAST:  50 cc Isovue 370. COMPARISON:  03/21/2016 CT head. FINDINGS: CT HEAD FINDINGS Brain: Stable acute hemorrhage within the right posterior basal ganglia. Stable punctate density in left putaminal possibly representing an additional hemorrhage. Stable background of advanced chronic microvascular ischemic changes and mild parenchymal volume loss for age. No evidence for new intracranial hemorrhage, large territory infarct, or focal mass effect. No extra-axial collection. Stable ventricle size. Vascular: As below. Skull: Normal. Negative for fracture or focal lesion. Sinuses: Patchy opacification of ethmoid sinuses and small mucous retention cysts within the alveolar recesses of maxillary sinuses. Otherwise negative. Orbits: Negative Review of the MIP images confirms the above findings CTA NECK FINDINGS Aortic arch: Standard branching. Imaged portion shows no evidence of aneurysm or dissection. No significant stenosis of the major arch vessel origins. Aortic atherosclerosis with mild calcification. Right carotid system: No evidence of dissection, stenosis (50% or greater) or occlusion. Mild mixed plaque of common carotid artery and extensive calcified plaque carotid bifurcation and proximal internal carotid artery. Mild proximal ICA stenosis, 20%. Left carotid system: No  evidence of dissection, stenosis (50% or greater) or occlusion. Mild mixed plaque of common carotid artery and extensive calcified plaque carotid bifurcation and proximal internal carotid artery. Mild proximal ICA stenosis, 20%. Vertebral arteries: Tortuosity and atherosclerosis of left proximal vertebral artery origin with moderate to severe stenosis. Calcified plaque of right vertebral artery origin, accurate assessment of luminal stenosis is precluded by dense calcification. Vertebral arteries are otherwise widely patent. Skeleton: No acute osseous abnormality. Severe cervical spondylosis with discogenic and facet arthropathy. Moderate to severe multilevel canal stenosis greatest at the C3-4 level where there is probable cord impingement. Multilevel moderate to severe bony foraminal narrowing greater on the right from C3 through C5. Other neck: 10 mm right thyroid lobe nodule. No mass or inflammatory process of the neck identified. Upper chest: Clear. Review of the MIP images confirms the above findings CTA HEAD FINDINGS Anterior circulation: No significant stenosis, proximal occlusion, aneurysm, or vascular malformation. Dense calcific atherosclerosis carotid siphons with mild underlying stenosis. Triangular 2 mm anteriorly directed outpouching of carotid terminus likely represents a tiny vessel infundibulum (series 502, image 257). Posterior circulation: No significant stenosis, proximal occlusion, aneurysm, or vascular malformation. Venous sinuses: As permitted by contrast timing, patent. Anatomic variants: Bilateral fetal posterior cerebral arteries. No anterior communicating artery identified, likely hypoplastic or absent. Delayed phase: No abnormal enhancement. Review of the MIP images confirms the above findings IMPRESSION: 1. Stable hemorrhage and right posterior basal ganglia and possible punctate hemorrhage within the left putamen. 2. No new acute intracranial abnormality identified. 3. Mild 20%  stenosis of proximal internal carotid arteries bilaterally. 4. Plaque and tortuosity of proximal left vertebral artery and dense calcified plaque of right vertebral artery origins with probable at least moderate underlying stenosis. 5. No significant stenosis, proximal occlusion, aneurysm, or vascular malformation of the circle Willis. Specifically no abnormal vascularity in region of right posterior basal ganglia hemorrhage. 6. Intracranial atherosclerosis with dense calcification of carotid siphons and areas of mild stenosis. Electronically Signed   By: Kristine Garbe M.D.   On: 03/23/2016 02:49   Ct Angio Neck W Or Wo Contrast  Result Date: 03/23/2016 CLINICAL  DATA:  75 y/o  M; follow-up of intracranial hemorrhage. EXAM: CT ANGIOGRAPHY HEAD AND NECK TECHNIQUE: Multidetector CT imaging of the head and neck was performed using the standard protocol during bolus administration of intravenous contrast. Multiplanar CT image reconstructions and MIPs were obtained to evaluate the vascular anatomy. Carotid stenosis measurements (when applicable) are obtained utilizing NASCET criteria, using the distal internal carotid diameter as the denominator. CONTRAST:  50 cc Isovue 370. COMPARISON:  03/21/2016 CT head. FINDINGS: CT HEAD FINDINGS Brain: Stable acute hemorrhage within the right posterior basal ganglia. Stable punctate density in left putaminal possibly representing an additional hemorrhage. Stable background of advanced chronic microvascular ischemic changes and mild parenchymal volume loss for age. No evidence for new intracranial hemorrhage, large territory infarct, or focal mass effect. No extra-axial collection. Stable ventricle size. Vascular: As below. Skull: Normal. Negative for fracture or focal lesion. Sinuses: Patchy opacification of ethmoid sinuses and small mucous retention cysts within the alveolar recesses of maxillary sinuses. Otherwise negative. Orbits: Negative Review of the MIP images  confirms the above findings CTA NECK FINDINGS Aortic arch: Standard branching. Imaged portion shows no evidence of aneurysm or dissection. No significant stenosis of the major arch vessel origins. Aortic atherosclerosis with mild calcification. Right carotid system: No evidence of dissection, stenosis (50% or greater) or occlusion. Mild mixed plaque of common carotid artery and extensive calcified plaque carotid bifurcation and proximal internal carotid artery. Mild proximal ICA stenosis, 20%. Left carotid system: No evidence of dissection, stenosis (50% or greater) or occlusion. Mild mixed plaque of common carotid artery and extensive calcified plaque carotid bifurcation and proximal internal carotid artery. Mild proximal ICA stenosis, 20%. Vertebral arteries: Tortuosity and atherosclerosis of left proximal vertebral artery origin with moderate to severe stenosis. Calcified plaque of right vertebral artery origin, accurate assessment of luminal stenosis is precluded by dense calcification. Vertebral arteries are otherwise widely patent. Skeleton: No acute osseous abnormality. Severe cervical spondylosis with discogenic and facet arthropathy. Moderate to severe multilevel canal stenosis greatest at the C3-4 level where there is probable cord impingement. Multilevel moderate to severe bony foraminal narrowing greater on the right from C3 through C5. Other neck: 10 mm right thyroid lobe nodule. No mass or inflammatory process of the neck identified. Upper chest: Clear. Review of the MIP images confirms the above findings CTA HEAD FINDINGS Anterior circulation: No significant stenosis, proximal occlusion, aneurysm, or vascular malformation. Dense calcific atherosclerosis carotid siphons with mild underlying stenosis. Triangular 2 mm anteriorly directed outpouching of carotid terminus likely represents a tiny vessel infundibulum (series 502, image 257). Posterior circulation: No significant stenosis, proximal occlusion,  aneurysm, or vascular malformation. Venous sinuses: As permitted by contrast timing, patent. Anatomic variants: Bilateral fetal posterior cerebral arteries. No anterior communicating artery identified, likely hypoplastic or absent. Delayed phase: No abnormal enhancement. Review of the MIP images confirms the above findings IMPRESSION: 1. Stable hemorrhage and right posterior basal ganglia and possible punctate hemorrhage within the left putamen. 2. No new acute intracranial abnormality identified. 3. Mild 20% stenosis of proximal internal carotid arteries bilaterally. 4. Plaque and tortuosity of proximal left vertebral artery and dense calcified plaque of right vertebral artery origins with probable at least moderate underlying stenosis. 5. No significant stenosis, proximal occlusion, aneurysm, or vascular malformation of the circle Willis. Specifically no abnormal vascularity in region of right posterior basal ganglia hemorrhage. 6. Intracranial atherosclerosis with dense calcification of carotid siphons and areas of mild stenosis. Electronically Signed   By: Kristine Garbe M.D.   On:  03/23/2016 02:49       Medical Problem List and Plan: 1.  Ataxia, gait disorder secondary to right basal ganglia hemorrhage  -admit to inpatient rehab 2.  DVT Prophylaxis/Anticoagulation: Mechanical: Sequential compression devices, below knee Bilateral lower extremities-start lovenox in am.  3. Pain Management:  N/A 4. Mood: LCSW to follow for evaluation and support.  5. Neuropsych: This patient is capable of making decisions on his own behalf. 6. Skin/Wound Care: Routine pressure relief measures 7. Fluids/Electrolytes/Nutrition: Monitor I/O. Check lytes in am.  8.HTN: monitor BP tid. Continue Norvasc, catapress, lasix, bystolic and losartan. 9.T2DM: Monitor BS ac/hs. Appetite has been good. Continue metformin and glipizide.  10 Dyslipidemia: Resume at discharge.      Post Admission Physician  Evaluation: 1. Functional deficits secondary  to right basal ganglia hemorrhage. 2. Patient is admitted to receive collaborative, interdisciplinary care between the physiatrist, rehab nursing staff, and therapy team. 3. Patient's level of medical complexity and substantial therapy needs in context of that medical necessity cannot be provided at a lesser intensity of care such as a SNF. 4. Patient has experienced substantial functional loss from his/her baseline which was documented above under the "Functional History" and "Functional Status" headings.  Judging by the patient's diagnosis, physical exam, and functional history, the patient has potential for functional progress which will result in measurable gains while on inpatient rehab.  These gains will be of substantial and practical use upon discharge  in facilitating mobility and self-care at the household level. 5. Physiatrist will provide 24 hour management of medical needs as well as oversight of the therapy plan/treatment and provide guidance as appropriate regarding the interaction of the two. 6. 24 hour rehab nursing will assist with bladder management, bowel management, safety, skin/wound care, disease management, medication administration, pain management and patient education  and help integrate therapy concepts, techniques,education, etc. 7. PT will assess and treat for/with: Lower extremity strength, range of motion, stamina, balance, functional mobility, safety, adaptive techniques and equipment, NMR, pt education, .   Goals are: mod I. 8. OT will assess and treat for/with: ADL's, functional mobility, safety, upper extremity strength, adaptive techniques and equipment, NMR, ego support, community reintegration.   Goals are: mod I. Therapy may proceed with showering this patient. 9. SLP will assess and treat for/with: n/a.  Goals are: n/a. 10. Case Management and Social Worker will assess and treat for psychological issues and discharge  planning. 11. Team conference will be held weekly to assess progress toward goals and to determine barriers to discharge. 12. Patient will receive at least 3 hours of therapy per day at least 5 days per week. 13. ELOS: 7-10 days       14. Prognosis:  excellent     Meredith Staggers, MD, [facility] 03/24/2016  03/24/2016

## 2016-03-24 NOTE — PMR Pre-admission (Signed)
PMR Admission Coordinator Pre-Admission Assessment  Patient: Richard Alvarado is an 75 y.o., male MRN: [id number] DOB: 01/08/1941 Height: 5' 9.5" (176.5 cm) Weight: 89.1 kg (196 lb 6.9 oz)              Insurance Information HMO:     PPO:      PCP:      IPA:      80/20: yes     OTHER:  No HMO PRIMARY: Medicare a and b      Policy#: [id number]      Subscriber: pt Benefits:  Phone #: passport one online Name: 03/24/16 Eff. Date: 06/10/95     Deduct: $1316      Out of Pocket Max: none      Life Max: none CIR: 100%      SNF: 20 full days Outpatient: 80%     Co-Pay: 20% Home Health: 100%      Co-Pay: none DME: 80%     Co-Pay: 20% Providers: pt choice  SECONDARY: none       Medicaid Application Date:       Case Manager:  Disability Application Date:       Case Worker:   Emergency Contact Information Contact Information    Name Relation Home Work Mobile   [place] Spouse [phone]  [phone]   Richard Alvarado   [phone]   Richard Alvarado   [phone]     Current Medical History  Patient Admitting Diagnosis: right basal ganglia hemorrhage  History of Present Illness: HPI: Richard Alvarado is a 75 year old male with history of HTN, OSA,  PPM, prior CVA 2014;  who was admitted on 03/21/16 with development of BLE weakness and difficulty standing/walking while at football game. UDS negative.  He was taken to [facility] where CT head done revealing small acute R-BG hemorrhage and elevated BP. He was started on cardene drip and transferred to [facility] for treatment/evalution. CTA head/neck done revealing stable acute punctate right posterior BG hemorrhage, stable advanced microvascular disease, mild 20% stenosis proximal ICA, likely moderate stenosis L-VA and intracranial atherosclerosis.  2D echo done revealing EF 60-65% with no wall abnormality and mild calcification of MV.  Patient with resultant mild left facial weakness, left pronator drift, decrease in [facility] LLL and mild  left LE weakness. PT evaluation done this weekend revealing left lateral lean, BLE weakness and giveaway with limited mobility and left inattention.    Total: 3 NIH    Past Medical History  Past Medical History:  Diagnosis Date  . Cerebral atherosclerosis    CAROTID DOPPLER, 09/07/2009 - RIGHT AND LEFT CCAs-small-moderate amount of irregular mixed density plaque, no significant evidence of diameter reduction; RIGHT AND LEFT ICAs-moderate amount irregular mixed density plaque, no evidence of significant diameter reduction  . CVA (cerebral vascular accident) ([place]) 1994   Left brain  . History of CVA (cerebrovascular accident) 04/24/2013  . Hypertension    RENAL DOPPLER, 03/08/2009 - normal  . Hypertension in pregnancy, preeclampsia, severe 04/24/2013  . Obstructive sleep apnea 09/26/2005   AHI-19.46, during REM-36.88  . Pacemaker St. Jude victory, dual chamber, 2006 04/24/2013  . Pericardial effusion    2D ECHO, 03/07/2011 - EF >70%, normal  . Second degree heart block 04/24/2013    Family History  family history includes CVA in his mother; Heart disease in his maternal grandmother; Hypertension in his sister.  Prior Rehab/Hospitalizations:  Has the patient had major surgery during 100 days prior to admission? No  Current Medications  Current Facility-Administered Medications:  .  acetaminophen (TYLENOL) tablet 650 mg, 650 mg, Oral, Q4H PRN **OR** acetaminophen (TYLENOL) suppository 650 mg, 650 mg, Rectal, Q4H PRN, Darrel Reach, MD .  amLODipine (NORVASC) tablet 10 mg, 10 mg, Oral, Daily, Rosalin Hawking, MD, 10 mg at 03/24/16 0840 .  cloNIDine (CATAPRES) tablet 0.2 mg, 0.2 mg, Oral, BID, Rosalin Hawking, MD, 0.2 mg at 03/24/16 0841 .  furosemide (LASIX) tablet 40 mg, 40 mg, Oral, Daily, Rosalin Hawking, MD, 40 mg at 03/24/16 0839 .  labetalol (NORMODYNE,TRANDATE) injection 10 mg, 10 mg, Intravenous, Q2H PRN, Darrel Reach, MD .  losartan (COZAAR) tablet 100 mg, 100 mg, Oral, BID,  Rosalin Hawking, MD, 100 mg at 03/24/16 0839 .  nebivolol (BYSTOLIC) tablet 10 mg, 10 mg, Oral, Daily, Rosalin Hawking, MD, 10 mg at 03/24/16 0840 .  pantoprazole (PROTONIX) EC tablet 40 mg, 40 mg, Oral, Daily, Darrel Reach, MD, 40 mg at 03/24/16 0840 .  potassium chloride SA (K-DUR,KLOR-CON) CR tablet 20 mEq, 20 mEq, Oral, BID, Rosalin Hawking, MD, 20 mEq at 03/24/16 0840 .  senna-docusate (Senokot-S) tablet 1 tablet, 1 tablet, Oral, BID, Darrel Reach, MD, 1 tablet at 03/24/16 [id number]  Patients Current Diet: Diet Heart Room service appropriate? Yes; Fluid consistency: Thin  Precautions / Restrictions Precautions Precautions: Fall Restrictions Weight Bearing Restrictions: No   Has the patient had 2 or more falls or a fall with injury in the past year?No  Prior Activity Level Community (5-7x/wk): Independent and driving without AD pta  Home Assistive Devices / Equipment Home Equipment: None  Prior Device Use: Indicate devices/aids used by the patient prior to current illness, exacerbation or injury? None of the above  Prior Functional Level Prior Function Level of Independence: Independent Comments: was at football game prior to admission  Self Care: Did the patient need help bathing, dressing, using the toilet or eating?  Independent  Indoor Mobility: Did the patient need assistance with walking from room to room (with or without device)? Independent  Stairs: Did the patient need assistance with internal or external stairs (with or without device)? Independent  Functional Cognition: Did the patient need help planning regular tasks such as shopping or remembering to take medications? Independent  Current Functional Level Cognition  Overall Cognitive Status: Within Functional Limits for tasks assessed Orientation Level: Oriented X4    Extremity Assessment (includes Sensation/Coordination)  Upper Extremity Assessment: LUE deficits/detail LUE Deficits / Details: AROM WFL.  Strength WFL 4+/5 compared to RUE.  pt with dysmetria and slow with FNF and RAM.   LUE Coordination: decreased fine motor, decreased gross motor  Lower Extremity Assessment: Defer to PT evaluation LLE Deficits / Details: decreased sensation reported LLE LLE Coordination: decreased gross motor (during functional tasks, but in isolation [facility])    ADLs  Overall ADL's : Needs assistance/impaired Eating/Feeding: Independent, Sitting Grooming: Wash/dry hands, Wash/dry face, Oral care, Minimal assistance, Standing Grooming Details (indicate cue type and reason): min assist to cue pt to not lean to the Left.  Pt placed self in front of mirror off to the left as well.  Was able to see my face in mirror on R and L w/o difficulty.  Pt unable to control secretions while brushing but needed cues to lean over sink so they would not run down his clothes.  Pt required a appx 4 sec to realize water still running when he was done but did shut off w/o cues. Upper Body Bathing: Set up, Sitting Upper Body Bathing  Details (indicate cue type and reason): slightly uncoordinated with use of LUE during task. Lower Body Bathing: Minimal assistance, Sit to/from stand, Cueing for compensatory techniques Lower Body Bathing Details (indicate cue type and reason): Pt requires min assist when on his feet to maintain balance. Pt lets go of walker with both hands and loses balance and requires cues to slow down and remember to hold to walker with one hand at all times at this point. Upper Body Dressing : Minimal assistance, Sitting Upper Body Dressing Details (indicate cue type and reason): min assist with fastners.  pt stood to put on gown. pt let go of walker with both hands and had 2 losses of balance requring min assist to recover while putting on gown.  Pt required min cues to fix L side of gown where it had been caught but pt did not realize it. Lower Body Dressing: Minimal assistance, Sit to/from stand Lower Body Dressing  Details (indicate cue type and reason): Pt requires mod assist once on his feet to pull pants up and do fasteners. Toilet Transfer: Moderate assistance, Ambulation, RW, Comfort height toilet, Cueing for safety Toilet Transfer Details (indicate cue type and reason): Pt requires mod assist to control placement of the LLE when walking and coordinate walker use.  See PT notes.  Toileting- Clothing Manipulation and Hygiene: Minimal assistance, Sit to/from stand Toileting - Clothing Manipulation Details (indicate cue type and reason): min assist for balance. Functional mobility during ADLs: Moderate assistance, +2 for safety/equipment, Rolling walker, Cueing for sequencing General ADL Comments: Pt requires mod assist with most adls in standing.  When sitting, pt can do most adls without assist.    Mobility  Overal bed mobility: Needs Assistance Bed Mobility: Supine to Sit Supine to sit: Min assist General bed mobility comments: in chair on arrival.    Transfers  Overall transfer level: Needs assistance Equipment used: 1 person hand held assist Transfers: Sit to/from Stand, Stand Pivot Transfers Sit to Stand: Min assist Stand pivot transfers: Mod assist General transfer comment: min assist to power up to standing, noted use of bed against calves for stability in addition to HHA and gait belt support    Ambulation / Gait / Stairs / Wheelchair Mobility  Ambulation/Gait Ambulation/Gait assistance: Mod assist, Total assist (moderate assist for amb, total assist after 41ft LE buckled) Ambulation Distance (Feet): 16 Feet Assistive device: 1 person hand held assist (wrap around support with therapist providing lateral assist) Gait Pattern/deviations: Step-to pattern, Decreased stride length, Ataxic, Narrow base of support (left lateral lean noted) General Gait Details: patient with left lateral lean noted during ambulation attempt, upon reaching ~16 feet, patient LEs gave way completely and patient  propped upon therapist knee and wall while nurses acquired chair and provided physical assist to elevate patient (+3) off of therapist knee into chair, then patient transfered from chair into recliner with moderate assist of 2 persons (inattention to Left side, multi cues for awarenes of objects) Gait velocity: decreased Gait velocity interpretation: Below normal speed for age/gender    Posture / Balance Balance Overall balance assessment: Needs assistance Sitting-balance support: Feet supported Sitting balance-Leahy Scale: Good Standing balance support: Bilateral upper extremity supported, During functional activity Standing balance-Leahy Scale: Poor Standing balance comment: Pt must have outside assist to stand.  Pt not safe when both hands are not on the walker.    Special needs/care consideration BiPAP/CPAP  N/a CPM n/a Continuous Drip IV  N/a Dialysis  N/a Life Vest  N/a Oxygen  N/a Special Bed  N/a Trach Size  N/a Wound Vac (area)  N/a Skin  intact Bowel mgmt: LBM 10/13 continent Bladder mgmt: continent Diabetic mgmt known DM pta Hgb A1c 6.9   Previous Home Environment Living Arrangements: Spouse/significant other  Lives With: Spouse Available Help at Discharge: Family, Available 24 hours/day Type of Home: House Home Layout: One level Home Access: Stairs to enter [employer] of Steps: 2 Bathroom Shower/Tub: Public librarian, Architectural technologist: Standard Bathroom Accessibility: Yes How Accessible: Accessible via walker [place]: No  Discharge Living Setting Plans for Discharge Living Setting: Patient's home, Lives with (comment) (spouse) Type of Home at Discharge: House Discharge Home Layout: One level Discharge Home Access: Stairs to enter Entrance Stairs-Rails: None Entrance Stairs-Number of Steps: 2 Discharge Bathroom Shower/Tub: Tub/shower unit, Curtain Discharge Bathroom Toilet: Standard Discharge Bathroom Accessibility: Yes How  Accessible: Accessible via walker Does the patient have any problems obtaining your medications?: No  Social/Family/Support Systems Patient Roles: Spouse, Parent (retired Economist) [place]: [name], wife Anticipated Caregiver: wife and son Anticipated Ambulance person Information: see above Ability/Limitations of Caregiver: no limitations. Wife retired Armed forces operational officer Availability: 24/7 Discharge Plan Discussed with Primary Caregiver: Yes Is Caregiver In Agreement with Plan?: Yes Does Caregiver/Family have Issues with Lodging/Transportation while Pt is in Rehab?: No  Goals/Additional Needs Patient/Family Goal for Rehab: Mod I wot supervision with PT and OT Expected length of stay: ELOS 5- 7 days Pt/Family Agrees to Admission and willing to participate: Yes Program Orientation Provided & Reviewed with Pt/Caregiver Including Roles  & Responsibilities: Yes  Decrease burden of Care through IP rehab admission: n/a  Possible need for SNF placement upon discharge:not anticipated  Patient Condition: The patient's medical and functional status remains the same since consult dated 03/23/16. Pt overall min to mod assist functionally with therapy today 10/16. Patient will benefit from an comprehensive acute inpatient rehabilitation admission. We will admit patient today.  Preadmission Screen Completed By:  Cleatrice Burke, 03/24/2016 12:26 PM ______________________________________________________________________   Discussed status with Dr. Naaman Plummer on 03/24/2016 at 1234 and received telephone approval for admission today.  Admission Coordinator:  Cleatrice Burke, time [id number] Date 03/24/2016.

## 2016-03-24 NOTE — Progress Notes (Signed)
Cristina Gong, RN Rehab Admission Coordinator Signed Physical Medicine and Rehabilitation  PMR Pre-admission Date of Service: 03/24/2016 12:26 PM  Related encounter: Admission (Discharged) from 03/22/2016 in [place]       [] Hide copied text PMR Admission Coordinator Pre-Admission Assessment  Patient: Richard Alvarado is an 75 y.o., male MRN: [id number] DOB: [birth year] Height: 5' 9.5" (176.5 cm) Weight: 89.1 kg (196 lb 6.9 oz)                                                                                                                                                  Insurance Information HMO:     PPO:      PCP:      IPA:      80/20: yes     OTHER:  No HMO PRIMARY: Medicare a and b      Policy#: [id number]      Subscriber: pt Benefits:  Phone #: passport one online Name: 03/24/16 Eff. Date: 06/10/95     Deduct: $1316      Out of Pocket Max: none      Life Max: none CIR: 100%      SNF: 20 full days Outpatient: 80%     Co-Pay: 20% Home Health: 100%      Co-Pay: none DME: 80%     Co-Pay: 20% Providers: pt choice  SECONDARY: none       Medicaid Application Date:       Case Manager:  Disability Application Date:       Case Worker:   Emergency Contact Information        Contact Information    Name Relation Home Work Mobile   [place] Spouse [phone]  [phone]   [place], [place]   [phone]   Geryl Rankins   [phone]     Current Medical History  Patient Admitting Diagnosis: right basal ganglia hemorrhage  History of Present Illness: HPI: Richard Alvarado is a 75 year old male with history of HTN, OSA, PPM, prior CVA 2014; who was admitted on 03/21/16 with development of BLE weakness and difficulty standing/walking while at football game. UDS negative. He was taken to [facility] where CT head done revealing small acute R-BG hemorrhage and elevated BP. He was started on cardene drip and transferred  to [facility] for treatment/evalution. CTA head/neck done revealing stable acute punctate right posterior BG hemorrhage, stable advanced microvascular disease, mild 20% stenosis proximal ICA, likely moderate stenosis L-VA and intracranial atherosclerosis. 2D echo done revealing EF 60-65% with no wall abnormality and mild calcification of MV.  Patient with resultant mild left facial weakness, left pronator drift, decrease in [facility] LLL and mild left LE weakness. PT evaluation done this weekend revealing left lateral lean, BLE weakness and giveaway with limited mobility and left inattention.    Total: 3 NIH  Past Medical History      Past Medical History:  Diagnosis Date  . Cerebral atherosclerosis    CAROTID DOPPLER, 09/07/2009 - RIGHT AND LEFT CCAs-small-moderate amount of irregular mixed density plaque, no significant evidence of diameter reduction; RIGHT AND LEFT ICAs-moderate amount irregular mixed density plaque, no evidence of significant diameter reduction  . CVA (cerebral vascular accident) ([place]) 1994   Left brain  . History of CVA (cerebrovascular accident) 04/24/2013  . Hypertension    RENAL DOPPLER, 03/08/2009 - normal  . Hypertension in pregnancy, preeclampsia, severe 04/24/2013  . Obstructive sleep apnea 09/26/2005   AHI-19.46, during REM-36.88  . Pacemaker St. Jude victory, dual chamber, 2006 04/24/2013  . Pericardial effusion    2D ECHO, 03/07/2011 - EF >70%, normal  . Second degree heart block 04/24/2013    Family History  family history includes CVA in his mother; Heart disease in his maternal grandmother; Hypertension in his sister.  Prior Rehab/Hospitalizations:  Has the patient had major surgery during 100 days prior to admission? No  Current Medications   Current Facility-Administered Medications:  .  acetaminophen (TYLENOL) tablet 650 mg, 650 mg, Oral, Q4H PRN **OR** acetaminophen (TYLENOL) suppository 650 mg, 650 mg, Rectal, Q4H PRN, Darrel Reach,  MD .  amLODipine (NORVASC) tablet 10 mg, 10 mg, Oral, Daily, Rosalin Hawking, MD, 10 mg at 03/24/16 0840 .  cloNIDine (CATAPRES) tablet 0.2 mg, 0.2 mg, Oral, BID, Rosalin Hawking, MD, 0.2 mg at 03/24/16 0841 .  furosemide (LASIX) tablet 40 mg, 40 mg, Oral, Daily, Rosalin Hawking, MD, 40 mg at 03/24/16 0839 .  labetalol (NORMODYNE,TRANDATE) injection 10 mg, 10 mg, Intravenous, Q2H PRN, Darrel Reach, MD .  losartan (COZAAR) tablet 100 mg, 100 mg, Oral, BID, Rosalin Hawking, MD, 100 mg at 03/24/16 0839 .  nebivolol (BYSTOLIC) tablet 10 mg, 10 mg, Oral, Daily, Rosalin Hawking, MD, 10 mg at 03/24/16 0840 .  pantoprazole (PROTONIX) EC tablet 40 mg, 40 mg, Oral, Daily, Darrel Reach, MD, 40 mg at 03/24/16 0840 .  potassium chloride SA (K-DUR,KLOR-CON) CR tablet 20 mEq, 20 mEq, Oral, BID, Rosalin Hawking, MD, 20 mEq at 03/24/16 0840 .  senna-docusate (Senokot-S) tablet 1 tablet, 1 tablet, Oral, BID, Darrel Reach, MD, 1 tablet at 03/24/16 [id number]  Patients Current Diet: Diet Heart Room service appropriate? Yes; Fluid consistency: Thin  Precautions / Restrictions Precautions Precautions: Fall Restrictions Weight Bearing Restrictions: No   Has the patient had 2 or more falls or a fall with injury in the past year?No  Prior Activity Level Community (5-7x/wk): Independent and driving without AD pta  Home Assistive Devices / Equipment Home Equipment: None  Prior Device Use: Indicate devices/aids used by the patient prior to current illness, exacerbation or injury? None of the above  Prior Functional Level Prior Function Level of Independence: Independent Comments: was at football game prior to admission  Self Care: Did the patient need help bathing, dressing, using the toilet or eating?  Independent  Indoor Mobility: Did the patient need assistance with walking from room to room (with or without device)? Independent  Stairs: Did the patient need assistance with internal or external stairs (with  or without device)? Independent  Functional Cognition: Did the patient need help planning regular tasks such as shopping or remembering to take medications? Independent  Current Functional Level Cognition Overall Cognitive Status: Within Functional Limits for tasks assessed Orientation Level: Oriented X4    Extremity Assessment (includes Sensation/Coordination) Upper Extremity Assessment: LUE deficits/detail LUE Deficits / Details:  AROM WFL. Strength WFL 4+/5 compared to RUE.  pt with dysmetria and slow with FNF and RAM.   LUE Coordination: decreased fine motor, decreased gross motor  Lower Extremity Assessment: Defer to PT evaluation LLE Deficits / Details: decreased sensation reported LLE LLE Coordination: decreased gross motor (during functional tasks, but in isolation [facility])   ADLs Overall ADL's : Needs assistance/impaired Eating/Feeding: Independent, Sitting Grooming: Wash/dry hands, Wash/dry face, Oral care, Minimal assistance, Standing Grooming Details (indicate cue type and reason): min assist to cue pt to not lean to the Left.  Pt placed self in front of mirror off to the left as well.  Was able to see my face in mirror on R and L w/o difficulty.  Pt unable to control secretions while brushing but needed cues to lean over sink so they would not run down his clothes.  Pt required a appx 4 sec to realize water still running when he was done but did shut off w/o cues. Upper Body Bathing: Set up, Sitting Upper Body Bathing Details (indicate cue type and reason): slightly uncoordinated with use of LUE during task. Lower Body Bathing: Minimal assistance, Sit to/from stand, Cueing for compensatory techniques Lower Body Bathing Details (indicate cue type and reason): Pt requires min assist when on his feet to maintain balance. Pt lets go of walker with both hands and loses balance and requires cues to slow down and remember to hold to walker with one hand at all times at this point. Upper  Body Dressing : Minimal assistance, Sitting Upper Body Dressing Details (indicate cue type and reason): min assist with fastners.  pt stood to put on gown. pt let go of walker with both hands and had 2 losses of balance requring min assist to recover while putting on gown.  Pt required min cues to fix L side of gown where it had been caught but pt did not realize it. Lower Body Dressing: Minimal assistance, Sit to/from stand Lower Body Dressing Details (indicate cue type and reason): Pt requires mod assist once on his feet to pull pants up and do fasteners. Toilet Transfer: Moderate assistance, Ambulation, RW, Comfort height toilet, Cueing for safety Toilet Transfer Details (indicate cue type and reason): Pt requires mod assist to control placement of the LLE when walking and coordinate walker use.  See PT notes.  Toileting- Clothing Manipulation and Hygiene: Minimal assistance, Sit to/from stand Toileting - Clothing Manipulation Details (indicate cue type and reason): min assist for balance. Functional mobility during ADLs: Moderate assistance, +2 for safety/equipment, Rolling walker, Cueing for sequencing General ADL Comments: Pt requires mod assist with most adls in standing.  When sitting, pt can do most adls without assist.   Mobility Overal bed mobility: Needs Assistance Bed Mobility: Supine to Sit Supine to sit: Min assist General bed mobility comments: in chair on arrival.   Transfers Overall transfer level: Needs assistance Equipment used: 1 person hand held assist Transfers: Sit to/from Stand, Stand Pivot Transfers Sit to Stand: Min assist Stand pivot transfers: Mod assist General transfer comment: min assist to power up to standing, noted use of bed against calves for stability in addition to HHA and gait belt support   Ambulation / Gait / Stairs / Wheelchair Mobility Ambulation/Gait Ambulation/Gait assistance: Mod assist, Total assist (moderate assist for amb, total assist after 12ft  LE buckled) Ambulation Distance (Feet): 16 Feet Assistive device: 1 person hand held assist (wrap around support with therapist providing lateral assist) Gait Pattern/deviations: Step-to pattern, Decreased stride  length, Ataxic, Narrow base of support (left lateral lean noted) General Gait Details: patient with left lateral lean noted during ambulation attempt, upon reaching ~16 feet, patient LEs gave way completely and patient propped upon therapist knee and wall while nurses acquired chair and provided physical assist to elevate patient (+3) off of therapist knee into chair, then patient transfered from chair into recliner with moderate assist of 2 persons (inattention to Left side, multi cues for awarenes of objects) Gait velocity: decreased Gait velocity interpretation: Below normal speed for age/gender   Posture / Balance Balance Overall balance assessment: Needs assistance Sitting-balance support: Feet supported Sitting balance-Leahy Scale: Good Standing balance support: Bilateral upper extremity supported, During functional activity Standing balance-Leahy Scale: Poor Standing balance comment: Pt must have outside assist to stand.  Pt not safe when both hands are not on the walker.   Special needs/care consideration BiPAP/CPAP  N/a CPM n/a Continuous Drip IV  N/a Dialysis  N/a Life Vest  N/a Oxygen  N/a Special Bed  N/a Trach Size  N/a Wound Vac (area)  N/a Skin  intact Bowel mgmt: LBM 10/13 continent Bladder mgmt: continent Diabetic mgmt known DM pta Hgb A1c 6.9   Previous Home Environment Living Arrangements: Spouse/significant other  Lives With: Spouse Available Help at Discharge: Family, Available 24 hours/day Type of Home: House Home Layout: One level Home Access: Stairs to enter [employer] of Steps: 2 Bathroom Shower/Tub: Tub/shower unit, Architectural technologist: Standard Bathroom Accessibility: Yes How Accessible: Accessible via walker [place]: No  Discharge Living Setting Plans for Discharge Living Setting: Patient's home, Lives with (comment) (spouse) Type of Home at Discharge: House Discharge Home Layout: One level Discharge Home Access: Stairs to enter Entrance Stairs-Rails: None Entrance Stairs-Number of Steps: 2 Discharge Bathroom Shower/Tub: Tub/shower unit, Curtain Discharge Bathroom Toilet: Standard Discharge Bathroom Accessibility: Yes How Accessible: Accessible via walker Does the patient have any problems obtaining your medications?: No  Social/Family/Support Systems Patient Roles: Spouse, 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 Wife retired Armed forces operational officer Availability: 24/7 Discharge Plan Discussed with Primary Caregiver: Yes Is Caregiver In Agreement with Plan?: Yes Does Caregiver/Family have Issues with Lodging/Transportation while Pt is in Rehab?: No  Goals/Additional Needs Patient/Family Goal for Rehab: Mod I wot supervision with PT and OT Expected length of stay: ELOS 5- 7 days Pt/Family Agrees to Admission and willing to participate: Yes Program Orientation Provided & Reviewed with Pt/Caregiver Including Roles  & Responsibilities: Yes  Decrease burden of Care through IP rehab admission: n/a  Possible need for SNF placement upon discharge:not anticipated  Patient Condition: The patient's medical and functional status remains the same since consult dated 03/23/16. Pt overall min to mod assist functionally with therapy today 10/16. Patient will benefit from an comprehensive acute inpatient rehabilitation admission. We will admit patient today.  Preadmission Screen Completed By:  Cleatrice Burke, 03/24/2016 12:26 PM ______________________________________________________________________   Discussed status with Dr.  Naaman Plummer on 03/24/2016 at 1234 and received telephone approval for admission today.  Admission Coordinator:  Cleatrice Burke, time [id number] Date 03/24/2016.       Cosigned by: Meredith Staggers, MD at 03/24/2016 12:37 PM  Revision History

## 2016-03-24 NOTE — Progress Notes (Signed)
I met with pt and his son at bedside to discuss an inpt rehab admission after working with PT and OT this morning. Conference all with wife also. All in agreement to admission today. I have alerted Burnetta Sabin, GNP, RN CM and SW. I will make the arrangements to admit today. [phone]

## 2016-03-24 NOTE — Care Management Note (Signed)
Case Management Note  Patient Details  Name: Richard Alvarado MRN: [id number] Date of Birth: April 01, 1941  Subjective/Objective:  Pt admitted with [place]. He is from home with his spouse.                   Action/Plan: Pt discharging to CIR today. No further needs per CM.  Expected Discharge Date:                  Expected Discharge Plan:     In-House Referral:     Discharge planning Services     Post Acute Care Choice:    Choice offered to:     DME Arranged:    DME Agency:     HH Arranged:    HH Agency:     Status of Service:     If discussed at [employer], dates discussed:    Additional Comments:  Pollie Friar, RN 03/24/2016, 12:25 PM

## 2016-03-24 NOTE — Interval H&P Note (Signed)
Richard Alvarado was admitted today to Inpatient Rehabilitation with the diagnosis of right basal ganglia hemorrhage.  The patient's history has been reviewed, patient examined, and there is no change in status.  Patient continues to be appropriate for intensive inpatient rehabilitation.  I have reviewed the patient's chart and labs.  Questions were answered to the patient's satisfaction. The PAPE has been reviewed and assessment remains appropriate.  Kevante Lunt T 03/24/2016, 3:54 PM

## 2016-03-24 NOTE — Progress Notes (Signed)
Physical Therapy Treatment Patient Details Name: Richard Alvarado MRN: [id number] DOB: Aug 01, 1940 Today's Date: 03/24/2016    History of Present Illness 75 y.o.malewith history of second-degree heart block, permanent pacemaker, obstructive sleep apnea, hypertension, previous stroke 2014, and carotid artery diseasepresenting with lower extremity numbnessand elevated blood pressure. Imaging revealed + Stroke: Right BG small ICH and questionable left BG tiny ICH,secondary to hypertension    PT Comments    Pt progressing well but remains to have impaired co-ordination on the L and remains at a significant falls risk due to impaired balance and L sided weakness. Con't to recommend CIR upon d/c as pt is very motivated and demo's excellent rehab potential.  Follow Up Recommendations  CIR;Supervision/Assistance - 24 hour     Equipment Recommendations   (tbd)    Recommendations for Other Services Rehab consult     Precautions / Restrictions Precautions Precautions: Fall Restrictions Weight Bearing Restrictions: No    Mobility  Bed Mobility               General bed mobility comments: pt in chair upon PT arrival  Transfers Overall transfer level: Needs assistance Equipment used: Rolling walker (2 wheeled) Transfers: Sit to/from Stand Sit to Stand: Min assist Stand pivot transfers: Mod assist       General transfer comment: v/c's for hand placement, increased time, + lateral sway during hand transition from chair to walker  Ambulation/Gait Ambulation/Gait assistance: Mod assist;+2 safety/equipment;+2 physical assistance Ambulation Distance (Feet): 25 Feet Assistive device: Rolling walker (2 wheeled) Gait Pattern/deviations: Step-through pattern;Decreased step length - left;Decreased stance time - left;Ataxic;Narrow base of support Gait velocity: dec Gait velocity interpretation: Below normal speed for age/gender General Gait Details: pt required tactile cues at  hips for optimal weight shifting and tactile cues from PT at L knee and ankle for optimal kinematics due to hyperextension at knee and decreased proprioception and impaired coordination. pt with L ankle supination as well   Stairs            Wheelchair Mobility    Modified Rankin (Stroke Patients Only) Modified Rankin (Stroke Patients Only) Pre-Morbid Rankin Score: No symptoms Modified Rankin: Moderately severe disability     Balance Overall balance assessment: Needs assistance Sitting-balance support: Feet supported Sitting balance-Leahy Scale: Good     Standing balance support: Bilateral upper extremity supported Standing balance-Leahy Scale: Poor Standing balance comment: Pt must have outside assist to stand.  Pt not safe when both hands are not on the walker.                    Cognition Arousal/Alertness: Awake/alert Behavior During Therapy: WFL for tasks assessed/performed Overall Cognitive Status: Within Functional Limits for tasks assessed                      Exercises Other Exercises Other Exercises: worked on stepping with L foot and improving coordination via slowing picking L foot up and placing it in the same spot on the floor (used an X on the floor)    General Comments        Pertinent Vitals/Pain Pain Assessment: No/denies pain    Home Living Family/patient expects to be discharged to:: Private residence Living Arrangements: Spouse/significant other Available Help at Discharge: Family;Available 24 hours/day Type of Home: House Home Access: Stairs to enter   Home Layout: One level Home Equipment: None      Prior Function Level of Independence: Independent      Comments: was  at football game prior to admission   PT Goals (current goals can now be found in the care plan section) Acute Rehab PT Goals Patient Stated Goal: to get better Progress towards PT goals: Progressing toward goals    Frequency    Min  4X/week      PT Plan Current plan remains appropriate    Co-evaluation PT/OT/SLP Co-Evaluation/Treatment: Yes Reason for Co-Treatment: Complexity of the patient's impairments (multi-system involvement) PT goals addressed during session: Mobility/safety with mobility       End of Session Equipment Utilized During Treatment: Gait belt Activity Tolerance: Patient limited by fatigue;Other (comment) Patient left: in chair;with call bell/phone within reach;with chair alarm set     Time: [id number] PT Time Calculation (min) (ACUTE ONLY): 30 min  Charges:  $Gait Training: 8-22 mins                    G Codes:      Richard Alvarado 03/24/2016, 2:55 PM  Richard Alvarado, PT, DPT Pager #: [phone] Office #: [phone]

## 2016-03-24 NOTE — Progress Notes (Signed)
Pt being transferred to inpatient rehab per orders from MD. Pt made aware of transfer. RN gave report via phone to RN receiving pt in [phone]. Pt transferred to unit via wheelchair.

## 2016-03-24 NOTE — Progress Notes (Signed)
Patient ID: Richard Alvarado, male   DOB: Aug 09, 1940, 75 y.o.   MRN: [id number] Patient admitted to [phone] via bed, escorted by nursing staff and family.  Patient and family verbalized understanding of rehab process, signed fall safety agreement.  Patient appears to be in no immediate distress at this time.  Brita Romp, RN

## 2016-03-25 ENCOUNTER — Encounter (HOSPITAL_COMMUNITY): Payer: Self-pay

## 2016-03-25 ENCOUNTER — Inpatient Hospital Stay (HOSPITAL_COMMUNITY): Payer: Medicare Other | Admitting: Physical Therapy

## 2016-03-25 ENCOUNTER — Inpatient Hospital Stay (HOSPITAL_COMMUNITY): Payer: Medicare Other | Admitting: Occupational Therapy

## 2016-03-25 LAB — COMPREHENSIVE METABOLIC PANEL
ALT: 16 U/L — ABNORMAL LOW (ref 17–63)
AST: 23 U/L (ref 15–41)
Albumin: 3.5 g/dL (ref 3.5–5.0)
Alkaline Phosphatase: 68 U/L (ref 38–126)
Anion gap: 9 (ref 5–15)
BUN: 11 mg/dL (ref 6–20)
CO2: 21 mmol/L — ABNORMAL LOW (ref 22–32)
Calcium: 9.2 mg/dL (ref 8.9–10.3)
Chloride: 108 mmol/L (ref 101–111)
Creatinine, Ser: 0.99 mg/dL (ref 0.61–1.24)
GFR calc Af Amer: >60 mL/min (ref >60)
GFR calc non Af Amer: >60 mL/min (ref >60)
Glucose, Bld: 119 mg/dL — ABNORMAL HIGH (ref 65–99)
Potassium: 3.7 mmol/L (ref 3.5–5.1)
Sodium: 138 mmol/L (ref 135–145)
Total Bilirubin: 0.8 mg/dL (ref 0.3–1.2)
Total Protein: 7.7 g/dL (ref 6.5–8.1)

## 2016-03-25 LAB — CBC WITH DIFFERENTIAL/PLATELET
Basophils Absolute: 0 10*3/uL (ref 0.0–0.1)
Basophils Relative: 0 %
Eosinophils Absolute: 0.2 10*3/uL (ref 0.0–0.7)
Eosinophils Relative: 4 %
HCT: 41.3 % (ref 39.0–52.0)
Hemoglobin: 13.4 g/dL (ref 13.0–17.0)
Lymphocytes Relative: 16 %
Lymphs Abs: 0.9 10*3/uL (ref 0.7–4.0)
MCH: 24.4 pg — ABNORMAL LOW (ref 26.0–34.0)
MCHC: 32.4 g/dL (ref 30.0–36.0)
MCV: 75.2 fL — ABNORMAL LOW (ref 78.0–100.0)
Monocytes Absolute: 0.6 10*3/uL (ref 0.1–1.0)
Monocytes Relative: 11 %
Neutro Abs: 4 10*3/uL (ref 1.7–7.7)
Neutrophils Relative %: 69 %
Platelets: 216 10*3/uL (ref 150–400)
RBC: 5.49 MIL/uL (ref 4.22–5.81)
RDW: 14.2 % (ref 11.5–15.5)
WBC: 5.7 10*3/uL (ref 4.0–10.5)

## 2016-03-25 LAB — GLUCOSE, CAPILLARY
Glucose-Capillary: 85 mg/dL (ref 65–99)
Glucose-Capillary: 119 mg/dL — ABNORMAL HIGH (ref 65–99)

## 2016-03-25 MED ORDER — CLONIDINE HCL 0.1 MG PO TABS
0.1000 mg | ORAL_TABLET | Freq: Four times a day (QID) | ORAL | Status: DC | PRN
  Filled 2016-03-25: qty 1

## 2016-03-25 MED ORDER — NITROGLYCERIN 2 % TD OINT
0.5000 [in_us] | TOPICAL_OINTMENT | Freq: Four times a day (QID) | TRANSDERMAL | Status: DC | PRN
  Filled 2016-03-25: qty 30

## 2016-03-25 NOTE — Progress Notes (Addendum)
75 year old male with history of HTN, OSA,  PPM, prior CVA 2014;  who was admitted on 03/21/16 with development of BLE weakness and difficulty standing/walking while at football game. UDS negative.  He was taken to [facility] where CT head done revealing small acute R-BG hemorrhage and elevated BP. He was started on cardene drip and transferred to [facility] for treatment/evalution. CTA head/neck done revealing stable acute punctate right posterior BG hemorrhage, stable advanced microvascular disease, mild 20% stenosis proximal ICA, likely moderate stenosis L-VA and intracranial atherosclerosis.  2D echo done revealing EF 60-65% with no wall abnormality and mild calcification of MV.  Patient with resultant mild left facial weakness, left pronator drift, decrease in [facility] and mild left LE weakness.  Subjective/Complaints: No Issues overnite  ROS:  No CP. SOB,N/V/D  Objective: Vital Signs: Blood pressure (!) 183/77, pulse 61, temperature 98.4 F (36.9 C), temperature source Oral, resp. rate 18, height 5' 10"  (1.778 m), weight 91.6 kg (202 lb), SpO2 96 %. No results found. Results for orders placed or performed during the hospital encounter of 03/24/16 (from the past 72 hour(s))  Glucose, capillary     Status: Abnormal   Collection Time: 03/24/16  4:41 PM  Result Value Ref Range   Glucose-Capillary 115 (H) 65 - 99 mg/dL  CBC with Differential/Platelet     Status: Abnormal   Collection Time: 03/25/16  6:39 AM  Result Value Ref Range   WBC 5.7 4.0 - 10.5 K/uL   RBC 5.49 4.22 - 5.81 MIL/uL   Hemoglobin 13.4 13.0 - 17.0 g/dL   HCT 41.3 39.0 - 52.0 %   MCV 75.2 (L) 78.0 - 100.0 fL   MCH 24.4 (L) 26.0 - 34.0 pg   MCHC 32.4 30.0 - 36.0 g/dL   RDW 14.2 11.5 - 15.5 %   Platelets 216 150 - 400 K/uL   Neutrophils Relative % 69 %   Neutro Abs 4.0 1.7 - 7.7 K/uL   Lymphocytes Relative 16 %   Lymphs Abs 0.9 0.7 - 4.0 K/uL   Monocytes Relative 11 %   Monocytes Absolute 0.6 0.1 - 1.0 K/uL   Eosinophils  Relative 4 %   Eosinophils Absolute 0.2 0.0 - 0.7 K/uL   Basophils Relative 0 %   Basophils Absolute 0.0 0.0 - 0.1 K/uL  Comprehensive metabolic panel     Status: Abnormal   Collection Time: 03/25/16  6:39 AM  Result Value Ref Range   Sodium 138 135 - 145 mmol/L   Potassium 3.7 3.5 - 5.1 mmol/L   Chloride 108 101 - 111 mmol/L   CO2 21 (L) 22 - 32 mmol/L   Glucose, Bld 119 (H) 65 - 99 mg/dL   BUN 11 6 - 20 mg/dL   Creatinine, Ser 0.99 0.61 - 1.24 mg/dL   Calcium 9.2 8.9 - 10.3 mg/dL   Total Protein 7.7 6.5 - 8.1 g/dL   Albumin 3.5 3.5 - 5.0 g/dL   AST 23 15 - 41 U/L   ALT 16 (L) 17 - 63 U/L   Alkaline Phosphatase 68 38 - 126 U/L   Total Bilirubin 0.8 0.3 - 1.2 mg/dL   GFR calc non Af Amer >60 >60 mL/min   GFR calc Af Amer >60 >60 mL/min    Comment: (NOTE) The eGFR has been calculated using the CKD EPI equation. This calculation has not been validated in all clinical situations. eGFR's persistently <60 mL/min signify possible Chronic Kidney Disease.    Anion gap 9 5 -  15     HEENT: normal Cardio: RRR and no murmur Resp: RRR and no murmur GI: BS positive and NT, ND Extremity:  Pulses positive and No Edema Skin:   Intact Neuro: Alert/Oriented, Normal Sensory, Abnormal Motor 4/5 Left Delt bi, tri, grip, HF, KE, ADF and Abnormal FMC Ataxic/ dec FMC Musc/Skel:  Other no pain with AAROM Gen NAD   Assessment/Plan: 1. Functional deficits secondary to Right basal ganglia ICH which require 3+ hours per day of interdisciplinary therapy in a comprehensive inpatient rehab setting. Physiatrist is providing close team supervision and 24 hour management of active medical problems listed below. Physiatrist and rehab team continue to assess barriers to discharge/monitor patient progress toward functional and medical goals. FIM:                                 Medical Problem List and Plan: 1.  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  DVT Prophylaxis/Anticoagulation: Mechanical: Sequential compression devices, below knee Bilateral lower extremities-start lovenox in am.  3. Pain Management:  N/A 4. Mood: LCSW to follow for evaluation and support.  5. Neuropsych: This patient is capable of making decisions on his own behalf. 6. Skin/Wound Care: Routine pressure relief measures 7. Fluids/Electrolytes/Nutrition: Monitor I/O. BUN/Cr nl 8.HTN: monitor BP tid. Continue Norvasc, catapress, lasix, bystolic and losartan.Add prn clonidine for sys BP>160 Vitals:   03/25/16 0507 03/25/16 0628  BP: (!) 210/90 (!) 183/77  Pulse:  61  Resp:    Temp:     9.T2DM: Monitor BS ac/hs. Appetite has been good. Continue metformin and glipizide.  CBG (last 3)   Recent Labs  03/22/16 2118 03/23/16 0805 03/24/16 1641  GLUCAP 157* 125* 115*    10 Dyslipidemia: Resume at discharge.   Labwork reviewed looks normal   LOS (Days) 1 A FACE TO FACE EVALUATION WAS PERFORMED  Richard Alvarado,Richard Alvarado 03/25/2016, 8:01 AM

## 2016-03-25 NOTE — Progress Notes (Signed)
Patient information reviewed and entered into eRehab system by Megumi Treaster, RN, CRRN, PPS Coordinator.  Information including medical coding and functional independence measure will be reviewed and updated through discharge.     Per nursing patient was given "Data Collection Information Summary for Patients in Inpatient Rehabilitation Facilities with attached "Privacy Act Statement-Health Care Records" upon admission.  

## 2016-03-25 NOTE — Progress Notes (Signed)
Social Work Assessment and Plan Social Work Assessment and Plan  Patient Details  Name: Richard Alvarado MRN: [id number] Date of Birth: Mar 05, 1941  Today's Date: 03/25/2016  Problem List:  Patient Active Problem List   Diagnosis Date Noted  . Family history of stroke 03/24/2016  . OSA (obstructive sleep apnea) 03/24/2016  . Hypokalemia 03/24/2016  . Nontraumatic acute hemorrhage of basal ganglia ([place]) 03/24/2016  . Ataxia   . ICH (intracerebral hemorrhage) ([place]) - R basal ganglia 03/22/2016  . Hypertensive emergency   . Diabetes mellitus type 2 in nonobese ([place]) 03/19/2016  . Pacemaker battery depletion 11/14/2014  . Hyperlipidemia 11/01/2013  . Severe hypertension 04/24/2013  . Pericardial effusion secondary to minoxidil 04/24/2013  . History of CVA (cerebrovascular accident) 04/24/2013  . Second degree heart block 04/24/2013  . Pacemaker St. Jude victory, dual chamber, 2006 04/24/2013  . Cervicalgia 01/01/2011  . Muscle weakness (generalized) 01/01/2011   Past Medical History:  Past Medical History:  Diagnosis Date  . Cerebral atherosclerosis    CAROTID DOPPLER, 09/07/2009 - RIGHT AND LEFT CCAs-small-moderate amount of irregular mixed density plaque, no significant evidence of diameter reduction; RIGHT AND LEFT ICAs-moderate amount irregular mixed density plaque, no evidence of significant diameter reduction  . CVA (cerebral vascular accident) ([place]) 1994   Left brain  . History of CVA (cerebrovascular accident) 04/24/2013  . Hypertension    RENAL DOPPLER, 03/08/2009 - normal  . Hypertension in pregnancy, preeclampsia, severe 04/24/2013  . Obstructive sleep apnea 09/26/2005   AHI-19.46, during REM-36.88  . Pacemaker St. Jude victory, dual chamber, 2006 04/24/2013  . Pericardial effusion    2D ECHO, 03/07/2011 - EF >70%, normal  . Second degree heart block 04/24/2013   Past Surgical History:  Past Surgical History:  Procedure Laterality Date  . EP IMPLANTABLE DEVICE N/A  11/21/2014   Procedure: PPM/BIV PPM Generator Changeout;  Surgeon: [name], MD;  Location: [place] [facility];  Service: Cardiovascular;  Laterality: N/A;  . NM MYOVIEW LTD  03/31/2012   Normal study, no ECG changes, EKG negative for ischemia, post-stress EF 54%  . PACEMAKER INSERTION  05/09/2005   St. Jude [place], [place] #[id number], serial [id number]   Social History:  reports that he quit smoking about 16 years ago. He has never used smokeless tobacco. He reports that he does not drink alcohol or use drugs.  Family / Support Systems Marital Status: Married Patient Roles: Spouse, Parent Spouse/Significant Other: [name] [phone]  [id number] Children: [place]-son  [phone] Other Supports: Geryl Rankins [id number] Anticipated Caregiver: Areatha Keas and son Ability/Limitations of Caregiver: Wife is a retired Therapist, sports and in good health, son to assist also Caregiver Availability: 24/7 Family Dynamics: Close knit family who rely upon one another. Pt is doing well and even surprising himself in therapies today. He really wants to be able to take care of himself before he leaves here.He has good supports via family and friends.  Social History Preferred language: English Religion: Patient Refused Cultural Background: No issues Education: Interior and spatial designer Read: Yes Write: Yes Employment Status: Retired Date Retired/Disabled/Unemployed: retired Manufacturing engineer Issues: No issues Guardian/Conservator: None-according to MD pt is capable of making his own decisions while here   Abuse/Neglect Physical Abuse: Denies Verbal Abuse: Denies Sexual Abuse: Denies Exploitation of patient/patient's resources: Denies Self-Neglect: Denies  Emotional Status Pt's affect, behavior adn adjustment status: Pt is motivated to improve and recover from this stroke. He reports his day is going better than he thought and therapist's was surprised he could  do the things he could  do. He has always been independent and wants to achieve this level again. Recent Psychosocial Issues: other health issues were managed by PCP-has had mini strokes in the past with no residual deficits Pyschiatric History: No history deferred depression screen due to seems to be coping appropriately at this time and adjusting to the rehab program. Will monitor while here and see if would benefit from seeing neuro-psych while here. Substance Abuse History: No issues  Patient / Family Perceptions, Expectations & Goals Pt/Family understanding of illness & functional limitations: Pt talks with the MD and feels his questions and concerns are being addressed. He feels he has a good understanding of his deficits and treatment plan. His wife is a retired Therapist, sports and usually in [place]. Premorbid pt/family roles/activities: husband, father, grandfather, retiree, church member, Psychologist, occupational, Social research officer, government Anticipated changes in roles/activities/participation: resume Pt/family expectations/goals: Pt states: " I want to do well here and recover and be independent again."  Wife states: " I am hopeful he will do better than he thinks and get mobile again."  [employer]: None Premorbid Home Care/DME Agencies: None Transportation available at discharge: Family Resource referrals recommended: Support group (specify)  Discharge Planning Living Arrangements: Spouse/significant other Support Systems: Spouse/significant other, Children, Water engineer, Other relatives, Church/faith community Type of Residence: Private residence Insurance Resources: Chartered certified accountant Resources: Fish farm manager, Other (Comment) (pension) Financial Screen Referred: No Living Expenses: Own Money Management: Spouse, Patient Does the patient have any problems obtaining your medications?: No Home Management: Wife Patient/Family Preliminary Plans: Return home with wife being his main caregiver if  necessary. She is a retired Therapist, sports and aware of his treatment plan. Their son is also involved and will assist if necessary. All are hopeful he will do well here and recover from this stroke. Social Work Anticipated Follow Up Needs: HH/OP, Support Group  Clinical Impression Pleasant gentleman who is motivated and willing to put for the effort to recover from this stroke. Supportive wife and son who are willing to assist. Will await team's evaluations and work on a safe discharge plan. May benefit from neuro-psych await team's input.   Elease Hashimoto 03/25/2016, 1:30 PM

## 2016-03-25 NOTE — Progress Notes (Signed)
Occupational Therapy Session Note  Patient Details  Name: Richard Alvarado MRN: [id number] Date of Birth: 1940/09/15  Today's Date: 03/25/2016 OT Individual Time: 1445-1530 OT Individual Time Calculation (min): 45 min    Skilled Therapeutic Interventions/Progress Updates:    1:1 OT session focused on dynamic standing balance, LE strength/coordination, and sit<>stands. Pt ambulated short distance w/ RW in room  W/ Min A, and 1 posterior near LOB requiring mod A to correct. Pt propelled w/c to therapy gym with questioning cues to attend to objects on L side. Dynamic standing activities completed with Min/Mod A to maintain standing balance when reaching outside base of support.  Forward and medial weight shifting faciltated with sit<>stands. Pt L LE strength and coordination deficits, but improved strength/coordination with L UE . Pt returned to room via w/c at end of session and left with needs met and spouse present.  Therapy Documentation Precautions:  Precautions Precautions: Fall Restrictions Weight Bearing Restrictions: No Pain: Pain Assessment Pain Assessment: No/denies pain Pain Score: 0-No pain  See Function Navigator for Current Functional Status.   Therapy/Group: Individual Therapy  Valma Cava 03/25/2016, 3:43 PM

## 2016-03-25 NOTE — Progress Notes (Signed)
2147: Patients BP elevated again at 194/98. Patients sons in room and concerned, stating the Neurologist told them for his pressure to stay less than 160/90. Family asked what we were doing when it became elevated. Explained that he would get Clonidine 0.1 mg PRN for SBP<160. They wanted to know if his cardiologist or any cardiologist is assisting in managing his BP and if not then could one come. 2215: Called provider on call and let Reesa Chew, PA know their concerns. They also said they felt the Norvasc was what was bringing the BP down at home. They thought it would be better to get it twice a day instead of once. Ms. Erling Cruz stated that Norvasc 10 mg is the max amount he can get a day. Made family aware of that. Reesa Chew stated if the family wanted the patient to have the PRN dose of Clonidine now instead after waiting the full hour he could have it.  2225: Went into patients room and obtained his BP before administering the Clonidine. BP 179/76, HR 60, which was a good start at decreasing almost 20 points in 40 minutes. Asked patient and family if they wanted him to get it or wait another 20 minutes and recheck. They stated to wait and recheck. 2247: Reassessed BP 168/94, HR 62. Again asked if they wanted him to have the Clonidine or to continue and wait and let the medication work. They all agreed to wait for now.  Research officer, trade union spoke with the patient about how much fluids does he drink in a day on average and he said quit a bit. Nurse suggested to  patient to cut back on the fluids a little and see if that makes a difference. Asked patient how much did he drink today and he said significantly less. Patient stated that when he is at home he drinks 10-12 bottles of water a day as well as tea, coffee, etc. Both sons confirmed that he drinks that much but added that he is outside in the heat working most of the day. Nurse explained the importance of EF and even though his is WNL, he still might be  drinking way to much. Advised him to discuss it with either cardiologist or Dr. Joycelyn Schmid and see if he needs to restrict his fluids at all. Will continue to monitor BP and follow plan of care.

## 2016-03-25 NOTE — Evaluation (Signed)
Occupational Therapy Assessment and Plan  Patient Details  Name: Richard Alvarado MRN: [id number] Date of Birth: 31-Mar-1941  OT Diagnosis: hemiplegia affecting non-dominant side, muscle weakness (generalized) and coordination disorder,weakness Rehab Potential: Rehab Potential (ACUTE ONLY): Good ELOS: 10 days   Today's Date: 03/25/2016 OT Individual Time: 9826-4158 OT Individual Time Calculation (min): 71 min      Problem List: Patient Active Problem List   Diagnosis Date Noted  . Family history of stroke 03/24/2016  . OSA (obstructive sleep apnea) 03/24/2016  . Hypokalemia 03/24/2016  . Nontraumatic acute hemorrhage of basal ganglia ([place]) 03/24/2016  . Ataxia   . ICH (intracerebral hemorrhage) ([place]) - R basal ganglia 03/22/2016  . Hypertensive emergency   . Diabetes mellitus type 2 in nonobese ([place]) 03/19/2016  . Pacemaker battery depletion 11/14/2014  . Hyperlipidemia 11/01/2013  . Severe hypertension 04/24/2013  . Pericardial effusion secondary to minoxidil 04/24/2013  . History of CVA (cerebrovascular accident) 04/24/2013  . Second degree heart block 04/24/2013  . Pacemaker St. Jude victory, dual chamber, 2006 04/24/2013  . Cervicalgia 01/01/2011  . Muscle weakness (generalized) 01/01/2011    Past Medical History:  Past Medical History:  Diagnosis Date  . Cerebral atherosclerosis    CAROTID DOPPLER, 09/07/2009 - RIGHT AND LEFT CCAs-small-moderate amount of irregular mixed density plaque, no significant evidence of diameter reduction; RIGHT AND LEFT ICAs-moderate amount irregular mixed density plaque, no evidence of significant diameter reduction  . CVA (cerebral vascular accident) ([place]) 1994   Left brain  . History of CVA (cerebrovascular accident) 04/24/2013  . Hypertension    RENAL DOPPLER, 03/08/2009 - normal  . Hypertension in pregnancy, preeclampsia, severe 04/24/2013  . Obstructive sleep apnea 09/26/2005   AHI-19.46, during REM-36.88  . Pacemaker St. Jude  victory, dual chamber, 2006 04/24/2013  . Pericardial effusion    2D ECHO, 03/07/2011 - EF >70%, normal  . Second degree heart block 04/24/2013   Past Surgical History:  Past Surgical History:  Procedure Laterality Date  . EP IMPLANTABLE DEVICE N/A 11/21/2014   Procedure: PPM/BIV PPM Generator Changeout;  Surgeon: [name], MD;  Location: [facility];  Service: Cardiovascular;  Laterality: N/A;  . NM MYOVIEW LTD  03/31/2012   Normal study, no ECG changes, EKG negative for ischemia, post-stress EF 54%  . PACEMAKER INSERTION  05/09/2005   St. Jude [place], [place] #[id number], serial [id number]    Assessment & Plan Clinical Impression: Patient is a 75 y.o. year old male with history of HTN, OSA,  PPM, prior CVA 2014;  who was admitted on 03/21/16 with development of BLE weakness and difficulty standing/walking while at football game. UDS negative.  He was taken to [facility] where CT head done revealing small acute R-BG hemorrhage and elevated BP. He was started on cardene drip and transferred to [facility] for treatment/evalution. CTA head/neck done revealing stable acute punctate right posterior BG hemorrhage, stable advanced microvascular disease, mild 20% stenosis proximal ICA, likely moderate stenosis L-VA and intracranial atherosclerosis.  2D echo done revealing EF 60-65% with no wall abnormality and mild calcification of MV.  Patient with resultant mild left facial weakness, left pronator drift, decrease in [facility] LLL and mild left LE weakness. PT evaluation done this weekend revealing left lateral lean, BLE weakness and giveaway with limited mobility and left inattention. CIR recommended for follow up therapy. Patient transferred to CIR on 03/24/2016 .    Patient currently requires min with basic self-care skills secondary to muscle weakness, decreased cardiorespiratoy endurance, decreased coordination, inattention and  decreased sitting balance, decreased standing balance and decreased balance strategies.   Prior to hospitalization, patient could complete ADLs with independent .  Patient will benefit from skilled intervention to increase independence with basic self-care skills prior to discharge home with care partner.  Anticipate patient will require intermittent supervision and follow up outpatient.  OT - End of Session Activity Tolerance: Decreased this session Endurance Deficit: Yes Endurance Deficit Description: multiple rest breaks secondary to fatigue OT Assessment Rehab Potential (ACUTE ONLY): Good OT Patient demonstrates impairments in the following area(s): Balance;Endurance;Motor;Safety OT Basic ADL's Functional Problem(s): Grooming;Bathing;Dressing;Toileting OT Advanced ADL's Functional Problem(s): Simple Meal Preparation;Laundry OT Transfers Functional Problem(s): Toilet;Tub/Shower OT Additional Impairment(s): None OT Plan OT Intensity: Minimum of 1-2 x/day, 45 to 90 minutes OT Frequency: 5 out of 7 days OT Duration/Estimated Length of Stay: 10 days OT Treatment/Interventions: Metallurgist training;Community reintegration;Discharge planning;Functional mobility training;Neuromuscular re-education;Self Care/advanced ADL retraining;Therapeutic Exercise;Wheelchair propulsion/positioning;DME/adaptive equipment instruction;UE/LE Strength taining/ROM;Patient/family education;UE/LE Coordination activities;Psychosocial support;Therapeutic Activities OT Self Feeding Anticipated Outcome(s): n/a OT Basic Self-Care Anticipated Outcome(s): mod I  OT Toileting Anticipated Outcome(s): Mod I  OT Bathroom Transfers Anticipated Outcome(s): mod I - toilet, supervision - shower OT Recommendation Recommendations for Other Services: Other (comment) (n/a) Patient destination: Home Follow Up Recommendations: Outpatient OT Equipment Recommended: To be determined   Skilled Therapeutic Intervention Upon entering the room, pt supine in bed awaiting therapist with no c/o pain this session. OT  eduated pt in OT purpose, POC, and goals with pt verbalizing understanding. Pt ambulated with RW to bathroom with min A . Min A for stand pivot transfer onto TTB when pt performed self care tasks at shower level while seated. Pt performed lateral lean with min A to wash buttocks and peri area. Pt returning to sit on EOB for dressing tasks. Pt needing multiple rest breaks secondary to fatigue. Pt performed tasks with increased time secondary to coordination difficulty. Pt standing at sink for grooming tasks with min A for balance. Pt seated in recliner chair at end of session with visitor arriving in the room. Call bell and all needed items within reach upon exiting the room.    OT Evaluation Precautions/Restrictions  Precautions Precautions: Fall Restrictions Weight Bearing Restrictions: No Vital Signs Therapy Vitals Temp: 98.4 F (36.9 C) Temp Source: Oral Pulse Rate: 61 Resp: 20 BP: (!) 143/80 Patient Position (if appropriate): Sitting Oxygen Therapy SpO2: 100 % O2 Device: Not Delivered Pain Pain Assessment Pain Assessment: No/denies pain Pain Score: 0-No pain Home Living/Prior Functioning Home Living Family/patient expects to be discharged to:: Private residence Living Arrangements: Spouse/significant other Available Help at Discharge: Family, Available 24 hours/day Type of Home: House Home Access: Stairs to enter [employer] of Steps: 2 steps Entrance Stairs-Rails: Left Home Layout: One level Bathroom Shower/Tub: Tub/shower unit, Engineer, building services: Pharmacist, community: Yes  Lives With: Spouse, Son, Other (Comment) Prior Function Level of Independence: Independent with gait, Independent with transfers, Independent with basic ADLs  Able to Take Stairs?: Yes Driving: Yes Vocation: Retired Leisure: Hobbies-yes (Comment) Comments: likes to watch baseball and football ADL   Vision/Perception  Vision- History Baseline Vision/History: Wears  glasses Wears Glasses: Reading only Patient Visual Report: No change from baseline Vision- Assessment Vision Assessment?: No apparent visual deficits  Cognition Overall Cognitive Status: Within Functional Limits for tasks assessed Arousal/Alertness: Awake/alert Orientation Level: Person;Place;Situation Person: Oriented Place: Oriented Situation: Oriented Year: 2017 Month: October Day of Week: Correct Memory: Appears intact Immediate Memory Recall: Sock;Blue;Bed Memory Recall: Sock;Blue;Bed Memory Recall Sock: Without Cue Memory  Recall Blue: Without Cue Memory Recall Bed: Without Cue Attention: Alternating Awareness: Impaired Awareness Impairment: Anticipatory impairment Sensation Sensation Light Touch: Appears Intact Light Touch Impaired Details: Absent LLE Additional Comments: absent LT on LLE, pt 100% accurate with deep pressure to LLE and reports that it is improving because he initially couldn't feel deep pressure on LLE. Coordination Gross Motor Movements are Fluid and Coordinated: Yes Fine Motor Movements are Fluid and Coordinated: No Finger Nose Finger Test: WFL on RUE, dysmetria on LUE Heel Shin Test: WFL on RLE, impaired on LLE Motor  Motor Motor: Abnormal tone;Other (comment) Motor - Skilled Clinical Observations: L hemiparesis Mobility  Bed Mobility Bed Mobility: Supine to Sit Supine to Sit: 4: Min assist Transfers Sit to Stand: 4: Min assist Sit to Stand Details: Tactile cues for weight shifting;Verbal cues for technique Stand to Sit: 4: Min assist Stand to Sit Details (indicate cue type and reason): Tactile cues for weight shifting;Verbal cues for technique  Trunk/Postural Assessment  Cervical Assessment Cervical Assessment: Within Functional Limits Thoracic Assessment Thoracic Assessment: Within Functional Limits Lumbar Assessment Lumbar Assessment: Within Functional Limits Postural Control Postural Control: Within Functional Limits   Balance Balance Balance Assessed: Yes Static Standing Balance Static Standing - Balance Support: Bilateral upper extremity supported Static Standing - Level of Assistance: 5: Stand by assistance Dynamic Standing Balance Dynamic Standing - Balance Support: Bilateral upper extremity supported;During functional activity Dynamic Standing - Level of Assistance: 4: Min assist Extremity/Trunk Assessment RUE Assessment RUE Assessment: Within Functional Limits LUE Assessment LUE Assessment: Exceptions to [facility] (3+/5)   See Function Navigator for Current Functional Status.   Refer to Care Plan for Long Term Goals  Recommendations for other services: None  Discharge Criteria: Patient will be discharged from OT if patient refuses treatment 3 consecutive times without medical reason, if treatment goals not met, if there is a change in medical status, if patient makes no progress towards goals or if patient is discharged from hospital.  The above assessment, treatment plan, treatment alternatives and goals were discussed and mutually agreed upon: by patient  Phineas Semen 03/25/2016, 5:02 PM

## 2016-03-25 NOTE — Care Management Note (Signed)
Inpatient [facility] Individual Statement of Services  Patient Name:  Richard Alvarado  Date:  03/25/2016  Welcome to the [facility].  Our goal is to provide you with an individualized program based on your diagnosis and situation, designed to meet your specific needs.  With this comprehensive rehabilitation program, you will be expected to participate in at least 3 hours of rehabilitation therapies Monday-Friday, with modified therapy programming on the weekends.  Your rehabilitation program will include the following services:  Physical Therapy (PT), Occupational Therapy (OT),  24 hour per day rehabilitation nursing, Therapeutic Recreaction (TR), Neuropsychology, Case Management (Social Worker), Rehabilitation Medicine, Nutrition Services and Pharmacy Services  Weekly team conferences will be held on Wednesday to discuss your progress.  Your Social Worker will talk with you frequently to get your input and to update you on team discussions.  Team conferences with you and your family in attendance may also be held.  Expected length of stay: 10-12 days  Overall anticipated outcome: mod/i level  Depending on your progress and recovery, your program may change. Your Social Worker will coordinate services and will keep you informed of any changes. Your Social Worker's name and contact numbers are listed  below.  The following services may also be recommended but are not provided by the [facility] will be made to provide these services after discharge if needed.  Arrangements include referral to agencies that provide these services.  Your insurance has been verified to be:  Medicare Your primary doctor is:  [name]  Pertinent information will be shared with your doctor and your insurance company.  Social Worker:  Ovidio Kin, [place] or ([phone]  Information discussed with and copy given to patient by: Elease Hashimoto, 03/25/2016, 12:40 PM

## 2016-03-25 NOTE — Evaluation (Signed)
Physical Therapy Assessment and Plan  Patient Details  Name: Richard Alvarado MRN: [id number] Date of Birth: 1941/04/01  PT Diagnosis: Abnormality of gait, Coordination disorder, Hemiparesis non-dominant, Impaired cognition and Impaired sensation Rehab Potential: Excellent ELOS: 10-12   Today's Date: 03/25/2016 PT Individual Time: 3419-3790 PT Individual Time Calculation (min): 75 min     Problem List: Patient Active Problem List   Diagnosis Date Noted  . Family history of stroke 03/24/2016  . OSA (obstructive sleep apnea) 03/24/2016  . Hypokalemia 03/24/2016  . Nontraumatic acute hemorrhage of basal ganglia ([place]) 03/24/2016  . Ataxia   . ICH (intracerebral hemorrhage) ([place]) - R basal ganglia 03/22/2016  . Hypertensive emergency   . Diabetes mellitus type 2 in nonobese ([place]) 03/19/2016  . Pacemaker battery depletion 11/14/2014  . Hyperlipidemia 11/01/2013  . Severe hypertension 04/24/2013  . Pericardial effusion secondary to minoxidil 04/24/2013  . History of CVA (cerebrovascular accident) 04/24/2013  . Second degree heart block 04/24/2013  . Pacemaker St. Jude victory, dual chamber, 2006 04/24/2013  . Cervicalgia 01/01/2011  . Muscle weakness (generalized) 01/01/2011    Past Medical History:  Past Medical History:  Diagnosis Date  . Cerebral atherosclerosis    CAROTID DOPPLER, 09/07/2009 - RIGHT AND LEFT CCAs-small-moderate amount of irregular mixed density plaque, no significant evidence of diameter reduction; RIGHT AND LEFT ICAs-moderate amount irregular mixed density plaque, no evidence of significant diameter reduction  . CVA (cerebral vascular accident) ([place]) 1994   Left brain  . History of CVA (cerebrovascular accident) 04/24/2013  . Hypertension    RENAL DOPPLER, 03/08/2009 - normal  . Hypertension in pregnancy, preeclampsia, severe 04/24/2013  . Obstructive sleep apnea 09/26/2005   AHI-19.46, during REM-36.88  . Pacemaker St. Jude victory, dual chamber, 2006  04/24/2013  . Pericardial effusion    2D ECHO, 03/07/2011 - EF >70%, normal  . Second degree heart block 04/24/2013   Past Surgical History:  Past Surgical History:  Procedure Laterality Date  . EP IMPLANTABLE DEVICE N/A 11/21/2014   Procedure: PPM/BIV PPM Generator Changeout;  Surgeon: [name], MD;  Location: [facility];  Service: Cardiovascular;  Laterality: N/A;  . NM MYOVIEW LTD  03/31/2012   Normal study, no ECG changes, EKG negative for ischemia, post-stress EF 54%  . PACEMAKER INSERTION  05/09/2005   St. Jude [place], [place] #5816, serial #[id number]    Assessment & Plan Clinical Impression: Richard Alvarado is a 75 year old male with history of HTN, OSA,  PPM, prior CVA 2014;  who was admitted on 03/21/16 with development of BLE weakness and difficulty standing/walking while at football game. UDS negative.  He was taken to [facility] where CT head done revealing small acute R-BG hemorrhage and elevated BP. He was started on cardene drip and transferred to [facility] for treatment/evalution. CTA head/neck done revealing stable acute punctate right posterior BG hemorrhage, stable advanced microvascular disease, mild 20% stenosis proximal ICA, likely moderate stenosis L-VA and intracranial atherosclerosis.  2D echo done revealing EF 60-65% with no wall abnormality and mild calcification of MV. Patient with resultant mild left facial weakness, left pronator drift, decrease in [facility] LLL and mild left LE weakness. PT evaluation done this weekend revealing left lateral lean, BLE weakness and giveaway with limited mobility and left inattention. Patient transferred to CIR on 03/24/2016 .   Patient currently requires min with mobility secondary to muscle weakness, impaired timing and sequencing, unbalanced muscle activation and decreased coordination, decreased attention to left, decreased attention and decreased awareness and decreased  balance strategies.  Prior to hospitalization, patient was independent   with mobility and lived with Spouse, Son, Other (Comment) (son and grandson live with pt and his wife) in a House home.  Home access is 2 steps (3" steps)Stairs to enter.  Patient will benefit from skilled PT intervention to maximize safe functional mobility and minimize fall risk for planned discharge home with 24 hour supervision.  Anticipate patient will benefit from follow up OP at discharge.  PT - End of Session Activity Tolerance: Tolerates 30+ min activity with multiple rests PT Assessment Rehab Potential (ACUTE/IP ONLY): Excellent Barriers to Discharge: Inaccessible home environment PT Patient demonstrates impairments in the following area(s): Balance;Motor;Perception;Safety PT Transfers Functional Problem(s): Bed Mobility;Bed to Chair;Car;Furniture;Floor PT Locomotion Functional Problem(s): Stairs;Ambulation PT Plan PT Intensity: Minimum of 1-2 x/day ,45 to 90 minutes PT Frequency: 5 out of 7 days PT Duration Estimated Length of Stay: 10-12 PT Treatment/Interventions: Ambulation/gait training;Community reintegration;DME/adaptive equipment instruction;Neuromuscular re-education;Psychosocial support;Stair training;UE/LE Strength taining/ROM;UE/LE Coordination activities;Therapeutic Activities;Balance/vestibular training;Discharge planning;Wheelchair propulsion/positioning;Pain management;Functional electrical stimulation;Functional mobility training;Patient/family education;Splinting/orthotics;Therapeutic Exercise PT Transfers Anticipated Outcome(s): mod I PT Locomotion Anticipated Outcome(s): mod I ambulatory with LRAD PT Recommendation Follow Up Recommendations: Outpatient PT Patient destination: Home Equipment Recommended: To be determined  Skilled Therapeutic Intervention Pt resting in recliner on arrival, no c/o pain and agreeable to therapy session.  PT provided pt education on role of PT, goals of therapy, plan of care, safety plan, ELOS, and predicted f/u needs.  PT instructs  pt in transfers and gait with overall min assist.  Pt demos decreased attention to LLE/LUE with functional mobility requiring min cues.  Pt performed nustep x10 minutes at level 5 for NMR reciprocal stepping pattern, attention to LLE/LUE to keep them in place on machine, and attention to time.  Pt returned to room at end of session and repositioned in recliner with call bell in reach and needs met.   PT Evaluation Precautions/Restrictions Precautions Precautions: Fall Restrictions Weight Bearing Restrictions: No Pain Pain Assessment Pain Assessment: No/denies pain Home Living/Prior Functioning Home Living Living Arrangements: Spouse/significant other Available Help at Discharge: Family;Available 24 hours/day Type of Home: House Home Access: Stairs to enter [employer] of Steps: 2 steps (3" steps) Entrance Stairs-Rails: Left Home Layout: One level  Lives With: Spouse;Son;Other (Comment) (son and grandson live with pt and his wife) Prior Function Level of Independence: Independent with gait;Independent with transfers  Able to Take Stairs?: Yes Driving: Yes Vocation: Retired Leisure: Hobbies-yes (Comment) Comments: likes to watch baseball and football  Cognition Overall Cognitive Status: Within Functional Limits for tasks assessed Arousal/Alertness: Awake/alert Orientation Level: Oriented X4 Attention: Alternating Alternating Attention: Appears intact Memory: Appears intact Awareness: Impaired Awareness Impairment: Anticipatory impairment Sensation Sensation Light Touch: Impaired Detail Light Touch Impaired Details: Absent LLE Additional Comments: absent LT on LLE, pt 100% accurate with deep pressure to LLE and reports that it is improving because he initially couldn't feel deep pressure on LLE. Coordination Gross Motor Movements are Fluid and Coordinated: Yes Fine Motor Movements are Fluid and Coordinated: Not tested Finger Nose Finger Test: WFL on RUE,  dysmetria on LUE Heel Shin Test: WFL on RLE, impaired on LLE Motor  Motor Motor: Abnormal tone;Other (comment) Motor - Skilled Clinical Observations: L hemiparesis  Mobility Bed Mobility Bed Mobility: Not assessed Transfers Transfers: Yes Sit to Stand: 4: Min assist Sit to Stand Details: Tactile cues for weight shifting;Verbal cues for technique Stand to Sit: 4: Min assist Stand to Sit Details (indicate cue type and reason): Tactile cues  for weight shifting;Verbal cues for technique Locomotion  Ambulation Ambulation: Yes Ambulation/Gait Assistance: 4: Min assist Ambulation Distance (Feet): 100 Feet Assistive device: Rolling walker Ambulation/Gait Assistance Details: Verbal cues for safe use of DME/AE;Verbal cues for gait pattern;Verbal cues for technique Gait Gait: Yes Gait Pattern: Impaired Gait Pattern: Step-to pattern;Decreased stride length;Decreased dorsiflexion - left;Decreased weight shift to left;Left circumduction;Left foot flat Stairs / Additional Locomotion Stairs: Yes Stairs Assistance: 4: Min assist Stair Management Technique: Two rails Number of Stairs: 12 Ramp: 4: Min assist Curb: 4: Min Administrator Mobility: No  Trunk/Postural Assessment  Cervical Assessment Cervical Assessment: Within Functional Limits Thoracic Assessment Thoracic Assessment: Within Functional Limits Lumbar Assessment Lumbar Assessment: Within Functional Limits Postural Control Postural Control: Within Functional Limits  Balance Balance Balance Assessed: Yes Static Standing Balance Static Standing - Balance Support: Bilateral upper extremity supported Static Standing - Level of Assistance: 5: Stand by assistance Dynamic Standing Balance Dynamic Standing - Balance Support: Bilateral upper extremity supported;During functional activity Dynamic Standing - Level of Assistance: 4: Min assist Extremity Assessment      RLE Assessment RLE Assessment: Within  Functional Limits RLE AROM (degrees) RLE Overall AROM Comments: WFL assessed in sitting RLE Strength Right Hip Flexion: 5/5 Right Knee Flexion: 5/5 Right Knee Extension: 5/5 Right Ankle Dorsiflexion: 5/5 Right Ankle Plantar Flexion: 5/5 LLE Assessment LLE Assessment: Exceptions to WFL LLE AROM (degrees) LLE Overall AROM Comments: WFL assessed in sitting LLE Strength Left Hip Flexion: 4/5 Left Knee Flexion: 5/5 Left Knee Extension: 5/5 Left Ankle Dorsiflexion: 4-/5 Left Ankle Plantar Flexion: 4+/5   See Function Navigator for Current Functional Status.   Refer to Care Plan for Long Term Goals  Recommendations for other services: None  Discharge Criteria: Patient will be discharged from PT if patient refuses treatment 3 consecutive times without medical reason, if treatment goals not met, if there is a change in medical status, if patient makes no progress towards goals or if patient is discharged from hospital.  The above assessment, treatment plan, treatment alternatives and goals were discussed and mutually agreed upon: by patient  Urban Gibson E Penven-Crew 03/25/2016, 2:03 PM

## 2016-03-25 NOTE — Progress Notes (Signed)
Call provider and notified patients BP 210/90. Ordered to give scheduled 0800 medications.

## 2016-03-25 NOTE — Progress Notes (Signed)
2035: Patients BP 190/94. Scheduled Clonidine 0.2mg  given PO.  2200: Rechecked BP Right arm 198/93 and left arm 194/90. Called provider on call P. Love PA who ordered Clonidine 0.1mg  Now and to recheck BP in an hour. 2257: Administered Clonidine as ordered. 0020: Rechecked BP 182/90. Called P.Love back and notified her of current BP. Stated to do nothing else. Informed her of Neurology note stating a goal BP <160/90 and a previous order for Labetolol IV if SBP > 180. Still no orders given. Will continue to monitor and follow plan of care.

## 2016-03-26 ENCOUNTER — Inpatient Hospital Stay (HOSPITAL_COMMUNITY): Payer: Medicare Other | Admitting: Physical Therapy

## 2016-03-26 ENCOUNTER — Inpatient Hospital Stay (HOSPITAL_COMMUNITY): Payer: Medicare Other | Admitting: *Deleted

## 2016-03-26 ENCOUNTER — Inpatient Hospital Stay (HOSPITAL_COMMUNITY): Payer: Medicare Other | Admitting: Occupational Therapy

## 2016-03-26 LAB — GLUCOSE, CAPILLARY
Glucose-Capillary: 125 mg/dL — ABNORMAL HIGH (ref 65–99)
Glucose-Capillary: 88 mg/dL (ref 65–99)
Glucose-Capillary: 93 mg/dL (ref 65–99)
Glucose-Capillary: 82 mg/dL (ref 65–99)
Glucose-Capillary: 165 mg/dL — ABNORMAL HIGH (ref 65–99)

## 2016-03-26 MED ORDER — LOSARTAN POTASSIUM 50 MG PO TABS
100.0000 mg | ORAL_TABLET | Freq: Two times a day (BID) | ORAL | Status: DC
  Administered 2016-03-26 – 2016-04-03 (×16): 100 mg via ORAL
  Filled 2016-03-26 (×16): qty 2

## 2016-03-26 MED ORDER — CLONIDINE HCL 0.1 MG PO TABS
0.1000 mg | ORAL_TABLET | Freq: Four times a day (QID) | ORAL | Status: DC | PRN
  Administered 2016-04-01: 0.1 mg via ORAL
  Filled 2016-03-26 (×2): qty 1

## 2016-03-26 MED ORDER — CLONIDINE HCL 0.1 MG PO TABS
0.2000 mg | ORAL_TABLET | Freq: Three times a day (TID) | ORAL | Status: DC
  Administered 2016-03-26 – 2016-04-03 (×24): 0.2 mg via ORAL
  Filled 2016-03-26: qty 1
  Filled 2016-03-26 (×3): qty 2
  Filled 2016-03-26: qty 1
  Filled 2016-03-26 (×4): qty 2
  Filled 2016-03-26: qty 1
  Filled 2016-03-26 (×6): qty 2
  Filled 2016-03-26: qty 1
  Filled 2016-03-26 (×8): qty 2

## 2016-03-26 NOTE — Evaluation (Signed)
Recreational Therapy Assessment and Plan  Patient Details  Name: Richard Alvarado MRN: [id number] Date of Birth: 05-Sep-1940 Today's Date: 03/26/2016  Rehab Potential: Good ELOS: 10 days   Assessment Clinical Impression:  Problem List: Patient Active Problem List   Diagnosis Date Noted  . Family history of stroke 03/24/2016  . OSA (obstructive sleep apnea) 03/24/2016  . Hypokalemia 03/24/2016  . Nontraumatic acute hemorrhage of basal ganglia ([address]) 03/24/2016  . Ataxia   . ICH (intracerebral hemorrhage) ([place]) - R basal ganglia 03/22/2016  . Hypertensive emergency   . Diabetes mellitus type 2 in nonobese ([place]) 03/19/2016  . Pacemaker battery depletion 11/14/2014  . Hyperlipidemia 11/01/2013  . Severe hypertension 04/24/2013  . Pericardial effusion secondary to minoxidil 04/24/2013  . History of CVA (cerebrovascular accident) 04/24/2013  . Second degree heart block 04/24/2013  . Pacemaker St. Jude victory, dual chamber, 2006 04/24/2013  . Cervicalgia 01/01/2011  . Muscle weakness (generalized) 01/01/2011    Past Medical History:      Past Medical History:  Diagnosis Date  . Cerebral atherosclerosis    CAROTID DOPPLER, 09/07/2009 - RIGHT AND LEFT CCAs-small-moderate amount of irregular mixed density plaque, no significant evidence of diameter reduction; RIGHT AND LEFT ICAs-moderate amount irregular mixed density plaque, no evidence of significant diameter reduction  . CVA (cerebral vascular accident) ([place]) 1994   Left brain  . History of CVA (cerebrovascular accident) 04/24/2013  . Hypertension    RENAL DOPPLER, 03/08/2009 - normal  . Hypertension in pregnancy, preeclampsia, severe 04/24/2013  . Obstructive sleep apnea 09/26/2005   AHI-19.46, during REM-36.88  . Pacemaker St. Jude victory, dual chamber, 2006 04/24/2013  . Pericardial effusion    2D ECHO, 03/07/2011 - EF >70%, normal  . Second degree heart block 04/24/2013   Past Surgical History:       Past  Surgical History:  Procedure Laterality Date  . EP IMPLANTABLE DEVICE N/A 11/21/2014   Procedure: PPM/BIV PPM Generator Changeout;  Surgeon: [name], MD;  Location: [place] [facility];  Service: Cardiovascular;  Laterality: N/A;  . NM MYOVIEW LTD  03/31/2012   Normal study, no ECG changes, EKG negative for ischemia, post-stress EF 54%  . PACEMAKER INSERTION  05/09/2005   St. Jude [place], [place] #[id number], serial [phone]    Assessment & Plan Clinical Impression: Patient is a 75 y.o. year old male with history of HTN, OSA, PPM, prior CVA 2014; who was admitted on 03/21/16 with development of BLE weakness and difficulty standing/walking while at football game. UDS negative. He was taken to [facility] where CT head done revealing small acute R-BG hemorrhage and elevated BP. He was started on cardene drip and transferred to [facility] for treatment/evalution. CTA head/neck done revealing stable acute punctate right posterior BG hemorrhage, stable advanced microvascular disease, mild 20% stenosis proximal ICA, likely moderate stenosis L-VA and intracranial atherosclerosis. 2D echo done revealing EF 60-65% with no wall abnormality and mild calcification of MV. Patient with resultant mild left facial weakness, left pronator drift, decrease in [facility] LLL and mild left LE weakness. PT evaluation done this weekend revealing left lateral lean, BLE weakness and giveaway with limited mobility and left inattention. CIR recommended for follow up therapy. Patient transferred to CIR on 03/24/2016.    Pt presents with decreased activity tolerance, decreased functional mobility, decreased balance, decreased coordination Limiting pt's independence with leisure/community pursuits.  Leisure History/Participation Premorbid leisure interest/current participation: Medical laboratory scientific officer - Psychologist, forensic;[place]ra [place] - Flower gardening;Nature - [place] care;Community - Volunteer (Comment);Community - Travel (  Comment) Other  Leisure Interests: Television;Reading;Housework Leisure Participation Style: With Family/Friends Awareness of Community Resources: Excellent Psychosocial / Spiritual Spiritual Interests: [place] Patient agreeable to Pet Therapy: Yes Does patient have pets?: No Social interaction - Mood/Behavior: Cooperative Engineer, drilling for Education?: Yes Patient Agreeable to Outing?: Yes Recreational Therapy Orientation Orientation -Reviewed with patient: Available activity resources Strengths/Weaknesses Patient Strengths/Abilities: Willingness to participate;Active premorbidly Patient weaknesses: Physical limitations TR Patient demonstrates impairments in the following area(s): Endurance;Motor;Safety  Plan Rec Therapy Plan Is patient appropriate for Therapeutic Recreation?: Yes Treatment times per week: Min 1 time per week >20 minutes Estimated Length of Stay: 10 days TR Treatment/Interventions: Adaptive equipment instruction;1:1 session;Balance/vestibular training;Functional mobility training;Community reintegration;Patient/family education;Therapeutic activities;Recreation/leisure participation;Therapeutic exercise;UE/LE Coordination activities  Recommendations for other services: None  Discharge Criteria: Patient will be discharged from TR if patient refuses treatment 3 consecutive times without medical reason.  If treatment goals not met, if there is a change in medical status, if patient makes no progress towards goals or if patient is discharged from hospital.  The above assessment, treatment plan, treatment alternatives and goals were discussed and mutually agreed upon: by patient  [place] 03/26/2016, 3:57 PM

## 2016-03-26 NOTE — Progress Notes (Signed)
Physical Therapy Session Note  Patient Details  Name: Richard Alvarado MRN: [id number] Date of Birth: 11-27-1940  Today's Date: 03/26/2016 PT Individual Time: 1500-1600 PT Individual Time Calculation (min): 60 min    Short Term Goals: Week 1:  PT Short Term Goal 1 (Week 1): =LTGs due to ELOS  Skilled Therapeutic Interventions/Progress Updates:    Pt resting in recliner on arrival with family present, no c/o pain and agreeable to therapy session.  Previous PT noted pt's L ankle to have significant instability during session.  Gait training to and from therapy gym with RW and supervision with min verbal cues to attend to L ankle positioning.  Standing balance on firm surface and with LLE on foam surfaces during horse shoe task with supervision to steady assist for pt's balance and with min verbal cues for ankle positioning.  LLE NMR via forced use for single limb bridges with tactile cues for L knee and L ankle positioning, and BLE support bridges with ball squeeze with initial tactile cues at ankle fade to supervision.  Pt completed 3x10 of each exercise.  NMR in // bars stepping forwards/backwards/side stepping focus on placement of LLE and positioning of ankle during task.  Pt completed 5 lengths down/back of // bars for each direction of stepping.  Initially requiring max multimodal cues to complete task without rolling ankle, fade min verbal cues.  Pt returned to room at end of session and positioned upright in recliner with call bell in reach and needs met.   Therapy Documentation Precautions:  Precautions Precautions: Fall Restrictions Weight Bearing Restrictions: No   See Function Navigator for Current Functional Status.   Therapy/Group: Individual Therapy  Earnest Conroy Penven-Crew 03/26/2016, 4:48 PM

## 2016-03-26 NOTE — Patient Care Conference (Signed)
Inpatient RehabilitationTeam Conference and Plan of Care Update Date: 03/26/2016   Time: 11:15 AM    Patient Name: Richard Alvarado      Medical Record Number: [id number]  Date of Birth: 1941/04/30 Sex: Male         Room/Bed: 4W22C/4W22C-01 Payor Info: Payor: MEDICARE / Plan: MEDICARE PART A AND B / Product Type: *No Product type* /    Admitting Diagnosis: ICH  Admit Date/Time:  03/24/2016  2:24 PM Admission Comments: No comment available   Primary Diagnosis:  <principal problem not specified> Principal Problem: <principal problem not specified>  Patient Active Problem List   Diagnosis Date Noted  . Family history of stroke 03/24/2016  . OSA (obstructive sleep apnea) 03/24/2016  . Hypokalemia 03/24/2016  . Nontraumatic acute hemorrhage of basal ganglia ([place]) 03/24/2016  . Ataxia   . ICH (intracerebral hemorrhage) ([place]) - R basal ganglia 03/22/2016  . Hypertensive emergency   . Diabetes mellitus type 2 in nonobese ([place]) 03/19/2016  . Pacemaker battery depletion 11/14/2014  . Hyperlipidemia 11/01/2013  . Severe hypertension 04/24/2013  . Pericardial effusion secondary to minoxidil 04/24/2013  . History of CVA (cerebrovascular accident) 04/24/2013  . Second degree heart block 04/24/2013  . Pacemaker St. Jude victory, dual chamber, 2006 04/24/2013  . Cervicalgia 01/01/2011  . Muscle weakness (generalized) 01/01/2011    Expected Discharge Date: Expected Discharge Date: 04/03/16  Team Members Present: Physician leading conference: Dr. Alysia Penna Social Worker Present: Ovidio Kin, LCSW Nurse Present: Heather Roberts, RN PT Present: Dwyane Dee, PT OT Present: Simonne Come, OT SLP Present: Windell Moulding, SLP PPS Coordinator present : Daiva Nakayama, RN, CRRN     Current Status/Progress Goal Weekly Team Focus  Medical   Elevated BPs,   Goal of BP in hospital 160/90  med managment   Bowel/Bladder   continent bowel and bladder 1 episode incontinence reported LBM  10-16   mod I  monitor issues with bowel and bladder   Swallow/Nutrition/ Hydration   Patient and family report he drinks 10-12 bottles of water daily plus tea and other beverages. To help decrease BP patient should limit fluids.  Limit fluid intake to 2 liters per day and perform daily weights.      ADL's   Needs minimal to moderate assistance with adl's.  Will be independent with all adl's that he did prior to admission.      Mobility   min assist ovrall  mod I overall with LRAD  balance, gait training with LRAD, endurance, NMR for L side   Communication   Hard of hearing         Safety/Cognition/ Behavioral Observations            Pain   No pain  No pain      Skin   No abnormalities  No abnormalities         *See Care Plan and progress notes for long and short-term goals.  Barriers to Discharge: some ataxia on left    Possible Resolutions to Barriers:  cont rehab    Discharge Planning/Teaching Needs:  Home with wife who is a retired Therapist, sports and can provide care.   BP medications, when to assess BP at home, low sodium and cholesterol diet to improve BP, and limiting fluids to help control BP.   Team Discussion:  MD working on BP and medications-still running on the high side. Goals mod/i level with therapy team making good progress. Pt motivated and doing well in therapies, even with  BP issues.  Revisions to Treatment Plan:  DC 10/26   Continued Need for Acute Rehabilitation Level of Care: The patient requires daily medical management by a physician with specialized training in physical medicine and rehabilitation for the following conditions: Daily direction of a multidisciplinary physical rehabilitation program to ensure safe treatment while eliciting the highest outcome that is of practical value to the patient.: Yes Daily medical management of patient stability for increased activity during participation in an intensive rehabilitation regime.: Yes Daily analysis of  laboratory values and/or radiology reports with any subsequent need for medication adjustment of medical intervention for : Neurological problems  Pattie Flaharty, Gardiner Rhyme 03/26/2016, 1:35 PM

## 2016-03-26 NOTE — Progress Notes (Signed)
75 year old male with history of HTN, OSA,  PPM, prior CVA 2014;  who was admitted on 03/21/16 with development of BLE weakness and difficulty standing/walking while at football game. UDS negative.  He was taken to [facility] where CT head done revealing small acute R-BG hemorrhage and elevated BP. He was started on cardene drip and transferred to [facility] for treatment/evalution. CTA head/neck done revealing stable acute punctate right posterior BG hemorrhage, stable advanced microvascular disease, mild 20% stenosis proximal ICA, likely moderate stenosis L-VA and intracranial atherosclerosis.  2D echo done revealing EF 60-65% with no wall abnormality and mild calcification of MV.  Patient with resultant mild left facial weakness, left pronator drift, decrease in [facility] LLL and mild left LE weakness.  Subjective/Complaints: Appreciate RN note, pt slept well, good appetite.  Bowel an dbladder cont per pt Discussed BP and meds  ROS:  No CP. SOB,N/V/D  Objective: Vital Signs: Blood pressure (!) 178/84, pulse 60, temperature 98.6 F (37 C), temperature source Oral, resp. rate 20, height _0  (1.778 m), weight 93.7 kg (206 lb 9.6 oz), SpO2 100 %. No results found. Results for orders placed or performed during the hospital encounter of 03/24/16 (from the past 72 hour(s))  Glucose, capillary     Status: Abnormal   Collection Time: 03/24/16  4:41 PM  Result Value Ref Range   Glucose-Capillary 115 (H) 65 - 99 mg/dL  Glucose, capillary     Status: None   Collection Time: 03/24/16  9:46 PM  Result Value Ref Range   Glucose-Capillary 85 65 - 99 mg/dL  CBC with Differential/Platelet     Status: Abnormal   Collection Time: 03/25/16  6:39 AM  Result Value Ref Range   WBC 5.7 4.0 - 10.5 K/uL   RBC 5.49 4.22 - 5.81 MIL/uL   Hemoglobin 13.4 13.0 - 17.0 g/dL   HCT 41.3 39.0 - 52.0 %   MCV 75.2 (L) 78.0 - 100.0 fL   MCH 24.4 (L) 26.0 - 34.0 pg   MCHC 32.4 30.0 - 36.0 g/dL   RDW 14.2 11.5 - 15.5 %   Platelets 216  150 - 400 K/uL   Neutrophils Relative % 69 %   Neutro Abs 4.0 1.7 - 7.7 K/uL   Lymphocytes Relative 16 %   Lymphs Abs 0.9 0.7 - 4.0 K/uL   Monocytes Relative 11 %   Monocytes Absolute 0.6 0.1 - 1.0 K/uL   Eosinophils Relative 4 %   Eosinophils Absolute 0.2 0.0 - 0.7 K/uL   Basophils Relative 0 %   Basophils Absolute 0.0 0.0 - 0.1 K/uL  Comprehensive metabolic panel     Status: Abnormal   Collection Time: 03/25/16  6:39 AM  Result Value Ref Range   Sodium 138 135 - 145 mmol/L   Potassium 3.7 3.5 - 5.1 mmol/L   Chloride 108 101 - 111 mmol/L   CO2 21 (L) 22 - 32 mmol/L   Glucose, Bld 119 (H) 65 - 99 mg/dL   BUN 11 6 - 20 mg/dL   Creatinine, Ser 0.99 0.61 - 1.24 mg/dL   Calcium 9.2 8.9 - 10.3 mg/dL   Total Protein 7.7 6.5 - 8.1 g/dL   Albumin 3.5 3.5 - 5.0 g/dL   AST 23 15 - 41 U/L   ALT 16 (L) 17 - 63 U/L   Alkaline Phosphatase 68 38 - 126 U/L   Total Bilirubin 0.8 0.3 - 1.2 mg/dL   GFR calc non Af Amer >60 >60 mL/min  GFR calc Af Amer >60 >60 mL/min    Comment: (NOTE) The eGFR has been calculated using the CKD EPI equation. This calculation has not been validated in all clinical situations. eGFR's persistently <60 mL/min signify possible Chronic Kidney Disease.    Anion gap 9 5 - 15  Glucose, capillary     Status: Abnormal   Collection Time: 03/25/16  6:39 AM  Result Value Ref Range   Glucose-Capillary 119 (H) 65 - 99 mg/dL  Glucose, capillary     Status: Abnormal   Collection Time: 03/25/16 11:31 AM  Result Value Ref Range   Glucose-Capillary 125 (H) 65 - 99 mg/dL  Glucose, capillary     Status: None   Collection Time: 03/25/16  4:26 PM  Result Value Ref Range   Glucose-Capillary 88 65 - 99 mg/dL  Glucose, capillary     Status: None   Collection Time: 03/25/16  9:42 PM  Result Value Ref Range   Glucose-Capillary 93 65 - 99 mg/dL   Comment 1 Notify RN   Glucose, capillary     Status: None   Collection Time: 03/26/16  6:32 AM  Result Value Ref Range    Glucose-Capillary 82 65 - 99 mg/dL     HEENT: normal Cardio: RRR and no murmur Resp: RRR and no murmur GI: BS positive and NT, ND Extremity:  Pulses positive and No Edema Skin:   Intact Neuro: Alert/Oriented, Normal Sensory, Abnormal Motor 4/5 Left Delt bi, tri, grip, HF, KE, ADF and Abnormal FMC Ataxic/ dec FMC Musc/Skel:  Other no pain with AAROM Gen NAD   Assessment/Plan: 1. Functional deficits secondary to Right basal ganglia ICH which require 3+ hours per day of interdisciplinary therapy in a comprehensive inpatient rehab setting. Physiatrist is providing close team supervision and 24 hour management of active medical problems listed below. Physiatrist and rehab team continue to assess barriers to discharge/monitor patient progress toward functional and medical goals. FIM: Function - Bathing Position: Shower Body parts bathed by patient: Right arm, Left arm, Chest, Abdomen, Front perineal area, Buttocks, Right upper leg, Left upper leg Body parts bathed by helper: Right lower leg, Left lower leg, Back Assist Level: Touching or steadying assistance(Pt > 75%)  Function- Upper Body Dressing/Undressing What is the patient wearing?: Button up shirt Button up shirt - Perfomed by patient: Thread/unthread right sleeve, Thread/unthread left sleeve, Pull shirt around back, Button/unbutton shirt Assist Level: Set up Function - Lower Body Dressing/Undressing What is the patient wearing?: Underwear, Pants, Socks, Shoes Position: Sitting EOB Underwear - Performed by patient: Thread/unthread right underwear leg, Pull underwear up/down Underwear - Performed by helper: Thread/unthread left underwear leg Pants- Performed by patient: Thread/unthread right pants leg, Pull pants up/down Pants- Performed by helper: Thread/unthread left pants leg Socks - Performed by patient: Don/doff right sock, Don/doff left sock Shoes - Performed by patient: Don/doff right shoe, Don/doff left shoe, Fasten  right, Fasten left Assist for footwear: Setup, Supervision/touching assist Assist for lower body dressing: Touching or steadying assistance (Pt > 75%)  Function - Toileting Toileting activity did not occur: No continent bowel/bladder event     Function - Chair/bed transfer Chair/bed transfer method: Ambulatory, Stand pivot Chair/bed transfer assist level: Touching or steadying assistance (Pt > 75%) Chair/bed transfer assistive device: Armrests, Walker  Function - Locomotion: Wheelchair Will patient use wheelchair at discharge?: No Max wheelchair distance: 150 Assist Level: Dependent (Pt equals 0%) Assist Level: Dependent (Pt equals 0%) Assist Level: Dependent (Pt equals 0%) Turns around,maneuvers to table,bed, and  toilet,negotiates 3% grade,maneuvers on rugs and over doorsills: No Function - Locomotion: Ambulation Assistive device: Walker-rolling Max distance: 100 Assist level: Touching or steadying assistance (Pt > 75%) Assist level: Touching or steadying assistance (Pt > 75%) Assist level: Touching or steadying assistance (Pt > 75%) Walk 150 feet activity did not occur: Safety/medical concerns (endurance') Assist level: Touching or steadying assistance (Pt > 75%)  Function - Comprehension Comprehension: Auditory Comprehension assist level: Follows basic conversation/direction with no assist  Function - Expression Expression: Verbal Expression assist level: Expresses basic needs/ideas: With extra time/assistive device  Function - Social Interaction Social Interaction assist level: Interacts appropriately with others with medication or extra time (anti-anxiety, antidepressant).  Function - Problem Solving Problem solving assist level: Solves basic problems with no assist  Function - Memory Memory assist level: More than reasonable amount of time Patient normally able to recall (first 3 days only): Current season, Location of own room, Staff names and faces, That he or  she is in a hospital Medical Problem List and Plan: 1.  Ataxia, gait disorder secondary to right basal ganglia hemorrhage             -Team conference today please see physician documentation under team conference tab, met with team face-to-face to discuss problems,progress, and goals. Formulized individual treatment plan based on medical history, underlying problem and comorbidities. 2.  DVT Prophylaxis/Anticoagulation: Mechanical: Sequential compression devices, below knee Bilateral lower extremities-start lovenox in am.  3. Pain Management:  N/A 4. Mood: LCSW to follow for evaluation and support.  5. Neuropsych: This patient is capable of making decisions on his own behalf. 6. Skin/Wound Care: Routine pressure relief measures 7. Fluids/Electrolytes/Nutrition: Monitor I/O. BUN/Cr nl 8.HTN: monitor BP tid. Continue Norvasc, Increase catapress to TID, lasix, bystolic and losartan.Add prn clonidine for sys BP>160 Vitals:   03/26/16 0500 03/26/16 0923  BP: (!) 158/80 (!) 178/84  Pulse: 60   Resp:    Temp: 98.6 F (37 C)    9.T2DM: Monitor BS ac/hs. Appetite has been good. Continue metformin and glipizide.  CBG (last 3)   Recent Labs  03/25/16 1626 03/25/16 2142 03/26/16 0632  GLUCAP 88 93 82    10 Dyslipidemia: Resume at discharge.   Labwork reviewed looks normal   LOS (Days) 2 A FACE TO FACE EVALUATION WAS PERFORMED  Sharronda Schweers E 03/26/2016, 9:41 AM

## 2016-03-26 NOTE — Progress Notes (Signed)
Orthopedic Tech Progress Note Patient Details:  Richard Alvarado Mar 20, 1941 [id number]  Ortho Devices Type of Ortho Device: Ankle Air splint Ortho Device/Splint Location: Lt Leg Ortho Device/Splint Interventions: Ordered, Application   Richard Alvarado 03/26/2016, 7:34 PM

## 2016-03-26 NOTE — Progress Notes (Signed)
Social Work Elease Hashimoto, LCSW Social Worker Signed   Patient Care Conference Date of Service: 03/26/2016  1:35 PM      Hide copied text Hover for attribution information Inpatient RehabilitationTeam Conference and Plan of Care Update Date: 03/26/2016   Time: 11:15 AM      Patient Name: Richard Alvarado      Medical Record Number: [id number]  Date of Birth: 1941/01/02 Sex: Male         Room/Bed: 4W22C/4W22C-01 Payor Info: Payor: MEDICARE / Plan: MEDICARE PART A AND B / Product Type: *No Product type* /     Admitting Diagnosis: ICH  Admit Date/Time:  03/24/2016  2:24 PM Admission Comments: No comment available    Primary Diagnosis:  <principal problem not specified> Principal Problem: <principal problem not specified>       Patient Active Problem List    Diagnosis Date Noted  . Family history of stroke 03/24/2016  . OSA (obstructive sleep apnea) 03/24/2016  . Hypokalemia 03/24/2016  . Nontraumatic acute hemorrhage of basal ganglia ([place]) 03/24/2016  . Ataxia    . ICH (intracerebral hemorrhage) ([place]) - R basal ganglia 03/22/2016  . Hypertensive emergency    . Diabetes mellitus type 2 in nonobese ([place]) 03/19/2016  . Pacemaker battery depletion 11/14/2014  . Hyperlipidemia 11/01/2013  . Severe hypertension 04/24/2013  . Pericardial effusion secondary to minoxidil 04/24/2013  . History of CVA (cerebrovascular accident) 04/24/2013  . Second degree heart block 04/24/2013  . Pacemaker St. Jude victory, dual chamber, 2006 04/24/2013  . Cervicalgia 01/01/2011  . Muscle weakness (generalized) 01/01/2011      Expected Discharge Date: Expected Discharge Date: 04/03/16   Team Members Present: Physician leading conference: Dr. Alysia Penna Social Worker Present: Ovidio Kin, LCSW Nurse Present: Heather Roberts, RN PT Present: Dwyane Dee, PT OT Present: Simonne Come, OT SLP Present: Windell Moulding, SLP PPS Coordinator present : Daiva Nakayama, RN, CRRN       Current  Status/Progress Goal Weekly Team Focus  Medical   Elevated BPs,   Goal of BP in hospital 160/90  med managment   Bowel/Bladder   continent bowel and bladder 1 episode incontinence reported LBM 10-16   mod I  monitor issues with bowel and bladder   Swallow/Nutrition/ Hydration   Patient and family report he drinks 10-12 bottles of water daily plus tea and other beverages. To help decrease BP patient should limit fluids.  Limit fluid intake to 2 liters per day and perform daily weights.      ADL's   Needs minimal to moderate assistance with adl's.  Will be independent with all adl's that he did prior to admission.      Mobility   min assist ovrall  mod I overall with LRAD  balance, gait training with LRAD, endurance, NMR for L side   Communication   Hard of hearing         Safety/Cognition/ Behavioral Observations           Pain   No pain  No pain      Skin   No abnormalities  No abnormalities          *See Care Plan and progress notes for long and short-term goals.   Barriers to Discharge: some ataxia on left   Possible Resolutions to Barriers:  cont rehab   Discharge Planning/Teaching Needs:  Home with wife who is a retired Therapist, sports and can provide care.   BP medications, when to assess BP at home,  low sodium and cholesterol diet to improve BP, and limiting fluids to help control BP.   Team Discussion:  MD working on BP and medications-still running on the high side. Goals mod/i level with therapy team making good progress. Pt motivated and doing well in therapies, even with BP issues.  Revisions to Treatment Plan:  DC 10/26    Continued Need for Acute Rehabilitation Level of Care: The patient requires daily medical management by a physician with specialized training in physical medicine and rehabilitation for the following conditions: Daily direction of a multidisciplinary physical rehabilitation program to ensure safe treatment while eliciting the highest outcome that is of practical  value to the patient.: Yes Daily medical management of patient stability for increased activity during participation in an intensive rehabilitation regime.: Yes Daily analysis of laboratory values and/or radiology reports with any subsequent need for medication adjustment of medical intervention for : Neurological problems   Elease Hashimoto 03/26/2016, 1:35 PM       Patient ID: ANCE REDIC, male   DOB: [birth year], 75 y.o.   MRN: [id number]

## 2016-03-26 NOTE — Progress Notes (Signed)
Occupational Therapy Session Note  Patient Details  Name: Richard Alvarado MRN: [id number] Date of Birth: 15-Dec-1940  Today's Date: 03/26/2016 OT Individual Time: [id number] OT Individual Time Calculation (min): 71 min     Short Term Goals: Week 1:  OT Short Term Goal 1 (Week 1): STGs=LTGs secondary to estimated LOS  Skilled Therapeutic Interventions/Progress Updates:    Treatment session with focus on functional mobility, transfers, standing balance, and LUE NMR.  Pt received upright in recliner reporting no pain.  Ambulated to bathroom with RW and min guard - min assist to complete toileting in standing.  Pt with LOB in bathroom due to inattention to LLE postioning in standing requiring mod assist to correct/regain balance.  Min assist - min guard with ambulation this session, pt ambulated 167 feet with RW to therapy gym without reports of fatigue/weakness.  Engaged in 9 hole peg test with Rt: 30.8 seconds and Lt: 34.15 seconds with minimal difference noted in Rt vs Lt, however pt reports decreased motor control with LUE.  Engaged in additional LUE fine motor and strengthening activities in sitting and standing with use of resistive clothes pins and pegs in resistive foam mat.  Pt able to complete various pinches and grasps with clothespins, completing supination/pronation and in-hand manipulation to place clothespins on both horizontal and vertical dowels.  Pt demonstrated difficulty with replicating picture model with pegs, especially when pegs were on a diagonal.  Required mod cues to recognize and correct errors. Utilized in-hand manipulation and translation with peg activity.  Provided pt with medium (red) theraputty and strengthening and [place] HEP.  Pt demonstrated each exercise even locating 10 beads in putty.  Returned to room as above and left upright in recliner with family present.  Therapy Documentation Precautions:  Precautions Precautions: Fall Restrictions Weight Bearing  Restrictions: No General:   Vital Signs: Therapy Vitals Temp: 98.7 F (37.1 C) Temp Source: Oral Pulse Rate: 63 Resp: 18 BP: (!) 146/78 Patient Position (if appropriate): Sitting Oxygen Therapy SpO2: 100 % O2 Device: Not Delivered Pain:  Pt with no c/o pain  See Function Navigator for Current Functional Status.   Therapy/Group: Individual Therapy  Simonne Come 03/26/2016, 3:26 PM

## 2016-03-26 NOTE — Progress Notes (Signed)
Physical Therapy Session Note  Patient Details  Name: Richard Alvarado MRN: [id number] Date of Birth: [birth year]   Today's Date: 03/26/2016 PT Individual Time:1100-1200 PT Individual Time Calculation: 60 min       Short Term Goals: Week 1:  PT Short Term Goal 1 (Week 1): =LTGs due to ELOS  Skilled Therapeutic Interventions/Progress Updates:     Patient received supine in bed and agreeable to PT. Supine<>sit without cues or assist from PT. Sitting EOB to don shoes with superivsion Assist from PT.  Stand pivot transfer to [facility] with RW and min Assist from PT. As well as cues for AD management and increased step length.  WC mobility with supervision Assist from PT x 197ft. Min cues for safety and doorway management.   Gait training for 162ft with RW and supervision Assit from PT with cues for AD managmenet and to reminan inside [place].   PT instructed patient in [place] test; see below   Standing balance on airex pad with weight shifts L and R x 3 minutes and standing balance on airex to build cross and box on pipe tree with supervision-Min Assist and min-mod cues for problem solving and ankle stability on the LLE.   Patient returned to room and left sitting in [facility] with call bell in reach.   Therapy Documentation Precautions:  Precautions Precautions: Fall Restrictions Weight Bearing Restrictions: No General:   Vital Signs: Therapy Vitals Temp: 98.6 F (37 C) Temp Source: Oral Pulse Rate: 60 BP: (!) 158/80 Patient Position (if appropriate): Lying Oxygen Therapy SpO2: 100 % O2 Device: Not Delivered     Balance: Standardized Balance Assessment Standardized Balance Assessment: Berg Balance Test Berg Balance Test Sit to Stand: Able to stand  independently using hands Standing Unsupported: Able to stand safely 2 minutes Sitting with Back Unsupported but Feet Supported on Floor or Stool: Able to sit safely and securely 2 minutes Stand to Sit: Controls descent by using  hands Transfers: Able to transfer safely, definite need of hands Standing Unsupported with Eyes Closed: Able to stand 10 seconds with supervision Standing Ubsupported with Feet Together: Able to place feet together independently but unable to hold for 30 seconds From Standing, Reach Forward with Outstretched Arm: Can reach forward >12 cm safely (5") From Standing Position, Pick up Object from Floor: Able to pick up shoe, needs supervision From Standing Position, Turn to Look Behind Over each Shoulder: Turn sideways only but maintains balance Turn 360 Degrees: Needs assistance while turning Standing Unsupported, Alternately Place Feet on Step/Stool: Able to complete >2 steps/needs minimal assist Standing Unsupported, One Foot in Front: Able to take small step independently and hold 30 seconds Standing on One Leg: Tries to lift leg/unable to hold 3 seconds but remains standing independently Total Score: 34   Patient demonstrates increased fall risk as noted by score of  34 /56 on Berg Balance Scale.  (<36= high risk for falls, close to 100%; 37-45 significant >80%; 46-51 moderate >50%; 52-55 lower >25%)    See Function Navigator for Current Functional Status.   Therapy/Group: Individual Therapy  [name] 03/27/2016, 5:52 AM

## 2016-03-27 ENCOUNTER — Inpatient Hospital Stay (HOSPITAL_COMMUNITY): Payer: Medicare Other | Admitting: *Deleted

## 2016-03-27 ENCOUNTER — Inpatient Hospital Stay (HOSPITAL_COMMUNITY): Payer: Medicare Other | Admitting: Physical Therapy

## 2016-03-27 ENCOUNTER — Inpatient Hospital Stay (HOSPITAL_COMMUNITY): Payer: Medicare Other

## 2016-03-27 ENCOUNTER — Inpatient Hospital Stay (HOSPITAL_COMMUNITY): Payer: Medicare Other | Admitting: Occupational Therapy

## 2016-03-27 DIAGNOSIS — I10 Essential (primary) hypertension: Secondary | ICD-10-CM

## 2016-03-27 LAB — GLUCOSE, CAPILLARY
Glucose-Capillary: 104 mg/dL — ABNORMAL HIGH (ref 65–99)
Glucose-Capillary: 95 mg/dL (ref 65–99)
Glucose-Capillary: 102 mg/dL — ABNORMAL HIGH (ref 65–99)
Glucose-Capillary: 97 mg/dL (ref 65–99)
Glucose-Capillary: 74 mg/dL (ref 65–99)

## 2016-03-27 NOTE — Progress Notes (Signed)
Subjective/Complaints: Pt without new issues, discussed BP  ROS:  No CP. SOB,N/V/D  Objective: Vital Signs: Blood pressure 138/73, pulse 61, temperature 98.4 F (36.9 C), temperature source Oral, resp. rate 18, height 5' 10"  (1.778 m), weight 93.7 kg (206 lb 9.6 oz), SpO2 98 %. No results found. Results for orders placed or performed during the hospital encounter of 03/24/16 (from the past 72 hour(s))  Glucose, capillary     Status: Abnormal   Collection Time: 03/24/16  4:41 PM  Result Value Ref Range   Glucose-Capillary 115 (H) 65 - 99 mg/dL  Glucose, capillary     Status: None   Collection Time: 03/24/16  9:46 PM  Result Value Ref Range   Glucose-Capillary 85 65 - 99 mg/dL  CBC with Differential/Platelet     Status: Abnormal   Collection Time: 03/25/16  6:39 AM  Result Value Ref Range   WBC 5.7 4.0 - 10.5 K/uL   RBC 5.49 4.22 - 5.81 MIL/uL   Hemoglobin 13.4 13.0 - 17.0 g/dL   HCT 41.3 39.0 - 52.0 %   MCV 75.2 (L) 78.0 - 100.0 fL   MCH 24.4 (L) 26.0 - 34.0 pg   MCHC 32.4 30.0 - 36.0 g/dL   RDW 14.2 11.5 - 15.5 %   Platelets 216 150 - 400 K/uL   Neutrophils Relative % 69 %   Neutro Abs 4.0 1.7 - 7.7 K/uL   Lymphocytes Relative 16 %   Lymphs Abs 0.9 0.7 - 4.0 K/uL   Monocytes Relative 11 %   Monocytes Absolute 0.6 0.1 - 1.0 K/uL   Eosinophils Relative 4 %   Eosinophils Absolute 0.2 0.0 - 0.7 K/uL   Basophils Relative 0 %   Basophils Absolute 0.0 0.0 - 0.1 K/uL  Comprehensive metabolic panel     Status: Abnormal   Collection Time: 03/25/16  6:39 AM  Result Value Ref Range   Sodium 138 135 - 145 mmol/L   Potassium 3.7 3.5 - 5.1 mmol/L   Chloride 108 101 - 111 mmol/L   CO2 21 (L) 22 - 32 mmol/L   Glucose, Bld 119 (H) 65 - 99 mg/dL   BUN 11 6 - 20 mg/dL   Creatinine, Ser 0.99 0.61 - 1.24 mg/dL   Calcium 9.2 8.9 - 10.3 mg/dL   Total Protein 7.7 6.5 - 8.1 g/dL   Albumin 3.5 3.5 - 5.0 g/dL   AST 23 15 - 41 U/L   ALT 16 (L) 17 - 63 U/L   Alkaline Phosphatase 68 38 -  126 U/L   Total Bilirubin 0.8 0.3 - 1.2 mg/dL   GFR calc non Af Amer >60 >60 mL/min   GFR calc Af Amer >60 >60 mL/min    Comment: (NOTE) The eGFR has been calculated using the CKD EPI equation. This calculation has not been validated in all clinical situations. eGFR's persistently <60 mL/min signify possible Chronic Kidney Disease.    Anion gap 9 5 - 15  Glucose, capillary     Status: Abnormal   Collection Time: 03/25/16  6:39 AM  Result Value Ref Range   Glucose-Capillary 119 (H) 65 - 99 mg/dL  Glucose, capillary     Status: Abnormal   Collection Time: 03/25/16 11:31 AM  Result Value Ref Range   Glucose-Capillary 125 (H) 65 - 99 mg/dL  Glucose, capillary     Status: None   Collection Time: 03/25/16  4:26 PM  Result Value Ref Range   Glucose-Capillary 88 65 - 99 mg/dL  Glucose, capillary     Status: None   Collection Time: 03/25/16  9:42 PM  Result Value Ref Range   Glucose-Capillary 93 65 - 99 mg/dL   Comment 1 Notify RN   Glucose, capillary     Status: None   Collection Time: 03/26/16  6:32 AM  Result Value Ref Range   Glucose-Capillary 82 65 - 99 mg/dL  Glucose, capillary     Status: Abnormal   Collection Time: 03/26/16 12:06 PM  Result Value Ref Range   Glucose-Capillary 165 (H) 65 - 99 mg/dL     HEENT: normal Cardio: RRR and no murmur Resp: RRR and no murmur GI: BS positive and NT, ND Extremity:  Pulses positive and No Edema Skin:   Intact Neuro: Alert/Oriented, Normal Sensory, Abnormal Motor 4/5 Left Delt bi, tri, grip, HF, KE, ADF and Abnormal FMC Ataxic/ dec FMC Musc/Skel:  Other no pain with AAROM Gen NAD   Assessment/Plan: 1. Functional deficits secondary to Right basal ganglia ICH which require 3+ hours per day of interdisciplinary therapy in a comprehensive inpatient rehab setting. Physiatrist is providing close team supervision and 24 hour management of active medical problems listed below. Physiatrist and rehab team continue to assess barriers to  discharge/monitor patient progress toward functional and medical goals. FIM: Function - Bathing Position: Shower Body parts bathed by patient: Right arm, Left arm, Chest, Abdomen, Front perineal area, Buttocks, Right upper leg, Left upper leg Body parts bathed by helper: Right lower leg, Left lower leg, Back Assist Level: Touching or steadying assistance(Pt > 75%)  Function- Upper Body Dressing/Undressing What is the patient wearing?: Button up shirt Button up shirt - Perfomed by patient: Thread/unthread right sleeve, Thread/unthread left sleeve, Pull shirt around back, Button/unbutton shirt Assist Level: Set up Function - Lower Body Dressing/Undressing What is the patient wearing?: Underwear, Pants, Socks, Shoes Position: Sitting EOB Underwear - Performed by patient: Thread/unthread right underwear leg, Pull underwear up/down Underwear - Performed by helper: Thread/unthread left underwear leg Pants- Performed by patient: Thread/unthread right pants leg, Pull pants up/down Pants- Performed by helper: Thread/unthread left pants leg Socks - Performed by patient: Don/doff right sock, Don/doff left sock Shoes - Performed by patient: Don/doff right shoe, Don/doff left shoe, Fasten right, Fasten left Assist for footwear: Setup, Supervision/touching assist Assist for lower body dressing: Touching or steadying assistance (Pt > 75%)  Function - Toileting Toileting steps completed by patient: Adjust clothing prior to toileting, Performs perineal hygiene, Adjust clothing after toileting Toileting Assistive Devices: Grab bar or rail Assist level: Touching or steadying assistance (Pt.75%)  Function - Air cabin crew transfer assistive device: Walker, Grab bar Assist level to toilet: Touching or steadying assistance (Pt > 75%) Assist level from toilet: Touching or steadying assistance (Pt > 75%)  Function - Chair/bed transfer Chair/bed transfer method: Stand pivot Chair/bed transfer  assist level: Touching or steadying assistance (Pt > 75%) Chair/bed transfer assistive device: Walker  Function - Locomotion: Wheelchair Will patient use wheelchair at discharge?: No Type: Manual Max wheelchair distance: 160 Assist Level: Supervision or verbal cues Assist Level: Supervision or verbal cues Assist Level: Supervision or verbal cues Turns around,maneuvers to table,bed, and toilet,negotiates 3% grade,maneuvers on rugs and over doorsills: No Function - Locomotion: Ambulation Assistive device: Walker-rolling Max distance: 130 Assist level: Supervision or verbal cues Assist level: Supervision or verbal cues Assist level: Supervision or verbal cues Walk 150 feet activity did not occur: Safety/medical concerns (endurance') Assist level: Supervision or verbal cues Assist level: Touching or steadying assistance (  Pt > 75%)  Function - Comprehension Comprehension: Auditory Comprehension assist level: Follows complex conversation/direction with no assist  Function - Expression Expression: Verbal Expression assist level: Expresses complex ideas: With no assist  Function - Social Interaction Social Interaction assist level: Interacts appropriately with others with medication or extra time (anti-anxiety, antidepressant).  Function - Problem Solving Problem solving assist level: Solves basic problems with no assist  Function - Memory Memory assist level: More than reasonable amount of time Patient normally able to recall (first 3 days only): Current season, Location of own room, Staff names and faces, That he or she is in a hospital Medical Problem List and Plan: 1.  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  DVT Prophylaxis/Anticoagulation: Mechanical: Sequential compression devices, below knee Bilateral lower extremities-start lovenox in am.  3. Pain Management:  N/A 4. Mood: LCSW to follow for evaluation and  support.  5. Neuropsych: This patient is capable of making decisions on his own behalf. 6. Skin/Wound Care: Routine pressure relief measures 7. Fluids/Electrolytes/Nutrition: Monitor I/O. BUN/Cr nl 8.HTN: monitor BP tid. Continue Norvasc, catapress, lasix, bystolic and losartan.Add prn clonidine for sys BP>160 Vitals:   03/27/16 0251 03/27/16 0500  BP: (!) 179/75 138/73  Pulse:  61  Resp:  18  Temp:  98.4 F (36.9 C)   9.T2DM: Monitor BS ac/hs. Appetite has been good. Continue metformin and glipizide.  CBG (last 3)   Recent Labs  03/25/16 2142 03/26/16 0632 03/26/16 1206  GLUCAP 93 82 165*    10 Dyslipidemia: Resume at discharge.   Labwork reviewed looks normal   LOS (Days) 3 A FACE TO FACE EVALUATION WAS PERFORMED  Najah Liverman E 03/27/2016, 7:13 AM

## 2016-03-27 NOTE — Consult Note (Signed)
Cardiology Consult    Patient ID: Richard Alvarado MRN: [id number], DOB/AGE: [birth year]   Admit date: 03/24/2016 Date of Consult: 03/27/2016  Primary Physician: Richard Bogus, MD Primary Cardiologist: Dr. Sallyanne Alvarado Requesting Provider: Dr. Letta Alvarado Reason for Consultation: Blood pressure management  Patient Profile    75 yo male with PMH of 2nd degree AVB s/p PPM, HTN, HLD, DM and CVA who was admitted for right basal ganglia hemorrhage to inpatient rehab.   Past Medical History   Past Medical History:  Diagnosis Date  . Cerebral atherosclerosis    CAROTID DOPPLER, 09/07/2009 - RIGHT AND LEFT CCAs-small-moderate amount of irregular mixed density plaque, no significant evidence of diameter reduction; RIGHT AND LEFT ICAs-moderate amount irregular mixed density plaque, no evidence of significant diameter reduction  . CVA (cerebral vascular accident) ([place]) 1994   Left brain  . History of CVA (cerebrovascular accident) 04/24/2013  . Hypertension    RENAL DOPPLER, 03/08/2009 - normal  . Hypertension in pregnancy, preeclampsia, severe 04/24/2013  . Obstructive sleep apnea 09/26/2005   AHI-19.46, during REM-36.88  . Pacemaker [employer] Jude victory, dual chamber, 2006 04/24/2013  . Pericardial effusion    2D ECHO, 03/07/2011 - EF >70%, normal  . Second degree heart block 04/24/2013    Past Surgical History:  Procedure Laterality Date  . EP IMPLANTABLE DEVICE N/A 11/21/2014   Procedure: PPM/BIV PPM Generator Changeout;  Surgeon: [name], MD;  Location: [facility];  Service: Cardiovascular;  Laterality: N/A;  . NM MYOVIEW LTD  03/31/2012   Normal study, no ECG changes, EKG negative for ischemia, post-stress EF 54%  . PACEMAKER INSERTION  05/09/2005   St. Jude [place], [place] #[id number], serial #[id number]     Allergies  No Known Allergies  History of Present Illness    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 When he stood up after the game, he felt like both ofhis legs were asleep and he had difficulty standing and walking.He had to be assisted by one of the coaches. They called 911 and EMS arrived to find initial blood pressure to 280/180. Presented to [facility].CT scan showed a small acute intracranial hemorrhage involving the right basal ganglia. Was then transferred to [facility]. They did not receive t-PA due to [place].   He was admitted inpatient under the care of neurology from 10/14-10/16 and discharged to inpatient rehab. Echo showed normal EF with no WMA, with G1DD. Home ASA was held, but blood pressure medications included amlodipine 10mg , clonidine 0.2mg , losartan 100mg  BID, and nebivolol 10mg  daily. Appears while at rehab there has been some difficulty managing his blood pressure and family requested that cardiology evaluate the patient.    In review of readings, last day or so has been much improved, with high readings noted in the evenings. [name] reports feeling well, and is working well with rehab.   Inpatient Medications    . amLODipine  10 mg Oral Daily  . atorvastatin  80 mg Oral QHS  . cloNIDine  0.2 mg Oral TID  . furosemide  40 mg Oral Daily  . glipiZIDE  10 mg Oral BID AC   And  . metFORMIN  1,000 mg Oral BID WC  . insulin aspart  0-5 Units Subcutaneous QHS  . insulin aspart  0-9 Units Subcutaneous TID WC  . losartan  100  mg Oral BID  . nebivolol  10 mg Oral Daily  . pantoprazole  40 mg Oral Daily  . senna-docusate  1 tablet Oral BID    Family History    Family History  Problem Relation Age of Onset  . CVA Mother   . Hypertension Sister   . Heart disease Maternal Grandmother     Social History    Social History   Social History  . Marital status: Married    Spouse name: N/A  . Number of children: N/A  . Years of education: N/A   Occupational History  . Not on file.   Social  History Main Topics  . Smoking status: Former Smoker    Quit date: 06/09/1999  . Smokeless tobacco: Never Used  . Alcohol use No  . Drug use: No  . Sexual activity: Not on file   Other Topics Concern  . Not on file   Social History Narrative  . No narrative on file     Review of Systems    General:  No chills, fever, night sweats or weight changes.  Cardiovascular:  No chest pain, dyspnea on exertion, edema, orthopnea, palpitations, paroxysmal nocturnal dyspnea. Dermatological: No rash, lesions/masses Respiratory: No cough, dyspnea Urologic: No hematuria, dysuria Abdominal:   No nausea, vomiting, diarrhea, bright red blood per rectum, melena, or hematemesis Neurologic:  See HPI All other systems reviewed and are otherwise negative except as noted above.  Physical Exam    Blood pressure 138/73, pulse 61, temperature 98.4 F (36.9 C), temperature source Oral, resp. rate 18, height 5\' 10"  (1.778 m), weight 206 lb 9.6 oz (93.7 kg), SpO2 98 %.  General: Pleasant older AF male, NAD Psych: Normal affect. Neuro: Alert and oriented X 3. Moves all extremities spontaneously. HEENT: Normal  Neck: Supple without bruits or JVD. Lungs:  Resp regular and unlabored, CTA. Heart: RRR no s3, s4, or murmurs. Abdomen: Soft, non-tender, non-distended, BS + x 4.  Extremities: No clubbing, cyanosis or edema. DP/PT/Radials 2+ and equal bilaterally.  Labs    Troponin (Point of Care Test) No results for input(s): TROPIPOC in the last 72 hours. No results for input(s): CKTOTAL, CKMB, TROPONINI in the last 72 hours. Lab Results  Component Value Date   WBC 5.7 03/25/2016   HGB 13.4 03/25/2016   HCT 41.3 03/25/2016   MCV 75.2 (L) 03/25/2016   PLT 216 03/25/2016    Recent Labs Lab 03/25/16 0639  NA 138  K 3.7  CL 108  CO2 21*  BUN 11  CREATININE 0.99  CALCIUM 9.2  PROT 7.7  BILITOT 0.8  ALKPHOS 68  ALT 16*  AST 23  GLUCOSE 119*   Lab Results  Component Value Date   CHOL 111  03/22/2016   HDL 34 (L) 03/22/2016   LDLCALC 68 03/22/2016   TRIG 43 03/22/2016   No results found for: [facility]   Radiology Studies    Ct Angio Head W Or Wo Contrast  Result Date: 03/23/2016 CLINICAL DATA:  75 y/o  M; follow-up of intracranial hemorrhage. EXAM: CT ANGIOGRAPHY HEAD AND NECK TECHNIQUE: Multidetector CT imaging of the head and neck was performed using the standard protocol during bolus administration of intravenous contrast. Multiplanar CT image reconstructions and MIPs were obtained to evaluate the vascular anatomy. Carotid stenosis measurements (when applicable) are obtained utilizing NASCET criteria, using the distal internal carotid diameter as the denominator. CONTRAST:  50 cc Isovue 370. COMPARISON:  03/21/2016 CT head. FINDINGS: CT HEAD FINDINGS Brain: Stable  acute hemorrhage within the right posterior basal ganglia. Stable punctate density in left putaminal possibly representing an additional hemorrhage. Stable background of advanced chronic microvascular ischemic changes and mild parenchymal volume loss for age. No evidence for new intracranial hemorrhage, large territory infarct, or focal mass effect. No extra-axial collection. Stable ventricle size. Vascular: As below. Skull: Normal. Negative for fracture or focal lesion. Sinuses: Patchy opacification of ethmoid sinuses and small mucous retention cysts within the alveolar recesses of maxillary sinuses. Otherwise negative. Orbits: Negative Review of the MIP images confirms the above findings CTA NECK FINDINGS Aortic arch: Standard branching. Imaged portion shows no evidence of aneurysm or dissection. No significant stenosis of the major arch vessel origins. Aortic atherosclerosis with mild calcification. Right carotid system: No evidence of dissection, stenosis (50% or greater) or occlusion. Mild mixed plaque of common carotid artery and extensive calcified plaque carotid bifurcation and proximal internal carotid artery. Mild  proximal ICA stenosis, 20%. Left carotid system: No evidence of dissection, stenosis (50% or greater) or occlusion. Mild mixed plaque of common carotid artery and extensive calcified plaque carotid bifurcation and proximal internal carotid artery. Mild proximal ICA stenosis, 20%. Vertebral arteries: Tortuosity and atherosclerosis of left proximal vertebral artery origin with moderate to severe stenosis. Calcified plaque of right vertebral artery origin, accurate assessment of luminal stenosis is precluded by dense calcification. Vertebral arteries are otherwise widely patent. Skeleton: No acute osseous abnormality. Severe cervical spondylosis with discogenic and facet arthropathy. Moderate to severe multilevel canal stenosis greatest at the C3-4 level where there is probable cord impingement. Multilevel moderate to severe bony foraminal narrowing greater on the right from C3 through C5. Other neck: 10 mm right thyroid lobe nodule. No mass or inflammatory process of the neck identified. Upper chest: Clear. Review of the MIP images confirms the above findings CTA HEAD FINDINGS Anterior circulation: No significant stenosis, proximal occlusion, aneurysm, or vascular malformation. Dense calcific atherosclerosis carotid siphons with mild underlying stenosis. Triangular 2 mm anteriorly directed outpouching of carotid terminus likely represents a tiny vessel infundibulum (series 502, image 257). Posterior circulation: No significant stenosis, proximal occlusion, aneurysm, or vascular malformation. Venous sinuses: As permitted by contrast timing, patent. Anatomic variants: Bilateral fetal posterior cerebral arteries. No anterior communicating artery identified, likely hypoplastic or absent. Delayed phase: No abnormal enhancement. Review of the MIP images confirms the above findings IMPRESSION: 1. Stable hemorrhage and right posterior basal ganglia and possible punctate hemorrhage within the left putamen. 2. No new acute  intracranial abnormality identified. 3. Mild 20% stenosis of proximal internal carotid arteries bilaterally. 4. Plaque and tortuosity of proximal left vertebral artery and dense calcified plaque of right vertebral artery origins with probable at least moderate underlying stenosis. 5. No significant stenosis, proximal occlusion, aneurysm, or vascular malformation of the circle Willis. Specifically no abnormal vascularity in region of right posterior basal ganglia hemorrhage. 6. Intracranial atherosclerosis with dense calcification of carotid siphons and areas of mild stenosis. Electronically Signed   By: Kristine Garbe M.D.   On: 03/23/2016 02:49   Ct Head Wo Contrast  Result Date: 03/21/2016 CLINICAL DATA:  Initial evaluation for acute weakness, ataxic gait. EXAM: CT HEAD WITHOUT CONTRAST TECHNIQUE: Contiguous axial images were obtained from the base of the skull through the vertex without intravenous contrast. COMPARISON:  Prior CT from 05/03/2005. FINDINGS: Brain: Generalized age-related cerebral atrophy. Moderately advanced chronic microvascular ischemic disease. There is an acute intraparenchymal hemorrhage centered at the right basal ganglia that measures 14 x 21 x 28 mm (estimated volume 4  cc). Mild localized vasogenic edema without significant mass effect. Hemorrhages centered at the posterior limb of the right internal capsule. No intraventricular extension. No midline shift. Punctate 6 mm hyperdensity within the left lentiform nucleus also noted, also suspicious for a possible tiny parenchymal hemorrhage (series 2, image 12). No other acute large vessel territory infarct. No mass lesion, midline shift or or mass effect. No hydrocephalus. No extra-axial fluid collection. Vascular: No hyperdense vessel. Prominent vascular calcifications present within the carotid siphons. Skull: Scalp soft tissues within normal limits.  Calvarium intact. Sinuses/Orbits: No acute abnormality about the globes in  orbits. Remote defect noted at the left lamina papyracea. Scattered mucosal thickening within the ethmoidal air cells and partially visualized maxillary sinuses. No mastoid effusion. IMPRESSION: 1. Acute intraparenchymal hemorrhage centered at the right basal ganglia measuring 14 x 21 x 20 mm (estimated volume 4 cc). Mild localized edema without significant mass effect. No intraventricular extension. 2. Probable additional punctate 6 mm parenchymal hemorrhage within the left lentiform nucleus, no associated edema. Underlying hypertensive etiology is suspected. 3. Generalized age-related cerebral atrophy with moderate chronic microvascular ischemic disease. Critical Value/emergent results were called by telephone at the time of interpretation on 03/21/2016 at 10:52 pm to Dr. Charlotte Crumb , who verbally acknowledged these results. Electronically Signed   By: Jeannine Boga M.D.   On: 03/21/2016 23:02   Ct Angio Neck W Or Wo Contrast  Result Date: 03/23/2016 CLINICAL DATA:  75 y/o  M; follow-up of intracranial hemorrhage. EXAM: CT ANGIOGRAPHY HEAD AND NECK TECHNIQUE: Multidetector CT imaging of the head and neck was performed using the standard protocol during bolus administration of intravenous contrast. Multiplanar CT image reconstructions and MIPs were obtained to evaluate the vascular anatomy. Carotid stenosis measurements (when applicable) are obtained utilizing NASCET criteria, using the distal internal carotid diameter as the denominator. CONTRAST:  50 cc Isovue 370. COMPARISON:  03/21/2016 CT head. FINDINGS: CT HEAD FINDINGS Brain: Stable acute hemorrhage within the right posterior basal ganglia. Stable punctate density in left putaminal possibly representing an additional hemorrhage. Stable background of advanced chronic microvascular ischemic changes and mild parenchymal volume loss for age. No evidence for new intracranial hemorrhage, large territory infarct, or focal mass effect. No extra-axial  collection. Stable ventricle size. Vascular: As below. Skull: Normal. Negative for fracture or focal lesion. Sinuses: Patchy opacification of ethmoid sinuses and small mucous retention cysts within the alveolar recesses of maxillary sinuses. Otherwise negative. Orbits: Negative Review of the MIP images confirms the above findings CTA NECK FINDINGS Aortic arch: Standard branching. Imaged portion shows no evidence of aneurysm or dissection. No significant stenosis of the major arch vessel origins. Aortic atherosclerosis with mild calcification. Right carotid system: No evidence of dissection, stenosis (50% or greater) or occlusion. Mild mixed plaque of common carotid artery and extensive calcified plaque carotid bifurcation and proximal internal carotid artery. Mild proximal ICA stenosis, 20%. Left carotid system: No evidence of dissection, stenosis (50% or greater) or occlusion. Mild mixed plaque of common carotid artery and extensive calcified plaque carotid bifurcation and proximal internal carotid artery. Mild proximal ICA stenosis, 20%. Vertebral arteries: Tortuosity and atherosclerosis of left proximal vertebral artery origin with moderate to severe stenosis. Calcified plaque of right vertebral artery origin, accurate assessment of luminal stenosis is precluded by dense calcification. Vertebral arteries are otherwise widely patent. Skeleton: No acute osseous abnormality. Severe cervical spondylosis with discogenic and facet arthropathy. Moderate to severe multilevel canal stenosis greatest at the C3-4 level where there is probable cord impingement. Multilevel  moderate to severe bony foraminal narrowing greater on the right from C3 through C5. Other neck: 10 mm right thyroid lobe nodule. No mass or inflammatory process of the neck identified. Upper chest: Clear. Review of the MIP images confirms the above findings CTA HEAD FINDINGS Anterior circulation: No significant stenosis, proximal occlusion, aneurysm, or  vascular malformation. Dense calcific atherosclerosis carotid siphons with mild underlying stenosis. Triangular 2 mm anteriorly directed outpouching of carotid terminus likely represents a tiny vessel infundibulum (series 502, image 257). Posterior circulation: No significant stenosis, proximal occlusion, aneurysm, or vascular malformation. Venous sinuses: As permitted by contrast timing, patent. Anatomic variants: Bilateral fetal posterior cerebral arteries. No anterior communicating artery identified, likely hypoplastic or absent. Delayed phase: No abnormal enhancement. Review of the MIP images confirms the above findings IMPRESSION: 1. Stable hemorrhage and right posterior basal ganglia and possible punctate hemorrhage within the left putamen. 2. No new acute intracranial abnormality identified. 3. Mild 20% stenosis of proximal internal carotid arteries bilaterally. 4. Plaque and tortuosity of proximal left vertebral artery and dense calcified plaque of right vertebral artery origins with probable at least moderate underlying stenosis. 5. No significant stenosis, proximal occlusion, aneurysm, or vascular malformation of the circle Willis. Specifically no abnormal vascularity in region of right posterior basal ganglia hemorrhage. 6. Intracranial atherosclerosis with dense calcification of carotid siphons and areas of mild stenosis. Electronically Signed   By: Kristine Garbe M.D.   On: 03/23/2016 02:49   Dg Chest Port 1 View  Result Date: 03/21/2016 CLINICAL DATA:  75 y/o M; sudden onset of weakness mostly in the legs with severe hypertension. EXAM: PORTABLE CHEST 1 VIEW COMPARISON:  12/12/2010 chest radiating FINDINGS: Stable cardiac silhouette within normal limits. Aortic atherosclerosis with arch calcifications. Two lead pacemaker with pacing unit changed from prior study. Clear lungs. No pneumothorax or pleural effusion. Rightward curvature and degenerative changes of thoracic spine. IMPRESSION:  No acute pulmonary process identified. Electronically Signed   By: Kristine Garbe M.D.   On: 03/21/2016 22:31    ECG & Cardiac Imaging    EKG: 10/13 SR with RBBB  Echo: 03/23/16  Study Conclusions  - Left ventricle: The cavity size was normal. Wall thickness was   increased in a pattern of moderate LVH. Systolic function was   normal. The estimated ejection fraction was in the range of 60%   to 65%. Wall motion was normal; there were no regional wall   motion abnormalities. Doppler parameters are consistent with   abnormal left ventricular relaxation (grade 1 diastolic   dysfunction). - Mitral valve: Mildly calcified leaflets . - Right ventricle: Pacer wire or catheter noted in right ventricle. - Right atrium: Pacer wire or catheter noted in right atrium. - Atrial septum: No defect or patent foramen ovale was identified.  Assessment & Plan    75 yo male with PMH of 2nd degree AVB s/p PPM, HTN, HLD, DM and CVA who was admitted for right basal ganglia hemorrhage to inpatient rehab.  1. HTN: Has been high over the past couple of days, but much improved today. Family reports they notice that blood pressure tends to run high at night? Would continue with current therapy at this time and continue to monitor.   2. ICH: Discharged by neurology to inpatient rehab, and has been doing well. Plans to f/u neurology in the outpatient setting. Home ASA was dc'ed.  Barnet Pall, NP-C Pager [phone] 03/27/2016, 2:47 PM   I have examined the patient and reviewed assessment and plan  and discussed with patient.  Agree with above as stated.  Blood pressure has been high, particularly at night.  Amlodipine was recently increased.  He is on a very high dose of losartan, but this was his home dose so will not change at this time.  Oftern, 100 mg of losartan is used as max dose.  Clonidine prn.  Increased clonidine to TID- according to home med list provided by the wife, he was  taking that twice a day at home.  Will follow and adjust meds tomorrow.  Larae Grooms

## 2016-03-27 NOTE — Progress Notes (Signed)
Physical Therapy Session Note  Patient Details  Name: Richard Alvarado MRN: [id number] Date of Birth: November 19, 1940  Today's Date: 03/27/2016 PT Individual Time: [id number] PT Individual Time Calculation (min): 60 min    Short Term Goals: Week 1:  PT Short Term Goal 1 (Week 1): =LTGs due to ELOS  Skilled Therapeutic Interventions/Progress Updates:    Session focused on community and outdoor ambulation using RW (L aircast donned) throughout hospital, on/off elevators, over thresholds, on uneven brick surface, inclines/declines, and up/down curb with supervision with 1 seated rest break, negotiating up/down 8 outdoor steps using L rail with min A progressing to close supervision with mod verbal cues for descending with LLE, LLE neuro re-ed via forced use in standing on Kinetron at 20 cm/sec with mirror for visual feedback to increase LLE control for 4 trials of 30 sec each with 30 sec seated rest and standing on Kinetron with focus on equal weight bearing through BLE keeping foot plates level with RUE support while using LLE for card matching task, supine LLE neuro re-ed for bridging x 10, hooklying hip adduction ball squeeze x 20, and heel slides with focus on controlled movement x 20, and ambulation in controlled environment without AD from therapy gym back to room with close supervision and patient demonstrating decreased stride length and increased L knee hyperextension in stance phase but no LOB. Patient left sitting in recliner with all needs in reach and family present.   Therapy Documentation Precautions:  Precautions Precautions: Fall Required Braces or Orthoses: Other Brace/Splint Other Brace/Splint: L aircast for ankle instability Restrictions Weight Bearing Restrictions: No Pain: Pain Assessment Pain Assessment: No/denies pain   See Function Navigator for Current Functional Status.   Therapy/Group: Individual Therapy  [name] 03/27/2016, 3:33 PM

## 2016-03-27 NOTE — Progress Notes (Addendum)
Physical Therapy Session Note  Patient Details  Name: Richard Alvarado MRN: [id number] Date of Birth: 11/30/1940  Today's Date: 03/27/2016 PT Individual Time: 1100-1200 PT Individual Time Calculation (min): 60 min    Short Term Goals: Week 1:  PT Short Term Goal 1 (Week 1): =LTGs due to ELOS  Skilled Therapeutic Interventions/Progress Updates:    Session focused on neuro re-ed to address balance, coordination, postural control, balance reaction training and assessment, LLE motor control and coordination during functional and dynamic tasks including gait without AD, gait while kicking ball or thorwing and catching a ball, retro gait, balance activities on compliant surface with eyes open and eyes closed (up to mod assist due to posterior and L lateral postural sway), seated LLE coordination activity to stop, roll and kick soccer ball, and administered two standardized balance assessments (see below). Pt able to gait down to therapy gym with supervision with RW with intermittent cues for LLE clearance during swing phase - rest of session without AD to challenge balance including back to room with overall min assist (occasional mod assist due to LOB). Stair negotiation with bilateral rails with steadying assist and verbal cues for L foot placement for safety but progressed challenge with 1 rail requiring min to mod assist on 6" steps.   Therapy Documentation Precautions:  Precautions Precautions: Fall Required Braces or Orthoses: Other Brace/Splint Other Brace/Splint: L aircast for ankle instability Restrictions Weight Bearing Restrictions: No    Pain: Denies pain.     Balance: Standardized Balance Assessment Standardized Balance Assessment: Timed Up and Go Test Timed Up and Go Test TUG: Normal TUG (no AD) Normal TUG (seconds): 17.3 (average of 3 trials)  Five times Sit to Stand Test (FTSS) Method: Use a straight back chair with a solid seat that is 16-18" high. Ask participant to  sit on the chair with arms folded across their chest.   Instructions: "Stand up and sit down as quickly as possible 5 times, keeping your arms folded across your chest."   Measurement: Stop timing when the participant stands the 5th time.  TIME: __15____ (in seconds)  Times > 13.6 seconds is associated with increased disability and morbidity (Guralnik, 2000) Times > 15 seconds is predictive of recurrent falls in healthy individuals aged 67 and older (Buatois, et al., 2008) Normal performance values in community dwelling individuals aged 15 and older (Bohannon, 2006): o 60-69 years: 11.4 seconds o 70-79 years: 12.6 seconds o 80-89 years: 14.8 seconds  MCID: ? 2.3 seconds for Vestibular Disorders Mariah Milling, 2006)   See Function Navigator for Current Functional Status.   Therapy/Group: Individual Therapy and Co-Treatment with TR [id number])  Juanna Cao, PT, DPT  03/27/2016, 12:09 PM

## 2016-03-27 NOTE — Progress Notes (Signed)
Recreational Therapy Session Note  Patient Details  Name: Richard Alvarado MRN: [id number] Date of Birth: June 27, 1940 Today's Date: 03/27/2016  Pain: no c/o Skilled Therapeutic Interventions/Progress Updates: Session focused on activity tolerance, ambulation with RW, ambulation without RW forward and backwards and while kicking and then tossing/catching a ball during co-treat with PT.  Pt required min assist throughout.  Therapy/Group: Co-Treatment  Jairus Tonne 03/27/2016, 12:12 PM

## 2016-03-27 NOTE — Progress Notes (Signed)
Social Work Patient ID: Richard Alvarado, male   DOB: [birth year], 75 y.o.   MRN: [id number]  Met with pt and spoke with wife via telephone to discuss team conference goals mod/i level and target discharge date 10/26. She is concerned about his BP and making sure if is consistent and within normal limits. She wants to prevent another stroke. Asked her to come in and attend therapies with pt to see his progress. She plans too. She will be the one with him at home providing supervision level if needed. Will continue to work on discharge needs.

## 2016-03-27 NOTE — Progress Notes (Signed)
Occupational Therapy Session Note  Patient Details  Name: Richard Alvarado MRN: [id number] Date of Birth: [birth year]  Today's Date: 03/27/2016 OT Individual Time: [id number] OT Individual Time Calculation (min): 73 min     Short Term Goals:Week 1:  OT Short Term Goal 1 (Week 1): STGs=LTGs secondary to estimated LOS  Skilled Therapeutic Interventions/Progress Updates:    Treatment session with focus on functional mobility, standing balance, and LUE/LLE NMR.  Pt ambulated around room with RW to obtain clothing and items prior to bathing in room shower.  Pt required min cues to attend to LLE during ambulation and min guard during mobility around obstacles and over threshold into bathroom.  Pt completed bathing at sit > stand level in room shower with min guard when standing and cues to attend to LLE prior to standing to ensure proper placement (as ankle tends to roll in standing with no awareness).  Dressing completed at sit > stand level without assist, except for assist to don air cast to Lt ankle to improve support/stability. Noted mild impairments with LUE gross motor coordination when reaching for items to complete grooming tasks.  Ambulated to therapy gym with RW with min guard.  Engaged in dynamic standing activity with focus on weight shifting through LLE while reaching with LUE.  Completed horse shoe reaching task in standing and crossing midline to place items on target with min guard for balance.  Utilized stacking cups in standing to challenge motor control progressing to standing on foam surface, pt required cues to attend to LLE to position correctly on foam.  Engaged in ball toss in sitting with pt tossing ball back and forth between hands, bouncing alternating hands, and bouncing with Lt hand only with decreased coordination.  Able to slow down speed with improved coordination and motor control and slower pace, discussed carryover into functional tasks. Horse shoe reaching in standing on  foam, stacking cups, ball toss to self and with therapist.  Discussed speed and control.  Therapy Documentation Precautions:  Precautions Precautions: Fall Restrictions Weight Bearing Restrictions: No General:   Vital Signs: Therapy Vitals Temp: 98.4 F (36.9 C) Temp Source: Oral Pulse Rate: 61 Resp: 18 BP: 138/73 Patient Position (if appropriate): Lying Oxygen Therapy SpO2: 98 % O2 Device: Not Delivered Pain:   ADL:   Exercises:   Other Treatments:    See Function Navigator for Current Functional Status.   Therapy/Group: Individual Therapy  Simonne Come 03/27/2016, 8:31 AM

## 2016-03-27 NOTE — IPOC Note (Signed)
Overall Plan of Care [facility]) Patient Details Name: Richard Alvarado MRN: [id number] DOB: 10/26/1940  Admitting Diagnosis: [place]  Hospital Problems: Active Problems:   Nontraumatic acute hemorrhage of basal ganglia ([place])   Ataxia     Functional Problem List: Nursing Edema, Endurance, Medication Management, Sensory  PT Balance, Motor, Perception, Safety  OT Balance, Endurance, Motor, Safety  SLP    TR Endurance, Motor, Safety       Basic ADL's: OT Grooming, Bathing, Dressing, Toileting     Advanced  ADL's: OT Simple Meal Preparation, Laundry     Transfers: PT Bed Mobility, Bed to Chair, Car, [employer], Futures trader, Tub/Shower     Locomotion: PT Stairs, Ambulation     Additional Impairments: OT None  SLP        TR      Anticipated Outcomes Item Anticipated Outcome  Self Feeding n/a  Swallowing      Basic self-care  mod I   Toileting  Mod I    Bathroom Transfers mod I - toilet, supervision - shower  Bowel/Bladder  Mod I  Transfers  mod I  Locomotion  mod I ambulatory with LRAD  Communication     Cognition     Pain  n/a  Safety/Judgment  Mod I   Therapy Plan: PT Intensity: Minimum of 1-2 x/day ,45 to 90 minutes PT Frequency: 5 out of 7 days PT Duration Estimated Length of Stay: 10-12 OT Intensity: Minimum of 1-2 x/day, 45 to 90 minutes OT Frequency: 5 out of 7 days OT Duration/Estimated Length of Stay: 10 days         Team Interventions: Nursing Interventions Patient/Family Education, Medication Management, Disease Management/Prevention  PT interventions Ambulation/gait training, Community reintegration, DME/adaptive equipment instruction, Neuromuscular re-education, Psychosocial support, Stair training, UE/LE Strength taining/ROM, UE/LE Coordination activities, Therapeutic Activities, Training and development officer, Discharge planning, Wheelchair propulsion/positioning, Pain management, Functional electrical stimulation, Functional mobility  training, Patient/family education, Splinting/orthotics, Therapeutic Exercise  OT Interventions Training and development officer, Community reintegration, Discharge planning, Functional mobility training, Neuromuscular re-education, Self Care/advanced ADL retraining, Therapeutic Exercise, Wheelchair propulsion/positioning, DME/adaptive equipment instruction, UE/LE Strength taining/ROM, Patient/family education, UE/LE Coordination activities, Psychosocial support, Therapeutic Activities  SLP Interventions    TR Interventions Adaptive equipment instruction, 1:1 session, Training and development officer, Functional mobility training, Academic librarian, Barrister's clerk education, Therapeutic activities, Recreation/leisure participation, Therapeutic exercise, UE/LE Coordination activities  SW/CM Interventions Discharge Planning, Barrister's clerk, Patient/Family Education    Team Discharge Planning: Destination: PT-Home ,OT- Home , SLP-  Projected Follow-up: PT-Outpatient PT, OT-  Outpatient OT, SLP-  Projected Equipment Needs: PT-To be determined, OT- To be determined, SLP-  Equipment Details: PT- , OT-  Patient/family involved in discharge planning: PT- Patient,  OT-Patient, SLP-   MD ELOS: 7-10d Medical Rehab Prognosis:  Good Assessment: 75 year old male with history of HTN, OSA,  PPM, prior CVA 2014;  who was admitted on 03/21/16 with development of BLE weakness and difficulty standing/walking while at football game. UDS negative.  He was taken to [facility] where CT head done revealing small acute R-BG hemorrhage and elevated BP. He was started on cardene drip and transferred to [facility] for treatment/evalution. CTA head/neck done revealing stable acute punctate right posterior BG hemorrhage, stable advanced microvascular disease, mild 20% stenosis proximal ICA, likely moderate stenosis L-VA and intracranial atherosclerosis.  2D echo done revealing EF 60-65% with no wall abnormality and mild calcification of  MV.    See Team Conference Notes for weekly updates to the plan of care   Now requiring  24/7 Rehab RN,MD, as well as CIR level PT, OT and SLP.  Treatment team will focus on ADLs and mobility with goals set at modified independent

## 2016-03-28 ENCOUNTER — Inpatient Hospital Stay (HOSPITAL_COMMUNITY): Payer: Medicare Other

## 2016-03-28 ENCOUNTER — Inpatient Hospital Stay (HOSPITAL_COMMUNITY): Payer: Medicare Other | Admitting: Occupational Therapy

## 2016-03-28 ENCOUNTER — Encounter (HOSPITAL_COMMUNITY): Payer: Self-pay

## 2016-03-28 DIAGNOSIS — I10 Essential (primary) hypertension: Secondary | ICD-10-CM

## 2016-03-28 LAB — GLUCOSE, CAPILLARY
Glucose-Capillary: 135 mg/dL — ABNORMAL HIGH (ref 65–99)
Glucose-Capillary: 73 mg/dL (ref 65–99)
Glucose-Capillary: 75 mg/dL (ref 65–99)
Glucose-Capillary: 101 mg/dL — ABNORMAL HIGH (ref 65–99)
Glucose-Capillary: 104 mg/dL — ABNORMAL HIGH (ref 65–99)

## 2016-03-28 NOTE — Progress Notes (Signed)
Physical Therapy Session Note  Patient Details  Name: Richard Alvarado MRN: [id number] Date of Birth: [birth year]  Today's Date: 03/28/2016 PT Individual Time: 1100-1200 PT Individual Time Calculation (min): 60 min    Short Term Goals: Week 1:  PT Short Term Goal 1 (Week 1): =LTGs due to ELOS  Skilled Therapeutic Interventions/Progress Updates:    Session focused on gait training without AD with focus on L step length and knee control, neuro re-ed for LLE/LUE motor control, coordination, balance, address ataxia and postural control, transfers, stair negotiation, and reciprocal movement pattern retraining on Nustep (x 10 min on level 5 with BLE and LUE). Neuro re-ed activities including blocked practice sit <> stands with focus on pelvic dissociation, L knee control, and equal weightbearing (on level surface and with bias to L side with R foot on 2" block), trunk elongation and shortening while performing functional reaching tasks to manipulate cups, and toe taps on a cone (10 x 2 sets on each side) with up to mod assist to maintain balance and weightshifting. Pt requires cues for attention to LLE (especially on stairs) during functional mobility due to decreased proprioception and ataxia. Pt continuing to make excellent progress and working hard in therapy sessions.    Therapy Documentation Precautions:  Precautions Precautions: Fall Required Braces or Orthoses: Other Brace/Splint Other Brace/Splint: L aircast for ankle instability Restrictions Weight Bearing Restrictions: No Pain:  Denies pain.    See Function Navigator for Current Functional Status.   Therapy/Group: Individual Therapy  Canary Brim Ivory Broad, PT, DPT  03/28/2016, 12:08 PM

## 2016-03-28 NOTE — Progress Notes (Signed)
Occupational Therapy Session Note  Patient Details  Name: Richard Alvarado MRN: [id number] Date of Birth: September 09, 1940  Today's Date: 03/28/2016 OT Individual Time: 1345-1430 OT Individual Time Calculation (min): 45 min     Short Term Goals:Week 1:  OT Short Term Goal 1 (Week 1): STGs=LTGs secondary to estimated LOS  Skilled Therapeutic Interventions/Progress Updates:    Pt seen this session to address LLE coordination/ strength. Pt ambulated from room to gym with close S with RW.  In gym worked on: Sit >< stand holding large ball, reaching ball over head, leaning side to side with ball overhead, twisting ball from hip to hip with each sit to stand.   needed facilitation to prevent knee from hyperextending, cues to place feet right under hips Standing at side of parallel bars worked on lateral leg lifts, coordination tapping, heel lifts, toe lifts, and stabilization exercises with one leg stands.  Facilitation to prevent hip push to the L.  Supine bridges with B legs and with L leg only (crossing R leg over L) Pt sat up to edge of mat for 1 minute then ambulated back to room with RW with S. Pt resting in recliner with family in the room.   Therapy Documentation Precautions:  Precautions Precautions: Fall Required Braces or Orthoses: Other Brace/Splint Other Brace/Splint: L aircast for ankle instability Restrictions Weight Bearing Restrictions: No    Vital Signs: Therapy Vitals Temp: 98 F (36.7 C) Temp Source: Oral Pulse Rate: 61 Resp: 18 BP: (!) 128/56 Patient Position (if appropriate): Sitting Oxygen Therapy SpO2: 100 % O2 Device: Not Delivered Pain: Pain Assessment Pain Assessment: No/denies pain ADL:   See Function Navigator for Current Functional Status.   Therapy/Group: Individual Therapy  [name] 03/28/2016, 2:50 PM

## 2016-03-28 NOTE — Progress Notes (Signed)
Occupational Therapy Session Note  Patient Details  Name: ISAHIA BROICH MRN: [id number] Date of Birth: 04/04/1941  Today's Date: 03/28/2016 OT Individual Time: [phone] OT Individual Time Calculation (min): 88 min     Short Term Goals: Week 1:  OT Short Term Goal 1 (Week 1): STGs=LTGs secondary to estimated LOS  Skilled Therapeutic Interventions/Progress Updates:    Upon entering the room, pt supine in bed with no c/o pain this session. Pt ambulated in room without use of AD and needing steady assistance while obtaining all clothing items. Pt bathing at shower level from tub transfer bench with lateral leans to wash buttocks and steady assistance for safety. Pt returning to sit on EOB for dressing and needing assistance to don L LE air cast. OT providing education on how to place and secure. Pt was able to fasten straps and then place foot with cast into shoe without difficulty. Pt standing at sink for grooming tasks with close supervision for safety. Pt returned to sit on EOB, and OT provided paper handout with fine motor coordination exercises for L UE. Pt returned exercises with min verbal cues for proper technique. Pt having most difficulty with palmar translation tasks with small manipulatives. Pt transferred with steady assistance to sit in recliner chair at end of session. Call bell and all needed items within reach upon exiting the room.   Therapy Documentation Precautions:  Precautions Precautions: Fall Required Braces or Orthoses: Other Brace/Splint Other Brace/Splint: L aircast for ankle instability Restrictions Weight Bearing Restrictions: No  See Function Navigator for Current Functional Status.   Therapy/Group: Individual Therapy  Phineas Semen 03/28/2016, 12:48 PM

## 2016-03-28 NOTE — Progress Notes (Signed)
Patient Name: Richard Alvarado Date of Encounter: 03/28/2016  Primary Cardiologist: Dr. [facility] Problem List     Active Problems:   Nontraumatic acute hemorrhage of basal ganglia (H[facility])   Ataxia     Subjective   Feels well.  BP was better last night than before.  Inpatient Medications    Scheduled Meds: . amLODipine  10 mg Oral Daily  . atorvastatin  80 mg Oral QHS  . cloNIDine  0.2 mg Oral TID  . furosemide  40 mg Oral Daily  . glipiZIDE  10 mg Oral BID AC   And  . metFORMIN  1,000 mg Oral BID WC  . insulin aspart  0-5 Units Subcutaneous QHS  . insulin aspart  0-9 Units Subcutaneous TID WC  . losartan  100 mg Oral BID  . nebivolol  10 mg Oral Daily  . pantoprazole  40 mg Oral Daily  . senna-docusate  1 tablet Oral BID   Continuous Infusions:   PRN Meds: acetaminophen, alum & mag hydroxide-simeth, bisacodyl, cloNIDine, diphenhydrAMINE, guaiFENesin-dextromethorphan, prochlorperazine **OR** prochlorperazine **OR** prochlorperazine, sodium phosphate, traZODone   Vital Signs    Vitals:   03/27/16 1405 03/27/16 2105 03/28/16 0538 03/28/16 0834  BP: 128/67 (!) 161/85 131/61 136/65  Pulse: (!) 59  (!) 58   Resp: 18  18   Temp: 98.3 F (36.8 C)  98.6 F (37 C)   TempSrc: Oral  Oral   SpO2: 99%  100%   Weight:      Height:        Intake/Output Summary (Last 24 hours) at 03/28/16 1047 Last data filed at 03/28/16 0753  Gross per 24 hour  Intake             1080 ml  Output                0 ml  Net             1080 ml   Filed Weights   03/24/16 1505 03/26/16 0500  Weight: 91.6 kg (202 lb) 93.7 kg (206 lb 9.6 oz)    Physical Exam   GEN: Well nourished, well developed, in no acute distress.  HEENT: Grossly normal.  Neck: Supple, no JVD, carotid bruits, or masses. Cardiac: RRR, no murmurs, rubs, or gallops. No clubbing, cyanosis, edema.  Radials/DP/PT 2+ and equal bilaterally.  Respiratory:  Respirations regular and unlabored, clear to  auscultation bilaterally. GI: Soft, nontender, nondistended, BS + x 4. MS: no deformity or atrophy. Skin: warm and dry, no rash. Neuro:  Strength and sensation are intact. Psych: AAOx3.  Normal affect.  Labs    CBC No results for input(s): WBC, NEUTROABS, HGB, HCT, MCV, PLT in the last 72 hours. Basic Metabolic Panel No results for input(s): NA, K, CL, CO2, GLUCOSE, BUN, CREATININE, CALCIUM, MG, PHOS in the last 72 hours. Liver Function Tests No results for input(s): AST, ALT, ALKPHOS, BILITOT, PROT, ALBUMIN in the last 72 hours. No results for input(s): LIPASE, AMYLASE in the last 72 hours. Cardiac Enzymes No results for input(s): CKTOTAL, CKMB, CKMBINDEX, TROPONINI in the last 72 hours. BNP Invalid input(s): POCBNP D-Dimer No results for input(s): DDIMER in the last 72 hours. Hemoglobin A1C No results for input(s): HGBA1C in the last 72 hours. Fasting Lipid Panel No results for input(s): CHOL, HDL, LDLCALC, TRIG, CHOLHDL, LDLDIRECT in the last 72 hours. Thyroid Function Tests No results for input(s): TSH, T4TOTAL, T3FREE, THYROIDAB in the last 72 hours.  Invalid input(s):  FREET3  Telemetry      ECG    None recent  Radiology    No results found.  Cardiac Studies     Patient Profile     75 y/o with hemorrhagic CVA  Assessment & Plan    Blood pressure better controled last night after increased amlodipine.  Continue current meds. Amlodipine full effect may not occur for a few days.  Please call over the weekend if questions arise.    Signed, Larae Grooms, MD  03/28/2016, 10:47 AM

## 2016-03-28 NOTE — Progress Notes (Signed)
Subjective/Complaints: Appreciate cardiology consult, no med changes thus far No issues overnite ROS:  No CP. SOB,N/V/D  Objective: Vital Signs: Blood pressure 131/61, pulse (!) 58, temperature 98.6 F (37 C), temperature source Oral, resp. rate 18, height 5\' 10"  (1.778 m), weight 93.7 kg (206 lb 9.6 oz), SpO2 100 %. No results found. Results for orders placed or performed during the hospital encounter of 03/24/16 (from the past 72 hour(s))  Glucose, capillary     Status: Abnormal   Collection Time: 03/25/16 11:31 AM  Result Value Ref Range   Glucose-Capillary 125 (H) 65 - 99 mg/dL  Glucose, capillary     Status: None   Collection Time: 03/25/16  4:26 PM  Result Value Ref Range   Glucose-Capillary 88 65 - 99 mg/dL  Glucose, capillary     Status: None   Collection Time: 03/25/16  9:42 PM  Result Value Ref Range   Glucose-Capillary 93 65 - 99 mg/dL   Comment 1 Notify RN   Glucose, capillary     Status: None   Collection Time: 03/26/16  6:32 AM  Result Value Ref Range   Glucose-Capillary 82 65 - 99 mg/dL  Glucose, capillary     Status: Abnormal   Collection Time: 03/26/16 12:06 PM  Result Value Ref Range   Glucose-Capillary 165 (H) 65 - 99 mg/dL  Glucose, capillary     Status: Abnormal   Collection Time: 03/26/16  4:35 PM  Result Value Ref Range   Glucose-Capillary 104 (H) 65 - 99 mg/dL  Glucose, capillary     Status: None   Collection Time: 03/26/16  9:08 PM  Result Value Ref Range   Glucose-Capillary 95 65 - 99 mg/dL   Comment 1 Notify RN   Glucose, capillary     Status: Abnormal   Collection Time: 03/27/16  7:02 AM  Result Value Ref Range   Glucose-Capillary 102 (H) 65 - 99 mg/dL   Comment 1 Notify RN   Glucose, capillary     Status: None   Collection Time: 03/27/16 12:03 PM  Result Value Ref Range   Glucose-Capillary 97 65 - 99 mg/dL  Glucose, capillary     Status: None   Collection Time: 03/27/16  8:30 PM  Result Value Ref Range   Glucose-Capillary 74 65 -  99 mg/dL   Comment 1 Notify RN   Glucose, capillary     Status: None   Collection Time: 03/28/16  6:31 AM  Result Value Ref Range   Glucose-Capillary 73 65 - 99 mg/dL   Comment 1 Notify RN      HEENT: normal Cardio: RRR and no murmur Resp: RRR and no murmur GI: BS positive and NT, ND Extremity:  Pulses positive and No Edema Skin:   Intact Neuro: Alert/Oriented, Normal Sensory, Abnormal Motor 4/5 Left Delt bi, tri, grip, HF, KE, ADF and Abnormal FMC Ataxic/ dec FMC Musc/Skel:  Other no pain with AAROM Gen NAD   Assessment/Plan: 1. Functional deficits secondary to Right basal ganglia ICH which require 3+ hours per day of interdisciplinary therapy in a comprehensive inpatient rehab setting. Physiatrist is providing close team supervision and 24 hour management of active medical problems listed below. Physiatrist and rehab team continue to assess barriers to discharge/monitor patient progress toward functional and medical goals. FIM: Function - Bathing Position: Shower Body parts bathed by patient: Right arm, Left arm, Chest, Abdomen, Front perineal area, Buttocks, Right upper leg, Left upper leg, Right lower leg, Left lower leg Body parts bathed  by helper: Back Assist Level: Touching or steadying assistance(Pt > 75%)  Function- Upper Body Dressing/Undressing What is the patient wearing?: Pull over shirt/dress, Button up shirt Pull over shirt/dress - Perfomed by patient: Thread/unthread right sleeve, Thread/unthread left sleeve, Put head through opening, Pull shirt over trunk Button up shirt - Perfomed by patient: Thread/unthread right sleeve, Thread/unthread left sleeve, Pull shirt around back, Button/unbutton shirt Assist Level: Set up Set up : To obtain clothing/put away Function - Lower Body Dressing/Undressing What is the patient wearing?: Underwear, Pants, Socks, Shoes Position:  (recliner) Underwear - Performed by patient: Thread/unthread right underwear leg,  Thread/unthread left underwear leg, Pull underwear up/down Underwear - Performed by helper: Thread/unthread left underwear leg Pants- Performed by patient: Thread/unthread right pants leg, Thread/unthread left pants leg, Pull pants up/down Pants- Performed by helper: Thread/unthread left pants leg Socks - Performed by patient: Don/doff right sock, Don/doff left sock Shoes - Performed by patient: Don/doff right shoe, Fasten right, Fasten left Shoes - Performed by helper: Don/doff left shoe Assist for footwear: Supervision/touching assist, Setup Assist for lower body dressing: Supervision or verbal cues, Set up, Touching or steadying assistance (Pt > 75%) Set up : To obtain clothing/put away (don ankle support)  Function - Toileting Toileting steps completed by patient: Adjust clothing prior to toileting, Performs perineal hygiene, Adjust clothing after toileting Toileting steps completed by helper: Performs perineal hygiene, Adjust clothing after toileting (per [employer], NT assist past bm) Toileting Assistive Devices: Grab bar or rail Assist level: Touching or steadying assistance (Pt.75%)  Function - Air cabin crew transfer assistive device: Grab bar Assist level to toilet: Touching or steadying assistance (Pt > 75%) Assist level from toilet: Touching or steadying assistance (Pt > 75%) Assist level to bedside commode (at bedside): Maximal assist (Pt 25 - 49%/lift and lower) (per [employer], NT) Assist level from bedside commode (at bedside): Maximal assist (Pt 25 - 49%/lift and lower) (per [employer], NT)  Function - Chair/bed transfer Chair/bed transfer method: Ambulatory Chair/bed transfer assist level: Supervision or verbal cues Chair/bed transfer assistive device: Environmental consultant, Armrests  Function - Locomotion: Wheelchair Will patient use wheelchair at discharge?: No Type: Manual Max wheelchair distance: 160 Assist Level: Supervision or verbal cues Assist Level:  Supervision or verbal cues Assist Level: Supervision or verbal cues Turns around,maneuvers to table,bed, and toilet,negotiates 3% grade,maneuvers on rugs and over doorsills: No Function - Locomotion: Ambulation Assistive device: Walker-rolling Max distance: 500 ft Assist level: Supervision or verbal cues Assist level: Supervision or verbal cues Assist level: Supervision or verbal cues Walk 150 feet activity did not occur: Safety/medical concerns (endurance') Assist level: Supervision or verbal cues Assist level: Supervision or verbal cues  Function - Comprehension Comprehension: Auditory Comprehension assist level: Follows complex conversation/direction with no assist  Function - Expression Expression: Verbal Expression assist level: Expresses complex ideas: With no assist  Function - Social Interaction Social Interaction assist level: Interacts appropriately with others - No medications needed.  Function - Problem Solving Problem solving assist level: Solves complex problems: Recognizes & self-corrects  Function - Memory Memory assist level: Complete Independence: No helper Patient normally able to recall (first 3 days only): Current season, Location of own room, Staff names and faces, That he or she is in a hospital Medical Problem List and Plan: 1.  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  DVT Prophylaxis/Anticoagulation: Mechanical: Sequential compression devices,  below knee Bilateral lower extremities-start lovenox in am.  3. Pain Management:  N/A 4. Mood: LCSW to follow for evaluation and support.  5. Neuropsych: This patient is capable of making decisions on his own behalf. 6. Skin/Wound Care: Routine pressure relief measures 7. Fluids/Electrolytes/Nutrition: Monitor I/O. BUN/Cr nl 8.HTN: monitor BP tid. Continue Norvasc, catapress, lasix, bystolic and losartan.Add prn clonidine for sys BP>160, Cardiology to  make further adjustments as necessary Vitals:   03/27/16 2105 03/28/16 0538  BP: (!) 161/85 131/61  Pulse:  (!) 58  Resp:  18  Temp:  98.6 F (37 C)   9.T2DM: Monitor BS ac/hs. Appetite has been good. Continue metformin and glipizide.  CBG (last 3)   Recent Labs  03/27/16 1203 03/27/16 2030 03/28/16 0631  GLUCAP 97 74 73    10 Dyslipidemia: Resume at discharge.   Labwork reviewed looks normal   LOS (Days) 4 A FACE TO FACE EVALUATION WAS PERFORMED  Rinda Rollyson E 03/28/2016, 7:19 AM

## 2016-03-29 DIAGNOSIS — E1149 Type 2 diabetes mellitus with other diabetic neurological complication: Secondary | ICD-10-CM

## 2016-03-29 LAB — GLUCOSE, CAPILLARY
Glucose-Capillary: 71 mg/dL (ref 65–99)
Glucose-Capillary: 80 mg/dL (ref 65–99)
Glucose-Capillary: 86 mg/dL (ref 65–99)
Glucose-Capillary: 66 mg/dL (ref 65–99)
Glucose-Capillary: 68 mg/dL (ref 65–99)
Glucose-Capillary: 71 mg/dL (ref 65–99)
Glucose-Capillary: 72 mg/dL (ref 65–99)

## 2016-03-29 NOTE — Progress Notes (Signed)
Subjective/Complaints: Pt seen sitting up in bed watching TV.  He is doing well.    ROS:  Denies CP, SOB, N/V/D  Objective: Vital Signs: Blood pressure (!) 152/77, pulse 62, temperature 98.8 F (37.1 C), temperature source Oral, resp. rate 18, height 5\' 10"  (1.778 m), weight 93.7 kg (206 lb 9.6 oz), SpO2 97 %. No results found. Results for orders placed or performed during the hospital encounter of 03/24/16 (from the past 72 hour(s))  Glucose, capillary     Status: Abnormal   Collection Time: 03/26/16 12:06 PM  Result Value Ref Range   Glucose-Capillary 165 (H) 65 - 99 mg/dL  Glucose, capillary     Status: Abnormal   Collection Time: 03/26/16  4:35 PM  Result Value Ref Range   Glucose-Capillary 104 (H) 65 - 99 mg/dL  Glucose, capillary     Status: None   Collection Time: 03/26/16  9:08 PM  Result Value Ref Range   Glucose-Capillary 95 65 - 99 mg/dL   Comment 1 Notify RN   Glucose, capillary     Status: Abnormal   Collection Time: 03/27/16  7:02 AM  Result Value Ref Range   Glucose-Capillary 102 (H) 65 - 99 mg/dL   Comment 1 Notify RN   Glucose, capillary     Status: None   Collection Time: 03/27/16 12:03 PM  Result Value Ref Range   Glucose-Capillary 97 65 - 99 mg/dL  Glucose, capillary     Status: Abnormal   Collection Time: 03/27/16  4:33 PM  Result Value Ref Range   Glucose-Capillary 135 (H) 65 - 99 mg/dL  Glucose, capillary     Status: None   Collection Time: 03/27/16  8:30 PM  Result Value Ref Range   Glucose-Capillary 74 65 - 99 mg/dL   Comment 1 Notify RN   Glucose, capillary     Status: None   Collection Time: 03/28/16  6:31 AM  Result Value Ref Range   Glucose-Capillary 73 65 - 99 mg/dL   Comment 1 Notify RN   Glucose, capillary     Status: None   Collection Time: 03/28/16 12:00 PM  Result Value Ref Range   Glucose-Capillary 75 65 - 99 mg/dL  Glucose, capillary     Status: Abnormal   Collection Time: 03/28/16  4:30 PM  Result Value Ref Range   Glucose-Capillary 101 (H) 65 - 99 mg/dL  Glucose, capillary     Status: Abnormal   Collection Time: 03/28/16  8:37 PM  Result Value Ref Range   Glucose-Capillary 104 (H) 65 - 99 mg/dL  Glucose, capillary     Status: None   Collection Time: 03/29/16  6:50 AM  Result Value Ref Range   Glucose-Capillary 71 65 - 99 mg/dL     HEENT: normocephalic, atraumatic.  Cardio: RRR and no murmur Resp: B/l CTA GI: BS positive and NT, ND Skin:   Intact. Warm and dry. Neuro: Alert/Oriented,  Motor: 4/5 Left Delt bi, tri, grip, HF, KE, ADF  Musc/Skel:  No edema. No tenderness. Gen NAD. Vital signs reviewed.    Assessment/Plan: 1. Functional deficits secondary to Right basal ganglia ICH which require 3+ hours per day of interdisciplinary therapy in a comprehensive inpatient rehab setting. Physiatrist is providing close team supervision and 24 hour management of active medical problems listed below. Physiatrist and rehab team continue to assess barriers to discharge/monitor patient progress toward functional and medical goals. FIM: Function - Bathing Position: Shower Body parts bathed by patient: Right arm, Left  arm, Chest, Abdomen, Front perineal area, Buttocks, Right upper leg, Left upper leg, Right lower leg, Left lower leg Body parts bathed by helper: Back Assist Level: Touching or steadying assistance(Pt > 75%)  Function- Upper Body Dressing/Undressing What is the patient wearing?: Pull over shirt/dress Pull over shirt/dress - Perfomed by patient: Thread/unthread right sleeve, Thread/unthread left sleeve, Put head through opening, Pull shirt over trunk Button up shirt - Perfomed by patient: Thread/unthread right sleeve, Thread/unthread left sleeve, Pull shirt around back, Button/unbutton shirt Assist Level: Supervision or verbal cues Set up : To obtain clothing/put away Function - Lower Body Dressing/Undressing What is the patient wearing?: Underwear, Pants, Socks, Shoes Position: Sitting  EOB Underwear - Performed by patient: Thread/unthread right underwear leg, Thread/unthread left underwear leg, Pull underwear up/down Underwear - Performed by helper: Thread/unthread left underwear leg Pants- Performed by patient: Thread/unthread right pants leg, Thread/unthread left pants leg, Pull pants up/down Pants- Performed by helper: Thread/unthread left pants leg Socks - Performed by patient: Don/doff right sock, Don/doff left sock Shoes - Performed by patient: Don/doff right shoe, Fasten right, Fasten left Shoes - Performed by helper: Don/doff left shoe Assist for footwear: Supervision/touching assist, Setup Assist for lower body dressing: Supervision or verbal cues, Set up, Touching or steadying assistance (Pt > 75%) Set up : To obtain clothing/put away (don ankle support)  Function - Toileting Toileting steps completed by patient: Adjust clothing prior to toileting, Performs perineal hygiene, Adjust clothing after toileting Toileting steps completed by helper: Performs perineal hygiene, Adjust clothing after toileting (per [employer], NT assist past bm) Toileting Assistive Devices: Grab bar or rail Assist level: Supervision or verbal cues  Function - Air cabin crew transfer assistive device: Grab bar Assist level to toilet: Touching or steadying assistance (Pt > 75%) Assist level from toilet: Touching or steadying assistance (Pt > 75%) Assist level to bedside commode (at bedside): Maximal assist (Pt 25 - 49%/lift and lower) (per [employer], NT) Assist level from bedside commode (at bedside): Maximal assist (Pt 25 - 49%/lift and lower) (per [employer], NT)  Function - Chair/bed transfer Chair/bed transfer method: Ambulatory Chair/bed transfer assist level: Touching or steadying assistance (Pt > 75%) Chair/bed transfer assistive device: Environmental consultant, Armrests  Function - Locomotion: Wheelchair Will patient use wheelchair at discharge?: No Type: Manual Max  wheelchair distance: 160 Assist Level: Supervision or verbal cues Assist Level: Supervision or verbal cues Assist Level: Supervision or verbal cues Turns around,maneuvers to table,bed, and toilet,negotiates 3% grade,maneuvers on rugs and over doorsills: No Function - Locomotion: Ambulation Assistive device: No device Max distance: 160' Assist level: Touching or steadying assistance (Pt > 75%) Assist level: Touching or steadying assistance (Pt > 75%) Assist level: Touching or steadying assistance (Pt > 75%) Walk 150 feet activity did not occur: Safety/medical concerns (endurance') Assist level: Touching or steadying assistance (Pt > 75%) Assist level: Supervision or verbal cues  Function - Comprehension Comprehension: Auditory Comprehension assist level: Follows complex conversation/direction with no assist  Function - Expression Expression: Verbal Expression assist level: Expresses complex ideas: With no assist  Function - Social Interaction Social Interaction assist level: Interacts appropriately with others - No medications needed.  Function - Problem Solving Problem solving assist level: Solves complex problems: Recognizes & self-corrects  Function - Memory Memory assist level: Complete Independence: No helper Patient normally able to recall (first 3 days only): Current season, Location of own room, Staff names and faces, That he or she is in a hospital Medical Problem List and Plan: 1.  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  DVT Prophylaxis/Anticoagulation: Mechanical: Sequential compression devices, below knee Bilateral lower extremities-started lovenox  3. Pain Management:  N/A 4. Mood: LCSW to follow for evaluation and support.  5. Neuropsych: This patient is capable of making decisions on his own behalf. 6. Skin/Wound Care: Routine pressure relief measures 7. Fluids/Electrolytes/Nutrition: Monitor I/O.  BUN/Cr nl 8.HTN: monitor BP tid. Continue Norvasc, catapress, lasix, bystolic and losartan. Added prn clonidine for sys BP>160, Cardiology to make further adjustments as necessary  Fairly controlled at presents, slightly labile. Vitals:   03/28/16 1311 03/29/16 0651  BP: (!) 128/56 (!) 152/77  Pulse: 61 62  Resp: 18 18  Temp: 98 F (36.7 C) 98.8 F (37.1 C)   9.T2DM: Monitor BS ac/hs. Appetite has been good. Continue metformin and glipizide.  CBG (last 3)   Recent Labs  03/28/16 1630 03/28/16 2037 03/29/16 0650  GLUCAP 101* 104* 71   10 Dyslipidemia: Resume at discharge.   LOS (Days) 5 A FACE TO FACE EVALUATION WAS PERFORMED  Richard Alvarado [name] 03/29/2016, 9:00 AM

## 2016-03-30 ENCOUNTER — Inpatient Hospital Stay (HOSPITAL_COMMUNITY): Payer: Medicare Other

## 2016-03-30 ENCOUNTER — Inpatient Hospital Stay (HOSPITAL_COMMUNITY): Payer: Medicare Other | Admitting: Physical Therapy

## 2016-03-30 LAB — GLUCOSE, CAPILLARY
Glucose-Capillary: 84 mg/dL (ref 65–99)
Glucose-Capillary: 174 mg/dL — ABNORMAL HIGH (ref 65–99)
Glucose-Capillary: 158 mg/dL — ABNORMAL HIGH (ref 65–99)
Glucose-Capillary: 101 mg/dL — ABNORMAL HIGH (ref 65–99)

## 2016-03-30 MED ORDER — METFORMIN HCL 500 MG PO TABS
1000.0000 mg | ORAL_TABLET | Freq: Two times a day (BID) | ORAL | Status: DC
  Administered 2016-03-30 – 2016-04-03 (×8): 1000 mg via ORAL
  Filled 2016-03-30 (×8): qty 2

## 2016-03-30 MED ORDER — GLIPIZIDE 10 MG PO TABS
5.0000 mg | ORAL_TABLET | Freq: Two times a day (BID) | ORAL | Status: DC
  Administered 2016-03-30 – 2016-04-03 (×8): 5 mg via ORAL
  Filled 2016-03-30 (×8): qty 1

## 2016-03-30 NOTE — Progress Notes (Signed)
Physical Therapy Session Note  Patient Details  Name: Richard Alvarado MRN: [id number] Date of Birth: 1940-07-24  Today's Date: 03/30/2016 PT Individual Time: 1000-1055 PT Individual Time Calculation (min): 55 min    Short Term Goals: Week 1:  PT Short Term Goal 1 (Week 1): =LTGs due to ELOS  Skilled Therapeutic Interventions/Progress Updates:    Patient in recliner upon arrival with L aircast donned, reporting nausea all morning, RN notified and patient provided with ginger ale. Gait training from room to gym using RW with supervision. Performed NuStep using BUE/BLE at level 5 x 10 min for L neuro re-ed, reciprocal pattern training, and endurance. Stair training up/down 12 (6") stairs using L rail ascending to simulate home environment with supervision and max verbal cues for safe step-to pattern. Remainder of session focused on standing activities in parallel bars to increase L knee control and LLE NMR including side stepping to R and L while pushing bolster with outside of foot, forward/backward ambulation, and squats. Patient ambulated back to room without AD with improved L step length and fluidity of gait with supervision. Patient left sitting in recliner with all needs within reach.   Therapy Documentation Precautions:  Precautions Precautions: Fall Required Braces or Orthoses: Other Brace/Splint Other Brace/Splint: L aircast for ankle instability Restrictions Weight Bearing Restrictions: No Pain: Pain Assessment Pain Assessment: No/denies pain  See Function Navigator for Current Functional Status.   Therapy/Group: Individual Therapy  [name] 03/30/2016, 11:00 AM

## 2016-03-30 NOTE — Progress Notes (Signed)
Occupational Therapy Session Note  Patient Details  Name: Richard Alvarado MRN: [id number] Date of Birth: 21-Apr-1941  Today's Date: 03/30/2016 OT Individual Time: 0800-0900 OT Individual Time Calculation (min): 60 min   Short Term Goals:Week 1:  OT Short Term Goal 1 (Week 1): STGs=LTGs secondary to estimated LOS  Skilled Therapeutic Interventions/Progress Updates: ADL-retraining at shower level with focus on improved safety awareness, functional mobility using RW, transfers, and LUE coordination.  Pt received supine in bed, room darkened and meal tray untouched.   Pt awakened with min environmental stimulation and he immediately requested assist to toilet.   With setup to provide RW and steadying assist, pt completed bed mobility using bed rail and ambulated to bathroom briskly with contact guard for safety and vc to slow down due to incoordination with use of left hand during mobility and transfer to [facility] over toilet.   Pt voided watery BM and accepted cue to proceed to shower after min assist to problem-solve access to toilet paper dispenser.   Pt required intermittent assist to problem-solve use of hand shower to prevent excessive water spray to bathroom, 2 times.   Pt bathed unassisted and recovered to recliner to dress with setup to provide clothing and to assist with donning air cast d/t decreased [place] at left hand.   Pt then ambulated to sink with contact guard and groomed (brushing his teeth) but deferred shaving.   Pt returned to and remained seated in recliner at end of session with all needs placed within reach.   Overall pt required vc to slow down pace of movements and to problem-solve, with contact guard during mobility d/t scissor gait pattern with intermittent need to self-correct instability while ambulating.     Therapy Documentation Precautions:  Precautions Precautions: Fall Required Braces or Orthoses: Other Brace/Splint Other Brace/Splint: L aircast for ankle  instability Restrictions Weight Bearing Restrictions: No  Vital Signs: Therapy Vitals Temp: 98.5 F (36.9 C) Temp Source: Oral Pulse Rate: 71 Resp: 18 BP: 138/60 Patient Position (if appropriate): Lying Oxygen Therapy SpO2: 97 % O2 Device: Not Delivered   Pain:No/denies pain   See Function Navigator for Current Functional Status.   Therapy/Group: Individual Therapy   Second session: Time: 1345-1500 Time Calculation (min): 75 min  Pain Assessment: No/denies pain  Skilled Therapeutic Interventions: Therapeutic activity with focus on improved coordination of LUE, dynamic standing balance, endurance, left attention, memory, and safety awareness.   Pt received seated in recliner and escorted to [place] for training on Nintendo Wii to include balance activities and coordination with LUE.   Pt instructed on frisbee dog game using left hand with Wii controller.   Repeated trials with use right hand were unsuccessful d/t pt unable to process instructions and attend to visual feedback from screen.    Pt was then educated on balance assessments using Wii Balance board and advanced to balance games (table tilt) successful to level 5 of game, 3 times.    Pt then ambulated to [facility] clinic and was educated on use of [employer] to enhance attention to left and coordination with left UE.    Pt initially demonstrated a 50/100 second lag in reaction time at left lower quadrant using left hand compared to right but his performance improved with each trial (4 trials completed: 2 with right hand and 2 with left, using program A).   Pt increased reaction time to average 1.10 seconds by his 4th trial during 60 second sessions.   Pt was instructed to return  to his room and required 4 cues for direction while ambulating back from [place] wing using his RW with supervision.   See FIM for current functional status  Therapy/Group: Individual Therapy  Richard Alvarado 03/30/2016, 9:17 AM

## 2016-03-30 NOTE — Significant Event (Signed)
Hypoglycemic Event  CBG: 68  Treatment: 15 GM carbohydrate snack  Symptoms: None  Follow-up CBG: Time:2100 CBG Result:66  Possible Reasons for Event: Inadequate meal intake  Comments/MD notified: Patient given another snack.  Remains asymptomatic.     Fredna Dow M

## 2016-03-30 NOTE — Progress Notes (Signed)
Subjective/Complaints: Pt resting comfortably in bed.  He slept well overnight.  He is looking forward to rest and football.   ROS:  Denies CP, SOB, N/V/D  Objective: Vital Signs: Blood pressure 138/60, pulse 71, temperature 98.5 F (36.9 C), temperature source Oral, resp. rate 18, height 5\' 10"  (1.778 m), weight 93.7 kg (206 lb 9.6 oz), SpO2 97 %. No results found. Results for orders placed or performed during the hospital encounter of 03/24/16 (from the past 72 hour(s))  Glucose, capillary     Status: None   Collection Time: 03/27/16 12:03 PM  Result Value Ref Range   Glucose-Capillary 97 65 - 99 mg/dL  Glucose, capillary     Status: Abnormal   Collection Time: 03/27/16  4:33 PM  Result Value Ref Range   Glucose-Capillary 135 (H) 65 - 99 mg/dL  Glucose, capillary     Status: None   Collection Time: 03/27/16  8:30 PM  Result Value Ref Range   Glucose-Capillary 74 65 - 99 mg/dL   Comment 1 Notify RN   Glucose, capillary     Status: None   Collection Time: 03/28/16  6:31 AM  Result Value Ref Range   Glucose-Capillary 73 65 - 99 mg/dL   Comment 1 Notify RN   Glucose, capillary     Status: None   Collection Time: 03/28/16 12:00 PM  Result Value Ref Range   Glucose-Capillary 75 65 - 99 mg/dL  Glucose, capillary     Status: Abnormal   Collection Time: 03/28/16  4:30 PM  Result Value Ref Range   Glucose-Capillary 101 (H) 65 - 99 mg/dL  Glucose, capillary     Status: Abnormal   Collection Time: 03/28/16  8:37 PM  Result Value Ref Range   Glucose-Capillary 104 (H) 65 - 99 mg/dL  Glucose, capillary     Status: None   Collection Time: 03/29/16  6:50 AM  Result Value Ref Range   Glucose-Capillary 71 65 - 99 mg/dL  Glucose, capillary     Status: None   Collection Time: 03/29/16 11:36 AM  Result Value Ref Range   Glucose-Capillary 80 65 - 99 mg/dL  Glucose, capillary     Status: None   Collection Time: 03/29/16  4:29 PM  Result Value Ref Range   Glucose-Capillary 86 65 - 99  mg/dL  Glucose, capillary     Status: None   Collection Time: 03/29/16  8:32 PM  Result Value Ref Range   Glucose-Capillary 68 65 - 99 mg/dL  Glucose, capillary     Status: None   Collection Time: 03/29/16  9:00 PM  Result Value Ref Range   Glucose-Capillary 66 65 - 99 mg/dL  Glucose, capillary     Status: None   Collection Time: 03/29/16  9:40 PM  Result Value Ref Range   Glucose-Capillary 71 65 - 99 mg/dL  Glucose, capillary     Status: None   Collection Time: 03/29/16  9:43 PM  Result Value Ref Range   Glucose-Capillary 72 65 - 99 mg/dL  Glucose, capillary     Status: None   Collection Time: 03/30/16  6:48 AM  Result Value Ref Range   Glucose-Capillary 84 65 - 99 mg/dL     HEENT: normocephalic, atraumatic.  Cardio: RRR and no murmur Resp: B/l CTA GI: BS positive and NT, ND Skin:   Intact. Warm and dry. Neuro: Alert/Oriented,  Motor: 4-4+/5 Left Delt bi, tri, grip, HF, KE, ADF  Musc/Skel:  No edema. No tenderness. Gen NAD.  Vital signs reviewed.    Assessment/Plan: 1. Functional deficits secondary to Right basal ganglia ICH which require 3+ hours per day of interdisciplinary therapy in a comprehensive inpatient rehab setting. Physiatrist is providing close team supervision and 24 hour management of active medical problems listed below. Physiatrist and rehab team continue to assess barriers to discharge/monitor patient progress toward functional and medical goals. FIM: Function - Bathing Position: Shower Body parts bathed by patient: Right arm, Left arm, Chest, Abdomen, Front perineal area, Buttocks, Right upper leg, Left upper leg, Right lower leg, Left lower leg Body parts bathed by helper: Back Assist Level: Touching or steadying assistance(Pt > 75%)  Function- Upper Body Dressing/Undressing What is the patient wearing?: Pull over shirt/dress Pull over shirt/dress - Perfomed by patient: Thread/unthread right sleeve, Thread/unthread left sleeve, Put head through  opening, Pull shirt over trunk Button up shirt - Perfomed by patient: Thread/unthread right sleeve, Thread/unthread left sleeve, Pull shirt around back, Button/unbutton shirt Assist Level: Supervision or verbal cues Set up : To obtain clothing/put away Function - Lower Body Dressing/Undressing What is the patient wearing?: Underwear, Pants, Socks, Shoes Position: Sitting EOB Underwear - Performed by patient: Thread/unthread right underwear leg, Thread/unthread left underwear leg, Pull underwear up/down Underwear - Performed by helper: Thread/unthread left underwear leg Pants- Performed by patient: Thread/unthread right pants leg, Thread/unthread left pants leg, Pull pants up/down Pants- Performed by helper: Thread/unthread left pants leg Socks - Performed by patient: Don/doff right sock, Don/doff left sock Shoes - Performed by patient: Don/doff right shoe, Fasten right, Fasten left Shoes - Performed by helper: Don/doff left shoe Assist for footwear: Supervision/touching assist, Setup Assist for lower body dressing: Supervision or verbal cues, Set up, Touching or steadying assistance (Pt > 75%) Set up : To obtain clothing/put away (don ankle support)  Function - Toileting Toileting steps completed by patient: Adjust clothing prior to toileting, Performs perineal hygiene, Adjust clothing after toileting Toileting steps completed by helper: Performs perineal hygiene, Adjust clothing after toileting (per [employer], NT assist past bm) Toileting Assistive Devices: Grab bar or rail Assist level: Supervision or verbal cues  Function - Air cabin crew transfer assistive device: Grab bar Assist level to toilet: Touching or steadying assistance (Pt > 75%) Assist level from toilet: Touching or steadying assistance (Pt > 75%) Assist level to bedside commode (at bedside): Maximal assist (Pt 25 - 49%/lift and lower) (per [employer], NT) Assist level from bedside commode (at bedside):  Maximal assist (Pt 25 - 49%/lift and lower) (per [employer], NT)  Function - Chair/bed transfer Chair/bed transfer method: Ambulatory Chair/bed transfer assist level: Touching or steadying assistance (Pt > 75%) Chair/bed transfer assistive device: Environmental consultant, Armrests  Function - Locomotion: Wheelchair Will patient use wheelchair at discharge?: No Type: Manual Max wheelchair distance: 160 Assist Level: Supervision or verbal cues Assist Level: Supervision or verbal cues Assist Level: Supervision or verbal cues Turns around,maneuvers to table,bed, and toilet,negotiates 3% grade,maneuvers on rugs and over doorsills: No Function - Locomotion: Ambulation Assistive device: No device Max distance: 160' Assist level: Touching or steadying assistance (Pt > 75%) Assist level: Touching or steadying assistance (Pt > 75%) Assist level: Touching or steadying assistance (Pt > 75%) Walk 150 feet activity did not occur: Safety/medical concerns (endurance') Assist level: Touching or steadying assistance (Pt > 75%) Assist level: Supervision or verbal cues  Function - Comprehension Comprehension: Auditory Comprehension assist level: Follows complex conversation/direction with no assist  Function - Expression Expression: Verbal Expression assist level: Expresses complex ideas: With  no assist  Function - Social Interaction Social Interaction assist level: Interacts appropriately with others - No medications needed.  Function - Problem Solving Problem solving assist level: Solves complex problems: Recognizes & self-corrects  Function - Memory Memory assist level: Complete Independence: No helper Patient normally able to recall (first 3 days only): Current season, Location of own room, Staff names and faces, That he or she is in a hospital Medical Problem List and Plan: 1.  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  DVT  Prophylaxis/Anticoagulation: Mechanical: Sequential compression devices, below knee Bilateral lower extremities-started lovenox  3. Pain Management:  N/A 4. Mood: LCSW to follow for evaluation and support.  5. Neuropsych: This patient is capable of making decisions on his own behalf. 6. Skin/Wound Care: Routine pressure relief measures 7. Fluids/Electrolytes/Nutrition: Monitor I/O. BUN/Cr nl 8.HTN: monitor BP tid. Continue Norvasc, catapress, lasix, bystolic and losartan. Added prn clonidine for sys BP>160, Cardiology to make further adjustments as necessary  Fairly controlled at present. Vitals:   03/29/16 2144 03/30/16 0606  BP: (!) 158/67 138/60  Pulse:  71  Resp:  18  Temp:  98.5 F (36.9 C)   9.T2DM: Monitor BS ac/hs. Appetite has been good.   Continue metformin   Glipizide decreased to 5mg   BID on 10/22 CBG (last 3)   Recent Labs  03/29/16 2140 03/29/16 2143 03/30/16 0648  GLUCAP 71 72 84   10 Dyslipidemia: Resume at discharge.   LOS (Days) 6 A FACE TO FACE EVALUATION WAS PERFORMED  Eldon Zietlow [name] 03/30/2016, 8:38 AM

## 2016-03-30 NOTE — Significant Event (Signed)
Hypoglycemic Event  CBG: 66  Treatment: 15 GM carbohydrate snack  Symptoms: None  Follow-up CBG: Time:2140 CBG Result: 71  Possible Reasons for Event: Inadequate meal intake  Comments/MD notified: Patient responded to snack given without incident.  Remained asymptomatic. Will notify Dr. Posey Pronto of event.     Fredna Dow M

## 2016-03-31 ENCOUNTER — Inpatient Hospital Stay (HOSPITAL_COMMUNITY): Payer: Medicare Other | Admitting: Occupational Therapy

## 2016-03-31 ENCOUNTER — Inpatient Hospital Stay (HOSPITAL_COMMUNITY): Payer: Medicare Other | Admitting: Physical Therapy

## 2016-03-31 LAB — GLUCOSE, CAPILLARY
Glucose-Capillary: 95 mg/dL (ref 65–99)
Glucose-Capillary: 129 mg/dL — ABNORMAL HIGH (ref 65–99)
Glucose-Capillary: 99 mg/dL (ref 65–99)
Glucose-Capillary: 106 mg/dL — ABNORMAL HIGH (ref 65–99)

## 2016-03-31 NOTE — Progress Notes (Addendum)
Occupational Therapy Session Note  Patient Details  Name: Richard Alvarado MRN: [id number] Date of Birth: 11-Dec-1940  Today's Date: 03/31/2016 OT Individual Time: [id number] OT Individual Time Calculation (min): 41 min     Short Term Goals: Week 1:  OT Short Term Goal 1 (Week 1): STGs=LTGs secondary to estimated LOS    Skilled Therapeutic Interventions/Progress Updates:   Pt was supine in bed at time of arrival and agreeable to tx in gym focusing on NMR of L UE. Pt ambulated to gym with supervision and RW. Fine motor activities involving geometric shapes and playing cards completed with emphasis on LUE in-hand manipulation and crossing midline. Pt exhibited difficultly with hand to finger translation but can complete with extra time. Pt then ambulated back to room and toileted with supervision. Pt transferred to recliner and was left with all needs within reach at end of session.   Therapy Documentation Precautions:  Precautions Precautions: Fall Required Braces or Orthoses: Other Brace/Splint Other Brace/Splint: L aircast for ankle instability Restrictions Weight Bearing Restrictions: No General:   Vital Signs: Therapy Vitals BP: (!) 166/74 Pain: No c/o pain during session       See Function Navigator for Current Functional Status.   Therapy/Group: Individual Therapy  [place] Free [place] 03/31/2016, 12:56 PM

## 2016-03-31 NOTE — Progress Notes (Signed)
Occupational Therapy Session Note  Patient Details  Name: Richard Alvarado MRN: [id number] Date of Birth: 1941-03-24  Today's Date: 03/31/2016 OT Individual Time: 1135-1202 and 1400-1500 OT Individual Time Calculation (min): 27 min and 60 min    Short Term Goals: Week 1:  OT Short Term Goal 1 (Week 1): STGs=LTGs secondary to estimated LOS  Skilled Therapeutic Interventions/Progress Updates:    1) Treatment session with focus on dynamic standing balance, balance reactions, and LUE FMC and sensation.  Pt received upright in recliner with no c/o pain.  Ambulated to therapy gym with RW without assist and improved gait speed and awareness of LLE.  Engaged in balance activity on Biodex with focus on weight shifting in various directions to increase motor quality and awareness of LLE during weight shifting.  Discussed functional carryover of activity with pt reporting understanding.  Engaged in [facility] activity with focus on sensation with pt able to retrieve small beads out of container with various textures without use of vision and then thread beads onto string.  Pt reporting improved sensation and awareness of LUE during fine motor tasks.  Returned to room and left seated in recliner with all needs in reach.  2) Treatment session with focus on fine and gross motor control and dynamic standing balance.  Engaged in [place] in standing with focus on reaching with LUE as well as increased cognitive challenge and divided attention.  Pt with improved reaction time this session compared to previous session with average reaction 1.41 with use of LUE only.  Utilized red/green setting with pt instructed to press red lights with Rt and green lights with Lt with pt completing with only 1 error but with slower reaction time of 1.51 on Rt and 1.66 on Lt.  Discussed implications with slower reaction time with increased concentration and decreased familiarity and carryover to functional tasks.  Further challenged  activity to incorporating grocery list to challenge divided attention with pt correctly naming 6/10 words and selecting 25/38 lights when given a 2 second time limit for reaction time with lights.  Engaged in dynamic standing with reaching to replicate picture with pegs using LUE.  Noted pt to have decreased motor control with more gross motor tasks than fine motor tasks, therefore increased focus on reaching this session with pegs and cups.  Returned to room and left in recliner with all needs in reach.  Therapy Documentation Precautions:  Precautions Precautions: Fall Required Braces or Orthoses: Other Brace/Splint Other Brace/Splint: L aircast for ankle instability Restrictions Weight Bearing Restrictions: No General:   Vital Signs: Therapy Vitals BP: (!) 166/74 Pain:  Pt with no c/o pain  See Function Navigator for Current Functional Status.   Therapy/Group: Individual Therapy  Simonne Come 03/31/2016, 12:32 PM

## 2016-03-31 NOTE — Progress Notes (Signed)
Subjective/Complaints: No issues overnite Discussed improvements with BP  ROS:  Denies CP, SOB, N/V/D  Objective: Vital Signs: Blood pressure (!) 152/70, pulse 60, temperature 98.6 F (37 C), temperature source Oral, resp. rate 16, height 5\' 10"  (1.778 m), weight 93.7 kg (206 lb 9.6 oz), SpO2 96 %. No results found. Results for orders placed or performed during the hospital encounter of 03/24/16 (from the past 72 hour(s))  Glucose, capillary     Status: None   Collection Time: 03/28/16 12:00 PM  Result Value Ref Range   Glucose-Capillary 75 65 - 99 mg/dL  Glucose, capillary     Status: Abnormal   Collection Time: 03/28/16  4:30 PM  Result Value Ref Range   Glucose-Capillary 101 (H) 65 - 99 mg/dL  Glucose, capillary     Status: Abnormal   Collection Time: 03/28/16  8:37 PM  Result Value Ref Range   Glucose-Capillary 104 (H) 65 - 99 mg/dL  Glucose, capillary     Status: None   Collection Time: 03/29/16  6:50 AM  Result Value Ref Range   Glucose-Capillary 71 65 - 99 mg/dL  Glucose, capillary     Status: None   Collection Time: 03/29/16 11:36 AM  Result Value Ref Range   Glucose-Capillary 80 65 - 99 mg/dL  Glucose, capillary     Status: None   Collection Time: 03/29/16  4:29 PM  Result Value Ref Range   Glucose-Capillary 86 65 - 99 mg/dL  Glucose, capillary     Status: None   Collection Time: 03/29/16  8:32 PM  Result Value Ref Range   Glucose-Capillary 68 65 - 99 mg/dL  Glucose, capillary     Status: None   Collection Time: 03/29/16  9:00 PM  Result Value Ref Range   Glucose-Capillary 66 65 - 99 mg/dL  Glucose, capillary     Status: None   Collection Time: 03/29/16  9:40 PM  Result Value Ref Range   Glucose-Capillary 71 65 - 99 mg/dL  Glucose, capillary     Status: None   Collection Time: 03/29/16  9:43 PM  Result Value Ref Range   Glucose-Capillary 72 65 - 99 mg/dL  Glucose, capillary     Status: None   Collection Time: 03/30/16  6:48 AM  Result Value Ref Range    Glucose-Capillary 84 65 - 99 mg/dL  Glucose, capillary     Status: Abnormal   Collection Time: 03/30/16 11:35 AM  Result Value Ref Range   Glucose-Capillary 174 (H) 65 - 99 mg/dL  Glucose, capillary     Status: Abnormal   Collection Time: 03/30/16  4:19 PM  Result Value Ref Range   Glucose-Capillary 158 (H) 65 - 99 mg/dL  Glucose, capillary     Status: Abnormal   Collection Time: 03/30/16  8:34 PM  Result Value Ref Range   Glucose-Capillary 101 (H) 65 - 99 mg/dL  Glucose, capillary     Status: None   Collection Time: 03/31/16  6:38 AM  Result Value Ref Range   Glucose-Capillary 95 65 - 99 mg/dL     HEENT: normocephalic, atraumatic.  Cardio: RRR and no murmur Resp: B/l CTA GI: BS positive and NT, ND Skin:   Intact. Warm and dry. Neuro: Alert/Oriented,  Motor: 4-4+/5 Left Delt bi, tri, grip, HF, KE, ADF  Musc/Skel:  No edema. No tenderness. Gen NAD. Vital signs reviewed.    Assessment/Plan: 1. Functional deficits secondary to Right basal ganglia ICH which require 3+ hours per day of interdisciplinary  therapy in a comprehensive inpatient rehab setting. Physiatrist is providing close team supervision and 24 hour management of active medical problems listed below. Physiatrist and rehab team continue to assess barriers to discharge/monitor patient progress toward functional and medical goals. FIM: Function - Bathing Position: Shower Body parts bathed by patient: Right arm, Left arm, Chest, Abdomen, Front perineal area, Buttocks, Right upper leg, Left upper leg, Right lower leg, Left lower leg Body parts bathed by helper: Back Assist Level: Touching or steadying assistance(Pt > 75%)  Function- Upper Body Dressing/Undressing What is the patient wearing?: Pull over shirt/dress Pull over shirt/dress - Perfomed by patient: Thread/unthread right sleeve, Thread/unthread left sleeve, Put head through opening, Pull shirt over trunk Button up shirt - Perfomed by patient:  Thread/unthread right sleeve, Thread/unthread left sleeve, Pull shirt around back, Button/unbutton shirt Assist Level: Supervision or verbal cues Set up : To obtain clothing/put away Function - Lower Body Dressing/Undressing What is the patient wearing?: Underwear, Pants, Socks, Shoes Position: Sitting EOB Underwear - Performed by patient: Thread/unthread right underwear leg, Thread/unthread left underwear leg, Pull underwear up/down Underwear - Performed by helper: Thread/unthread left underwear leg Pants- Performed by patient: Thread/unthread right pants leg, Thread/unthread left pants leg, Pull pants up/down Pants- Performed by helper: Thread/unthread left pants leg Socks - Performed by patient: Don/doff right sock, Don/doff left sock Shoes - Performed by patient: Don/doff right shoe, Fasten right, Fasten left Shoes - Performed by helper: Don/doff left shoe Assist for footwear: Supervision/touching assist, Setup Assist for lower body dressing: Supervision or verbal cues, Set up, Touching or steadying assistance (Pt > 75%) Set up : To obtain clothing/put away (don ankle support)  Function - Toileting Toileting steps completed by patient: Adjust clothing prior to toileting, Performs perineal hygiene, Adjust clothing after toileting Toileting steps completed by helper: Performs perineal hygiene, Adjust clothing after toileting (per [employer], NT assist past bm) Toileting Assistive Devices: Grab bar or rail Assist level: Supervision or verbal cues  Function - Air cabin crew transfer assistive device: Grab bar Assist level to toilet: Touching or steadying assistance (Pt > 75%) Assist level from toilet: Touching or steadying assistance (Pt > 75%) Assist level to bedside commode (at bedside): Maximal assist (Pt 25 - 49%/lift and lower) (per [employer], NT) Assist level from bedside commode (at bedside): Maximal assist (Pt 25 - 49%/lift and lower) (per [employer],  NT)  Function - Chair/bed transfer Chair/bed transfer method: Ambulatory Chair/bed transfer assist level: Supervision or verbal cues Chair/bed transfer assistive device: Armrests, Walker  Function - Locomotion: Wheelchair Will patient use wheelchair at discharge?: No Type: Manual Max wheelchair distance: 160 Assist Level: Supervision or verbal cues Assist Level: Supervision or verbal cues Assist Level: Supervision or verbal cues Turns around,maneuvers to table,bed, and toilet,negotiates 3% grade,maneuvers on rugs and over doorsills: No Function - Locomotion: Ambulation Assistive device: Walker-rolling Max distance: 160' Assist level: Supervision or verbal cues Assist level: Supervision or verbal cues Assist level: Supervision or verbal cues Walk 150 feet activity did not occur: Safety/medical concerns (endurance') Assist level: Supervision or verbal cues Assist level: Supervision or verbal cues  Function - Comprehension Comprehension: Auditory Comprehension assist level: Follows complex conversation/direction with no assist  Function - Expression Expression: Verbal Expression assist level: Expresses complex ideas: With no assist  Function - Social Interaction Social Interaction assist level: Interacts appropriately with others - No medications needed.  Function - Problem Solving Problem solving assist level: Solves complex problems: Recognizes & self-corrects  Function - Memory Memory assist  level: Complete Independence: No helper Patient normally able to recall (first 3 days only): Current season, Location of own room, Staff names and faces, That he or she is in a hospital Medical Problem List and Plan: 1.  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  DVT Prophylaxis/Anticoagulation: Mechanical: Sequential compression devices, below knee Bilateral lower extremities-started lovenox  3. Pain Management:  N/A 4.  Mood: LCSW to follow for evaluation and support.  5. Neuropsych: This patient is capable of making decisions on his own behalf. 6. Skin/Wound Care: Routine pressure relief measures 7. Fluids/Electrolytes/Nutrition: Monitor I/O. BUN/Cr nl 8.HTN: monitor BP tid. Continue Norvasc 10mg , catapress .2mg  TID, lasix, bystolic 10mg  and losartan 100mg  BID. Added prn clonidine for sys BP>160, Cardiology available for further adjustments as needed  Fairly controlled at present. Vitals:   03/30/16 1508 03/31/16 0641  BP: 110/72 (!) 152/70  Pulse: 82 60  Resp: 14 16  Temp: 98.1 F (36.7 C) 98.6 F (37 C)   9.T2DM: Monitor BS ac/hs. Appetite has been good.   Continue metformin   Glipizide decreased to 5mg   BID on 10/22, no hypoglycemia CBG (last 3)   Recent Labs  03/30/16 1619 03/30/16 2034 03/31/16 0638  GLUCAP 158* 101* 95   10 Dyslipidemia: Resume at discharge.   LOS (Days) 7 A FACE TO FACE EVALUATION WAS PERFORMED  KIRSTEINS,ANDREW E 03/31/2016, 8:11 AM

## 2016-03-31 NOTE — Progress Notes (Signed)
Physical Therapy Session Note  Patient Details  Name: Richard Alvarado MRN: [id number] Date of Birth: 01/03/1941  Today's Date: 03/31/2016 PT Individual Time: 1300-1357 PT Individual Time Calculation (min): 57 min    Short Term Goals: Week 1:  PT Short Term Goal 1 (Week 1): =LTGs due to ELOS  Skilled Therapeutic Interventions/Progress Updates:  Pt resting in recliner on arrival, no c/o pain, and agreeable to therapy session.  Session focus on NMR and activity tolerance via gait training, dynamic balance, and therex.  Gait training to/from therapy gym with no AD and supervision with min verbal cues to decrease knee hyperextension during swing phase of gait.  Dynamic standing balance with toes on 3" wedge reaching across midline to R and L for horseshoes.  PT readministered BERG and patient demonstrates significant fall risk as noted by score of 43/56 on Berg Balance Scale.  Discussed areas to improve on and recommendations for improved safety at home.  NMR in // bars with minisquats x15 and split stance minisquats 2x10 which each foot leading.  Pt returned to room at end of session and left upright in recliner with call bell in reach and needs met.       Therapy Documentation Precautions:  Precautions Precautions: Fall Required Braces or Orthoses: Other Brace/Splint Other Brace/Splint: L aircast for ankle instability Restrictions Weight Bearing Restrictions: No  Balance: Standardized Balance Assessment Standardized Balance Assessment: Berg Balance Test Berg Balance Test Sit to Stand: Able to stand without using hands and stabilize independently Standing Unsupported: Able to stand safely 2 minutes Sitting with Back Unsupported but Feet Supported on Floor or Stool: Able to sit safely and securely 2 minutes Stand to Sit: Controls descent by using hands Transfers: Able to transfer safely, minor use of hands Standing Unsupported with Eyes Closed: Able to stand 10 seconds  safely Standing Ubsupported with Feet Together: Able to place feet together independently and stand for 1 minute with supervision From Standing, Reach Forward with Outstretched Arm: Can reach confidently >25 cm (10") From Standing Position, Pick up Object from Floor: Able to pick up shoe, needs supervision From Standing Position, Turn to Look Behind Over each Shoulder: Turn sideways only but maintains balance Turn 360 Degrees: Able to turn 360 degrees safely but slowly Standing Unsupported, Alternately Place Feet on Step/Stool: Able to complete 4 steps without aid or supervision Standing Unsupported, One Foot in Front: Able to plae foot ahead of the other independently and hold 30 seconds Standing on One Leg: Tries to lift leg/unable to hold 3 seconds but remains standing independently Total Score: 43   See Function Navigator for Current Functional Status.   Therapy/Group: Individual Therapy  Caitlin E Penven-Crew 03/31/2016, 1:38 PM  

## 2016-03-31 NOTE — Progress Notes (Signed)
BP fairly well controlled - please call with questions. Cardiology will sign-off.  Pixie Casino, MD, [facility] Attending Cardiologist [place]

## 2016-04-01 ENCOUNTER — Inpatient Hospital Stay (HOSPITAL_COMMUNITY): Payer: Medicare Other | Admitting: Physical Therapy

## 2016-04-01 ENCOUNTER — Inpatient Hospital Stay (HOSPITAL_COMMUNITY): Payer: Medicare Other | Admitting: Occupational Therapy

## 2016-04-01 ENCOUNTER — Inpatient Hospital Stay (HOSPITAL_COMMUNITY): Payer: Medicare Other

## 2016-04-01 LAB — GLUCOSE, CAPILLARY
Glucose-Capillary: 107 mg/dL — ABNORMAL HIGH (ref 65–99)
Glucose-Capillary: 98 mg/dL (ref 65–99)
Glucose-Capillary: 102 mg/dL — ABNORMAL HIGH (ref 65–99)
Glucose-Capillary: 92 mg/dL (ref 65–99)

## 2016-04-01 NOTE — Progress Notes (Signed)
Occupational Therapy Session Note  Patient Details  Name: Richard Alvarado MRN: [id number] Date of Birth: 1940-06-29  Today's Date: 04/01/2016 OT Individual Time: 0730-0830 OT Individual Time Calculation (min): 60 min     Short Term Goals: Week 1:  OT Short Term Goal 1 (Week 1): STGs=LTGs secondary to estimated LOS  Skilled Therapeutic Interventions/Progress Updates:    Treatment session with focus on functional mobility and self-care retraining.  Pt received in bed with no c/o pain.  Ambulated to room shower with RW and distant supervision.  Completed bathing at mod I level from sit > stand in shower and dressing with supervision/min cues for attention to LLE with sit> stand and when donning air cast.  Educated on tub/shower transfers with use of shower chair and grab bar.  Pt reports preference for shower chair over tub bench and able to complete with supervision with use of one grab bar.  Discussed purchasing grab bar to increase safety with transfers as well as having wife present during showering and shower transfers to which pt agreed.  Completed 9 hole peg test with Rt: 26 seconds and Lt: 28 seconds, with improved coordination and reports of improved sensation.  Pt noticeably concerned about something, upon questioning, pt reports negative interaction with staff this AM when bringing in breakfast tray.  Pt not wanting to complain or further discuss, but noticeably upset.  RN aware.  Therapy Documentation Precautions:  Precautions Precautions: Fall Required Braces or Orthoses: Other Brace/Splint Other Brace/Splint: L aircast for ankle instability Restrictions Weight Bearing Restrictions: No Pain:  Pt with no c/o pain  See Function Navigator for Current Functional Status.   Therapy/Group: Individual Therapy  Simonne Come 04/01/2016, 9:29 AM

## 2016-04-01 NOTE — Progress Notes (Signed)
Subjective/Complaints: Watched [employer] game last night otherwise rested well.  Practicing LUE functional use in PT/OT  ROS:  Denies CP, SOB, N/V/D  Objective: Vital Signs: Blood pressure (!) 163/72, pulse 65, temperature 98.7 F (37.1 C), temperature source Oral, resp. rate 17, height 5\' 10"  (1.778 m), weight 93.7 kg (206 lb 9.6 oz), SpO2 100 %. No results found. Results for orders placed or performed during the hospital encounter of 03/24/16 (from the past 72 hour(s))  Glucose, capillary     Status: None   Collection Time: 03/29/16 11:36 AM  Result Value Ref Range   Glucose-Capillary 80 65 - 99 mg/dL  Glucose, capillary     Status: None   Collection Time: 03/29/16  4:29 PM  Result Value Ref Range   Glucose-Capillary 86 65 - 99 mg/dL  Glucose, capillary     Status: None   Collection Time: 03/29/16  8:32 PM  Result Value Ref Range   Glucose-Capillary 68 65 - 99 mg/dL  Glucose, capillary     Status: None   Collection Time: 03/29/16  9:00 PM  Result Value Ref Range   Glucose-Capillary 66 65 - 99 mg/dL  Glucose, capillary     Status: None   Collection Time: 03/29/16  9:40 PM  Result Value Ref Range   Glucose-Capillary 71 65 - 99 mg/dL  Glucose, capillary     Status: None   Collection Time: 03/29/16  9:43 PM  Result Value Ref Range   Glucose-Capillary 72 65 - 99 mg/dL  Glucose, capillary     Status: None   Collection Time: 03/30/16  6:48 AM  Result Value Ref Range   Glucose-Capillary 84 65 - 99 mg/dL  Glucose, capillary     Status: Abnormal   Collection Time: 03/30/16 11:35 AM  Result Value Ref Range   Glucose-Capillary 174 (H) 65 - 99 mg/dL  Glucose, capillary     Status: Abnormal   Collection Time: 03/30/16  4:19 PM  Result Value Ref Range   Glucose-Capillary 158 (H) 65 - 99 mg/dL  Glucose, capillary     Status: Abnormal   Collection Time: 03/30/16  8:34 PM  Result Value Ref Range   Glucose-Capillary 101 (H) 65 - 99 mg/dL  Glucose, capillary     Status: None   Collection Time: 03/31/16  6:38 AM  Result Value Ref Range   Glucose-Capillary 95 65 - 99 mg/dL  Glucose, capillary     Status: Abnormal   Collection Time: 03/31/16 12:10 PM  Result Value Ref Range   Glucose-Capillary 129 (H) 65 - 99 mg/dL   Comment 1 Notify RN   Glucose, capillary     Status: None   Collection Time: 03/31/16  4:43 PM  Result Value Ref Range   Glucose-Capillary 99 65 - 99 mg/dL   Comment 1 Notify RN   Glucose, capillary     Status: Abnormal   Collection Time: 03/31/16  9:00 PM  Result Value Ref Range   Glucose-Capillary 106 (H) 65 - 99 mg/dL  Glucose, capillary     Status: Abnormal   Collection Time: 04/01/16  6:36 AM  Result Value Ref Range   Glucose-Capillary 107 (H) 65 - 99 mg/dL     HEENT: normocephalic, atraumatic.  Cardio: RRR and no murmur Resp: B/l CTA GI: BS positive and NT, ND Skin:   Intact. Warm and dry. Neuro: Alert/Oriented,  Motor: 4-4+/5 Left Delt bi, tri, grip, HF, KE, ADF  Musc/Skel:  No edema. No tenderness. Gen NAD. Vital signs  reviewed.    Assessment/Plan: 1. Functional deficits secondary to Right basal ganglia ICH which require 3+ hours per day of interdisciplinary therapy in a comprehensive inpatient rehab setting. Physiatrist is providing close team supervision and 24 hour management of active medical problems listed below. Physiatrist and rehab team continue to assess barriers to discharge/monitor patient progress toward functional and medical goals. FIM: Function - Bathing Position: Shower Body parts bathed by patient: Right arm, Left arm, Chest, Abdomen, Front perineal area, Buttocks, Right upper leg, Left upper leg, Right lower leg, Left lower leg Body parts bathed by helper: Back Assist Level: Touching or steadying assistance(Pt > 75%)  Function- Upper Body Dressing/Undressing What is the patient wearing?: Pull over shirt/dress Pull over shirt/dress - Perfomed by patient: Thread/unthread right sleeve, Thread/unthread left  sleeve, Put head through opening, Pull shirt over trunk Button up shirt - Perfomed by patient: Thread/unthread right sleeve, Thread/unthread left sleeve, Pull shirt around back, Button/unbutton shirt Assist Level: Supervision or verbal cues Set up : To obtain clothing/put away Function - Lower Body Dressing/Undressing What is the patient wearing?: Underwear, Pants Position: Sitting EOB Underwear - Performed by patient: Pull underwear up/down Underwear - Performed by helper: Thread/unthread left underwear leg Pants- Performed by patient: Pull pants up/down Pants- Performed by helper: Thread/unthread left pants leg Socks - Performed by patient: Don/doff right sock, Don/doff left sock Shoes - Performed by patient: Don/doff right shoe, Fasten right, Fasten left Shoes - Performed by helper: Don/doff left shoe Assist for footwear: Supervision/touching assist, Setup Assist for lower body dressing: Supervision or verbal cues, Set up, Touching or steadying assistance (Pt > 75%) Set up : To obtain clothing/put away (don ankle support)  Function - Toileting Toileting steps completed by patient: Adjust clothing prior to toileting, Performs perineal hygiene, Adjust clothing after toileting Toileting steps completed by helper: Performs perineal hygiene, Adjust clothing after toileting (per [employer], NT assist past bm) Toileting Assistive Devices: Grab bar or rail Assist level: Supervision or verbal cues  Function - Air cabin crew transfer assistive device: Grab bar Assist level to toilet: Supervision or verbal cues Assist level from toilet: Supervision or verbal cues Assist level to bedside commode (at bedside): Maximal assist (Pt 25 - 49%/lift and lower) (per [employer], NT) Assist level from bedside commode (at bedside): Maximal assist (Pt 25 - 49%/lift and lower) (per [employer], NT)  Function - Chair/bed transfer Chair/bed transfer method: Ambulatory Chair/bed transfer  assist level: Supervision or verbal cues Chair/bed transfer assistive device: Armrests  Function - Locomotion: Wheelchair Will patient use wheelchair at discharge?: No Type: Manual Max wheelchair distance: 160 Assist Level: Supervision or verbal cues Assist Level: Supervision or verbal cues Assist Level: Supervision or verbal cues Turns around,maneuvers to table,bed, and toilet,negotiates 3% grade,maneuvers on rugs and over doorsills: No Function - Locomotion: Ambulation Assistive device: No device Max distance: 160' Assist level: Supervision or verbal cues Assist level: Supervision or verbal cues Assist level: Supervision or verbal cues Walk 150 feet activity did not occur: Safety/medical concerns (endurance') Assist level: Supervision or verbal cues Assist level: Supervision or verbal cues  Function - Comprehension Comprehension: Auditory Comprehension assist level: Follows complex conversation/direction with no assist  Function - Expression Expression: Verbal Expression assist level: Expresses complex ideas: With extra time/assistive device  Function - Social Interaction Social Interaction assist level: Interacts appropriately with others - No medications needed.  Function - Problem Solving Problem solving assist level: Solves complex problems: Recognizes & self-corrects  Function - Memory Memory assist level:  Complete Independence: No helper Patient normally able to recall (first 3 days only): Current season, Location of own room, Staff names and faces, That he or she is in a hospital Medical Problem List and Plan: 1.  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  DVT Prophylaxis/Anticoagulation: Mechanical: Sequential compression devices, below knee Bilateral lower extremities-started lovenox  3. Pain Management:  N/A 4. Mood: LCSW to follow for evaluation and support.  5. Neuropsych: This patient  is capable of making decisions on his own behalf. 6. Skin/Wound Care: Routine pressure relief measures 7. Fluids/Electrolytes/Nutrition: Monitor I/O. BUN/Cr nl 8.HTN: monitor BP tid. Continue Norvasc 10mg , catapress .2mg  TID, lasix, bystolic 10mg  and losartan 100mg  BID. Added prn clonidine for sys BP>160, Cardiology now signed off  Vitals:   03/31/16 1306 04/01/16 0519  BP: 140/72 (!) 163/72  Pulse: 64 65  Resp: 17 17  Temp: 98.2 F (36.8 C) 98.7 F (37.1 C)   9.T2DM: Monitor BS ac/hs. Appetite has been good.   Continue metformin   Glipizide decreased to 5mg   BID on 10/22, well controlled at present CBG (last 3)   Recent Labs  03/31/16 1643 03/31/16 2100 04/01/16 0636  GLUCAP 99 106* 107*   10 Dyslipidemia: Resume at discharge.   LOS (Days) 8 A FACE TO FACE EVALUATION WAS PERFORMED  KIRSTEINS,ANDREW E 04/01/2016, 7:22 AM

## 2016-04-01 NOTE — Progress Notes (Signed)
Occupational Therapy Session Note  Patient Details  Name: Richard Alvarado MRN: [id number] Date of Birth: 12-05-1940  Today's Date: 04/01/2016 OT Individual Time: 1300-1330 OT Individual Time Calculation (min): 30 min     Short Term Goals: Week 1:  OT Short Term Goal 1 (Week 1): STGs=LTGs secondary to estimated LOS  Skilled Therapeutic Interventions/Progress Updates:   Therapeutic activity: focus on dynamic standing balance with side stepping with orange theraband for strengthening and conditioning LEs.  Also focused on dynamic standing balance on Kinetron with tactile cues for weight shifts and activation of core. Used mirror for visual feedback about posture.   Therapy Documentation Precautions:  Precautions Precautions: Fall Required Braces or Orthoses: Other Brace/Splint Other Brace/Splint: L aircast for ankle instability Restrictions Weight Bearing Restrictions: No General:   Vital Signs: Therapy Vitals Temp: 98.4 F (36.9 C) Temp Source: Oral Pulse Rate: 62 Resp: 18 BP: (!) 142/71 Patient Position (if appropriate): Sitting Oxygen Therapy SpO2: 100 % O2 Device: Not Delivered Pain:    See Function Navigator for Current Functional Status.   Therapy/Group: Individual Therapy  Willeen Cass [facility] 04/01/2016, 3:30 PM

## 2016-04-01 NOTE — Progress Notes (Signed)
Physical Therapy Session Note  Patient Details  Name: KYANDRE OKRAY MRN: [id number] Date of Birth: 1941/02/05  Today's Date: 04/01/2016 PT Individual Time: 1000-1100 PT Individual Time Calculation (min): 60 min    Short Term Goals: Week 1:  PT Short Term Goal 1 (Week 1): =LTGs due to ELOS  Skilled Therapeutic Interventions/Progress Updates:    Pt resting in recliner on arrival, no c/o pain, and agreeable to therapy session.  Session focus on gait training without AD for increase balance challenge x150' with close supervision, gait training with SPC >300' with distant supervision and improved gait pattern with decreased L knee hyperextension in swing phase.  NMR for LLE on nustep at level 6 x15 minutes for reciprocal stepping pattern and attention to L ankle position.  PT instructed pt in NMR therex in // bars and on mat minisquats x15, split stance mini squats x15, heel raises, BLE support bridges with pillow squeeze, and LLE support bridges (x10) all with min multimodal cues for correct form and attention to L ankle position.   Pt returned to room at end of session and positioned in recliner with call bell in reach and needs met.   Therapy Documentation Precautions:  Precautions Precautions: Fall Required Braces or Orthoses: Other Brace/Splint Other Brace/Splint: L aircast for ankle instability Restrictions Weight Bearing Restrictions: No   See Function Navigator for Current Functional Status.   Therapy/Group: Individual Therapy  Loella Hickle E Penven-Crew 04/01/2016, 11:25 AM

## 2016-04-01 NOTE — Progress Notes (Signed)
Physical Therapy Session Note  Patient Details  Name: Richard Alvarado MRN: [id number] Date of Birth: Jul 16, 1940  Today's Date: 04/01/2016 PT Individual Time: [id number] PT Individual Time Calculation (min): 57 min    Short Term Goals: Week 1:  PT Short Term Goal 1 (Week 1): =LTGs due to ELOS  Skilled Therapeutic Interventions/Progress Updates:    Focused on functional gait and balance activities during [place] while playing cornhole game including picking up bean bags off of the floor without use of AD for balance during activities with overall min assist.  Gait on unit with SPC with close supervision; occasional cues for attention to L foot for clearance and knee control. Pt practiced gait up/down ramp and over mulched surface to simulate home and community mobility to prepare for community outing tomorrow. Discussed d/c goals and plans and pt feels prepared for upcoming D/C.    Reassessed balance assessments: see results below and discussed fall risk assessment.  Administered Normal TUG (without AD) Trial 1: 14 sec; Trial 2: 13 sec ; Trial 3:13 sec; average = 13.33 sec  Five times Sit to Stand Test (FTSS) Method: Use a straight back chair with a solid seat that is 16-18" high. Ask participant to sit on the chair with arms folded across their chest.   Instructions: "Stand up and sit down as quickly as possible 5 times, keeping your arms folded across your chest."   Measurement: Stop timing when the participant stands the 5th time.  TIME: __13____ (in seconds)  Times > 13.6 seconds is associated with increased disability and morbidity (Guralnik, 2000) Times > 15 seconds is predictive of recurrent falls in healthy individuals aged 51 and older (Buatois, et al., 2008) Normal performance values in community dwelling individuals aged 87 and older (Bohannon, 2006): o 60-69 years: 11.4 seconds o 70-79 years: 12.6 seconds o 80-89 years: 14.8 seconds  MCID: ? 2.3 seconds for  Vestibular Disorders Mariah Milling, 2006)    Therapy Documentation Precautions:  Precautions Precautions: Fall Required Braces or Orthoses: Other Brace/Splint Other Brace/Splint: L aircast for ankle instability Restrictions Weight Bearing Restrictions: No  Pain:  NO complaints.    See Function Navigator for Current Functional Status.   Therapy/Group: Individual Therapy  Canary Brim Ivory Broad, PT, DPT  04/01/2016, 3:32 PM

## 2016-04-02 ENCOUNTER — Inpatient Hospital Stay (HOSPITAL_COMMUNITY): Payer: Medicare Other | Admitting: Physical Therapy

## 2016-04-02 ENCOUNTER — Inpatient Hospital Stay (HOSPITAL_COMMUNITY): Payer: Medicare Other

## 2016-04-02 ENCOUNTER — Inpatient Hospital Stay (HOSPITAL_COMMUNITY): Payer: Medicare Other | Admitting: Occupational Therapy

## 2016-04-02 LAB — GLUCOSE, CAPILLARY
Glucose-Capillary: 109 mg/dL — ABNORMAL HIGH (ref 65–99)
Glucose-Capillary: 98 mg/dL (ref 65–99)
Glucose-Capillary: 97 mg/dL (ref 65–99)
Glucose-Capillary: 87 mg/dL (ref 65–99)

## 2016-04-02 NOTE — Progress Notes (Signed)
Social Work Elease Hashimoto, LCSW Social Worker Signed   Patient Care Conference Date of Service: 04/02/2016  1:02 PM      Hide copied text Hover for attribution information Inpatient RehabilitationTeam Conference and Plan of Care Update Date: 04/02/2016   Time: 11:00 AM     Patient Name: Richard Alvarado      Medical Record Number: [id number]  Date of Birth: 76/29/1942 Sex: Male         Room/Bed: 4M07C/4M07C-01 Payor Info: Payor: MEDICARE / Plan: MEDICARE PART A AND B / Product Type: *No Product type* /     Admitting Diagnosis: ICH  Admit Date/Time:  03/24/2016  2:24 PM Admission Comments: No comment available    Primary Diagnosis:  <principal problem not specified> Principal Problem: <principal problem not specified>       Patient Active Problem List    Diagnosis Date Noted  . DM (diabetes mellitus), type 2 with neurological complications ([place])    . Essential hypertension    . Family history of stroke 03/24/2016  . OSA (obstructive sleep apnea) 03/24/2016  . Hypokalemia 03/24/2016  . Nontraumatic acute hemorrhage of basal ganglia ([place]) 03/24/2016  . Ataxia    . ICH (intracerebral hemorrhage) ([place]) - R basal ganglia 03/22/2016  . Hypertensive emergency    . Diabetes mellitus type 2 in nonobese ([place]) 03/19/2016  . Pacemaker battery depletion 11/14/2014  . Hyperlipidemia 11/01/2013  . Severe hypertension 04/24/2013  . Pericardial effusion secondary to minoxidil 04/24/2013  . History of CVA (cerebrovascular accident) 04/24/2013  . Second degree heart block 04/24/2013  . Pacemaker St. Jude victory, dual chamber, 2006 04/24/2013  . Cervicalgia 01/01/2011  . Muscle weakness (generalized) 01/01/2011      Expected Discharge Date: Expected Discharge Date: 04/03/16   Team Members Present: Physician leading conference: Dr. Alysia Penna Social Worker Present: Ovidio Kin, LCSW Nurse Present: Other (comment) (Jeniffer Bailey-RN) PT Present: Dwyane Dee, PT OT  Present: Simonne Come, OT SLP Present: Windell Moulding, SLP PPS Coordinator present : Daiva Nakayama, RN, CRRN       Current Status/Progress Goal Weekly Team Focus  Medical   still with reduced sensation LLE  BP goals achieved  d/c planning   Bowel/Bladder   continent bowel and bladder; LBM 10/22  mod I  monitor for any changes in continence   Swallow/Nutrition/ Hydration             ADL's   supervision LB dressing, Mod I UB dressing and bathing, supervision transfers  Mod I overall, supervision tub/shower transfers and IADLs  Lt attention/proprioception, home management, ADL retraining, d/c planning   Mobility   distant supervision>mod I  mod I with LRAD, supervision in community  grad day 10/25   Communication             Safety/Cognition/ Behavioral Observations           Pain   No pain  No pain      Skin   no abnormalities  No abnormalities          *See Care Plan and progress notes for long and short-term goals.   Barriers to Discharge: none except complete family training   Possible Resolutions to Barriers:  D/C in am   Discharge Planning/Teaching Needs:  Preparing for home tomorrow, has reached his goals and is ready to discharge home. Wife has been in for therapies.      Team Discussion:  Progressing toward his supervision/mod/i level goals. Outing today went well. Cardiology  has signed off. Medically ready for DC tomorrow. L-inattention and balance is improved.  Revisions to Treatment Plan:  DC 10/26    Continued Need for Acute Rehabilitation Level of Care: The patient requires daily medical management by a physician with specialized training in physical medicine and rehabilitation for the following conditions: Daily direction of a multidisciplinary physical rehabilitation program to ensure safe treatment while eliciting the highest outcome that is of practical value to the patient.: Yes Daily medical management of patient stability for increased activity during participation  in an intensive rehabilitation regime.: Yes Daily analysis of laboratory values and/or radiology reports with any subsequent need for medication adjustment of medical intervention for : Neurological problems;Blood pressure problems   Danel Requena, Gardiner Rhyme 04/02/2016, 1:02 PM      Elease Hashimoto, LCSW Social Worker Signed   Patient Care Conference Date of Service: 03/26/2016  1:35 PM      Hide copied text Hover for attribution information Inpatient RehabilitationTeam Conference and Plan of Care Update Date: 03/26/2016   Time: 11:15 AM      Patient Name: Richard Alvarado      Medical Record Number: [id number]  Date of Birth: [birth year] Sex: Male         Room/Bed: 4W22C/4W22C-01 Payor Info: Payor: MEDICARE / Plan: MEDICARE PART A AND B / Product Type: *No Product type* /     Admitting Diagnosis: ICH  Admit Date/Time:  03/24/2016  2:24 PM Admission Comments: No comment available    Primary Diagnosis:  <principal problem not specified> Principal Problem: <principal problem not specified>       Patient Active Problem List    Diagnosis Date Noted  . Family history of stroke 03/24/2016  . OSA (obstructive sleep apnea) 03/24/2016  . Hypokalemia 03/24/2016  . Nontraumatic acute hemorrhage of basal ganglia ([place]) 03/24/2016  . Ataxia    . ICH (intracerebral hemorrhage) ([place]) - R basal ganglia 03/22/2016  . Hypertensive emergency    . Diabetes mellitus type 2 in nonobese ([place]) 03/19/2016  . Pacemaker battery depletion 11/14/2014  . Hyperlipidemia 11/01/2013  . Severe hypertension 04/24/2013  . Pericardial effusion secondary to minoxidil 04/24/2013  . History of CVA (cerebrovascular accident) 04/24/2013  . Second degree heart block 04/24/2013  . Pacemaker St. Jude victory, dual chamber, 2006 04/24/2013  . Cervicalgia 01/01/2011  . Muscle weakness (generalized) 01/01/2011      Expected Discharge Date: Expected Discharge Date: 04/03/16   Team Members Present: Physician leading  conference: Dr. Alysia Penna Social Worker Present: Ovidio Kin, LCSW Nurse Present: Heather Roberts, RN PT Present: Dwyane Dee, PT OT Present: Simonne Come, OT SLP Present: Windell Moulding, SLP PPS Coordinator present : Daiva Nakayama, RN, CRRN       Current Status/Progress Goal Weekly Team Focus  Medical   Elevated BPs,   Goal of BP in hospital 160/90  med managment   Bowel/Bladder   continent bowel and bladder 1 episode incontinence reported LBM 10-16   mod I  monitor issues with bowel and bladder   Swallow/Nutrition/ Hydration   Patient and family report he drinks 10-12 bottles of water daily plus tea and other beverages. To help decrease BP patient should limit fluids.  Limit fluid intake to 2 liters per day and perform daily weights.      ADL's   Needs minimal to moderate assistance with adl's.  Will be independent with all adl's that he did prior to admission.      Mobility   min assist ovrall  mod I overall with LRAD  balance, gait training with LRAD, endurance, NMR for L side   Communication   Hard of hearing         Safety/Cognition/ Behavioral Observations           Pain   No pain  No pain      Skin   No abnormalities  No abnormalities          *See Care Plan and progress notes for long and short-term goals.   Barriers to Discharge: some ataxia on left   Possible Resolutions to Barriers:  cont rehab   Discharge Planning/Teaching Needs:  Home with wife who is a retired Therapist, sports and can provide care.   BP medications, when to assess BP at home, low sodium and cholesterol diet to improve BP, and limiting fluids to help control BP.   Team Discussion:  MD working on BP and medications-still running on the high side. Goals mod/i level with therapy team making good progress. Pt motivated and doing well in therapies, even with BP issues.  Revisions to Treatment Plan:  DC 10/26    Continued Need for Acute Rehabilitation Level of Care: The patient requires daily medical  management by a physician with specialized training in physical medicine and rehabilitation for the following conditions: Daily direction of a multidisciplinary physical rehabilitation program to ensure safe treatment while eliciting the highest outcome that is of practical value to the patient.: Yes Daily medical management of patient stability for increased activity during participation in an intensive rehabilitation regime.: Yes Daily analysis of laboratory values and/or radiology reports with any subsequent need for medication adjustment of medical intervention for : Neurological problems   Elease Hashimoto 03/26/2016, 1:35 PM       Patient ID: TASEEN ASIA, male   DOB: Jun 22, 1940, 75 y.o.   MRN: [id number]

## 2016-04-02 NOTE — Progress Notes (Signed)
Social Work Patient ID: Richard Alvarado, male   DOB: 1941-01-31, 75 y.o.   MRN: [id number]  Met with pt to discuss team conference progression toward his goals of supervision/mod/i level and discharge tomorrow. He just went on an outing and reports it went well, he feels more confident about going home tomorrow. He is agreeable to going to OP therapies, have faxed information to [facility] they will contact pt or wife To schedule appointments. Have also ordered a cane from [facility], this is all pt needs. Will work toward discharge tomorrow. Wife aware and agreeable to plan.

## 2016-04-02 NOTE — Plan of Care (Signed)
Problem: RH Floor Transfers Goal: LTG Patient will perform floor transfers w/assist (PT) LTG: Patient will perform floor transfers with assistance (PT).  Outcome: Not Met (add Reason) Did not attempt    

## 2016-04-02 NOTE — Progress Notes (Signed)
Subjective/Complaints: No issues overnite  ROS:  Denies CP, SOB, N/V/D  Objective: Vital Signs: Blood pressure (!) 148/68, pulse 64, temperature 97.8 F (36.6 C), temperature source Oral, resp. rate 18, height 5' 10"  (1.778 m), weight 93.7 kg (206 lb 9.6 oz), SpO2 100 %. No results found. Results for orders placed or performed during the hospital encounter of 03/24/16 (from the past 72 hour(s))  Glucose, capillary     Status: Abnormal   Collection Time: 03/30/16 11:35 AM  Result Value Ref Range   Glucose-Capillary 174 (H) 65 - 99 mg/dL  Glucose, capillary     Status: Abnormal   Collection Time: 03/30/16  4:19 PM  Result Value Ref Range   Glucose-Capillary 158 (H) 65 - 99 mg/dL  Glucose, capillary     Status: Abnormal   Collection Time: 03/30/16  8:34 PM  Result Value Ref Range   Glucose-Capillary 101 (H) 65 - 99 mg/dL  Glucose, capillary     Status: None   Collection Time: 03/31/16  6:38 AM  Result Value Ref Range   Glucose-Capillary 95 65 - 99 mg/dL  Glucose, capillary     Status: Abnormal   Collection Time: 03/31/16 12:10 PM  Result Value Ref Range   Glucose-Capillary 129 (H) 65 - 99 mg/dL   Comment 1 Notify RN   Glucose, capillary     Status: None   Collection Time: 03/31/16  4:43 PM  Result Value Ref Range   Glucose-Capillary 99 65 - 99 mg/dL   Comment 1 Notify RN   Glucose, capillary     Status: Abnormal   Collection Time: 03/31/16  9:00 PM  Result Value Ref Range   Glucose-Capillary 106 (H) 65 - 99 mg/dL  Glucose, capillary     Status: Abnormal   Collection Time: 04/01/16  6:36 AM  Result Value Ref Range   Glucose-Capillary 107 (H) 65 - 99 mg/dL  Glucose, capillary     Status: None   Collection Time: 04/01/16 11:35 AM  Result Value Ref Range   Glucose-Capillary 98 65 - 99 mg/dL  Glucose, capillary     Status: Abnormal   Collection Time: 04/01/16  5:10 PM  Result Value Ref Range   Glucose-Capillary 102 (H) 65 - 99 mg/dL   Comment 1 Notify RN   Glucose,  capillary     Status: None   Collection Time: 04/01/16  8:04 PM  Result Value Ref Range   Glucose-Capillary 92 65 - 99 mg/dL  Glucose, capillary     Status: Abnormal   Collection Time: 04/02/16  6:29 AM  Result Value Ref Range   Glucose-Capillary 109 (H) 65 - 99 mg/dL     HEENT: normocephalic, atraumatic.  Cardio: RRR and no murmur Resp: B/l CTA GI: BS positive and NT, ND Skin:   Intact. Warm and dry. Neuro: Alert/Oriented,  Motor: 4-4+/5 Left Delt bi, tri, grip, HF, KE, ADF  Musc/Skel:  No edema. No tenderness. Gen NAD. Vital signs reviewed.    Assessment/Plan: 1. Functional deficits secondary to Right basal ganglia ICH which require 3+ hours per day of interdisciplinary therapy in a comprehensive inpatient rehab setting. Physiatrist is providing close team supervision and 24 hour management of active medical problems listed below. Physiatrist and rehab team continue to assess barriers to discharge/monitor patient progress toward functional and medical goals. FIM: Function - Bathing Position: Shower Body parts bathed by patient: Right arm, Left arm, Chest, Abdomen, Front perineal area, Buttocks, Right upper leg, Left upper leg, Right lower leg,  Left lower leg Body parts bathed by helper: Back Assist Level: More than reasonable time  Function- Upper Body Dressing/Undressing What is the patient wearing?: Pull over shirt/dress Pull over shirt/dress - Perfomed by patient: Thread/unthread right sleeve, Thread/unthread left sleeve, Put head through opening, Pull shirt over trunk Button up shirt - Perfomed by patient: Thread/unthread right sleeve, Thread/unthread left sleeve, Pull shirt around back, Button/unbutton shirt Assist Level: More than reasonable time Set up : To obtain clothing/put away Function - Lower Body Dressing/Undressing What is the patient wearing?: Underwear, Pants, Socks, Shoes Position:  (recliner) Underwear - Performed by patient: Thread/unthread right  underwear leg, Thread/unthread left underwear leg, Pull underwear up/down Underwear - Performed by helper: Thread/unthread left underwear leg Pants- Performed by patient: Thread/unthread right pants leg, Thread/unthread left pants leg, Pull pants up/down Pants- Performed by helper: Thread/unthread left pants leg Socks - Performed by patient: Don/doff right sock, Don/doff left sock Shoes - Performed by patient: Don/doff right shoe, Fasten right, Fasten left, Don/doff left shoe Shoes - Performed by helper: Don/doff left shoe Assist for footwear: Supervision/touching assist Assist for lower body dressing: Supervision or verbal cues Set up : To obtain clothing/put away (don ankle support)  Function - Toileting Toileting steps completed by patient: Adjust clothing prior to toileting, Performs perineal hygiene, Adjust clothing after toileting Toileting steps completed by helper: Performs perineal hygiene, Adjust clothing after toileting (per [employer], NT assist past bm) Toileting Assistive Devices: Grab bar or rail Assist level: Supervision or verbal cues  Function - Air cabin crew transfer assistive device: Grab bar Assist level to toilet: Touching or steadying assistance (Pt > 75%) Assist level from toilet: Touching or steadying assistance (Pt > 75%) Assist level to bedside commode (at bedside): Maximal assist (Pt 25 - 49%/lift and lower) (per [employer], NT) Assist level from bedside commode (at bedside): Maximal assist (Pt 25 - 49%/lift and lower) (per [employer], NT)  Function - Chair/bed transfer Chair/bed transfer method: Ambulatory Chair/bed transfer assist level: Supervision or verbal cues Chair/bed transfer assistive device: Armrests, Cane  Function - Locomotion: Wheelchair Will patient use wheelchair at discharge?: No Type: Manual Max wheelchair distance: 160 Assist Level: Supervision or verbal cues Assist Level: Supervision or verbal cues Assist Level:  Supervision or verbal cues Turns around,maneuvers to table,bed, and toilet,negotiates 3% grade,maneuvers on rugs and over doorsills: No Function - Locomotion: Ambulation Assistive device: No device, Cane-straight Max distance: 150 with no device, 300+ with SPC Assist level: Supervision or verbal cues Assist level: Supervision or verbal cues Assist level: Supervision or verbal cues Walk 150 feet activity did not occur: Safety/medical concerns (endurance') Assist level: Supervision or verbal cues Assist level: Supervision or verbal cues  Function - Comprehension Comprehension: Auditory Comprehension assist level: Follows complex conversation/direction with no assist  Function - Expression Expression: Verbal Expression assist level: Expresses complex ideas: With no assist  Function - Social Interaction Social Interaction assist level: Interacts appropriately with others - No medications needed.  Function - Problem Solving Problem solving assist level: Solves complex problems: Recognizes & self-corrects  Function - Memory Memory assist level: Complete Independence: No helper Patient normally able to recall (first 3 days only): Current season, Location of own room, Staff names and faces, That he or she is in a hospital Medical Problem List and Plan: 1.  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 Formulized individual treatment plan based on medical history, underlying problem and comorbidities. 2.  DVT Prophylaxis/Anticoagulation: Mechanical: Sequential compression devices, below knee Bilateral lower extremities-started lovenox  3. Pain Management:  N/A 4. Mood: LCSW to follow for evaluation and support.  5. Neuropsych: This patient is capable of making decisions on his own  behalf. 6. Skin/Wound Care: Routine pressure relief measures 7. Fluids/Electrolytes/Nutrition: Monitor I/O. BUN/Cr nl 8.HTN: monitor BP tid. Continue Norvasc 74m, catapress .237mTID, lasix, bystolic 1003TCnd losartan 10068mID. Added prn clonidine for sys BP>160, Cardiology now signed off  Vitals:   04/01/16 1412 04/02/16 0534  BP: (!) 142/71 (!) 148/68  Pulse: 62 64  Resp: 18 18  Temp: 98.4 F (36.9 C) 97.8 F (36.6 C)   9.T2DM: Monitor BS ac/hs. Appetite has been good.   Continue metformin   Glipizide decreased to 5mg32mID on 10/22, well controlled at present CBG (last 3)   Recent Labs  04/01/16 1710 04/01/16 2004 04/02/16 0629  GLUCAP 102* 92 109*   10 Dyslipidemia: Resume at discharge.   LOS (Days) 9 A FACE TO FACE EVALUATION WAS PERFORMED  KIRSTEINS,ANDREW E 04/02/2016, 7:44 AM

## 2016-04-02 NOTE — Patient Care Conference (Signed)
Inpatient RehabilitationTeam Conference and Plan of Care Update Date: 04/02/2016   Time: 11:00 AM   Patient Name: Richard Alvarado      Medical Record Number: [id number]  Date of Birth: 1941/05/23 Sex: Male         Room/Bed: 4M07C/4M07C-01 Payor Info: Payor: MEDICARE / Plan: MEDICARE PART A AND B / Product Type: *No Product type* /    Admitting Diagnosis: ICH  Admit Date/Time:  03/24/2016  2:24 PM Admission Comments: No comment available   Primary Diagnosis:  <principal problem not specified> Principal Problem: <principal problem not specified>  Patient Active Problem List   Diagnosis Date Noted  . DM (diabetes mellitus), type 2 with neurological complications ([place])   . Essential hypertension   . Family history of stroke 03/24/2016  . OSA (obstructive sleep apnea) 03/24/2016  . Hypokalemia 03/24/2016  . Nontraumatic acute hemorrhage of basal ganglia ([place]) 03/24/2016  . Ataxia   . ICH (intracerebral hemorrhage) ([place]) - R basal ganglia 03/22/2016  . Hypertensive emergency   . Diabetes mellitus type 2 in nonobese ([place]) 03/19/2016  . Pacemaker battery depletion 11/14/2014  . Hyperlipidemia 11/01/2013  . Severe hypertension 04/24/2013  . Pericardial effusion secondary to minoxidil 04/24/2013  . History of CVA (cerebrovascular accident) 04/24/2013  . Second degree heart block 04/24/2013  . Pacemaker St. Jude victory, dual chamber, 2006 04/24/2013  . Cervicalgia 01/01/2011  . Muscle weakness (generalized) 01/01/2011    Expected Discharge Date: Expected Discharge Date: 04/03/16  Team Members Present: Physician leading conference: Dr. Alysia Penna Social Worker Present: Ovidio Kin, LCSW Nurse Present: Other (comment) (Jeniffer Bailey-RN) PT Present: Dwyane Dee, PT OT Present: Simonne Come, OT SLP Present: Windell Moulding, SLP PPS Coordinator present : Daiva Nakayama, RN, CRRN     Current Status/Progress Goal Weekly Team Focus  Medical   still with reduced sensation  LLE  BP goals achieved  d/c planning   Bowel/Bladder   continent bowel and bladder; LBM 10/22  mod I  monitor for any changes in continence   Swallow/Nutrition/ Hydration             ADL's   supervision LB dressing, Mod I UB dressing and bathing, supervision transfers  Mod I overall, supervision tub/shower transfers and IADLs  Lt attention/proprioception, home management, ADL retraining, d/c planning   Mobility   distant supervision>mod I  mod I with LRAD, supervision in community  grad day 10/25   Communication             Safety/Cognition/ Behavioral Observations            Pain   No pain  No pain      Skin   no abnormalities  No abnormalities         *See Care Plan and progress notes for long and short-term goals.  Barriers to Discharge: none except complete family training    Possible Resolutions to Barriers:  D/C in am    Discharge Planning/Teaching Needs:  Preparing for home tomorrow, has reached his goals and is ready to discharge home. Wife has been in for therapies.      Team Discussion:  Progressing toward his supervision/mod/i level goals. Outing today went well. Cardiology has signed off. Medically ready for DC tomorrow. L-inattention and balance is improved.  Revisions to Treatment Plan:  DC 10/26   Continued Need for Acute Rehabilitation Level of Care: The patient requires daily medical management by a physician with specialized training in physical medicine and rehabilitation for the following  conditions: Daily direction of a multidisciplinary physical rehabilitation program to ensure safe treatment while eliciting the highest outcome that is of practical value to the patient.: Yes Daily medical management of patient stability for increased activity during participation in an intensive rehabilitation regime.: Yes Daily analysis of laboratory values and/or radiology reports with any subsequent need for medication adjustment of medical intervention for :  Neurological problems;Blood pressure problems  Ethell Blatchford, Gardiner Rhyme 04/02/2016, 1:02 PM

## 2016-04-02 NOTE — Progress Notes (Signed)
Physical Therapy Note  Patient Details  Name: Richard Alvarado MRN: [id number] Date of Birth: [birth year] Today's Date: 04/02/2016  1300-1330, 30 min individual tx Pain: none reported  Gait on level tile and up/down ramp and curb.  Pt needed min assist on curb due to L foot not completely clearing step as he stepped down; with repetition, he needed close supervision and cues for sequencing.  Gait on mulched area with close supervision, up/down 12 steps 1 rail, step -to technique with supervision and rare cue.  . neuromuscular re-education for coordination with seatedalternating toe raises/heel raises x 30 cycles each, bil hip adduction against resistance x 20 cycles . Pt left resting in recliner all needs within reach.  Pratik Dalziel 04/02/2016, 12:49 PM

## 2016-04-02 NOTE — Progress Notes (Signed)
Occupational Therapy Session Note  Patient Details  Name: ARLOW COACHMAN MRN: [id number] Date of Birth: 06-Aug-1940  Today's Date: 04/02/2016 OT Individual Time: 0800-0900 and 1400-1428 OT Individual Time Calculation (min): 60 min and 28 min    Short Term Goals: Week 1:  OT Short Term Goal 1 (Week 1): STGs=LTGs secondary to estimated LOS  Skilled Therapeutic Interventions/Progress Updates:    1) Completed ADL retraining at overall Mod I level with use of single point cane with mobility and transfers.  Donned jeans with increased time but without assistance.  Engaged in simple meal prep and laundry tasks with ADL apt with focus on attention to LLE during mobility, especially when bending to retrieve or place items in washer/dryer or lower cabinets and drawers.  Pt obtained food from refrigerator with use of LUE to transport full cup of water and retreive items from cabinets to simulate simple meal prep.  Pt reports he does not do any cooking but will retrieve items from refrigerator.  Discussed attention to LUE during mobility, especially when carrying items.  Engaged in therapeutic activity with focus on trunk control and dynamic balance with PNF pattern reaching with 4# medicine ball, pt demonstrated mild difficulty with problem solving when changing directions requiring cues and clarification. Discussed upcoming outing and d/c tomorrow with pt reporting he is ready to go home.  2) Treatment session with focus on functional mobility and transfers in home environment with single point cane.  Pt ambulated from room to ADL apt with cane without assist.  Completed tub/shower transfers with stepping in over tub ledge with use of suction cup grab bar and supervision. Reiterated recommendation for wife to be present during tub/shower transfers with pt reporting understanding.  Completed toilet transfers without assist.  Discussed community outing, with pt reporting pleased with progress and ability to  complete all that was asked of him during outing.  Discussed recommendation to slow down during mobility, especially in community setting as well as to increase stability when transporting items.  Engaged in ball toss and bounce/catch with focus on motor control with LUE, again discussing pace of activity to increase success.  Returned to room ambulating while carrying full cup in Lt hand with good control.  Pt reports no further questions and ready for d/c.  Therapy Documentation Precautions:  Precautions Precautions: Fall Required Braces or Orthoses: Other Brace/Splint Other Brace/Splint: L aircast for ankle instability Restrictions Weight Bearing Restrictions: No Pain: Pain Assessment Pain Assessment: No/denies pain  See Function Navigator for Current Functional Status.   Therapy/Group: Individual Therapy  Varnika Butz, [place] 04/02/2016, 11:11 AM

## 2016-04-02 NOTE — Progress Notes (Signed)
Recreational Therapy Discharge Summary Patient Details  Name: Richard Alvarado MRN: [id number] Date of Birth: December 26, 1940 Today's Date: 04/02/2016  Long term goals set: 1  Long term goals met:1 Comments on progress toward goals: Pt has made excellent progress toward goal during LOS and is ready for discharge home with wife/family to provide 24 hour supervision.  Pt participated in community reintegration at overall supervision ambulatory level using SPC.  Reasons for discharge: discharge from hospital  Patient/family agrees with progress made and goals achieved: Yes    Session Note:  04/02/16 Pain:no  c/o Pt participated in community reintegration/outing to [employer] at overall supervision ambulatory level using SPC.  Goals focused on safe functional mobility in community setting, identification & negotiation of obstacles, accessing public restroom,  & energy conservation. See outing goal sheet in shadow chart for full details.  Alexsys Eskin 04/02/2016, 9:19 AM

## 2016-04-02 NOTE — Progress Notes (Signed)
Physical Therapy Session Note  Patient Details  Name: Richard Alvarado MRN: [id number] Date of Birth: 09-23-1940  Today's Date: 04/02/2016 PT Individual Time: 1030-1150 PT Individual Time Calculation (min): 80 min   Short Term Goals: Week 1:  PT Short Term Goal 1 (Week 1): =LTGs due to ELOS   Skilled Therapeutic Interventions/Progress Updates:  Pt participated in community outing with PT and Recreation therapist to large store.  Pt performed transfers in/out of van with use of tall steps, cane and pole with supervision-min A but able to transfer on/off seat Mod I.  Pt performed gait over 500' in community outside on uneven pavement and indoors with [facility] and with shopping cart for energy conservation with supervision.  Pt able to navigate safely around other shoppers and obstacles in aisles; pt able to reach to high and low shelves safely to retrieve items with LUE and carry shopping bag in LUE back to [place].  Pt able to verbalize energy conservation techniques and discussed how to access public restroom safely.  After returning to rehab pt returned to room Mod I in controlled environment and performed re-assessment of LE strength.  Pt left in recliner with all items within reach.    Therapy Documentation Precautions:  Precautions Precautions: Fall Required Braces or Orthoses: Other Brace/Splint Other Brace/Splint: Lt aircast for ankle instability and decreased proprioception Restrictions Weight Bearing Restrictions: No Vital Signs: Therapy Vitals Temp: 98.2 F (36.8 C) Temp Source: Oral Pulse Rate: 63 Resp: 18 BP: (!) 160/74 Patient Position (if appropriate): Sitting Oxygen Therapy SpO2: 100 % O2 Device: Not Delivered Pain: Pain Assessment Pain Assessment: No/denies pain   See Function Navigator for Current Functional Status.   Therapy/Group: Individual Therapy  Raylene Everts [facility] 04/02/2016, 4:46 PM

## 2016-04-02 NOTE — Progress Notes (Signed)
Physical Therapy Discharge Summary  Patient Details  Name: Richard Alvarado MRN: [id number] Date of Birth: 11-12-1940  Today's Date: 04/02/2016   Patient has met 7 of 8 long term goals due to improved activity tolerance, improved balance, improved postural control, increased strength, increased range of motion, improved attention, improved awareness and improved coordination.  Patient to discharge at an ambulatory level Modified Independent.     Reasons goals not met: Did not attempt floor transfer this admission.   Recommendation:  Patient will benefit from ongoing skilled PT services in outpatient setting to continue to advance safe functional mobility, address ongoing impairments in coordination, proprioception, attention, strength, and balance, and minimize fall risk.  Equipment: spc  Reasons for discharge: treatment goals met  Patient/family agrees with progress made and goals achieved: Yes   PT Discharge Precautions/RestrictionsPrecautions Precautions: Fall Required Braces or Orthoses: Other Brace/Splint Other Brace/Splint: Lt aircast for ankle instability and decreased proprioception Pain Pain Assessment Pain Assessment: No/denies pain  Cognition Overall Cognitive Status: Within Functional Limits for tasks assessed Arousal/Alertness: Awake/alert Orientation Level: Oriented X4 Attention: Alternating Alternating Attention: Appears intact Memory: Appears intact Awareness: Impaired Awareness Impairment: Anticipatory impairment Problem Solving: Appears intact Safety/Judgment: Appears intact Sensation Sensation Light Touch: Impaired by gross assessment Light Touch Impaired Details: Impaired LLE Proprioception: Impaired by gross assessment (L ankle impaired) Additional Comments: improving sensation to LT on LLE however pt still reports decreased sensation in LLE compared to RLE Coordination Gross Motor Movements are Fluid and Coordinated: Yes Fine Motor Movements  are Fluid and Coordinated: No Finger Nose Finger Test: WFL on RUE, mild dysmetria on LUE 9 Hole Peg Test: Rt: 26 seconds and Lt: 28 seconds Motor  Motor Motor - Discharge Observations: L hemiparesis, decreased proprioception in L ankle  Mobility Bed Mobility Supine to Sit: 6: Modified independent (Device/Increase time) Transfers Sit to Stand: 6: Modified independent (Device/Increase time) Stand to Sit: 6: Modified independent (Device/Increase time) Locomotion  Ambulation Ambulation: Yes Ambulation/Gait Assistance: 6: Modified independent (Device/Increase time) Ambulation Distance (Feet): 500 Feet Assistive device: Straight cane Ambulation/Gait Assistance Details: supervision in community setting, mod I in controlled and home environment Gait Gait: Yes Gait Pattern: Impaired Gait Pattern:  (decreased ankle stability in stance phase) Stairs / Additional Locomotion Stairs: Yes Stairs Assistance: 5: Supervision Stair Management Technique: One rail Left Number of Stairs: 12 Wheelchair Mobility Wheelchair Mobility: No  Trunk/Postural Assessment  Cervical Assessment Cervical Assessment: Within Functional Limits Thoracic Assessment Thoracic Assessment: Within Functional Limits Lumbar Assessment Lumbar Assessment: Within Functional Limits Postural Control Postural Control: Within Functional Limits  Balance Static Standing Balance Static Standing - Balance Support: Right upper extremity supported;Left upper extremity supported;No upper extremity supported;During functional activity Static Standing - Level of Assistance: 6: Modified independent (Device/Increase time) Dynamic Standing Balance Dynamic Standing - Balance Support: Right upper extremity supported;Left upper extremity supported;During functional activity;No upper extremity supported Dynamic Standing - Level of Assistance: 6: Modified independent (Device/Increase time) Extremity Assessment  RUE Assessment RUE  Assessment: Within Functional Limits LUE Assessment LUE Assessment: Within Functional Limits (mild incoordination, however AROM and strength WFL) RLE Assessment RLE Assessment: Within Functional Limits LLE Assessment LLE Assessment: Exceptions to [facility] (4/5)   See Function Navigator for Current Functional Status.  Atiyana Welte E Penven-Crew 04/02/2016, 5:00 PM

## 2016-04-02 NOTE — Progress Notes (Signed)
Occupational Therapy Discharge Summary  Patient Details  Name: Richard Alvarado MRN: [id number] Date of Birth: 25-Jul-1940   Patient has met 10 of 10 long term goals due to improved activity tolerance, improved balance, postural control, ability to compensate for deficits, functional use of  LEFT upper and LEFT lower extremity, improved awareness and improved coordination.  Patient to discharge at overall Modified Independent level for basic ADLs, and Supervision tub/shower transfer and IADLs.  Patient's care partner is independent to provide the necessary assistance/cues for LLE placement at discharge.    Reasons goals not met: N/A  Recommendation:  Patient will benefit from ongoing skilled OT services in outpatient setting to continue to advance functional skills in the area of BADL, iADL and Reduce care partner burden.  Equipment: No equipment provided  Reasons for discharge: treatment goals met and discharge from hospital  Patient/family agrees with progress made and goals achieved: Yes  OT Discharge Precautions/Restrictions  Precautions Precautions: Fall Required Braces or Orthoses: Other Brace/Splint Other Brace/Splint: Lt aircast for ankle instability and decreased proprioception General   Vital Signs Therapy Vitals Temp: 98.2 F (36.8 C) Temp Source: Oral Pulse Rate: 63 Resp: 18 BP: (!) 160/74 Patient Position (if appropriate): Sitting Oxygen Therapy SpO2: 100 % O2 Device: Not Delivered Pain Pain Assessment Pain Assessment: No/denies pain ADL  See Function Navigator Vision/Perception  Vision- History Baseline Vision/History: Wears glasses Wears Glasses: Reading only Patient Visual Report: No change from baseline Vision- Assessment Vision Assessment?: No apparent visual deficits  Cognition Overall Cognitive Status: Within Functional Limits for tasks assessed Arousal/Alertness: Awake/alert Orientation Level: Oriented X4 Attention:  Alternating Alternating Attention: Appears intact Memory: Appears intact Awareness: Impaired Awareness Impairment: Anticipatory impairment Problem Solving: Appears intact Safety/Judgment: Appears intact Sensation Sensation Light Touch: Impaired by gross assessment Light Touch Impaired Details: Impaired LLE Proprioception: Impaired by gross assessment (impaired LLE) Coordination Gross Motor Movements are Fluid and Coordinated: Yes Fine Motor Movements are Fluid and Coordinated: No Finger Nose Finger Test: WFL on RUE, mild dysmetria on LUE 9 Hole Peg Test: Rt: 26 seconds and Lt: 28 seconds Extremity/Trunk Assessment RUE Assessment RUE Assessment: Within Functional Limits LUE Assessment LUE Assessment: Within Functional Limits (mild incoordination, however AROM and strength WFL)   See Function Navigator for Current Functional Status.  Simonne Come 04/02/2016, 3:00 PM

## 2016-04-03 LAB — GLUCOSE, CAPILLARY: Glucose-Capillary: 94 mg/dL (ref 65–99)

## 2016-04-03 MED ORDER — FUROSEMIDE 40 MG PO TABS: 40.0000 mg | ORAL_TABLET | Freq: Every day | ORAL | 0 refills | Status: DC

## 2016-04-03 MED ORDER — LOSARTAN POTASSIUM 100 MG PO TABS: 100.0000 mg | ORAL_TABLET | Freq: Two times a day (BID) | ORAL | 0 refills | Status: DC

## 2016-04-03 MED ORDER — PANTOPRAZOLE SODIUM 40 MG PO TBEC: 40.0000 mg | DELAYED_RELEASE_TABLET | Freq: Every day | ORAL | 0 refills | Status: AC

## 2016-04-03 MED ORDER — SENNOSIDES-DOCUSATE SODIUM 8.6-50 MG PO TABS: 1.0000 | ORAL_TABLET | Freq: Two times a day (BID) | ORAL | 0 refills | Status: DC

## 2016-04-03 MED ORDER — GLIPIZIDE 5 MG PO TABS: 5.0000 mg | ORAL_TABLET | Freq: Two times a day (BID) | ORAL | 0 refills | Status: DC

## 2016-04-03 MED ORDER — NEBIVOLOL HCL 10 MG PO TABS: 10.0000 mg | ORAL_TABLET | Freq: Every day | ORAL | 0 refills | Status: DC

## 2016-04-03 MED ORDER — AMLODIPINE BESYLATE 10 MG PO TABS: 10.0000 mg | ORAL_TABLET | Freq: Every day | ORAL | 0 refills | Status: DC

## 2016-04-03 MED ORDER — METFORMIN HCL 1000 MG PO TABS: 1000.0000 mg | ORAL_TABLET | Freq: Two times a day (BID) | ORAL | 0 refills | Status: AC

## 2016-04-03 MED ORDER — CLONIDINE HCL 0.2 MG PO TABS: 0.2000 mg | ORAL_TABLET | Freq: Three times a day (TID) | ORAL | 0 refills | Status: DC

## 2016-04-03 NOTE — Discharge Summary (Signed)
Physician Discharge Summary  Patient ID: Richard Alvarado MRN: [id number] DOB/AGE: 09/02/1940 75 y.o.  Admit date: 03/24/2016 Discharge date: 04/03/2016  Discharge Diagnoses:  Principal Problem:   Nontraumatic acute hemorrhage of basal ganglia (HCC) Active Problems:   Ataxia   Essential hypertension   DM (diabetes mellitus), type 2 with neurological complications ([place])   Discharged Condition: stable.    Labs:  Basic Metabolic Panel: BMP Latest Ref Rng & Units 03/25/2016 03/24/2016 03/23/2016  Glucose 65 - 99 mg/dL 119(H) 126(H) 124(H)  BUN 6 - 20 mg/dL 11 12 10   Creatinine 0.61 - 1.24 mg/dL 0.99 1.26(H) 0.92  Sodium 135 - 145 mmol/L 138 137 138  Potassium 3.5 - 5.1 mmol/L 3.7 3.5 3.4(L)  Chloride 101 - 111 mmol/L 108 105 109  CO2 22 - 32 mmol/L 21(L) 24 22  Calcium 8.9 - 10.3 mg/dL 9.2 9.2 9.0    CBC: CBC Latest Ref Rng & Units 03/25/2016 03/24/2016 03/21/2016  WBC 4.0 - 10.5 K/uL 5.7 5.1 5.2  Hemoglobin 13.0 - 17.0 g/dL 13.4 12.9(L) 15.0  Hematocrit 39.0 - 52.0 % 41.3 41.0 47.0  Platelets 150 - 400 K/uL 216 235 260    CBG:  Recent Labs Lab 04/02/16 0629 04/02/16 1159 04/02/16 1645 04/02/16 2023 04/03/16 0624  GLUCAP 109* 98 97 87 94    Brief HPI:   Richard Alvarado is a 75 year old male with history of HTN, OSA,  PPM, prior CVA 2014;  who was admitted on 03/21/16 with development of BLE weakness and difficulty standing/walking while at football game. UDS negative.  He was taken to [facility] where CT head done revealing small acute R-BG hemorrhage and elevated BP. He was started on cardene drip and transferred to [facility] for treatment/evalution. CTA head/neck done revealing stable acute punctate right posterior BG hemorrhage, stable advanced microvascular disease, mild 20% stenosis proximal ICA, likely moderate stenosis L-VA and intracranial atherosclerosis.  Patient with resultant mild left facial weakness, left inattention,  left pronator drift, decrease in [facility] LLL and  mild left LE weakness. PT evaluation done and CIR recommended for follow up therapy.    Hospital Course: Richard Alvarado was admitted to rehab 03/24/2016 for inpatient therapies to consist of PT and OT at least three hours five days a week. Past admission physiatrist, therapy team and rehab RN have worked together to provide customized collaborative inpatient rehab. His blood pressures continued to be labile with elevation at nights. Clonidine was titrated upwards and losartan was changed to 100 mg bid  with steady improvement in BP. Cardiology was consulted per family request and recommended monitoring for efficacy with recent changes . Po intake has been good and he is continent of bowel and bladder. Diabetes has been monitored with ac/hs checks and he was noted to have hypoglycemic episodes therefore glipizide was decreased to 5 mg bid. His left inattention has resolved and he is showing improvement in [facility] of LUE and LLE. He has made good progress during his rehab stay and is modified independent at discharge. He will continue to receive follow up outpatient PT and OT at [facility] after discharge.    Rehab course: During patient's stay in rehab weekly team conferences were held to monitor patient's progress, set goals and discuss barriers to discharge. At admission, patient required min assist with mobility and basic self care tasks. He has had improvement in activity tolerance, balance, postural control, as well as ability to compensate for deficits. He is has had improvement in functional  use LUE  and LLE as well as improved awareness. He is able to complete ADL tasks at modified independent level and requires supervision with tub/shower transfers. He is modified independent for transfers and to ambulate 500' with straight gain. He requires supervision in community setting.  Family education was completed regarding all aspects of care and safety.    Disposition: 01-Home or Self  Care  Diet: Heart Healthy/Diabetic   Special Instructions: 1. Check blood sugars twice a day. [facility] to contact patient with date/time of therapy appt. 3. No driving or strenuous activity till cleared by MD.    Discharge Instructions    Ambulatory referral to Physical Medicine Rehab    Complete by:  As directed    1-2 week follow up       Medication List    STOP taking these medications   aspirin EC 81 MG tablet   glipiZIDE-metformin 5-500 MG tablet Commonly known as:  METAGLIP   KLOR-CON 10 10 MEQ tablet Generic drug:  potassium chloride   niacin 500 MG CR tablet Commonly known as:  NIASPAN     TAKE these medications   amLODipine 10 MG tablet Commonly known as:  NORVASC Take 1 tablet (10 mg total) by mouth daily. Start taking on:  04/04/2016 What changed:  medication strength  how much to take   atorvastatin 80 MG tablet Commonly known as:  LIPITOR Take 80 mg by mouth at bedtime.   cloNIDine 0.2 MG tablet Commonly known as:  CATAPRES Take 1 tablet (0.2 mg total) by mouth 3 (three) times daily. What changed:  when to take this   furosemide 40 MG tablet Commonly known as:  LASIX Take 1 tablet (40 mg total) by mouth daily.   glipiZIDE 5 MG tablet Commonly known as:  GLUCOTROL Take 1 tablet (5 mg total) by mouth 2 (two) times daily before a meal.   losartan 100 MG tablet Commonly known as:  COZAAR Take 1 tablet (100 mg total) by mouth 2 (two) times daily. What changed:  how much to take  when to take this   metFORMIN 1000 MG tablet Commonly known as:  GLUCOPHAGE Take 1 tablet (1,000 mg total) by mouth 2 (two) times daily with a meal.   nebivolol 10 MG tablet Commonly known as:  BYSTOLIC Take 1 tablet (10 mg total) by mouth daily.   pantoprazole 40 MG tablet Commonly known as:  PROTONIX Take 1 tablet (40 mg total) by mouth daily. Start taking on:  04/04/2016   senna-docusate 8.6-50 MG tablet Commonly known as:   Senokot-S Take 1 tablet by mouth 2 (two) times daily.      Follow-up Information    Charlett Blake, MD .   Specialty:  Physical Medicine and Rehabilitation Why:  office will call you with follow up appointment Contact information: [place] [address] [phone]        [name], MD. Call today.   Specialty:  Cardiology Why:  for follow up appointment Contact information: [address] [place] [address] [phone]        Xu,Jindong, MD. Call in 1 day(s).   Specialty:  Neurology Why:  for follow up appointment in 4-6 weeks Contact information: [address] [place] [address] [phone]        Alonza Bogus, MD Follow up on 04/09/2016.   Specialty:  Pulmonary Disease Why:  Appointment @ 2:00 PM Contact information: [place]  [place] [address] [phone]           Signed: Bary Leriche 04/03/2016, 3:58 PM

## 2016-04-03 NOTE — Progress Notes (Signed)
Pt discharged to home with wife. Pt given discharge instructions and all belongings are with patient.

## 2016-04-03 NOTE — Progress Notes (Signed)
Social Work  Discharge Note  The overall goal for the admission was met for:   Discharge location: Yes-HOME WITH WIFE WHO CAN PROVIDE SUPERVISION LEVEL  Length of Stay: Yes-10 DAYS  Discharge activity level: Yes-MOD/I-SUPERVISION LEVEL  Home/community participation: Yes  Services provided included: MD, RD, PT, OT, SLP, RN, CM, TR, Pharmacy and SW  Financial Services: Medicare  Follow-up services arranged: Outpatient: [facility]-PT & OT-WILL CALL AT HOME TO SCHEDULE APPOINTMENT, DME: [facility] and Patient/Family has no preference for HH/DME agencies  Comments (or additional information):WIFE IS A RETIRED RN AND WAS HERE FOR TEACHING. PT DID WELL AND MADE GOOD PROGRESS. OP WILL CONTACT HE OR HIS WIFE TO SCHEDULE APPOINTMENTS.  Patient/Family verbalized understanding of follow-up arrangements: Yes  Individual responsible for coordination of the follow-up plan: SELF & LENA-WIFE  Confirmed correct DME delivered: Richard Alvarado, Richard Alvarado 04/03/2016    Richard Alvarado, Richard Alvarado 

## 2016-04-03 NOTE — Discharge Instructions (Signed)
Inpatient Rehab Discharge Instructions  Richard Alvarado Discharge date and time: 04/03/16   Activities/Precautions/ Functional Status: Activity: no lifting, driving, or strenuous exercise for till cleared by MD.  Diet: cardiac diet and diabetic diet Wound Care: none needed    Functional status:  ___ No restrictions     ___ Walk up steps independently ___ 24/7 supervision/assistance   ___ Walk up steps with assistance _X__ Intermittent supervision/assistance  ___ Bathe/dress independently ___ Walk with walker     _X__ Bathe/dress with supervision.  ___ Walk Independently    ___ Shower independently ___ Walk with assistance    ___ Shower with assistance _X__ No alcohol     ___ Return to work/school ________  Special Instructions:    COMMUNITY REFERRALS UPON DISCHARGE:    Outpatient: PT & OT  [place]   [id number]   Date of Last Service:04/03/2016  Appointment Date/Time:WILL CONTACT YOU TO [facility] Equipment/Items Ordered:CANE  Agency/Supplier:[facility]   [phone]   GENERAL COMMUNITY RESOURCES FOR PATIENT/FAMILY: Support Groups:[facility] SUPPORT GROUP EVERY SECOND Thursday @ 3:00-4:00 PM ON THE REHAB UNIT QUESTIONS CONTACT KATIE  [id number]  STROKE/TIA DISCHARGE INSTRUCTIONS SMOKING Cigarette smoking nearly doubles your risk of having a stroke & is the single most alterable risk factor  If you smoke or have smoked in the last 12 months, you are advised to quit smoking for your health.  Most of the excess cardiovascular risk related to smoking disappears within a year of stopping.  Ask you doctor about anti-smoking medications  [place] Quit Line: 1-800-QUIT NOW  Free Smoking Cessation Classes [phone]  CHOLESTEROL Know your levels; limit fat & cholesterol in your diet  Lipid Panel     Component Value Date/Time   CHOL 111 03/22/2016 0528   TRIG 43 03/22/2016 0528   HDL 34 (L) 03/22/2016 0528   CHOLHDL 3.3 03/22/2016 0528   VLDL 9 03/22/2016 0528   LDLCALC 68 03/22/2016 0528      Many patients benefit from treatment even if their cholesterol is at goal.  Goal: Total Cholesterol (CHOL) less than 160  Goal:  Triglycerides (TRIG) less than 150  Goal:  HDL greater than 40  Goal:  LDL (LDLCALC) less than 100   BLOOD PRESSURE American Stroke Association blood pressure target is less that 120/80 mm/Hg  Your discharge blood pressure is:  BP: (!) 151/66  Monitor your blood pressure  Limit your salt and alcohol intake  Many individuals will require more than one medication for high blood pressure  DIABETES (A1c is a blood sugar average for last 3 months) Goal HGBA1c is under 7% (HBGA1c is blood sugar average for last 3 months)  Diabetes:     Lab Results  Component Value Date   HGBA1C 6.9 (H) 03/22/2016     Your HGBA1c can be lowered with medications, healthy diet, and exercise.  Check your blood sugar as directed by your physician  Call your physician if you experience unexplained or low blood sugars.  PHYSICAL ACTIVITY/REHABILITATION Goal is 30 minutes at least 4 days per week  Activity: No driving, Therapies: See above Return to work: N/A  Activity decreases your risk of heart attack and stroke and makes your heart stronger.  It helps control your weight and blood pressure; helps you relax and can improve your mood.  Participate in a regular exercise program.  Talk with your doctor about the best form of exercise for you (dancing, walking, swimming, cycling).  DIET/WEIGHT Goal is  to maintain a healthy weight  Your discharge diet is: Diet heart healthy/carb modified Room service appropriate? Yes; Fluid consistency: Thin  liquids Your height is:  Height: 5\' 10"  (177.8 cm) Your current weight is: Weight: 88.2 kg (194 lb 7.1 oz) Your Body Mass Index (BMI) is:  BMI (Calculated): 29  Following the type of diet specifically designed for you will help prevent another  stroke.  Your goal weight is:  174 lbs  Your goal Body Mass Index (BMI) is 19-24.  Healthy food habits can help reduce 3 risk factors for stroke:  High cholesterol, hypertension, and excess weight.  RESOURCES Stroke/Support Group:  Call [phone]   STROKE EDUCATION PROVIDED/REVIEWED AND GIVEN TO PATIENT Stroke warning signs and symptoms How to activate emergency medical system (call 911). Medications prescribed at discharge. Need for follow-up after discharge. Personal risk factors for stroke. Pneumonia vaccine given:  Flu vaccine given:  My questions have been answered, the writing is legible, and I understand these instructions.  I will adhere to these goals & educational materials that have been provided to me after my discharge from the hospital.      My questions have been answered and I understand these instructions. I will adhere to these goals and the provided educational materials after my discharge from the hospital.  Patient/Caregiver Signature _______________________________ Date __________  Clinician Signature _______________________________________ Date __________  Please bring this form and your medication list with you to all your follow-up doctor's appointments.

## 2016-04-03 NOTE — Progress Notes (Signed)
Subjective/Complaints: No issues overnite   ROS:  Denies CP, SOB, N/V/D  Objective: Vital Signs: Blood pressure (!) 151/66, pulse 62, temperature 98.4 F (36.9 C), temperature source Oral, resp. rate 16, height 5\' 10"  (1.778 m), weight 88.2 kg (194 lb 7.1 oz), SpO2 99 %. No results found. Results for orders placed or performed during the hospital encounter of 03/24/16 (from the past 72 hour(s))  Glucose, capillary     Status: Abnormal   Collection Time: 03/31/16 12:10 PM  Result Value Ref Range   Glucose-Capillary 129 (H) 65 - 99 mg/dL   Comment 1 Notify RN   Glucose, capillary     Status: None   Collection Time: 03/31/16  4:43 PM  Result Value Ref Range   Glucose-Capillary 99 65 - 99 mg/dL   Comment 1 Notify RN   Glucose, capillary     Status: Abnormal   Collection Time: 03/31/16  9:00 PM  Result Value Ref Range   Glucose-Capillary 106 (H) 65 - 99 mg/dL  Glucose, capillary     Status: Abnormal   Collection Time: 04/01/16  6:36 AM  Result Value Ref Range   Glucose-Capillary 107 (H) 65 - 99 mg/dL  Glucose, capillary     Status: None   Collection Time: 04/01/16 11:35 AM  Result Value Ref Range   Glucose-Capillary 98 65 - 99 mg/dL  Glucose, capillary     Status: Abnormal   Collection Time: 04/01/16  5:10 PM  Result Value Ref Range   Glucose-Capillary 102 (H) 65 - 99 mg/dL   Comment 1 Notify RN   Glucose, capillary     Status: None   Collection Time: 04/01/16  8:04 PM  Result Value Ref Range   Glucose-Capillary 92 65 - 99 mg/dL  Glucose, capillary     Status: Abnormal   Collection Time: 04/02/16  6:29 AM  Result Value Ref Range   Glucose-Capillary 109 (H) 65 - 99 mg/dL  Glucose, capillary     Status: None   Collection Time: 04/02/16 11:59 AM  Result Value Ref Range   Glucose-Capillary 98 65 - 99 mg/dL   Comment 1 Notify RN   Glucose, capillary     Status: None   Collection Time: 04/02/16  4:45 PM  Result Value Ref Range   Glucose-Capillary 97 65 - 99 mg/dL    Comment 1 Notify RN   Glucose, capillary     Status: None   Collection Time: 04/02/16  8:23 PM  Result Value Ref Range   Glucose-Capillary 87 65 - 99 mg/dL   Comment 1 Notify RN   Glucose, capillary     Status: None   Collection Time: 04/03/16  6:24 AM  Result Value Ref Range   Glucose-Capillary 94 65 - 99 mg/dL   Comment 1 Notify RN      HEENT: normocephalic, atraumatic.  Cardio: RRR and no murmur, -JVD Resp: B/l CTA GI: BS positive and NT, ND Skin:   Intact. Warm and dry. Neuro: Alert/Oriented,  Motor: 4-4+/5 Left Delt bi, tri, grip, HF, KE, ADF  Musc/Skel:  No edema. No tenderness. Gen NAD. Vital signs reviewed.    Assessment/Plan: 1. Functional deficits secondary to Right basal ganglia ICH Stable for D/C today F/u PCP in 3-4 weeks F/u PM&R 2 weeks See D/C summary See D/C instructionsFIM: Function - Bathing Bathing activity did not occur: Refused Position:  (did not complete this date due to shower yesterday) Body parts bathed by patient: Right arm, Left arm, Chest, Abdomen, Front  perineal area, Buttocks, Right upper leg, Left upper leg, Right lower leg, Left lower leg Body parts bathed by helper: Back Assist Level: More than reasonable time  Function- Upper Body Dressing/Undressing What is the patient wearing?: Pull over shirt/dress, Button up shirt Pull over shirt/dress - Perfomed by patient: Thread/unthread right sleeve, Thread/unthread left sleeve, Put head through opening, Pull shirt over trunk Button up shirt - Perfomed by patient: Thread/unthread right sleeve, Thread/unthread left sleeve, Pull shirt around back, Button/unbutton shirt Assist Level: More than reasonable time Set up : To obtain clothing/put away Function - Lower Body Dressing/Undressing What is the patient wearing?: Underwear, Pants, Socks, Shoes Position:  (recliner) Underwear - Performed by patient: Thread/unthread right underwear leg, Thread/unthread left underwear leg, Pull underwear  up/down Underwear - Performed by helper: Thread/unthread left underwear leg Pants- Performed by patient: Thread/unthread right pants leg, Thread/unthread left pants leg, Pull pants up/down, Fasten/unfasten pants Pants- Performed by helper: Thread/unthread left pants leg Socks - Performed by patient: Don/doff right sock, Don/doff left sock Shoes - Performed by patient: Don/doff right shoe, Fasten right, Fasten left, Don/doff left shoe Shoes - Performed by helper: Don/doff left shoe Assist for footwear: Independent Assist for lower body dressing: More than reasonable time Set up : To obtain clothing/put away (don ankle support)  Function - Toileting Toileting steps completed by patient: Adjust clothing prior to toileting, Performs perineal hygiene, Adjust clothing after toileting Toileting steps completed by helper: Performs perineal hygiene, Adjust clothing after toileting (per [employer], NT assist past bm) Toileting Assistive Devices: Grab bar or rail Assist level: Supervision or verbal cues  Function - Air cabin crew transfer assistive device: Grab bar Assist level to toilet: No Help, no cues, assistive device, takes more than a reasonable amount of time Assist level from toilet: No Help, no cues, assistive device, takes more than a reasonable amount of time Assist level to bedside commode (at bedside): Maximal assist (Pt 25 - 49%/lift and lower) (per [employer], NT) Assist level from bedside commode (at bedside): Maximal assist (Pt 25 - 49%/lift and lower) (per [employer], NT)  Function - Chair/bed transfer Chair/bed transfer method: Ambulatory Chair/bed transfer assist level: No Help, no cues, assistive device, takes more than a reasonable amount of time Chair/bed transfer assistive device: Walker  Function - Locomotion: Wheelchair Will patient use wheelchair at discharge?: No Type: Manual Wheelchair activity did not occur: N/A Max wheelchair distance:  160 Assist Level: Supervision or verbal cues Wheel 50 feet with 2 turns activity did not occur: N/A Assist Level: Supervision or verbal cues Wheel 150 feet activity did not occur: N/A Assist Level: Supervision or verbal cues Turns around,maneuvers to table,bed, and toilet,negotiates 3% grade,maneuvers on rugs and over doorsills: No Function - Locomotion: Ambulation Assistive device: Cane-straight Max distance: 500 Assist level: No help, No cues, assistive device, takes more than a reasonable amount of time (supervision in community environment) Assist level: No help, No cues, assistive device, takes more than a reasonable amount of time Assist level: No help, No cues, assistive device, takes more than a reasonable amount of time Walk 150 feet activity did not occur: Safety/medical concerns (endurance') Assist level: No help, No cues, assistive device, takes more than a reasonable amount of time Assist level: Supervision or verbal cues  Function - Comprehension Comprehension: Auditory Comprehension assist level: Follows complex conversation/direction with no assist  Function - Expression Expression: Verbal Expression assist level: Expresses complex ideas: With no assist  Function - Social Interaction Social Interaction assist level:  Interacts appropriately with others with medication or extra time (anti-anxiety, antidepressant).  Function - Problem Solving Problem solving assist level: Solves complex problems: Recognizes & self-corrects  Function - Memory Memory assist level: More than reasonable amount of time Patient normally able to recall (first 3 days only): Current season, Location of own room, Staff names and faces, That he or she is in a hospital Medical Problem List and Plan: 1.  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  DVT Prophylaxis/Anticoagulation: Mechanical: Sequential compression devices,  below knee Bilateral lower extremities-started lovenox  3. Pain Management:  N/A 4. Mood: LCSW to follow for evaluation and support.  5. Neuropsych: This patient is capable of making decisions on his own behalf. 6. Skin/Wound Care: Routine pressure relief measures 7. Fluids/Electrolytes/Nutrition: Monitor I/O. BUN/Cr nl 8.HTN: monitor BP tid. Continue Norvasc 10mg , catapress .2mg  TID, lasix, bystolic 10mg  and losartan 100mg  BID. Added prn clonidine for sys BP>160, Cardiology now signed off  Vitals:   04/02/16 1429 04/03/16 0514  BP: (!) 160/74 (!) 151/66  Pulse: 63 62  Resp: 18 16  Temp: 98.2 F (36.8 C) 98.4 F (36.9 C)   9.T2DM: Monitor BS ac/hs. Appetite has been good.   Continue metformin   Glipizide decreased to 5mg   BID on 10/22, well controlled at present CBG (last 3)   Recent Labs  04/02/16 1645 04/02/16 2023 04/03/16 0624  GLUCAP 97 87 94   10 Dyslipidemia: Resume at discharge.   LOS (Days) 10 A FACE TO FACE EVALUATION WAS PERFORMED  Richard Alvarado E 04/03/2016, 7:09 AM

## 2016-04-04 ENCOUNTER — Telehealth: Payer: Self-pay

## 2016-04-04 ENCOUNTER — Telehealth: Payer: Self-pay | Admitting: *Deleted

## 2016-04-04 NOTE — Telephone Encounter (Signed)
Made 1st attempt for a transitional care call, home phone number

## 2016-04-04 NOTE — Telephone Encounter (Signed)
  Transitional Care call-Richard Alvarado  1. Are you/is patient experiencing any problems since coming home? No Are there any questions regarding any aspect of care? No 2. Are there any questions regarding medications administration/dosing? No Are meds being taken as prescribed? Yes  3. Have there been any falls? No 4. Has Home Health been to the house and/or have they contacted you? Wife stated  "Patient isn't going to need home health" 5. Are bowels and bladder emptying properly? Yes Are there any unexpected incontinence issues? No 6. Any fevers, problems with breathing, unexpected pain? No 7. Are there any skin problems or new areas of breakdown? no 8. Has the patient/family member arranged specialty MD follow up (ie cardiology/neurology/renal/surgical/etc)? Yes 9. Does the patient need any other services or support that we can help arrange? No wife stated  "he has in house therapy" 10. Are caregivers following through as expected in assisting the patient? yes 11. Has the patient quit smoking, drinking alcohol, or using drugs as recommended? Patient doesn't use tobacco products, illegal drugs or alcohol.   Appointment Confirmed

## 2016-04-04 NOTE — Telephone Encounter (Signed)
Made 2nd attempt for transitional Care Call, mobile phone number

## 2016-04-07 NOTE — Telephone Encounter (Signed)
Transitional care call completed by Elinor Parkinson.

## 2016-04-08 ENCOUNTER — Ambulatory Visit (HOSPITAL_COMMUNITY): Payer: Medicare Other | Admitting: Physical Therapy

## 2016-04-08 ENCOUNTER — Encounter (HOSPITAL_COMMUNITY): Payer: Self-pay | Admitting: Occupational Therapy

## 2016-04-08 ENCOUNTER — Ambulatory Visit (HOSPITAL_COMMUNITY): Payer: Medicare Other | Attending: Pulmonary Disease | Admitting: Occupational Therapy

## 2016-04-08 DIAGNOSIS — R2681 Unsteadiness on feet: Secondary | ICD-10-CM | POA: Diagnosis not present

## 2016-04-08 DIAGNOSIS — R29898 Other symptoms and signs involving the musculoskeletal system: Secondary | ICD-10-CM | POA: Diagnosis not present

## 2016-04-08 DIAGNOSIS — R262 Difficulty in walking, not elsewhere classified: Secondary | ICD-10-CM | POA: Insufficient documentation

## 2016-04-08 DIAGNOSIS — M6281 Muscle weakness (generalized): Secondary | ICD-10-CM | POA: Diagnosis not present

## 2016-04-08 NOTE — Therapy (Signed)
[facility] [address] [place], [place], [address] Phone: [phone]   Fax:  [phone]  Occupational Therapy Evaluation  Patient Details  Name: [name] MRN: [id number] Date of Birth: [birth year] Referring Provider: Dr. Alysia Penna  Encounter Date: 04/08/2016      OT End of Session - 04/08/16 1505    Visit Number 1   Number of Visits 1   Date for OT Re-Evaluation 04/09/16   Authorization Type Medicare A & B   Authorization Time Period Before 10th visit.    Authorization - Visit Number 1   Authorization - Number of Visits 10   OT Start Time [id number]   OT Stop Time 1457   OT Time Calculation (min) 25 min   Activity Tolerance Patient tolerated treatment well   Behavior During Therapy WFL for tasks assessed/performed      Past Medical History:  Diagnosis Date  . Cerebral atherosclerosis    CAROTID DOPPLER, 09/07/2009 - RIGHT AND LEFT CCAs-small-moderate amount of irregular mixed density plaque, no significant evidence of diameter reduction; RIGHT AND LEFT ICAs-moderate amount irregular mixed density plaque, no evidence of significant diameter reduction  . CVA (cerebral vascular accident) ([place]) 1994   Left brain  . History of CVA (cerebrovascular accident) 04/24/2013  . Hypertension    RENAL DOPPLER, 03/08/2009 - normal  . Hypertension in pregnancy, preeclampsia, severe 04/24/2013  . Obstructive sleep apnea 09/26/2005   AHI-19.46, during REM-36.88  . Pacemaker St. Jude victory, dual chamber, 2006 04/24/2013  . Pericardial effusion    2D ECHO, 03/07/2011 - EF >70%, normal  . Second degree heart block 04/24/2013    Past Surgical History:  Procedure Laterality Date  . EP IMPLANTABLE DEVICE N/A 11/21/2014   Procedure: PPM/BIV PPM Generator Changeout;  Surgeon: [name], MD;  Location: [facility];  Service: Cardiovascular;  Laterality: N/A;  . NM MYOVIEW LTD  03/31/2012   Normal study, no ECG changes, EKG  negative for ischemia, post-stress EF 54%  . PACEMAKER INSERTION  05/09/2005   St. Jude [place], [place] #[id number], serial [phone]    There were no vitals filed for this visit.      Subjective Assessment - 04/08/16 1503    Subjective  S: I am doing everything for myself now.    Pertinent History Pt is a 75 y/o male s/p right basal ganglia CVA on 03/21/16. Pt received rehab services at [facility] until 04/03/16 when he was discharged home. Pt was referred to occupational therapy for evaluation and treatment.    Patient Stated Goals To get this left leg working again.    Currently in Pain? No/denies           [facility] OT Assessment - 04/08/16 1434      Assessment   Diagnosis Right CVA   Referring Provider Dr. Alysia Penna   Onset Date 03/21/16   Prior Therapy CIR     Precautions   Precautions None     Restrictions   Weight Bearing Restrictions No     Balance Screen   Has the patient fallen in the past 6 months No   Has the patient had a decrease in activity level because of a fear of falling?  No   Is the patient reluctant to leave their home because of a fear of falling?  No     Home  Environment   Family/patient expects to be discharged to: Private residence   Living Arrangements Spouse/significant other  step-son  Available Help at Discharge Family  24/7 supervision   Bathroom Shower/Tub Tub/Shower unit   [place] - single point   Lives With Spouse  step-son     Prior Function   Level of Independence Independent   Vocation Retired   Biomedical scientist retired Economist   Leisure yardwork     ADL   Eating/Feeding Modified independent   Grooming Modified independent   Lower Body Bathing Modified independent   Herbalist Modified independent   [place] independent   [place] Transfer  Modified independent   ADL comments Pt is completing ADL tasks independently, increased time for certain tasks.      Written Expression   Dominant Hand Right     Vision - History   Baseline Vision No visual deficits   Additional Comments wears reading glasses     Cognition   Overall Cognitive Status Within Functional Limits for tasks assessed     Sensation   Light Touch Appears Intact   Stereognosis Appears Intact   Hot/Cold Appears Intact   Proprioception Appears Intact     Coordination   Gross Motor Movements are Fluid and Coordinated Yes   Fine Motor Movements are Fluid and Coordinated Yes   9 Hole Peg Test Right;Left   Right 9 Hole Peg Test 22.48"   Left 9 Hole Peg Test 23.21"     ROM / Strength   AROM / PROM / Strength AROM;Strength     AROM   Overall AROM  Within functional limits for tasks performed     Strength   Overall Strength Within functional limits for tasks performed   Strength Assessment Site Shoulder;Elbow;Forearm;Wrist;Hand   Right/Left Shoulder Left   Left Shoulder Flexion 5/5   Left Shoulder ABduction 5/5   Left Shoulder Internal Rotation 5/5   Left Shoulder External Rotation 5/5   Right/Left Elbow Left   Left Elbow Flexion 5/5   Left Elbow Extension 5/5   Right/Left Forearm Left   Left Forearm Pronation 5/5   Left Forearm Supination 5/5   Right/Left Wrist Left   Left Wrist Flexion 5/5   Left Wrist Extension 5/5   Left Wrist Radial Deviation 5/5   Left Wrist Ulnar Deviation 5/5   Right/Left hand Left;Right   Right Hand Gross Grasp Functional   Right Hand Grip (lbs) 105   Right Hand Lateral Pinch 30 lbs   Right Hand 3 Point Pinch 27 lbs   Left Hand Gross Grasp Functional   Left Hand Grip (lbs) 96   Left Hand Lateral Pinch 26 lbs   Left Hand 3 Point Pinch 23 lbs                         OT Education - 04/08/16 1504    Education provided Yes   Education Details Pt educated on taking his time with ADL tasks versus  rushing through them to improve quality of completion and limit repetitions of tasks.    Person(s) Educated Patient   Methods Explanation   Comprehension Verbalized understanding                    Plan - 04/08/16 1506    Clinical Impression Statement A: Pt is a 75 y/o male s/p right CVA on 10/13, received rehab services at [facility] until 04/03/16. Pt  is functioning at a modified independent level with ADL task completion, strength is WNL for LUE and grip/pinch, coordination is intact. Pt wife is present 24/7 to provide assistance if necessary. Educated pt on taking his time with ADL and functional mobility tasks versus rushing and having to repeat tasks. Pt verbalized understanding, and is in agreeance that he not longer needs OT services.    OT Frequency One time visit   OT Treatment/Interventions Patient/family education   Plan P: Educated pt on taking his time with tasks versus rushing and to continue completing tasks using BUE or LUE as he naturally would.    OT Home Exercise Plan GOAL: plan for time tasks will take versus being rushed.    Consulted and Agree with Plan of Care Patient      Patient will benefit from skilled therapeutic intervention in order to improve the following deficits and impairments:     Visit Diagnosis: Other symptoms and signs involving the musculoskeletal system      G-Codes - 04/28/16 1510    Functional Assessment Tool Used clinical judgement   Functional Limitation Self care   Self Care Goal Status [id number]) At least 1 percent but less than 20 percent impaired, limited or restricted   Self Care Discharge Status [phone]) At least 1 percent but less than 20 percent impaired, limited or restricted      Problem List Patient Active Problem List   Diagnosis Date Noted  . DM (diabetes mellitus), type 2 with neurological complications ([facility])   . Essential hypertension   . Family history of stroke 03/24/2016  . OSA (obstructive sleep apnea) 03/24/2016   . Hypokalemia 03/24/2016  . Nontraumatic acute hemorrhage of basal ganglia ([place]) 03/24/2016  . Ataxia   . ICH (intracerebral hemorrhage) ([facility]) - R basal ganglia 03/22/2016  . Hypertensive emergency   . Diabetes mellitus type 2 in nonobese ([place]) 03/19/2016  . Pacemaker battery depletion 11/14/2014  . Hyperlipidemia 11/01/2013  . Severe hypertension 04/24/2013  . Pericardial effusion secondary to minoxidil 04/24/2013  . History of CVA (cerebrovascular accident) 04/24/2013  . Second degree heart block 04/24/2013  . Pacemaker St. Jude victory, dual chamber, 2006 04/24/2013  . Cervicalgia 01/01/2011  . Muscle weakness (generalized) 01/01/2011   Guadelupe Sabin, OTR/L  [phone] Apr 28, 2016, 3:11 PM  [facility] [address] [place], [place], [address] Phone: [phone]   Fax:  [phone]  Name: Richard Alvarado MRN: [id number] Date of Birth: August 28, 1940

## 2016-04-08 NOTE — Therapy (Signed)
[facility] [address] [place], [place], [address] Phone: [phone]   Fax:  [phone]  Physical Therapy Evaluation  Patient Details  Name: Richard Alvarado MRN: [id number] Date of Birth: January 20, 1941 Referring Provider: Alysia Penna   Encounter Date: 04/08/2016      PT End of Session - 04/08/16 1611    Visit Number 1   Number of Visits 12   Date for PT Re-Evaluation 04/29/16   Authorization Type Medicare    Authorization Time Period 04/08/16 to 05/20/16   Authorization - Visit Number 1   Authorization - Number of Visits 10   PT Start Time [id number]   PT Stop Time 1558   PT Time Calculation (min) 41 min   Equipment Utilized During Treatment Gait belt   Activity Tolerance Patient tolerated treatment well   Behavior During Therapy [facility] for tasks assessed/performed      Past Medical History:  Diagnosis Date  . Cerebral atherosclerosis    CAROTID DOPPLER, 09/07/2009 - RIGHT AND LEFT CCAs-small-moderate amount of irregular mixed density plaque, no significant evidence of diameter reduction; RIGHT AND LEFT ICAs-moderate amount irregular mixed density plaque, no evidence of significant diameter reduction  . CVA (cerebral vascular accident) ([place]) 1994   Left brain  . History of CVA (cerebrovascular accident) 04/24/2013  . Hypertension    RENAL DOPPLER, 03/08/2009 - normal  . Hypertension in pregnancy, preeclampsia, severe 04/24/2013  . Obstructive sleep apnea 09/26/2005   AHI-19.46, during REM-36.88  . Pacemaker St. Jude victory, dual chamber, 2006 04/24/2013  . Pericardial effusion    2D ECHO, 03/07/2011 - EF >70%, normal  . Second degree heart block 04/24/2013    Past Surgical History:  Procedure Laterality Date  . EP IMPLANTABLE DEVICE N/A 11/21/2014   Procedure: PPM/BIV PPM Generator Changeout;  Surgeon: [name], MD;  Location: [facility];  Service: Cardiovascular;  Laterality: N/A;  . NM MYOVIEW LTD  03/31/2012    Normal study, no ECG changes, EKG negative for ischemia, post-stress EF 54%  . PACEMAKER INSERTION  05/09/2005   St. Jude [place], [place] #[id number], serial [phone]    There were no vitals filed for this visit.       Subjective Assessment - 04/08/16 1521    Subjective Patient arrives today stating that he had a stroke on October the 13th; he started off at [facility] and then was transferred to [facility]. He was in the hospital around 13 days total. He did receive PT at the hospital for this condition. He states that his L leg is weak, although it is beginning to come back but it is still numb. If he tries to turn fast he loses his balance but if he rushes he will lose his balance. No falls.    Pertinent History beta-blockers, pacemaker, HTN with history of hypertensive emergency, OSA, DM, ICH of basal ganglia   How long can you stand comfortably? 15 minutes    How long can you walk comfortably? 30-45 minutes    Patient Stated Goals get stronger, improve balance, get back to PLOF    Currently in Pain? No/denies            [facility]: [facility] W PT Assessment - 04/08/16 1524      Assessment   Medical Diagnosis R-ICH    Referring Provider Alysia Penna    Onset Date/Surgical Date --  October 2017   Next MD Visit Dr. Luan Pulling NOvember 1st    Prior Therapy [facility]patient [facility]      Precautions   Precaution Comments falls      Balance Screen   Has the patient fallen in the past 6 months No   Has the patient had a decrease in activity level because of a fear of falling?  Yes   Is the patient reluctant to leave their home because of a fear of falling?  No     Prior Function   Level of Independence Independent   Vocation Retired   [employer] retired Product manager, outdoor hobbies      Strength   Right Hip Flexion 5/5   Right Hip ABduction 4-/5   Left Hip Flexion 4/5   Left Hip ABduction 4-/5   Right Knee Flexion 5/5   Right Knee Extension 5/5   Left  Knee Flexion 4+/5   Left Knee Extension 4+/5   Right Ankle Dorsiflexion 5/5   Left Ankle Dorsiflexion 4+/5     Ambulation/Gait   Gait Comments knee hyperextension L LE, fatigue appears to increase with extended time, reduced gait speed     6 minute walk test results    Aerobic Endurance Distance Walked 565   Endurance additional comments 3MWT, SPC      Berg Balance Test   Sit to Stand Able to stand without using hands and stabilize independently   Standing Unsupported Able to stand safely 2 minutes   Sitting with Back Unsupported but Feet Supported on Floor or Stool Able to sit safely and securely 2 minutes   Stand to Sit Sits safely with minimal use of hands   Transfers Able to transfer safely, minor use of hands   Standing Unsupported with Eyes Closed Able to stand 10 seconds with supervision   Standing Ubsupported with Feet Together Able to place feet together independently and stand for 1 minute with supervision   From Standing, Reach Forward with Outstretched Arm Can reach forward >12 cm safely (5")   From Standing Position, Pick up Object from Floor Able to pick up shoe safely and easily   From Standing Position, Turn to Look Behind Over each Shoulder Turn sideways only but maintains balance   Turn 360 Degrees Able to turn 360 degrees safely but slowly   Standing Unsupported, Alternately Place Feet on Step/Stool Able to complete 4 steps without aid or supervision   Standing Unsupported, One Foot in Front Able to take small step independently and hold 30 seconds   Standing on One Leg Able to lift leg independently and hold equal to or more than 3 seconds   Total Score 43                           PT Education - 04/08/16 1611    Education provided Yes   Education Details prognosis, HEP, POC    Person(s) Educated Patient   Methods Explanation;Demonstration;Handout   Comprehension Verbalized understanding;Returned demonstration;Need further instruction           PT Short Term Goals - 04/08/16 1614      PT SHORT TERM GOAL #1   Title Patient to be able to verbally state 5/5 safety concerns/precautions in order to show improved situational awareness and reduce fall risk    Time 3   Period Weeks   Status New     PT SHORT TERM GOAL #2   Title Patient to score at least a 47 on BERG balance test in order to show  reduced fall risk and improved safety with mobility    Time 3   Period Weeks   Status New     PT SHORT TERM GOAL #3   Title Patient to be able to ambulate house hold distances with no device, minimal unsteadiness, and good safety awareness in order to show improved general mobiltiy    Time 3   Period Weeks   Status New     PT SHORT TERM GOAL #4   Title Patient to correctly and consistently perform appropriate HEP, to be updated PRN    Time 1   Period Weeks   Status New           PT Long Term Goals - 04/08/16 1615      PT LONG TERM GOAL #1   Title Patient to demonstrate functional strength as being 5/5 in all tested groups in order to improve stability with gait/mobiltiy and improve functional task performance    Time 6   Period Weeks   Status New     PT LONG TERM GOAL #2   Title Patient to score at least a 52 on BERG balance test in order to show improved general mobility and reduced fall risk    Time 6   Period Weeks   Status New     PT LONG TERM GOAL #3   Title Patient to be able to ambulate unlimited distances with no assistive device, minimal unsteadiness, and no increased fall risk in order to improve general mobiltiy and facilitate return to PLOF    Time 6   Period Weeks   Status New     PT LONG TERM GOAL #4   Title Patient to be able to ambulate outside over grass/gravel/etc with no assistive device and minimal unsteadiness/fall risk in order to facilitate return to PLOF    Time 6   Period Weeks   Status New     PT LONG TERM GOAL #5   Title Patient to be participatory in appropriate regular  aerobic exercise program, at least 30 minutes in duration and at least 4 days per week, in order to maintain functional gains and improve general health status    Time 6   Period Weeks   Status New               Plan - 04/08/16 1612    Clinical Impression Statement Patient arrives today after sustaining a right sided intra-cranial hemorrhage earlier this month; he received skilled PT services in the inpatient setting however has not received home health, and reports he really enjoys working hard with PT and wants to do whatever he needs to get better. Upon examination, patient reveals functional gait impairment, functional muscle weakness, and significant unsteadiness as evidenced by scoring on the BERG- patient does currently remain a significant fall risk. Encouraged patient to continue using [facility] for safety for now, and educated regarding typical course of recovery following neuro-trauma. Patient appears highly motivated to participate in and will benefit from skilled PT services in order to address functional impairments, attempt to reach optimal level of function, and to reduce fall risk.    Rehab Potential Excellent   Clinical Impairments Affecting Rehab Potential highly motivated to participate in skilled PT services    PT Frequency 2x / week   PT Duration 6 weeks   PT Treatment/Interventions ADLs/Self Care Home Management;Biofeedback;Cryotherapy;Moist Heat;DME Instruction;Gait training;Stair training;Functional mobility training;Therapeutic activities;Therapeutic exercise;Balance training;Neuromuscular re-education;Patient/family education;Manual techniques;Energy conservation;Taping   PT Next Visit Plan review HEP  and goals; focus on functional strength and balance, gait with no device, safety awareness    PT Home Exercise Plan 04/08/16: sidelying clams with green TB, sit to stand with equal weight bearing, tandem stance at counter    Consulted and Agree with Plan of Care Patient       Patient will benefit from skilled therapeutic intervention in order to improve the following deficits and impairments:  Abnormal gait, Improper body mechanics, Decreased coordination, Decreased mobility, Postural dysfunction, Decreased strength, Decreased balance, Difficulty walking, Decreased safety awareness  Visit Diagnosis: Unsteadiness on feet - Plan: PT plan of care cert/re-cert  Difficulty in walking, not elsewhere classified - Plan: PT plan of care cert/re-cert  Muscle weakness (generalized) - Plan: PT plan of care cert/re-cert      G-Codes - [id number]    Functional Assessment Tool Used Based on skilled clinical assessment of balance, strength, gait    Functional Limitation Mobility: Walking and moving around   Mobility: Walking and Moving Around Current Status [id number]) At least 40 percent but less than 60 percent impaired, limited or restricted   Mobility: Walking and Moving Around Goal Status [id number]) At least 20 percent but less than 40 percent impaired, limited or restricted       Problem List Patient Active Problem List   Diagnosis Date Noted  . DM (diabetes mellitus), type 2 with neurological complications ([place])   . Essential hypertension   . Family history of stroke 03/24/2016  . OSA (obstructive sleep apnea) 03/24/2016  . Hypokalemia 03/24/2016  . Nontraumatic acute hemorrhage of basal ganglia ([place]) 03/24/2016  . Ataxia   . ICH (intracerebral hemorrhage) ([place]) - R basal ganglia 03/22/2016  . Hypertensive emergency   . Diabetes mellitus type 2 in nonobese ([place]) 03/19/2016  . Pacemaker battery depletion 11/14/2014  . Hyperlipidemia 11/01/2013  . Severe hypertension 04/24/2013  . Pericardial effusion secondary to minoxidil 04/24/2013  . History of CVA (cerebrovascular accident) 04/24/2013  . Second degree heart block 04/24/2013  . Pacemaker St. Jude victory, dual chamber, 2006 04/24/2013  . Cervicalgia 01/01/2011  . Muscle weakness (generalized)  01/01/2011    Deniece Ree PT, DPT [facility] [address] [place], [place], [address] Phone: [phone]   Fax:  [phone]  Name: Richard Alvarado MRN: [id number] Date of Birth: 06/11/1940

## 2016-04-08 NOTE — Patient Instructions (Signed)
   ELASTIC BAND - SIDELYING CLAM-   While lying on your side with your knees bent and an elastic band wrapped around your knees, draw up the top knee while keeping contact of your feet together as shown.   Do not let your pelvis roll back during the lifting movement.    Repeat 10-15 times each side, 2-3 times per day.     SIT TO STAND - NO SUPPORT  Start by scooting close to the front of the chair.  Next, lean forward at your trunk and reach forward with your arms and rise to standing without using your hands to push off from the chair or other object.   Use your arms as a counter-balance by reaching forward when in sitting and lower them as you approach standing.  Make sure you are bearing weight equally through your left and right legs, rather than shifting to the right when you stand up.  Repeat 5-10 times, 2-3 times per day.    TANDEM STANCE WITH SUPPORT  Stand in front of a chair, table or counter top for support. Then place the heel of one foot so that it is touching the toes of the other foot. Maintain your balance in this position.   Hold for 15 seconds, then switch sides. Repeat 3 times each side, 2-3 times per day.

## 2016-04-09 DIAGNOSIS — E119 Type 2 diabetes mellitus without complications: Secondary | ICD-10-CM | POA: Diagnosis not present

## 2016-04-09 DIAGNOSIS — I2510 Atherosclerotic heart disease of native coronary artery without angina pectoris: Secondary | ICD-10-CM | POA: Diagnosis not present

## 2016-04-09 DIAGNOSIS — I10 Essential (primary) hypertension: Secondary | ICD-10-CM | POA: Diagnosis not present

## 2016-04-09 DIAGNOSIS — I69354 Hemiplegia and hemiparesis following cerebral infarction affecting left non-dominant side: Secondary | ICD-10-CM | POA: Diagnosis not present

## 2016-04-10 ENCOUNTER — Ambulatory Visit (HOSPITAL_COMMUNITY): Payer: Medicare Other | Attending: Pulmonary Disease

## 2016-04-10 ENCOUNTER — Encounter: Payer: Self-pay | Admitting: Physician Assistant

## 2016-04-10 ENCOUNTER — Telehealth (HOSPITAL_COMMUNITY): Payer: Self-pay

## 2016-04-10 ENCOUNTER — Ambulatory Visit (INDEPENDENT_AMBULATORY_CARE_PROVIDER_SITE_OTHER): Payer: Medicare Other | Admitting: Physician Assistant

## 2016-04-10 VITALS — BP 169/86 | Ht 70.0 in | Wt 194.6 lb

## 2016-04-10 DIAGNOSIS — I10 Essential (primary) hypertension: Secondary | ICD-10-CM

## 2016-04-10 DIAGNOSIS — R270 Ataxia, unspecified: Secondary | ICD-10-CM | POA: Insufficient documentation

## 2016-04-10 DIAGNOSIS — R2681 Unsteadiness on feet: Secondary | ICD-10-CM | POA: Insufficient documentation

## 2016-04-10 DIAGNOSIS — R0989 Other specified symptoms and signs involving the circulatory and respiratory systems: Secondary | ICD-10-CM | POA: Diagnosis not present

## 2016-04-10 DIAGNOSIS — R262 Difficulty in walking, not elsewhere classified: Secondary | ICD-10-CM | POA: Insufficient documentation

## 2016-04-10 DIAGNOSIS — R29898 Other symptoms and signs involving the musculoskeletal system: Secondary | ICD-10-CM | POA: Insufficient documentation

## 2016-04-10 DIAGNOSIS — I619 Nontraumatic intracerebral hemorrhage, unspecified: Secondary | ICD-10-CM | POA: Insufficient documentation

## 2016-04-10 DIAGNOSIS — M6281 Muscle weakness (generalized): Secondary | ICD-10-CM | POA: Insufficient documentation

## 2016-04-10 MED ORDER — CLONIDINE HCL 0.3 MG PO TABS: 0.3000 mg | ORAL_TABLET | Freq: Two times a day (BID) | ORAL | 6 refills | Status: DC

## 2016-04-10 MED ORDER — AMLODIPINE BESYLATE 10 MG PO TABS: 10.0000 mg | ORAL_TABLET | Freq: Every day | ORAL | 6 refills | Status: DC

## 2016-04-10 NOTE — Progress Notes (Signed)
Cardiology Office Note   Date:  04/10/2016   ID:  Richard Alvarado, DOB [birth year], MRN [id number]  PCP:  Alonza Bogus, MD  Cardiologist:  Dr Juanetta Snow, PA-C   Chief Complaint  Patient presents with  . Hospitalization Follow-up    History of Present Illness: Richard Alvarado is a 75 y.o. male with a history of HTN, DM, HLD, mild carotid dz, St Jude PPM for 2nd degree AVB, OSA, RBBB   D/c 10/26 from inpatient rehab after admit for basal ganglia hemorrhage. Cards saw for BP management  Richard Alvarado presents for f/u after hospital stay and further BP management. His wife is here with him today.   She is giving him his medications and he is compliant with them. His BP has been up and down. SBP 130s when he saw his primary MD recently and was 169 today. However, the lower BP is unusual for him, he is generally high.   He is asymptomatic from a cardiac standpoint. He denies DOE, chest pain, LE edema, orthopnea or PND. He has not had palpitations, presyncope or syncope. He is compliant with PPM checks.  He is working with rehab and says is getting better. He is still using a cane to walk outside the house, but will walk without it inside. Generally feels he is improving. His wife does not report any difficulties with rehab.  His wife is having some problems with him having a very short fuse. 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 However, starting before the stroke, he was getting easily agitated at times and yelling. She wonders if this is running his BP up. She also says he is not sleeping well and wonders if fatigue is causing a higher BP. She has not discussed these issues with his primary MD.    Past Medical History:  Diagnosis Date  . Cerebral atherosclerosis    CAROTID DOPPLER, 09/07/2009 - RIGHT AND LEFT CCAs-small-moderate amount of irregular mixed density plaque, no significant evidence of diameter reduction;  RIGHT AND LEFT ICAs-moderate amount irregular mixed density plaque, no evidence of significant diameter reduction  . CVA (cerebral vascular accident) ([place]) 1994   Left brain  . History of CVA (cerebrovascular accident) 04/24/2013  . Hypertension    RENAL DOPPLER, 03/08/2009 - normal  . Hypertension in pregnancy, preeclampsia, severe 04/24/2013  . Obstructive sleep apnea 09/26/2005   AHI-19.46, during REM-36.88  . Pacemaker St. Jude victory, dual chamber, 2006 04/24/2013  . Pericardial effusion    2D ECHO, 03/07/2011 - EF >70%, normal  . Second degree heart block 04/24/2013    Past Surgical History:  Procedure Laterality Date  . EP IMPLANTABLE DEVICE N/A 11/21/2014   Procedure: PPM/BIV PPM Generator Changeout;  Surgeon: [name], MD;  Location: [facility];  Service: Cardiovascular;  Laterality: N/A;  . NM MYOVIEW LTD  03/31/2012   Normal study, no ECG changes, EKG negative for ischemia, post-stress EF 54%  . PACEMAKER INSERTION  05/09/2005   St. Jude [place], [place] #[id number], serial #[id number]    Current Outpatient Prescriptions  Medication Sig Dispense Refill  . amLODipine (NORVASC) 10 MG tablet Take 1 tablet (10 mg total) by mouth daily. 10 tablet 0  . atorvastatin (LIPITOR) 80 MG tablet Take 80 mg by mouth at bedtime.    . cloNIDine (CATAPRES) 0.2 MG tablet Take 1 tablet (0.2 mg total) by mouth 3 (three) times daily. 90 tablet 0  . furosemide (  LASIX) 40 MG tablet Take 1 tablet (40 mg total) by mouth daily. 30 tablet 0  . glipiZIDE (GLUCOTROL) 5 MG tablet Take 1 tablet (5 mg total) by mouth 2 (two) times daily before a meal. 60 tablet 0  . losartan (COZAAR) 100 MG tablet Take 1 tablet (100 mg total) by mouth 2 (two) times daily. 60 tablet 0  . metFORMIN (GLUCOPHAGE) 1000 MG tablet Take 1 tablet (1,000 mg total) by mouth 2 (two) times daily with a meal. 60 tablet 0  . nebivolol (BYSTOLIC) 10 MG tablet Take 1 tablet (10 mg total) by mouth daily. 30 tablet 0  . pantoprazole  (PROTONIX) 40 MG tablet Take 1 tablet (40 mg total) by mouth daily. 30 tablet 0  . senna (SENOKOT) 8.6 MG tablet Take 1 tablet by mouth daily as needed for constipation.     No current facility-administered medications for this visit.     Allergies:   Review of patient's allergies indicates no known allergies.    Social History:  The patient  reports that he quit smoking about 16 years ago. He has never used smokeless tobacco. He reports that he does not drink alcohol or use drugs.   Family History:  The patient's family history includes CVA in his mother; Heart disease in his maternal grandmother; Hypertension in his sister.    ROS:  Please see the history of present illness. All other systems are reviewed and negative.    PHYSICAL EXAM: VS:  BP (!) 169/86 (BP Location: Right Arm, Patient Position: Sitting)   Ht 5\' 10"  (1.778 m)   Wt 194 lb 9.6 oz (88.3 kg)   BMI 27.92 kg/m  , BMI Body mass index is 27.92 kg/m. GEN: Well nourished, well developed, male in no acute distress  HEENT: normal for age  Neck: JVD 10 cm, no carotid bruit, no masses Cardiac: RRR; no murmur, no rubs, or gallops Respiratory: Decreased BS bases bilaterally, normal work of breathing GI: soft, nontender, nondistended, + BS MS: no deformity or atrophy; no edema; distal pulses are 2+ in all 4 extremities   Skin: warm and dry, no rash Neuro:  Strength and sensation are intact Psych: euthymic mood, full affect   EKG:  EKG is not ordered today.  Echo: 03/23/16 Study Conclusions - Left ventricle: The cavity size was normal. Wall thickness was increased in a pattern of moderate LVH. Systolic function was normal. The estimated ejection fraction was in the range of 60% to 65%. Wall motion was normal; there were no regional wall motion abnormalities. Doppler parameters are consistent with abnormal left ventricular relaxation (grade 1 diastolicdysfunction). - Mitral valve: Mildly calcified leaflets  . - Right ventricle: Pacer wire or catheter noted in right ventricle. - Right atrium: Pacer wire or catheter noted in right atrium. - Atrial septum: No defect or patent foramen ovale was identified.  Ct Angio Head W Or Wo Contrast Result Date: 03/23/2016 CLINICAL DATA:  75 y/o  M; follow-up of intracranial hemorrhage. EXAM: CT ANGIOGRAPHY HEAD AND NECK TECHNIQUE: Multidetector CT imaging of the head and neck was performed using the standard protocol during bolus administration of intravenous contrast. Multiplanar CT image reconstructions and MIPs were obtained to evaluate the vascular anatomy. Carotid stenosis measurements (when applicable) are obtained utilizing NASCET criteria, using the distal internal carotid diameter as the denominator. CONTRAST:  50 cc Isovue 370. COMPARISON:  03/21/2016 CT head. FINDINGS: CT HEAD FINDINGS Brain: Stable acute hemorrhage within the right posterior basal ganglia. Stable punctate density in  left putaminal possibly representing an additional hemorrhage. Stable background of advanced chronic microvascular ischemic changes and mild parenchymal volume loss for age. No evidence for new intracranial hemorrhage, large territory infarct, or focal mass effect. No extra-axial collection. Stable ventricle size. Vascular: As below. Skull: Normal. Negative for fracture or focal lesion. Sinuses: Patchy opacification of ethmoid sinuses and small mucous retention cysts within the alveolar recesses of maxillary sinuses. Otherwise negative. Orbits: Negative Review of the MIP images confirms the above findings CTA NECK FINDINGS Aortic arch: Standard branching. Imaged portion shows no evidence of aneurysm or dissection. No significant stenosis of the major arch vessel origins. Aortic atherosclerosis with mild calcification. Right carotid system: No evidence of dissection, stenosis (50% or greater) or occlusion. Mild mixed plaque of common carotid artery and extensive calcified plaque carotid  bifurcation and proximal internal carotid artery. Mild proximal ICA stenosis, 20%. Left carotid system: No evidence of dissection, stenosis (50% or greater) or occlusion. Mild mixed plaque of common carotid artery and extensive calcified plaque carotid bifurcation and proximal internal carotid artery. Mild proximal ICA stenosis, 20%. Vertebral arteries: Tortuosity and atherosclerosis of left proximal vertebral artery origin with moderate to severe stenosis. Calcified plaque of right vertebral artery origin, accurate assessment of luminal stenosis is precluded by dense calcification. Vertebral arteries are otherwise widely patent. Skeleton: No acute osseous abnormality. Severe cervical spondylosis with discogenic and facet arthropathy. Moderate to severe multilevel canal stenosis greatest at the C3-4 level where there is probable cord impingement. Multilevel moderate to severe bony foraminal narrowing greater on the right from C3 through C5. Other neck: 10 mm right thyroid lobe nodule. No mass or inflammatory process of the neck identified. Upper chest: Clear. Review of the MIP images confirms the above findings CTA HEAD FINDINGS Anterior circulation: No significant stenosis, proximal occlusion, aneurysm, or vascular malformation. Dense calcific atherosclerosis carotid siphons with mild underlying stenosis. Triangular 2 mm anteriorly directed outpouching of carotid terminus likely represents a tiny vessel infundibulum (series 502, image 257). Posterior circulation: No significant stenosis, proximal occlusion, aneurysm, or vascular malformation. Venous sinuses: As permitted by contrast timing, patent. Anatomic variants: Bilateral fetal posterior cerebral arteries. No anterior communicating artery identified, likely hypoplastic or absent. Delayed phase: No abnormal enhancement. Review of the MIP images confirms the above findings IMPRESSION: 1. Stable hemorrhage and right posterior basal ganglia and possible punctate  hemorrhage within the left putamen. 2. No new acute intracranial abnormality identified. 3. Mild 20% stenosis of proximal internal carotid arteries bilaterally. 4. Plaque and tortuosity of proximal left vertebral artery and dense calcified plaque of right vertebral artery origins with probable at least moderate underlying stenosis. 5. No significant stenosis, proximal occlusion, aneurysm, or vascular malformation of the circle Willis. Specifically no abnormal vascularity in region of right posterior basal ganglia hemorrhage. 6. Intracranial atherosclerosis with dense calcification of carotid siphons and areas of mild stenosis. Electronically Signed   By: Kristine Garbe M.D.   On: 03/23/2016 02:49   Dg Chest Port 1 View Result Date: 03/21/2016 CLINICAL DATA:  75 y/o M; sudden onset of weakness mostly in the legs with severe hypertension. EXAM: PORTABLE CHEST 1 VIEW COMPARISON:  12/12/2010 chest radiating FINDINGS: Stable cardiac silhouette within normal limits. Aortic atherosclerosis with arch calcifications. Two lead pacemaker with pacing unit changed from prior study. Clear lungs. No pneumothorax or pleural effusion. Rightward curvature and degenerative changes of thoracic spine. IMPRESSION: No acute pulmonary process identified. Electronically Signed   By: Kristine Garbe M.D.   On: 03/21/2016 22:31  Recent Labs: 03/21/2016: B Natriuretic Peptide 198.0 03/25/2016: ALT 16; BUN 11; Creatinine, Ser 0.99; Hemoglobin 13.4; Platelets 216; Potassium 3.7; Sodium 138    Lipid Panel    Component Value Date/Time   CHOL 111 03/22/2016 0528   TRIG 43 03/22/2016 0528   HDL 34 (L) 03/22/2016 0528   CHOLHDL 3.3 03/22/2016 0528   VLDL 9 03/22/2016 0528   LDLCALC 68 03/22/2016 0528     Wt Readings from Last 3 Encounters:  04/10/16 194 lb 9.6 oz (88.3 kg)  04/02/16 194 lb 7.1 oz (88.2 kg)  03/22/16 196 lb 6.9 oz (89.1 kg)     Other studies Reviewed: Additional studies/ records  that were reviewed today include: Office notes, hospital records and testing.  ASSESSMENT AND PLAN:  1.  HTN: I feel his BP would benefit from improved control. We will therefore increase his clonidine to 0.3 mg tid. He is to continue the amlodipine and was given a long-term rx. Other medications are unchanged. His wife  2. Easy irritation/anxiety: Part of this may be due to all the changes recently. His wife says the symptoms began prior to his stroke. If the symptoms do not improve with stabilization and improvement in his general medical condition, she is encouraged to discuss this with his primary MD.  3. JVD: His neck veins are up today, but CXR 10/13 was without edema, echo 10/15 had normal venous pressure and his weight is unchanged. He reports no symptoms. Follow for symptoms. Continue daily Lasix.  Current medicines are reviewed at length with the patient today.  The patient has concerns regarding medicines. Concerns were addressed.  The following changes have been made:  Increase clonidine and make amlodipine long-term  Labs/ tests ordered today include:  No orders of the defined types were placed in this encounter.  Disposition:   FU with Dr Sallyanne Kuster  Signed, Rosaria Ferries, PA-C  04/10/2016 1:37 PM    [facility] Phone: [phone]; Fax: [phone]  This note was written with the assistance of speech recognition software. Please excuse any transcriptional errors.

## 2016-04-10 NOTE — Telephone Encounter (Signed)
Was at another MD apptment in [place] and wife did not know he was to be here today. NF 04/10/2016 @ 2:38pm

## 2016-04-10 NOTE — Telephone Encounter (Signed)
No show, called and left message concerning missed apt.  Included next apt date and time with contract info given  Ercole Georg, LPTA; CBIS [phone]  

## 2016-04-10 NOTE — Patient Instructions (Addendum)
Medication Instructions:  INCREASE Clonidine to 0.3mg  take 1 tab by mouth twice a day Its ok to take 2 5mg  tablets of the Norvasc (Amlodopine) until you pick the script of Norvasc 10mg    Labwork: None   Testing/Procedures: None   Follow-up: Your physician wants you to follow-up in: October 2018 [place]. You will receive a reminder letter in the mail two months in advance. If you don't receive a letter, please call our office to schedule the follow-up appointment. Any Other Special Instructions Will Be Listed Below (If Applicable).     If you need a refill on your cardiac medications before your next appointment, please call your pharmacy.

## 2016-04-10 NOTE — Progress Notes (Signed)
Thank you :)

## 2016-04-11 ENCOUNTER — Ambulatory Visit (HOSPITAL_BASED_OUTPATIENT_CLINIC_OR_DEPARTMENT_OTHER): Payer: Medicare Other | Admitting: Physical Medicine & Rehabilitation

## 2016-04-11 ENCOUNTER — Encounter: Payer: Self-pay | Admitting: Physical Medicine & Rehabilitation

## 2016-04-11 ENCOUNTER — Telehealth: Payer: Self-pay | Admitting: Cardiovascular Disease

## 2016-04-11 ENCOUNTER — Encounter: Payer: Medicare Other | Admitting: Physical Medicine & Rehabilitation

## 2016-04-11 ENCOUNTER — Encounter: Payer: Medicare Other | Attending: Physical Medicine & Rehabilitation

## 2016-04-11 ENCOUNTER — Telehealth: Payer: Self-pay | Admitting: Physical Medicine & Rehabilitation

## 2016-04-11 VITALS — BP 183/90 | HR 60 | Resp 14

## 2016-04-11 DIAGNOSIS — I10 Essential (primary) hypertension: Secondary | ICD-10-CM | POA: Insufficient documentation

## 2016-04-11 DIAGNOSIS — I610 Nontraumatic intracerebral hemorrhage in hemisphere, subcortical: Secondary | ICD-10-CM

## 2016-04-11 DIAGNOSIS — I672 Cerebral atherosclerosis: Secondary | ICD-10-CM | POA: Diagnosis not present

## 2016-04-11 DIAGNOSIS — G4733 Obstructive sleep apnea (adult) (pediatric): Secondary | ICD-10-CM | POA: Diagnosis not present

## 2016-04-11 DIAGNOSIS — R270 Ataxia, unspecified: Secondary | ICD-10-CM | POA: Diagnosis not present

## 2016-04-11 DIAGNOSIS — I69398 Other sequelae of cerebral infarction: Secondary | ICD-10-CM | POA: Insufficient documentation

## 2016-04-11 DIAGNOSIS — Z87891 Personal history of nicotine dependence: Secondary | ICD-10-CM | POA: Insufficient documentation

## 2016-04-11 DIAGNOSIS — R269 Unspecified abnormalities of gait and mobility: Secondary | ICD-10-CM

## 2016-04-11 DIAGNOSIS — Z8673 Personal history of transient ischemic attack (TIA), and cerebral infarction without residual deficits: Secondary | ICD-10-CM | POA: Diagnosis not present

## 2016-04-11 DIAGNOSIS — Z950 Presence of cardiac pacemaker: Secondary | ICD-10-CM | POA: Diagnosis not present

## 2016-04-11 DIAGNOSIS — Z8249 Family history of ischemic heart disease and other diseases of the circulatory system: Secondary | ICD-10-CM | POA: Diagnosis not present

## 2016-04-11 DIAGNOSIS — I69351 Hemiplegia and hemiparesis following cerebral infarction affecting right dominant side: Secondary | ICD-10-CM | POA: Insufficient documentation

## 2016-04-11 DIAGNOSIS — Z823 Family history of stroke: Secondary | ICD-10-CM | POA: Insufficient documentation

## 2016-04-11 MED ORDER — CLONIDINE HCL 0.3 MG PO TABS: 0.3000 mg | ORAL_TABLET | Freq: Three times a day (TID) | ORAL | 6 refills | Status: DC

## 2016-04-11 NOTE — Patient Instructions (Signed)
Richard Alvarado  04/10/2016 1:37 PM    [facility] Phone: [phone]; Fax: [phone]   Please call to clarify whether clonidine is twice a day vs three times a day The note says .3mg  three times a day , the prescription says twice a day

## 2016-04-11 NOTE — Telephone Encounter (Signed)
verfied dosage with rhonda barrett pa-- Called --Left message for kent at dr kirsten's office.  Spoke wife informed new prescription for 0.3 mg clonidine three times a day.  e-sent to pharmacy.

## 2016-04-11 NOTE — Telephone Encounter (Signed)
Richard Alvarado with [facility] returning call to Yvone Neu, they will call patient to let them know that they should be taking medication 3 x day.  Any questions please call Richard Alvarado back at [phone].

## 2016-04-11 NOTE — Addendum Note (Signed)
Addended by: Ulice Brilliant T on: 04/11/2016 02:14 PM   Modules accepted: Orders

## 2016-04-11 NOTE — Progress Notes (Signed)
Subjective:  Transitional care  Patient ID: Richard Alvarado, male    DOB: 1940-09-11, 75 y.o.   MRN: [id number]  75 year old male with history of HTN, OSA, PPM, prior CVA 2014; who was admitted on 03/21/16 with development of BLE weakness and difficulty standing/walking while at football game. UDS negative. He was taken to [facility] where CT head done revealing small acute R-BG hemorrhage and elevated BP. He was started on cardene drip and transferred to [facility] for treatment/evalution. CTA head/neck done revealing stable acute punctate right posterior BG hemorrhage, stable advanced microvascular disease, mild 20% stenosis proximal ICA, likely moderate stenosis L-VA and intracranial atherosclerosis. Patient with resultant mild left facial weakness, left inattention,  left pronator drift, decrease in [facility] and mild left LE weakness HPI Walks in home only Standing and showering independatly Dressing independantly  No falls Using can to walk only outside the house Seen by PCP and by Cardiology Cardiology NP increased clonidine .3mg  TID as per note but  Rx was .3mg  BID BP elevated in clinic this am    Some numbness in Left leg Arm without numbness No vision problems No bowel or bladder issues  Pain Inventory Average Pain 0 Pain Right Now 0 My pain is no pain  In the last 24 hours, has pain interfered with the following? General activity 0 Relation with others 0 Enjoyment of life 0 What TIME of day is your pain at its worst? no pain Sleep (in general) Good  Pain is worse with: no pain Pain improves with: no pain Relief from Meds: no pain  Mobility walk without assistance walk with assistance use a cane how many minutes can you walk? varies ability to climb steps?  yes do you drive?  no  Function retired I need assistance with the following:  meal prep  Neuro/Psych No problems in this area  Prior Studies hospital f/u  Physicians involved in your care hospital  f/u   Family History  Problem Relation Age of Onset  . CVA Mother   . Hypertension Sister   . Heart disease Maternal Grandmother    Social History   Social History  . Marital status: Married    Spouse name: N/A  . Number of children: N/A  . Years of education: N/A   Social History Main Topics  . Smoking status: Former Smoker    Quit date: 06/09/1999  . Smokeless tobacco: Never Used  . Alcohol use No  . Drug use: No  . Sexual activity: Not Asked   Other Topics Concern  . None   Social History Narrative  . None   Past Surgical History:  Procedure Laterality Date  . EP IMPLANTABLE DEVICE N/A 11/21/2014   Procedure: PPM/BIV PPM Generator Changeout;  Surgeon: [name], MD;  Location: [facility];  Service: Cardiovascular;  Laterality: N/A;  . NM MYOVIEW LTD  03/31/2012   Normal study, no ECG changes, EKG negative for ischemia, post-stress EF 54%  . PACEMAKER INSERTION  05/09/2005   St. Jude [place], [place] #[id number], serial #[id number]   Past Medical History:  Diagnosis Date  . Cerebral atherosclerosis    CAROTID DOPPLER, 09/07/2009 - RIGHT AND LEFT CCAs-small-moderate amount of irregular mixed density plaque, no significant evidence of diameter reduction; RIGHT AND LEFT ICAs-moderate amount irregular mixed density plaque, no evidence of significant diameter reduction  . CVA (cerebral vascular accident) ([place]) 1994   Left brain  . History of CVA (cerebrovascular accident) 04/24/2013  . Hypertension  RENAL DOPPLER, 03/08/2009 - normal  . Hypertension in pregnancy, preeclampsia, severe 04/24/2013  . Obstructive sleep apnea 09/26/2005   AHI-19.46, during REM-36.88  . Pacemaker St. Jude victory, dual chamber, 2006 04/24/2013  . Pericardial effusion    2D ECHO, 03/07/2011 - EF >70%, normal  . Second degree heart block 04/24/2013   BP (!) 188/92 (BP Location: Left Arm, Patient Position: Sitting, Cuff Size: Large)   Pulse 60   Resp 14   SpO2 97%   Opioid Risk Score:    Fall Risk Score:  `1  Depression screen PHQ 2/9  Depression screen PHQ 2/9 04/11/2016  Decreased Interest 0  Down, Depressed, Hopeless 0  PHQ - 2 Score 0  Altered sleeping 0  Tired, decreased energy 1  Change in appetite 0  Feeling bad or failure about yourself  0  Trouble concentrating 0  Moving slowly or fidgety/restless 0  Suicidal thoughts 0  PHQ-9 Score 1  Difficult doing work/chores Not difficult at all    Review of Systems  Constitutional: Negative.   HENT: Negative.   Eyes: Negative.   Respiratory: Negative.   Cardiovascular: Negative.   Gastrointestinal: Negative.   Endocrine: Negative.   Genitourinary: Negative.   Musculoskeletal: Negative.   Skin: Negative.   Allergic/Immunologic: Negative.   Neurological: Negative.   Hematological: Negative.   Psychiatric/Behavioral: Negative.   All other systems reviewed and are negative.      Objective:   Physical Exam  Constitutional: He is oriented to person, place, and time. He appears well-developed and well-nourished.  HENT:  Head: Normocephalic and atraumatic.  Eyes: Conjunctivae and EOM are normal. Pupils are equal, round, and reactive to light.  Cardiovascular: Normal rate, regular rhythm and normal heart sounds.   Pulmonary/Chest: Breath sounds normal. No respiratory distress.  Abdominal: Soft. Bowel sounds are normal. He exhibits no distension.  Neurological: He is alert and oriented to person, place, and time.  Skin: Skin is warm and dry.  Psychiatric: He has a normal mood and affect.  Nursing note and vitals reviewed.  5/5 bilateral HF KE ADF, R Delt, bi, tri, grip 4+ Left delt Bi tri grip  Gait without toe drag or knee instability. Unable to perform tandem gait. Tends to fall towards the left. Has some difficulty performing heel walking on the left side. Toe walking is also difficult mainly due to balance. Tends to fall backward.  Romberg is positive felt toward the right           Assessment & Plan:  1.  Left hemiparesis secondary to right CVA. Onset 03/21/16  Continue outpatient PT, OT No driving for now Follow-up physical medicine and rehabilitation 1 month  2. Hypertension being managed by cardiology Progress note from yesterday indicated. Clonidine would be increased 0.3mg  3 times a day Prescription was written for  .3 mg twice a day, called office for clarification 3. Diabetes managed by primary care

## 2016-04-11 NOTE — Telephone Encounter (Signed)
noted 

## 2016-04-11 NOTE — Telephone Encounter (Signed)
New message  Please call patient back    Please clarification patient dosage - direction clonidine (CATAPRES) 0.3 MG tablet  B/p today 188/92

## 2016-04-16 ENCOUNTER — Ambulatory Visit (HOSPITAL_COMMUNITY): Payer: Medicare Other

## 2016-04-16 DIAGNOSIS — M6281 Muscle weakness (generalized): Secondary | ICD-10-CM | POA: Diagnosis not present

## 2016-04-16 DIAGNOSIS — R29898 Other symptoms and signs involving the musculoskeletal system: Secondary | ICD-10-CM | POA: Diagnosis not present

## 2016-04-16 DIAGNOSIS — R270 Ataxia, unspecified: Secondary | ICD-10-CM

## 2016-04-16 DIAGNOSIS — I619 Nontraumatic intracerebral hemorrhage, unspecified: Secondary | ICD-10-CM

## 2016-04-16 DIAGNOSIS — R2681 Unsteadiness on feet: Secondary | ICD-10-CM | POA: Diagnosis not present

## 2016-04-16 DIAGNOSIS — R262 Difficulty in walking, not elsewhere classified: Secondary | ICD-10-CM | POA: Diagnosis not present

## 2016-04-16 NOTE — Therapy (Signed)
[place], [place], [address] Phone: [phone]   Fax:  [phone]  Physical Therapy Treatment  Patient Details  Name: Richard Alvarado MRN: [id number] Date of Birth: 06-09-1941 Referring Provider: Alysia Penna   Encounter Date: 04/16/2016      PT End of Session - 04/16/16 1522    Visit Number 2   Number of Visits 12   Date for PT Re-Evaluation 04/29/16   Authorization Type Medicare    Authorization Time Period 04/08/16 to 05/20/16   Authorization - Visit Number 1   Authorization - Number of Visits 10   PT Start Time [id number]   PT Stop Time 1515   PT Time Calculation (min) 44 min   Equipment Utilized During Treatment Gait belt   Activity Tolerance Patient tolerated treatment well   Behavior During Therapy [facility] for tasks assessed/performed      Past Medical History:  Diagnosis Date  . Cerebral atherosclerosis    CAROTID DOPPLER, 09/07/2009 - RIGHT AND LEFT CCAs-small-moderate amount of irregular mixed density plaque, no significant evidence of diameter reduction; RIGHT AND LEFT ICAs-moderate amount irregular mixed density plaque, no evidence of significant diameter reduction  . CVA (cerebral vascular accident) ([place]) 1994   Left brain  . History of CVA (cerebrovascular accident) 04/24/2013  . Hypertension    RENAL DOPPLER, 03/08/2009 - normal  . Hypertension in pregnancy, preeclampsia, severe 04/24/2013  . Obstructive sleep apnea 09/26/2005   AHI-19.46, during REM-36.88  . Pacemaker St. Jude victory, dual chamber, 2006 04/24/2013  . Pericardial effusion    2D ECHO, 03/07/2011 - EF >70%, normal  . Second degree heart block 04/24/2013    Past Surgical History:  Procedure Laterality Date  . EP IMPLANTABLE DEVICE N/A 11/21/2014   Procedure: PPM/BIV PPM Generator Changeout;  Surgeon: [name], MD;  Location: [facility];  Service: Cardiovascular;  Laterality: N/A;  . NM MYOVIEW LTD  03/31/2012    Normal study, no ECG changes, EKG negative for ischemia, post-stress EF 54%  . PACEMAKER INSERTION  05/09/2005   St. Jude [place], [place] #[id number], serial [phone]    There were no vitals filed for this visit.      Subjective Assessment - 04/16/16 1438    Subjective No falls or close calls. Pt staets he has been able to do his HEP.     Pertinent History beta-blockers, pacemaker, HTN with history of hypertensive emergency, OSA, DM, ICH of basal ganglia   How long can you stand comfortably? 15 minutes    How long can you walk comfortably? 30-45 minutes    Patient Stated Goals get stronger, improve balance, get back to PLOF    Currently in Pain? No/denies            [facility] PT Assessment - 04/16/16 0001      6 minute walk test results    Aerobic Endurance Distance Walked 622   Endurance additional comments 3MWT, no device, L hip circumduction with increased L foot supination, and slowed turns                     [facility] Adult PT Treatment/Exercise - 04/16/16 0001      Transfers   Five time sit to stand comments  13.64 using UE's for support on the chair.      Lumbar Exercises: Seated   Other Seated Lumbar Exercises trunk rotation with blue med ball down towards each foot.  Knee/Hip Exercises: Supine   Bridges Strengthening;Both;2 sets;10 reps  stabilization at the L ankle to prevent supination     Knee/Hip Exercises: Sidelying   Clams 2 x 10 reps bilaterally             Balance Exercises - 04/16/16 1509      Balance Exercises: Standing   Standing Eyes Opened Narrow base of support (BOS);Foam/compliant surface  x 1 min   Tandem Stance Eyes open;Foam/compliant surface  with weight shifting to fwd and back & rot x 10 reps each.            PT Education - 04/16/16 1521    Education provided Yes   Education Details Encouraged to continue working on HEP.  Educated pt on correct technique   Person(s) Educated Patient   Methods  Explanation;Demonstration   Comprehension Verbalized understanding;Returned demonstration          PT Short Term Goals - 04/16/16 1526      PT SHORT TERM GOAL #1   Title Patient to be able to verbally state 5/5 safety concerns/precautions in order to show improved situational awareness and reduce fall risk    Time 3   Period Weeks   Status New     PT SHORT TERM GOAL #2   Title Patient to score at least a 47 on BERG balance test in order to show reduced fall risk and improved safety with mobility    Time 3   Period Weeks   Status New     PT SHORT TERM GOAL #3   Title Patient to be able to ambulate house hold distances with no device, minimal unsteadiness, and good safety awareness in order to show improved general mobiltiy    Time 3   Period Weeks   Status New     PT SHORT TERM GOAL #4   Title Patient to correctly and consistently perform appropriate HEP, to be updated PRN    Time 1   Period Weeks   Status New           PT Long Term Goals - 04/16/16 1526      PT LONG TERM GOAL #1   Title Patient to demonstrate functional strength as being 5/5 in all tested groups in order to improve stability with gait/mobiltiy and improve functional task performance    Time 6   Period Weeks   Status New     PT LONG TERM GOAL #2   Title Patient to score at least a 52 on BERG balance test in order to show improved general mobility and reduced fall risk    Time 6   Period Weeks   Status New     PT LONG TERM GOAL #3   Title Patient to be able to ambulate unlimited distances with no assistive device, minimal unsteadiness, and no increased fall risk in order to improve general mobiltiy and facilitate return to PLOF    Time 6   Period Weeks   Status New     PT LONG TERM GOAL #4   Title Patient to be able to ambulate outside over grass/gravel/etc with no assistive device and minimal unsteadiness/fall risk in order to facilitate return to PLOF    Time 6   Period Weeks   Status New      PT LONG TERM GOAL #5   Title Patient to be participatory in appropriate regular aerobic exercise program, at least 30 minutes in duration and at least 4 days per week,  in order to maintain functional gains and improve general health status    Time 6   Period Weeks   Status New               Plan - 04/16/16 1525    Clinical Impression Statement Pt demonstrates improved gait, and ability to perform 3MWT without using cane today, and increased distance.  Noted diminished balance when turning corners.  Pt states that is what he has the most trouble with - when he turns too quickly he will loose his balances, however he has not had any falls.  Pt continues to demonstrate weakness on the L side, noted with increased supination of L foot during gait.  Will continue to progress pt with functional mobility, balance and strength to optimize his return to PLOF.    Rehab Potential Excellent   Clinical Impairments Affecting Rehab Potential highly motivated to participate in skilled PT services    PT Frequency 2x / week   PT Duration 6 weeks   PT Treatment/Interventions ADLs/Self Care Home Management;Biofeedback;Cryotherapy;Moist Heat;DME Instruction;Gait training;Stair training;Functional mobility training;Therapeutic activities;Therapeutic exercise;Balance training;Neuromuscular re-education;Patient/family education;Manual techniques;Energy conservation;Taping   PT Next Visit Plan Weight shifting in standing, standing balance with rotation, L ankle stability and strength.    PT Home Exercise Plan 04/08/16: sidelying clams with green TB, sit to stand with equal weight bearing, tandem stance at counter   Consulted and Agree with Plan of Care Patient      Patient will benefit from skilled therapeutic intervention in order to improve the following deficits and impairments:  Abnormal gait, Improper body mechanics, Decreased coordination, Decreased mobility, Postural dysfunction, Decreased strength,  Decreased balance, Difficulty walking, Decreased safety awareness  Visit Diagnosis: Unsteadiness on feet  Difficulty in walking, not elsewhere classified  Muscle weakness (generalized)  Other symptoms and signs involving the musculoskeletal system  Nontraumatic acute hemorrhage of basal ganglia (HCC)  Ataxia     Problem List Patient Active Problem List   Diagnosis Date Noted  . Gait disturbance, post-stroke 04/11/2016  . DM (diabetes mellitus), type 2 with neurological complications ([place])   . Essential hypertension   . Family history of stroke 03/24/2016  . OSA (obstructive sleep apnea) 03/24/2016  . Hypokalemia 03/24/2016  . Nontraumatic acute hemorrhage of basal ganglia ([place]) 03/24/2016  . Ataxia   . ICH (intracerebral hemorrhage) ([place]) - R basal ganglia 03/22/2016  . Hypertensive emergency   . Diabetes mellitus type 2 in nonobese ([place]) 03/19/2016  . Pacemaker battery depletion 11/14/2014  . Hyperlipidemia 11/01/2013  . Severe hypertension 04/24/2013  . Pericardial effusion secondary to minoxidil 04/24/2013  . History of CVA (cerebrovascular accident) 04/24/2013  . Second degree heart block 04/24/2013  . Pacemaker St. Jude victory, dual chamber, 2006 04/24/2013  . Cervicalgia 01/01/2011  . Muscle weakness (generalized) 01/01/2011    Beth Ajay Strubel, PT, DPT X: [phone]   [facility] [address] [place], [place], [address] Phone: [phone]   Fax:  [phone]  Name: Richard Alvarado MRN: [id number] Date of Birth: 24-Dec-1940

## 2016-04-18 ENCOUNTER — Ambulatory Visit (HOSPITAL_COMMUNITY): Payer: Medicare Other

## 2016-04-18 DIAGNOSIS — R2681 Unsteadiness on feet: Secondary | ICD-10-CM | POA: Diagnosis not present

## 2016-04-18 DIAGNOSIS — R270 Ataxia, unspecified: Secondary | ICD-10-CM

## 2016-04-18 DIAGNOSIS — I619 Nontraumatic intracerebral hemorrhage, unspecified: Secondary | ICD-10-CM

## 2016-04-18 DIAGNOSIS — M6281 Muscle weakness (generalized): Secondary | ICD-10-CM | POA: Diagnosis not present

## 2016-04-18 DIAGNOSIS — R29898 Other symptoms and signs involving the musculoskeletal system: Secondary | ICD-10-CM

## 2016-04-18 DIAGNOSIS — R262 Difficulty in walking, not elsewhere classified: Secondary | ICD-10-CM | POA: Diagnosis not present

## 2016-04-18 NOTE — Therapy (Signed)
[place], [place], [address] Phone: [phone]   Fax:  [phone]  Physical Therapy Treatment  Patient Details  Name: Richard Alvarado MRN: [id number] Date of Birth: [birth year] Referring Provider: Alysia Penna   Encounter Date: 04/18/2016      PT End of Session - 04/18/16 0945    Visit Number 3   Number of Visits 12   Date for PT Re-Evaluation 04/29/16   Authorization Type Medicare    Authorization Time Period 04/08/16 to 05/20/16   Authorization - Visit Number 3   Authorization - Number of Visits 10   PT Start Time 0943   PT Stop Time 1030   PT Time Calculation (min) 47 min   Equipment Utilized During Treatment Gait belt   Activity Tolerance Patient tolerated treatment well   Behavior During Therapy [facility] for tasks assessed/performed      Past Medical History:  Diagnosis Date  . Cerebral atherosclerosis    CAROTID DOPPLER, 09/07/2009 - RIGHT AND LEFT CCAs-small-moderate amount of irregular mixed density plaque, no significant evidence of diameter reduction; RIGHT AND LEFT ICAs-moderate amount irregular mixed density plaque, no evidence of significant diameter reduction  . CVA (cerebral vascular accident) ([place]) 1994   Left brain  . History of CVA (cerebrovascular accident) 04/24/2013  . Hypertension    RENAL DOPPLER, 03/08/2009 - normal  . Hypertension in pregnancy, preeclampsia, severe 04/24/2013  . Obstructive sleep apnea 09/26/2005   AHI-19.46, during REM-36.88  . Pacemaker St. Jude victory, dual chamber, 2006 04/24/2013  . Pericardial effusion    2D ECHO, 03/07/2011 - EF >70%, normal  . Second degree heart block 04/24/2013    Past Surgical History:  Procedure Laterality Date  . EP IMPLANTABLE DEVICE N/A 11/21/2014   Procedure: PPM/BIV PPM Generator Changeout;  Surgeon: [name], MD;  Location: [facility];  Service: Cardiovascular;  Laterality: N/A;  . NM MYOVIEW LTD  03/31/2012    Normal study, no ECG changes, EKG negative for ischemia, post-stress EF 54%  . PACEMAKER INSERTION  05/09/2005   St. Jude [place], [place] #5816, serial [phone]    There were no vitals filed for this visit.      Subjective Assessment - 04/18/16 0939    Subjective Pt stated no reports of recent falls or close calls, no reports of pain as well today.  Reports compliance with HEP daily.   Pertinent History beta-blockers, pacemaker, HTN with history of hypertensive emergency, OSA, DM, ICH of basal ganglia   Patient Stated Goals get stronger, improve balance, get back to PLOF    Currently in Pain? No/denies           [facility] Adult PT Treatment/Exercise - 04/18/16 0001      Knee/Hip Exercises: Standing   Heel Raises Both;10 reps;2 sets   Heel Raises Limitations Heel and toe raises; cueing to reduce Lt ankle supinate   Rocker Board 2 minutes   Rocker Board Limitations R/L and A/P     Knee/Hip Exercises: Seated   Other Seated Knee/Hip Exercises BAPS L2 DF/PF; pronation; towel slides for supination/pronation   Sit to Sand 2 sets;5 reps;without UE support  Cueing to increase weight bearing Lt LE     Knee/Hip Exercises: Supine   Bridges Strengthening;Both;2 sets;10 reps             Balance Exercises - 04/18/16 1040      Balance Exercises: Standing   Tandem Stance Eyes open;3 reps;30 secs;Foam/compliant surface  1  set on Airex   SLS Eyes open;3 reps  Lt 9", Rt 20"   Rockerboard Anterior/posterior;Lateral;UE support  2 min each cueing for Lt foot placement to reduce supination   Cone Rotation Foam/compliant surface   Cone Rotation Limitations Rotation on foam with cones as well as blue weighted ball             PT Short Term Goals - 04/16/16 1526      PT SHORT TERM GOAL #1   Title Patient to be able to verbally state 5/5 safety concerns/precautions in order to show improved situational awareness and reduce fall risk    Time 3   Period Weeks   Status New     PT  SHORT TERM GOAL #2   Title Patient to score at least a 47 on BERG balance test in order to show reduced fall risk and improved safety with mobility    Time 3   Period Weeks   Status New     PT SHORT TERM GOAL #3   Title Patient to be able to ambulate house hold distances with no device, minimal unsteadiness, and good safety awareness in order to show improved general mobiltiy    Time 3   Period Weeks   Status New     PT SHORT TERM GOAL #4   Title Patient to correctly and consistently perform appropriate HEP, to be updated PRN    Time 1   Period Weeks   Status New           PT Long Term Goals - 04/16/16 1526      PT LONG TERM GOAL #1   Title Patient to demonstrate functional strength as being 5/5 in all tested groups in order to improve stability with gait/mobiltiy and improve functional task performance    Time 6   Period Weeks   Status New     PT LONG TERM GOAL #2   Title Patient to score at least a 52 on BERG balance test in order to show improved general mobility and reduced fall risk    Time 6   Period Weeks   Status New     PT LONG TERM GOAL #3   Title Patient to be able to ambulate unlimited distances with no assistive device, minimal unsteadiness, and no increased fall risk in order to improve general mobiltiy and facilitate return to PLOF    Time 6   Period Weeks   Status New     PT LONG TERM GOAL #4   Title Patient to be able to ambulate outside over grass/gravel/etc with no assistive device and minimal unsteadiness/fall risk in order to facilitate return to PLOF    Time 6   Period Weeks   Status New     PT LONG TERM GOAL #5   Title Patient to be participatory in appropriate regular aerobic exercise program, at least 30 minutes in duration and at least 4 days per week, in order to maintain functional gains and improve general health status    Time 6   Period Weeks   Status New               Plan - 04/18/16 1045    Clinical Impression Statement  Session focus on improving functional strengthening and balance training.  Additional exercises complete to improve ankle strengthening and progress balance.  Therapist facilitation for safety and proper form/technqiue with new exercises.  Pt did require cueing to improve Lt foot placement and reduce  excessive supination with majority of exercises.  No reports of pain through session, was limited by fatigue.   Rehab Potential Excellent   Clinical Impairments Affecting Rehab Potential highly motivated to participate in skilled PT services    PT Frequency 2x / week   PT Duration 6 weeks   PT Treatment/Interventions ADLs/Self Care Home Management;Biofeedback;Cryotherapy;Moist Heat;DME Instruction;Gait training;Stair training;Functional mobility training;Therapeutic activities;Therapeutic exercise;Balance training;Neuromuscular re-education;Patient/family education;Manual techniques;Energy conservation;Taping   PT Next Visit Plan Weight shifting in standing, standing balance with rotation, L ankle stability and strength.    PT Home Exercise Plan 04/08/16: sidelying clams with green TB, sit to stand with equal weight bearing, tandem stance at counter      Patient will benefit from skilled therapeutic intervention in order to improve the following deficits and impairments:  Abnormal gait, Improper body mechanics, Decreased coordination, Decreased mobility, Postural dysfunction, Decreased strength, Decreased balance, Difficulty walking, Decreased safety awareness  Visit Diagnosis: Unsteadiness on feet  Difficulty in walking, not elsewhere classified  Muscle weakness (generalized)  Other symptoms and signs involving the musculoskeletal system  Nontraumatic acute hemorrhage of basal ganglia (HCC)  Ataxia     Problem List Patient Active Problem List   Diagnosis Date Noted  . Gait disturbance, post-stroke 04/11/2016  . DM (diabetes mellitus), type 2 with neurological complications ([place])   .  Essential hypertension   . Family history of stroke 03/24/2016  . OSA (obstructive sleep apnea) 03/24/2016  . Hypokalemia 03/24/2016  . Nontraumatic acute hemorrhage of basal ganglia ([place]) 03/24/2016  . Ataxia   . ICH (intracerebral hemorrhage) ([place]) - R basal ganglia 03/22/2016  . Hypertensive emergency   . Diabetes mellitus type 2 in nonobese ([place]) 03/19/2016  . Pacemaker battery depletion 11/14/2014  . Hyperlipidemia 11/01/2013  . Severe hypertension 04/24/2013  . Pericardial effusion secondary to minoxidil 04/24/2013  . History of CVA (cerebrovascular accident) 04/24/2013  . Second degree heart block 04/24/2013  . Pacemaker St. Jude victory, dual chamber, 2006 04/24/2013  . Cervicalgia 01/01/2011  . Muscle weakness (generalized) 01/01/2011   Richard Alvarado, Richard Alvarado; [place]  Richard Alvarado 04/18/2016, 10:52 AM  [place], [place], [address] Phone: [phone]   Fax:  [phone]  Name: Richard Alvarado MRN: [id number] Date of Birth: [birth year]

## 2016-04-23 ENCOUNTER — Ambulatory Visit (HOSPITAL_COMMUNITY): Payer: Medicare Other | Admitting: Physical Therapy

## 2016-04-23 DIAGNOSIS — R29898 Other symptoms and signs involving the musculoskeletal system: Secondary | ICD-10-CM

## 2016-04-23 DIAGNOSIS — R262 Difficulty in walking, not elsewhere classified: Secondary | ICD-10-CM | POA: Diagnosis not present

## 2016-04-23 DIAGNOSIS — M6281 Muscle weakness (generalized): Secondary | ICD-10-CM

## 2016-04-23 DIAGNOSIS — R2681 Unsteadiness on feet: Secondary | ICD-10-CM

## 2016-04-23 DIAGNOSIS — R270 Ataxia, unspecified: Secondary | ICD-10-CM | POA: Diagnosis not present

## 2016-04-23 DIAGNOSIS — I619 Nontraumatic intracerebral hemorrhage, unspecified: Secondary | ICD-10-CM | POA: Diagnosis not present

## 2016-04-23 NOTE — Therapy (Signed)
[place], [place], [address] Phone: [phone]   Fax:  [phone]  Physical Therapy Treatment  Patient Details  Name: Richard Alvarado MRN: [id number] Date of Birth: 09/18/1940 Referring Provider: Alysia Penna   Encounter Date: 04/23/2016      PT End of Session - 04/23/16 1601    Visit Number 4   Number of Visits 12   Date for PT Re-Evaluation 04/29/16   Authorization Type Medicare    Authorization Time Period 04/08/16 to 05/20/16   Authorization - Visit Number 4   Authorization - Number of Visits 10   PT Start Time [id number]   PT Stop Time 1600   PT Time Calculation (min) 42 min   Activity Tolerance Patient tolerated treatment well   Behavior During Therapy [facility] for tasks assessed/performed      Past Medical History:  Diagnosis Date  . Cerebral atherosclerosis    CAROTID DOPPLER, 09/07/2009 - RIGHT AND LEFT CCAs-small-moderate amount of irregular mixed density plaque, no significant evidence of diameter reduction; RIGHT AND LEFT ICAs-moderate amount irregular mixed density plaque, no evidence of significant diameter reduction  . CVA (cerebral vascular accident) ([place]) 1994   Left brain  . History of CVA (cerebrovascular accident) 04/24/2013  . Hypertension    RENAL DOPPLER, 03/08/2009 - normal  . Hypertension in pregnancy, preeclampsia, severe 04/24/2013  . Obstructive sleep apnea 09/26/2005   AHI-19.46, during REM-36.88  . Pacemaker St. Jude victory, dual chamber, 2006 04/24/2013  . Pericardial effusion    2D ECHO, 03/07/2011 - EF >70%, normal  . Second degree heart block 04/24/2013    Past Surgical History:  Procedure Laterality Date  . EP IMPLANTABLE DEVICE N/A 11/21/2014   Procedure: PPM/BIV PPM Generator Changeout;  Surgeon: [name], MD;  Location: [facility];  Service: Cardiovascular;  Laterality: N/A;  . NM MYOVIEW LTD  03/31/2012   Normal study, no ECG changes, EKG negative for  ischemia, post-stress EF 54%  . PACEMAKER INSERTION  05/09/2005   St. Jude [place], [place] #[id number], serial [phone]    There were no vitals filed for this visit.      Subjective Assessment - 04/23/16 1521    Subjective Patient arrives today doing well, no major changes since last session    Pertinent History beta-blockers, pacemaker, HTN with history of hypertensive emergency, OSA, DM, ICH of basal ganglia   Patient Stated Goals get stronger, improve balance, get back to PLOF    Currently in Pain? No/denies                         [facility] Adult PT Treatment/Exercise - 04/23/16 0001      Knee/Hip Exercises: Standing   Heel Raises Both;1 set;15 reps   Heel Raises Limitations heel and toe    Lateral Step Up Both;1 set;15 reps   Lateral Step Up Limitations 6 inch box    Forward Step Up Both;1 set;15 reps   Forward Step Up Limitations 6 inch box    Functional Squat 1 set;10 reps   Functional Squat Limitations at mat table, cues for form    Rocker Board 2 minutes   Rocker Board Limitations lateral and AP no UEs      Knee/Hip Exercises: Supine   Bridges Both;1 set;15 reps   Other Supine Knee/Hip Exercises supine hip ABD 1x10 red TB      Knee/Hip Exercises: Sidelying   Clams 1x15 green TB  Knee/Hip Exercises: Prone   Straight Leg Raises 10 reps             Balance Exercises - 04/23/16 1545      Balance Exercises: Standing   Standing Eyes Closed Narrow base of support (BOS);Foam/compliant surface;3 reps;20 secs   Tandem Stance 2 reps;15 secs;Foam/compliant surface   SLS 3 reps;Solid surface   Other Standing Exercises cone tap activity on solid surface on one plane            PT Education - 04/23/16 1601    Education provided No          PT Short Term Goals - 04/16/16 1526      PT SHORT TERM GOAL #1   Title Patient to be able to verbally state 5/5 safety concerns/precautions in order to show improved situational awareness and reduce fall risk     Time 3   Period Weeks   Status New     PT SHORT TERM GOAL #2   Title Patient to score at least a 47 on BERG balance test in order to show reduced fall risk and improved safety with mobility    Time 3   Period Weeks   Status New     PT SHORT TERM GOAL #3   Title Patient to be able to ambulate house hold distances with no device, minimal unsteadiness, and good safety awareness in order to show improved general mobiltiy    Time 3   Period Weeks   Status New     PT SHORT TERM GOAL #4   Title Patient to correctly and consistently perform appropriate HEP, to be updated PRN    Time 1   Period Weeks   Status New           PT Long Term Goals - 04/16/16 1526      PT LONG TERM GOAL #1   Title Patient to demonstrate functional strength as being 5/5 in all tested groups in order to improve stability with gait/mobiltiy and improve functional task performance    Time 6   Period Weeks   Status New     PT LONG TERM GOAL #2   Title Patient to score at least a 52 on BERG balance test in order to show improved general mobility and reduced fall risk    Time 6   Period Weeks   Status New     PT LONG TERM GOAL #3   Title Patient to be able to ambulate unlimited distances with no assistive device, minimal unsteadiness, and no increased fall risk in order to improve general mobiltiy and facilitate return to PLOF    Time 6   Period Weeks   Status New     PT LONG TERM GOAL #4   Title Patient to be able to ambulate outside over grass/gravel/etc with no assistive device and minimal unsteadiness/fall risk in order to facilitate return to PLOF    Time 6   Period Weeks   Status New     PT LONG TERM GOAL #5   Title Patient to be participatory in appropriate regular aerobic exercise program, at least 30 minutes in duration and at least 4 days per week, in order to maintain functional gains and improve general health status    Time 6   Period Weeks   Status New               Plan  - 04/23/16 1602    Clinical Impression Statement Focused  on functional strength and balance in CKC positions today with occasional cues for form today, progression of standing activities with addition of new tasks. Patient continues to demonstrate a high level of motivation with skilled PT services and remains compliant with HEP thus far. Note ongoing difficulty with proprioception and coordination as evidenced by difficulty with tasks such as rocker board and more advanced balance tasks. Noted supination of both ankles in supine, indicated possible mechanical cause of ankle positioning during gait.    Rehab Potential Excellent   Clinical Impairments Affecting Rehab Potential highly motivated to participate in skilled PT services    PT Frequency 2x / week   PT Duration 6 weeks   PT Treatment/Interventions ADLs/Self Care Home Management;Biofeedback;Cryotherapy;Moist Heat;DME Instruction;Gait training;Stair training;Functional mobility training;Therapeutic activities;Therapeutic exercise;Balance training;Neuromuscular re-education;Patient/family education;Manual techniques;Energy conservation;Taping   PT Next Visit Plan CKC activities and exercise however continue to focus on hip extensor/abd strength on mat table; continue focus on balance activities    PT Home Exercise Plan 04/08/16: sidelying clams with green TB, sit to stand with equal weight bearing, tandem stance at counter   Consulted and Agree with Plan of Care Patient      Patient will benefit from skilled therapeutic intervention in order to improve the following deficits and impairments:  Abnormal gait, Improper body mechanics, Decreased coordination, Decreased mobility, Postural dysfunction, Decreased strength, Decreased balance, Difficulty walking, Decreased safety awareness  Visit Diagnosis: Unsteadiness on feet  Difficulty in walking, not elsewhere classified  Muscle weakness (generalized)  Other symptoms and signs involving the  musculoskeletal system     Problem List Patient Active Problem List   Diagnosis Date Noted  . Gait disturbance, post-stroke 04/11/2016  . DM (diabetes mellitus), type 2 with neurological complications ([place])   . Essential hypertension   . Family history of stroke 03/24/2016  . OSA (obstructive sleep apnea) 03/24/2016  . Hypokalemia 03/24/2016  . Nontraumatic acute hemorrhage of basal ganglia ([place]) 03/24/2016  . Ataxia   . ICH (intracerebral hemorrhage) ([place]) - R basal ganglia 03/22/2016  . Hypertensive emergency   . Diabetes mellitus type 2 in nonobese ([place]) 03/19/2016  . Pacemaker battery depletion 11/14/2014  . Hyperlipidemia 11/01/2013  . Severe hypertension 04/24/2013  . Pericardial effusion secondary to minoxidil 04/24/2013  . History of CVA (cerebrovascular accident) 04/24/2013  . Second degree heart block 04/24/2013  . Pacemaker St. Jude victory, dual chamber, 2006 04/24/2013  . Cervicalgia 01/01/2011  . Muscle weakness (generalized) 01/01/2011    Deniece Ree PT, DPT [facility] [address] [place], [place], [address] Phone: [phone]   Fax:  [phone]  Name: Richard Alvarado MRN: [id number] Date of Birth: [birth year]

## 2016-04-24 ENCOUNTER — Ambulatory Visit (HOSPITAL_COMMUNITY): Payer: Medicare Other

## 2016-04-24 DIAGNOSIS — R262 Difficulty in walking, not elsewhere classified: Secondary | ICD-10-CM

## 2016-04-24 DIAGNOSIS — M6281 Muscle weakness (generalized): Secondary | ICD-10-CM

## 2016-04-24 DIAGNOSIS — R2681 Unsteadiness on feet: Secondary | ICD-10-CM

## 2016-04-24 DIAGNOSIS — R29898 Other symptoms and signs involving the musculoskeletal system: Secondary | ICD-10-CM | POA: Diagnosis not present

## 2016-04-24 DIAGNOSIS — I619 Nontraumatic intracerebral hemorrhage, unspecified: Secondary | ICD-10-CM | POA: Diagnosis not present

## 2016-04-24 DIAGNOSIS — R270 Ataxia, unspecified: Secondary | ICD-10-CM | POA: Diagnosis not present

## 2016-04-24 NOTE — Therapy (Signed)
[facility] [address] [place], [place], [address] Phone: [phone]   Fax:  [phone]  Physical Therapy Treatment  Patient Details  Name: Richard Alvarado MRN: [id number] Date of Birth: [birth year] Referring Provider: Alysia Penna   Encounter Date: 04/24/2016      PT End of Session - 04/24/16 1521    Visit Number 5   Number of Visits 12   Date for PT Re-Evaluation 04/29/16   Authorization Type Medicare    Authorization Time Period 04/08/16 to 05/20/16   Authorization - Visit Number 5   Authorization - Number of Visits 10   PT Start Time [id number]   PT Stop Time 1600   PT Time Calculation (min) 46 min   Equipment Utilized During Treatment Gait belt   Activity Tolerance Patient tolerated treatment well   Behavior During Therapy [facility] for tasks assessed/performed      Past Medical History:  Diagnosis Date  . Cerebral atherosclerosis    CAROTID DOPPLER, 09/07/2009 - RIGHT AND LEFT CCAs-small-moderate amount of irregular mixed density plaque, no significant evidence of diameter reduction; RIGHT AND LEFT ICAs-moderate amount irregular mixed density plaque, no evidence of significant diameter reduction  . CVA (cerebral vascular accident) ([place]) 1994   Left brain  . History of CVA (cerebrovascular accident) 04/24/2013  . Hypertension    RENAL DOPPLER, 03/08/2009 - normal  . Hypertension in pregnancy, preeclampsia, severe 04/24/2013  . Obstructive sleep apnea 09/26/2005   AHI-19.46, during REM-36.88  . Pacemaker St. Jude victory, dual chamber, 2006 04/24/2013  . Pericardial effusion    2D ECHO, 03/07/2011 - EF >70%, normal  . Second degree heart block 04/24/2013    Past Surgical History:  Procedure Laterality Date  . EP IMPLANTABLE DEVICE N/A 11/21/2014   Procedure: PPM/BIV PPM Generator Changeout;  Surgeon: [name], MD;  Location: [facility];  Service: Cardiovascular;  Laterality: N/A;  . NM MYOVIEW LTD  03/31/2012    Normal study, no ECG changes, EKG negative for ischemia, post-stress EF 54%  . PACEMAKER INSERTION  05/09/2005   St. Jude [place], [place] #5816, serial [phone]    There were no vitals filed for this visit.      Subjective Assessment - 04/24/16 1520    Subjective Pt stated he is feeling good today, feels his functional strengthening is improving though balance continues to be most difficult.  Reports increased feelings down Lt LE   Pertinent History beta-blockers, pacemaker, HTN with history of hypertensive emergency, OSA, DM, ICH of basal ganglia   Patient Stated Goals get stronger, improve balance, get back to PLOF    Currently in Pain? No/denies             [facility] Adult PT Treatment/Exercise - 04/24/16 0001      Lumbar Exercises: Quadruped   Straight Leg Raise 10 reps;3 seconds   Straight Leg Raises Limitations hip extensino and abduction in quadruped     Knee/Hip Exercises: Standing   Heel Raises Both;20 reps   Heel Raises Limitations heel (no/intermittent HHA) and toe (1 finger for balance)   Functional Squat 1 set;10 reps   Functional Squat Limitations at mat table, cues for form    Rocker Board 2 minutes   Rocker Board Limitations lateral and AP no UEs              Balance Exercises - 04/24/16 1542      Balance Exercises: Standing   Tandem Stance Eyes open;Foam/compliant surface  Catch throw  tandem stance on foam   SLS 3 reps;Solid surface  Rt 26" Lt 15" max of 3   SLS with Vectors Solid surface;5 reps  standing on Lt LE intermittent HHA 5x 5"    Rockerboard Anterior/posterior;Lateral  2 min with intermittent HHA   Balance Beam tandem gait on balance beam 1RT            PT Education - 04/23/16 1601    Education provided No          PT Short Term Goals - 04/16/16 1526      PT SHORT TERM GOAL #1   Title Patient to be able to verbally state 5/5 safety concerns/precautions in order to show improved situational awareness and reduce fall risk     Time 3   Period Weeks   Status New     PT SHORT TERM GOAL #2   Title Patient to score at least a 47 on BERG balance test in order to show reduced fall risk and improved safety with mobility    Time 3   Period Weeks   Status New     PT SHORT TERM GOAL #3   Title Patient to be able to ambulate house hold distances with no device, minimal unsteadiness, and good safety awareness in order to show improved general mobiltiy    Time 3   Period Weeks   Status New     PT SHORT TERM GOAL #4   Title Patient to correctly and consistently perform appropriate HEP, to be updated PRN    Time 1   Period Weeks   Status New           PT Long Term Goals - 04/16/16 1526      PT LONG TERM GOAL #1   Title Patient to demonstrate functional strength as being 5/5 in all tested groups in order to improve stability with gait/mobiltiy and improve functional task performance    Time 6   Period Weeks   Status New     PT LONG TERM GOAL #2   Title Patient to score at least a 52 on BERG balance test in order to show improved general mobility and reduced fall risk    Time 6   Period Weeks   Status New     PT LONG TERM GOAL #3   Title Patient to be able to ambulate unlimited distances with no assistive device, minimal unsteadiness, and no increased fall risk in order to improve general mobiltiy and facilitate return to PLOF    Time 6   Period Weeks   Status New     PT LONG TERM GOAL #4   Title Patient to be able to ambulate outside over grass/gravel/etc with no assistive device and minimal unsteadiness/fall risk in order to facilitate return to PLOF    Time 6   Period Weeks   Status New     PT LONG TERM GOAL #5   Title Patient to be participatory in appropriate regular aerobic exercise program, at least 30 minutes in duration and at least 4 days per week, in order to maintain functional gains and improve general health status    Time 6   Period Weeks   Status New               Plan -  04/24/16 1752    Clinical Impression Statement Continued session focus on functional strenghtenign and higher level balance training.  Able to progress all CKC exercises with increased reps  and reduce HHA to emphasze balance during strength training exercises.  Pt does continues to demonstrate difficulty with proprioception and coordination with Lt ankle.  Added vector stance and dynamic surface balance activities to improve ankle proprioception.  EOS no reports of pain, was limited by fatigue with activity.     Rehab Potential Excellent   Clinical Impairments Affecting Rehab Potential highly motivated to participate in skilled PT services    PT Frequency 2x / week   PT Duration 6 weeks   PT Next Visit Plan CKC activities and exercise however continue to focus on hip extensor/abd strength on mat table; continue focus on balance activities   PT Home Exercise Plan 04/08/16: sidelying clams with green TB, sit to stand with equal weight bearing, tandem stance at counter      Patient will benefit from skilled therapeutic intervention in order to improve the following deficits and impairments:  Abnormal gait, Improper body mechanics, Decreased coordination, Decreased mobility, Postural dysfunction, Decreased strength, Decreased balance, Difficulty walking, Decreased safety awareness  Visit Diagnosis: Unsteadiness on feet  Difficulty in walking, not elsewhere classified  Muscle weakness (generalized)  Other symptoms and signs involving the musculoskeletal system     Problem List Patient Active Problem List   Diagnosis Date Noted  . Gait disturbance, post-stroke 04/11/2016  . DM (diabetes mellitus), type 2 with neurological complications ([place])   . Essential hypertension   . Family history of stroke 03/24/2016  . OSA (obstructive sleep apnea) 03/24/2016  . Hypokalemia 03/24/2016  . Nontraumatic acute hemorrhage of basal ganglia ([place]) 03/24/2016  . Ataxia   . ICH (intracerebral hemorrhage)  ([place]) - R basal ganglia 03/22/2016  . Hypertensive emergency   . Diabetes mellitus type 2 in nonobese ([place]) 03/19/2016  . Pacemaker battery depletion 11/14/2014  . Hyperlipidemia 11/01/2013  . Severe hypertension 04/24/2013  . Pericardial effusion secondary to minoxidil 04/24/2013  . History of CVA (cerebrovascular accident) 04/24/2013  . Second degree heart block 04/24/2013  . Pacemaker St. Jude victory, dual chamber, 2006 04/24/2013  . Cervicalgia 01/01/2011  . Muscle weakness (generalized) 01/01/2011   Ihor Austin, LPTA; [place]  Aldona Lento 04/24/2016, 6:01 PM  [place], [place], [address] Phone: [phone]   Fax:  [phone]  Name: CALISTRO OELKE MRN: [id number] Date of Birth: 1940/10/15

## 2016-04-29 ENCOUNTER — Ambulatory Visit (HOSPITAL_COMMUNITY): Payer: Medicare Other | Admitting: Physical Therapy

## 2016-04-29 ENCOUNTER — Other Ambulatory Visit: Payer: Self-pay | Admitting: Cardiovascular Disease

## 2016-04-29 DIAGNOSIS — M6281 Muscle weakness (generalized): Secondary | ICD-10-CM | POA: Diagnosis not present

## 2016-04-29 DIAGNOSIS — R262 Difficulty in walking, not elsewhere classified: Secondary | ICD-10-CM | POA: Diagnosis not present

## 2016-04-29 DIAGNOSIS — R29898 Other symptoms and signs involving the musculoskeletal system: Secondary | ICD-10-CM | POA: Diagnosis not present

## 2016-04-29 DIAGNOSIS — R270 Ataxia, unspecified: Secondary | ICD-10-CM | POA: Diagnosis not present

## 2016-04-29 DIAGNOSIS — R2681 Unsteadiness on feet: Secondary | ICD-10-CM

## 2016-04-29 DIAGNOSIS — I619 Nontraumatic intracerebral hemorrhage, unspecified: Secondary | ICD-10-CM | POA: Diagnosis not present

## 2016-04-29 NOTE — Therapy (Signed)
[place], [place], [address] Phone: [phone]   Fax:  [phone]  Physical Therapy Treatment  Patient Details  Name: Richard Alvarado MRN: [id number] Date of Birth: 04/20/1941 Referring Provider: Alysia Penna   Encounter Date: 04/29/2016      PT End of Session - 04/29/16 1701    Visit Number 6   Number of Visits 12   Date for PT Re-Evaluation 05/20/16   Authorization Type Medicare (G-codes done 6th session)   Authorization Time Period 04/08/16 to 05/20/16   Authorization - Visit Number 6   Authorization - Number of Visits 16   PT Start Time 7408   PT Stop Time 1557   PT Time Calculation (min) 39 min   Equipment Utilized During Treatment Gait belt   Activity Tolerance Patient tolerated treatment well   Behavior During Therapy [facility] for tasks assessed/performed      Past Medical History:  Diagnosis Date  . Cerebral atherosclerosis    CAROTID DOPPLER, 09/07/2009 - RIGHT AND LEFT CCAs-small-moderate amount of irregular mixed density plaque, no significant evidence of diameter reduction; RIGHT AND LEFT ICAs-moderate amount irregular mixed density plaque, no evidence of significant diameter reduction  . CVA (cerebral vascular accident) ([place]) 1994   Left brain  . History of CVA (cerebrovascular accident) 04/24/2013  . Hypertension    RENAL DOPPLER, 03/08/2009 - normal  . Hypertension in pregnancy, preeclampsia, severe 04/24/2013  . Obstructive sleep apnea 09/26/2005   AHI-19.46, during REM-36.88  . Pacemaker St. Jude victory, dual chamber, 2006 04/24/2013  . Pericardial effusion    2D ECHO, 03/07/2011 - EF >70%, normal  . Second degree heart block 04/24/2013    Past Surgical History:  Procedure Laterality Date  . EP IMPLANTABLE DEVICE N/A 11/21/2014   Procedure: PPM/BIV PPM Generator Changeout;  Surgeon: [name], MD;  Location: [facility];  Service: Cardiovascular;  Laterality: N/A;  .  NM MYOVIEW LTD  03/31/2012   Normal study, no ECG changes, EKG negative for ischemia, post-stress EF 54%  . PACEMAKER INSERTION  05/09/2005   St. Jude [place], [place] #5816, serial [phone]    There were no vitals filed for this visit.      Subjective Assessment - 04/29/16 1521    Subjective Patient arrives stating that he is feeling very good; his mobility in general is getting much better, he is wakling without his cane more but turning fast is still hard as he loses his balance. Besides losing his balance when he turns quickly, he cannot think of anything that is difficult. He rates himself as being 80/100 with his last remaining concerns being balance related.    Pertinent History beta-blockers, pacemaker, HTN with history of hypertensive emergency, OSA, DM, ICH of basal ganglia   How long can you stand comfortably? 11/21- 15 minutes    How long can you walk comfortably? 11/21- a couple hours    Patient Stated Goals get stronger, improve balance, get back to PLOF    Currently in Pain? No/denies            [facility] PT Assessment - 04/29/16 0001      Strength   Right Hip Flexion 5/5   Right Hip Extension 3+/5   Right Hip ABduction 4/5   Left Hip Flexion 4+/5   Left Hip Extension 3/5   Left Hip ABduction 4-/5   Right Knee Flexion 5/5   Right Knee Extension 5/5   Left Knee Flexion 4+/5  Left Knee Extension 5/5   Right Ankle Dorsiflexion 5/5   Left Ankle Dorsiflexion 5/5     6 minute walk test results    Aerobic Endurance Distance Walked 603   Endurance additional comments 3MWT      Berg Balance Test   Sit to Stand Able to stand without using hands and stabilize independently   Standing Unsupported Able to stand safely 2 minutes   Sitting with Back Unsupported but Feet Supported on Floor or Stool Able to sit safely and securely 2 minutes   Stand to Sit Sits safely with minimal use of hands   Transfers Able to transfer safely, minor use of hands   Standing Unsupported with  Eyes Closed Able to stand 10 seconds safely   Standing Ubsupported with Feet Together Able to place feet together independently and stand for 1 minute with supervision   From Standing, Reach Forward with Outstretched Arm Can reach forward >5 cm safely (2")   From Standing Position, Pick up Object from [place] to pick up shoe safely and easily   From Standing Position, Turn to Look Behind Over each Shoulder Looks behind one side only/other side shows less weight shift   Turn 360 Degrees Able to turn 360 degrees safely but slowly   Standing Unsupported, Alternately Place Feet on Step/Stool Able to stand independently and safely and complete 8 steps in 20 seconds   Standing Unsupported, One Foot in Front Able to place foot tandem independently and hold 30 seconds   Standing on One Leg Able to lift leg independently and hold equal to or more than 3 seconds   Total Score 48                          Balance Exercises - 04/29/16 1700      Balance Exercises: Standing   Tandem Stance 3 reps;15 secs;Foam/compliant surface   Rockerboard Anterior/posterior;Lateral;Other (comment)  2 minutes            PT Education - 04/29/16 1701    Education provided Yes   Education Details progress, POC, neurological recovery following stroke/neuro insult    Person(s) Educated Patient   Methods Explanation   Comprehension Verbalized understanding          PT Short Term Goals - 04/29/16 1541      PT SHORT TERM GOAL #1   Title Patient to be able to verbally state 5/5 safety concerns/precautions in order to show improved situational awareness and reduce fall risk    Baseline 11/21- 2/5 on his own, 5/5 with some cues from PT    Time 3   Period Weeks   Status Partially Met     PT SHORT TERM GOAL #2   Title Patient to score at least a 47 on BERG balance test in order to show reduced fall risk and improved safety with mobility    Baseline 11/21- 48    Time 3   Period Weeks    Status Achieved     PT SHORT TERM GOAL #3   Title Patient to be able to ambulate house hold distances with no device, minimal unsteadiness, and good safety awareness in order to show improved general mobiltiy    Baseline 11/21- doing very well, uses cane mostly outside    Time 3   Period Weeks   Status Achieved     PT SHORT TERM GOAL #4   Title Patient to correctly and consistently perform appropriate  HEP, to be updated PRN    Baseline 11/21- compliant    Time 1   Period Weeks   Status Achieved           PT Long Term Goals - 04/29/16 1544      PT LONG TERM GOAL #1   Title Patient to demonstrate functional strength as being 5/5 in all tested groups in order to improve stability with gait/mobiltiy and improve functional task performance    Time 6   Period Weeks   Status On-going     PT LONG TERM GOAL #2   Title Patient to score at least a 52 on BERG balance test in order to show improved general mobility and reduced fall risk    Time 6   Period Weeks   Status On-going     PT LONG TERM GOAL #3   Title Patient to be able to ambulate unlimited distances with no assistive device, minimal unsteadiness, and no increased fall risk in order to improve general mobiltiy and facilitate return to PLOF    Baseline 11/21- he is working on getting away from the cane, he is doing well overall    Time 6   Period Weeks   Status Partially Met     PT LONG TERM GOAL #4   Title Patient to be able to ambulate outside over grass/gravel/etc with no assistive device and minimal unsteadiness/fall risk in order to facilitate return to PLOF    Time 6   Period Weeks   Status On-going     PT LONG TERM GOAL #5   Title Patient to be participatory in appropriate regular aerobic exercise program, at least 30 minutes in duration and at least 4 days per week, in order to maintain functional gains and improve general health status    Time 6   Period Weeks   Status On-going               Plan -  04/29/16 1702    Clinical Impression Statement Re-assessment performed today. Patient appears to be progressing very well on all planes, with objective improvements noted in functional strength, balance skills, and in gait mechanics; patient seems very pleased with his progress thus far and states that his main remaining concern is his balance. Continue to note some ankle instability/weakness as evidenced by tendency for inversion/supination but his proprioception is improving as he is starting to notice and correct this more on foam. Recommend extension of skilled PT services with focus primarily on proximal strength and high level balance activities before DC to independent program/advanced HEP.    Rehab Potential Excellent   Clinical Impairments Affecting Rehab Potential highly motivated to participate in skilled PT services    PT Frequency 2x / week   PT Duration 3 weeks   PT Treatment/Interventions ADLs/Self Care Home Management;Biofeedback;Cryotherapy;Moist Heat;DME Instruction;Gait training;Stair training;Functional mobility training;Therapeutic activities;Therapeutic exercise;Balance training;Neuromuscular re-education;Patient/family education;Manual techniques;Energy conservation;Taping   PT Next Visit Plan CKC activities and exercise however continue to focus on hip extensor/abd strength on mat table; continue focus on balance activities. Update HEP.    PT Home Exercise Plan 04/08/16: sidelying clams with green TB, sit to stand with equal weight bearing, tandem stance at counter   Consulted and Agree with Plan of Care Patient      Patient will benefit from skilled therapeutic intervention in order to improve the following deficits and impairments:  Abnormal gait, Improper body mechanics, Decreased coordination, Decreased mobility, Postural dysfunction, Decreased strength, Decreased balance, Difficulty walking,  Decreased safety awareness  Visit Diagnosis: Unsteadiness on feet  Difficulty  in walking, not elsewhere classified  Muscle weakness (generalized)       G-Codes - 05-25-16 1703    Functional Assessment Tool Used Based on skilled clinical assessment of balance, strength, gait    Functional Limitation Mobility: Walking and moving around   Mobility: Walking and Moving Around Current Status (U9323) At least 20 percent but less than 40 percent impaired, limited or restricted   Mobility: Walking and Moving Around Goal Status [phone]) At least 1 percent but less than 20 percent impaired, limited or restricted      Problem List Patient Active Problem List   Diagnosis Date Noted  . Gait disturbance, post-stroke 04/11/2016  . DM (diabetes mellitus), type 2 with neurological complications ([place])   . Essential hypertension   . Family history of stroke 03/24/2016  . OSA (obstructive sleep apnea) 03/24/2016  . Hypokalemia 03/24/2016  . Nontraumatic acute hemorrhage of basal ganglia ([place]) 03/24/2016  . Ataxia   . ICH (intracerebral hemorrhage) ([place]) - R basal ganglia 03/22/2016  . Hypertensive emergency   . Diabetes mellitus type 2 in nonobese ([place]) 03/19/2016  . Pacemaker battery depletion 11/14/2014  . Hyperlipidemia 11/01/2013  . Severe hypertension 04/24/2013  . Pericardial effusion secondary to minoxidil 04/24/2013  . History of CVA (cerebrovascular accident) 04/24/2013  . Second degree heart block 04/24/2013  . Pacemaker St. Jude victory, dual chamber, 2006 04/24/2013  . Cervicalgia 01/01/2011  . Muscle weakness (generalized) 01/01/2011    Deniece Ree PT, DPT [facility] [address] [place] [facility] [place], [place], [address] Phone: [phone]   Fax:  [phone]  Name: Richard Alvarado MRN: [id number] Date of Birth: 26-Aug-1940

## 2016-05-06 ENCOUNTER — Ambulatory Visit (HOSPITAL_COMMUNITY): Payer: Medicare Other | Admitting: Physical Therapy

## 2016-05-06 DIAGNOSIS — M6281 Muscle weakness (generalized): Secondary | ICD-10-CM | POA: Diagnosis not present

## 2016-05-06 DIAGNOSIS — I619 Nontraumatic intracerebral hemorrhage, unspecified: Secondary | ICD-10-CM | POA: Diagnosis not present

## 2016-05-06 DIAGNOSIS — R262 Difficulty in walking, not elsewhere classified: Secondary | ICD-10-CM

## 2016-05-06 DIAGNOSIS — R2681 Unsteadiness on feet: Secondary | ICD-10-CM | POA: Diagnosis not present

## 2016-05-06 DIAGNOSIS — R270 Ataxia, unspecified: Secondary | ICD-10-CM | POA: Diagnosis not present

## 2016-05-06 DIAGNOSIS — R29898 Other symptoms and signs involving the musculoskeletal system: Secondary | ICD-10-CM

## 2016-05-06 NOTE — Therapy (Signed)
[place] Alvar[place], [place], [address] Phone: [phone]   Fax:  [phone]  Physical Therapy Treatment  Patient Details  Name: Richard Alvarado MRN: [id number] Date of Birth: 1940-06-24 Referring Provider: Alysia Penna   Encounter Date: 05/06/2016      PT End of Session - 05/06/16 1824    Visit Number 7   Number of Visits 12   Date for PT Re-Evaluation 05/20/16   Authorization Type Medicare (G-codes done 6th session)   Authorization Time Period 04/08/16 to 05/20/16   Authorization - Visit Number 7   Authorization - Number of Visits 16   PT Start Time 8502   PT Stop Time 7741   PT Time Calculation (min) 41 min   Activity Tolerance Patient tolerated treatment well   Behavior During Therapy [facility] for tasks assessed/performed      Past Medical History:  Diagnosis Date  . Cerebral atherosclerosis    CAROTID DOPPLER, 09/07/2009 - RIGHT AND LEFT CCAs-small-moderate amount of irregular mixed density plaque, no significant evidence of diameter reduction; RIGHT AND LEFT ICAs-moderate amount irregular mixed density plaque, no evidence of significant diameter reduction  . CVA (cerebral vascular accident) ([place]) 1994   Left brain  . History of CVA (cerebrovascular accident) 04/24/2013  . Hypertension    RENAL DOPPLER, 03/08/2009 - normal  . Hypertension in pregnancy, preeclampsia, severe 04/24/2013  . Obstructive sleep apnea 09/26/2005   AHI-19.46, during REM-36.88  . Pacemaker St. Jude victory, dual chamber, 2006 04/24/2013  . Pericardial effusion    2D ECHO, 03/07/2011 - EF >70%, normal  . Second degree heart block 04/24/2013    Past Surgical History:  Procedure Laterality Date  . EP IMPLANTABLE DEVICE N/A 11/21/2014   Procedure: PPM/BIV PPM Generator Changeout;  Surgeon: [name], MD;  Location: [facility];  Service: Cardiovascular;  Laterality: N/A;  . NM MYOVIEW LTD  03/31/2012   Normal study, no ECG  changes, EKG negative for ischemia, post-stress EF 54%  . PACEMAKER INSERTION  05/09/2005   St. Jude [place], [place] #5816, serial [phone]    There were no vitals filed for this visit.      Subjective Assessment - 05/06/16 1435    Subjective Patient arrives today stating no major changes since last session; he has started riding his bike more at home, and is doing about 15 minutes, 4-5 times per day.    Pertinent History beta-blockers, pacemaker, HTN with history of hypertensive emergency, OSA, DM, ICH of basal ganglia   Currently in Pain? No/denies                         [facility] Adult PT Treatment/Exercise - 05/06/16 0001      Knee/Hip Exercises: Standing   Heel Raises Both;20 reps   Heel Raises Limitations heel (no/intermittent HHA) and toe (1 finger for balance)   Lateral Step Up Both;1 set;10 reps   Lateral Step Up Limitations 8 inch box    Forward Step Up Both;1 set;10 reps   Forward Step Up Limitations 8 inch box    Step Down Both;1 set;10 reps   Step Down Limitations 4 inch box    Functional Squat 1 set;15 reps   Functional Squat Limitations ion front of chair              Balance Exercises - 05/06/16 1823      Balance Exercises: Standing   Rockerboard Anterior/posterior;Lateral;Other (comment)   Tandem  Gait Forward;Retro;4 reps;Other reps (comment);Other (comment)  forward and backward in // bars    Other Standing Exercises navigation of obstacle course invollving cones and hurdles, outside ambulation over grass and gravel            PT Education - 05/06/16 1824    Education provided Yes   Education Details HEP update    Person(s) Educated Patient   Methods Explanation;Handout   Comprehension Verbalized understanding          PT Short Term Goals - 04/29/16 1541      PT SHORT TERM GOAL #1   Title Patient to be able to verbally state 5/5 safety concerns/precautions in order to show improved situational awareness and reduce fall risk     Baseline 11/21- 2/5 on his own, 5/5 with some cues from PT    Time 3   Period Weeks   Status Partially Met     PT SHORT TERM GOAL #2   Title Patient to score at least a 47 on BERG balance test in order to show reduced fall risk and improved safety with mobility    Baseline 11/21- 48    Time 3   Period Weeks   Status Achieved     PT SHORT TERM GOAL #3   Title Patient to be able to ambulate house hold distances with no device, minimal unsteadiness, and good safety awareness in order to show improved general mobiltiy    Baseline 11/21- doing very well, uses cane mostly outside    Time 3   Period Weeks   Status Achieved     PT SHORT TERM GOAL #4   Title Patient to correctly and consistently perform appropriate HEP, to be updated PRN    Baseline 11/21- compliant    Time 1   Period Weeks   Status Achieved           PT Long Term Goals - 04/29/16 1544      PT LONG TERM GOAL #1   Title Patient to demonstrate functional strength as being 5/5 in all tested groups in order to improve stability with gait/mobiltiy and improve functional task performance    Time 6   Period Weeks   Status On-going     PT LONG TERM GOAL #2   Title Patient to score at least a 52 on BERG balance test in order to show improved general mobility and reduced fall risk    Time 6   Period Weeks   Status On-going     PT LONG TERM GOAL #3   Title Patient to be able to ambulate unlimited distances with no assistive device, minimal unsteadiness, and no increased fall risk in order to improve general mobiltiy and facilitate return to PLOF    Baseline 11/21- he is working on getting away from the cane, he is doing well overall    Time 6   Period Weeks   Status Partially Met     PT LONG TERM GOAL #4   Title Patient to be able to ambulate outside over grass/gravel/etc with no assistive device and minimal unsteadiness/fall risk in order to facilitate return to PLOF    Time 6   Period Weeks   Status On-going      PT LONG TERM GOAL #5   Title Patient to be participatory in appropriate regular aerobic exercise program, at least 30 minutes in duration and at least 4 days per week, in order to maintain functional gains and improve general health status  Time 6   Period Weeks   Status On-going               Plan - 05/06/16 1825    Clinical Impression Statement Continued with functional strengthening today, with progression of exercises and difficulty as appropriate this session; also continued focus on proximal strengthening of hip extensors and abductors to address ongoing strength limitations in these areas. Finished session with work on advanced balance exercises in and out of parallel bars for dynamic coordination and reduced fall risk.    Rehab Potential Excellent   Clinical Impairments Affecting Rehab Potential highly motivated to participate in skilled PT services    PT Frequency 2x / week   PT Duration 3 weeks   PT Treatment/Interventions ADLs/Self Care Home Management;Biofeedback;Cryotherapy;Moist Heat;DME Instruction;Gait training;Stair training;Functional mobility training;Therapeutic activities;Therapeutic exercise;Balance training;Neuromuscular re-education;Patient/family education;Manual techniques;Energy conservation;Taping   PT Next Visit Plan CKC activities and exercise however continue to focus on hip extensor/abd strength on mat table; continue focus on balance activities.    PT Home Exercise Plan 04/08/16: sidelying clams with green TB, sit to stand with equal weight bearing, tandem stance at counter   Consulted and Agree with Plan of Care Patient      Patient will benefit from skilled therapeutic intervention in order to improve the following deficits and impairments:  Abnormal gait, Improper body mechanics, Decreased coordination, Decreased mobility, Postural dysfunction, Decreased strength, Decreased balance, Difficulty walking, Decreased safety awareness  Visit  Diagnosis: Unsteadiness on feet  Difficulty in walking, not elsewhere classified  Muscle weakness (generalized)  Other symptoms and signs involving the musculoskeletal system     Problem List Patient Active Problem List   Diagnosis Date Noted  . Gait disturbance, post-stroke 04/11/2016  . DM (diabetes mellitus), type 2 with neurological complications ([place])   . Essential hypertension   . Family history of stroke 03/24/2016  . OSA (obstructive sleep apnea) 03/24/2016  . Hypokalemia 03/24/2016  . Nontraumatic acute hemorrhage of basal ganglia ([place]) 03/24/2016  . Ataxia   . ICH (intracerebral hemorrhage) ([place]) - R basal ganglia 03/22/2016  . Hypertensive emergency   . Diabetes mellitus type 2 in nonobese ([place]) 03/19/2016  . Pacemaker battery depletion 11/14/2014  . Hyperlipidemia 11/01/2013  . Severe hypertension 04/24/2013  . Pericardial effusion secondary to minoxidil 04/24/2013  . History of CVA (cerebrovascular accident) 04/24/2013  . Second degree heart block 04/24/2013  . Pacemaker St. Jude victory, dual chamber, 2006 04/24/2013  . Cervicalgia 01/01/2011  . Muscle weakness (generalized) 01/01/2011    Deniece Ree PT, DPT [place] [address] [place], [place], [address] Phone: [phone]   Fax:  [phone]  Name: Richard Alvarado MRN: [id number] Date of Birth: 26-Feb-1941

## 2016-05-06 NOTE — Patient Instructions (Signed)
   SQUAT - SUPPORTED WITH CHAIR FOR SAFETY  Place a chair behind you, against the wall,  for safety.   While standing with feet shoulder width apart and in front of a stable support for balance assist if needed, bend your knees and lower your body towards the floor. Your body weight should mostly be directed through the heels of your feet. Return to a standing position.   Knees should bend in line with the 2nd toe and not pass the front of the foot.  Repeat 15 times, twice a day.   TANDEM WALK  Take steps so that your heel strikes the ground and is touching the toes of the other foot.  Continue taking steps as you walk forward.   Maintain your balance.   Do this in a hallway for safety. 5 laps, twice a day.    SINGLE LEG STANCE - SLS  Stand on one leg and maintain your balance.  Hold as long as you can, then switch sides. Repeat 3 times each side, twice a day.

## 2016-05-08 ENCOUNTER — Encounter: Payer: Self-pay | Admitting: Physical Medicine & Rehabilitation

## 2016-05-08 ENCOUNTER — Ambulatory Visit (HOSPITAL_COMMUNITY): Payer: Medicare Other | Admitting: Physical Therapy

## 2016-05-08 ENCOUNTER — Ambulatory Visit (HOSPITAL_BASED_OUTPATIENT_CLINIC_OR_DEPARTMENT_OTHER): Payer: Medicare Other | Admitting: Physical Medicine & Rehabilitation

## 2016-05-08 VITALS — BP 179/80 | HR 61

## 2016-05-08 DIAGNOSIS — R2681 Unsteadiness on feet: Secondary | ICD-10-CM | POA: Diagnosis not present

## 2016-05-08 DIAGNOSIS — R269 Unspecified abnormalities of gait and mobility: Secondary | ICD-10-CM | POA: Diagnosis not present

## 2016-05-08 DIAGNOSIS — M6281 Muscle weakness (generalized): Secondary | ICD-10-CM | POA: Diagnosis not present

## 2016-05-08 DIAGNOSIS — I69351 Hemiplegia and hemiparesis following cerebral infarction affecting right dominant side: Secondary | ICD-10-CM | POA: Diagnosis not present

## 2016-05-08 DIAGNOSIS — R29898 Other symptoms and signs involving the musculoskeletal system: Secondary | ICD-10-CM | POA: Diagnosis not present

## 2016-05-08 DIAGNOSIS — I610 Nontraumatic intracerebral hemorrhage in hemisphere, subcortical: Secondary | ICD-10-CM | POA: Diagnosis not present

## 2016-05-08 DIAGNOSIS — R262 Difficulty in walking, not elsewhere classified: Secondary | ICD-10-CM | POA: Diagnosis not present

## 2016-05-08 DIAGNOSIS — R270 Ataxia, unspecified: Secondary | ICD-10-CM | POA: Diagnosis not present

## 2016-05-08 DIAGNOSIS — I10 Essential (primary) hypertension: Secondary | ICD-10-CM | POA: Diagnosis not present

## 2016-05-08 DIAGNOSIS — I619 Nontraumatic intracerebral hemorrhage, unspecified: Secondary | ICD-10-CM | POA: Diagnosis not present

## 2016-05-08 DIAGNOSIS — I69398 Other sequelae of cerebral infarction: Secondary | ICD-10-CM | POA: Diagnosis not present

## 2016-05-08 DIAGNOSIS — I672 Cerebral atherosclerosis: Secondary | ICD-10-CM | POA: Diagnosis not present

## 2016-05-08 NOTE — Patient Instructions (Signed)
   HIP ABDUCTION SIDELYING  2  While lying on your side and upper most leg on 2-3 pillows, slowly raise up the top leg to the side. Keep your knee straight and maintain your toes pointed forward the entire time. Keep your leg in-line with your body.  The bottom leg can be bent to stabilize your body. Make sure your hips are not rolling forwards or backwards- they should stay stable and stacked.   Repeat 15 times each leg, twice a day.     PRONE HIP EXTENSION  While lying face down with your knee straight, slowly raise up leg off the ground.  You should feel the muscles working in your butt, not your back.  Repeat 10-15 times each side, twice a day.

## 2016-05-08 NOTE — Progress Notes (Signed)
Subjective:    Patient ID: Richard Alvarado, male    DOB: 06/18/1940, 75 y.o.   MRN: [id number]  HPI 75 year old male with prior CVA 2014 admitted 03/21/2016 with lower extremity weakness. CT demonstrated small acute right basal ganglia hemorrhage. He is felt to be hypertensive  outpatient PT, OT. Last visit 05/06/2016. Using stationary bicycling as home exercise 26min 3-4 times a day Lower ext strength improving  No pains No falls Uses cane outside the house Dressing and bathing Mod I Washes dishes, takes out garbage No arm weakness No vision issues   Pain Inventory Average Pain 0 Pain Right Now 0 My pain is no pain  In the last 24 hours, has pain interfered with the following? General activity 0 Relation with others 0 Enjoyment of life 0 What TIME of day is your pain at its worst? no pain Sleep (in general) Good  Pain is worse with: no pain Pain improves with: no pain Relief from Meds: no pain  Mobility walk with assistance use a cane ability to climb steps?  yes  Function disabled: date disabled 35  Neuro/Psych No problems in this area  Prior Studies Any changes since last visit?  no  Physicians involved in your care Any changes since last visit?  no   Family History  Problem Relation Age of Onset  . CVA Mother   . Hypertension Sister   . Heart disease Maternal Grandmother    Social History   Social History  . Marital status: Married    Spouse name: N/A  . Number of children: N/A  . Years of education: N/A   Social History Main Topics  . Smoking status: Former Smoker    Quit date: 06/09/1999  . Smokeless tobacco: Never Used  . Alcohol use No  . Drug use: No  . Sexual activity: Not Asked   Other Topics Concern  . None   Social History Narrative  . None   Past Surgical History:  Procedure Laterality Date  . EP IMPLANTABLE DEVICE N/A 11/21/2014   Procedure: PPM/BIV PPM Generator Changeout;  Surgeon: [name], MD;   Location: [facility];  Service: Cardiovascular;  Laterality: N/A;  . NM MYOVIEW LTD  03/31/2012   Normal study, no ECG changes, EKG negative for ischemia, post-stress EF 54%  . PACEMAKER INSERTION  05/09/2005   St. Jude [place], [place] #[id number], serial #[id number]   Past Medical History:  Diagnosis Date  . Cerebral atherosclerosis    CAROTID DOPPLER, 09/07/2009 - RIGHT AND LEFT CCAs-small-moderate amount of irregular mixed density plaque, no significant evidence of diameter reduction; RIGHT AND LEFT ICAs-moderate amount irregular mixed density plaque, no evidence of significant diameter reduction  . CVA (cerebral vascular accident) ([place]) 1994   Left brain  . History of CVA (cerebrovascular accident) 04/24/2013  . Hypertension    RENAL DOPPLER, 03/08/2009 - normal  . Hypertension in pregnancy, preeclampsia, severe 04/24/2013  . Obstructive sleep apnea 09/26/2005   AHI-19.46, during REM-36.88  . Pacemaker St. Jude victory, dual chamber, 2006 04/24/2013  . Pericardial effusion    2D ECHO, 03/07/2011 - EF >70%, normal  . Second degree heart block 04/24/2013   BP (!) 179/95 (BP Location: Left Arm, Patient Position: Sitting, Cuff Size: Normal)   Pulse 61   SpO2 96%   Opioid Risk Score:   Fall Risk Score:  `1  Depression screen PHQ 2/9  Depression screen PHQ 2/9 04/11/2016  Decreased Interest 0  Down, Depressed, Hopeless 0  PHQ -  2 Score 0  Altered sleeping 0  Tired, decreased energy 1  Change in appetite 0  Feeling bad or failure about yourself  0  Trouble concentrating 0  Moving slowly or fidgety/restless 0  Suicidal thoughts 0  PHQ-9 Score 1  Difficult doing work/chores Not difficult at all    Review of Systems  Constitutional: Negative.   HENT: Negative.   Eyes: Negative.   Respiratory: Negative.   Cardiovascular: Negative.   Gastrointestinal: Negative.   Endocrine:       Diabetic  Genitourinary: Negative.   Musculoskeletal: Negative.   Skin: Negative.     Allergic/Immunologic: Negative.   Neurological: Negative.   Hematological: Negative.   Psychiatric/Behavioral: Negative.   All other systems reviewed and are negative.      Objective:   Physical Exam Gen. no acute distress Neuro:  Eyes without evidence of nystagmus  Tone is normal without evidence of spasticity Cerebellar exam shows no evidence of ataxia on finger nose finger or heel to shin testing No evidence of trunkal ataxia  Motor strength is 5/5 in bilateral deltoid, biceps, triceps, finger flexors and extensors, wrist flexors and extensors, hip flexors, knee flexors and extensors, ankle dorsiflexors, plantar flexors, invertors and evertors, toe flexors and extensors  Sensory exam is normal to pinprick, proprioception and light touch in the upper and lower limbs   Cranial nerves II- Visual fields are intact to confrontation testing, no blurring of vision III- no evidence of ptosis, upward, downward and medial gaze intact IV- no vertical diplopia or head tilt V- no facial numbness or masseter weakness VI- no pupil abduction weakness VII- no facial droop, good lid closure VII- normal auditory acuity IX- no pharygeal weakness,  X- no pharyngeal weakness, no hoarseness XI- no trap or SCM weakness XII- no glossal weakness  Gait without evidence of toe drag or knee instability       Assessment & Plan:  1. History of small right basal ganglia infarct. Minimal residual deficit, mild balance deficit, left lower extremity weakness has improved, but may experience some fatigue with this during more prolonged ambulation.  Graduated return to driving instructions were provided. It is recommended that the patient first drives with another licensed driver in an empty parking lot. If the patient does well with this, and they can drive on a quiet street with the licensed driver. If the patient does well with this they can drive on a busy street with a licensed driver. If the patient  does well with this, the next time out they can go by himself. For the first month after resuming driving, I recommend no nighttime or Interstate driving  Physical medicine and rehabilitation clinic follow-up on as-needed basis Follow up with primary care Follow up with neurology.

## 2016-05-08 NOTE — Patient Instructions (Signed)

## 2016-05-08 NOTE — Therapy (Signed)
[facility] [place], [place], [address] Phone: [phone]   Fax:  [phone]  Physical Therapy Treatment  Patient Details  Name: Richard Alvarado MRN: [id number] Date of Birth: [birth year] Referring Provider: Alysia Penna   Encounter Date: 05/08/2016      PT End of Session - 05/08/16 1511    Visit Number 8   Number of Visits 12   Date for PT Re-Evaluation 05/20/16   Authorization Type Medicare (G-codes done 6th session)   Authorization Time Period 04/08/16 to 05/20/16   Authorization - Visit Number 8   Authorization - Number of Visits 16   PT Start Time 5573   PT Stop Time 1511   PT Time Calculation (min) 38 min   Activity Tolerance Patient tolerated treatment well   Behavior During Therapy [facility] for tasks assessed/performed      Past Medical History:  Diagnosis Date  . Cerebral atherosclerosis    CAROTID DOPPLER, 09/07/2009 - RIGHT AND LEFT CCAs-small-moderate amount of irregular mixed density plaque, no significant evidence of diameter reduction; RIGHT AND LEFT ICAs-moderate amount irregular mixed density plaque, no evidence of significant diameter reduction  . CVA (cerebral vascular accident) ([place]) 1994   Left brain  . History of CVA (cerebrovascular accident) 04/24/2013  . Hypertension    RENAL DOPPLER, 03/08/2009 - normal  . Hypertension in pregnancy, preeclampsia, severe 04/24/2013  . Obstructive sleep apnea 09/26/2005   AHI-19.46, during REM-36.88  . Pacemaker St. Jude victory, dual chamber, 2006 04/24/2013  . Pericardial effusion    2D ECHO, 03/07/2011 - EF >70%, normal  . Second degree heart block 04/24/2013    Past Surgical History:  Procedure Laterality Date  . EP IMPLANTABLE DEVICE N/A 11/21/2014   Procedure: PPM/BIV PPM Generator Changeout;  Surgeon: [name], MD;  Location: [facility];  Service: Cardiovascular;  Laterality: N/A;  . NM MYOVIEW LTD  03/31/2012   Normal study, no ECG  changes, EKG negative for ischemia, post-stress EF 54%  . PACEMAKER INSERTION  05/09/2005   St. Jude [place], [place] #[id number], serial [phone]    There were no vitals filed for this visit.      Subjective Assessment - 05/08/16 1435    Subjective Patient arrives today stating that he is doing well, no major changes and his MD is happy with his progress/released him from his care    Pertinent History beta-blockers, pacemaker, HTN with history of hypertensive emergency, OSA, DM, ICH of basal ganglia   Patient Stated Goals get stronger, improve balance, get back to PLOF    Currently in Pain? No/denies                         [facility] Adult PT Treatment/Exercise - 05/08/16 0001      Lumbar Exercises: Supine   Bridge 15 reps   Bridge Limitations staggered stance    Other Supine Lumbar Exercises clams green TB 1x15     Lumbar Exercises: Sidelying   Clam 15 reps   Clam Limitations green TB    Hip Abduction 10 reps     Lumbar Exercises: Prone   Straight Leg Raise 10 reps     Knee/Hip Exercises: Standing   Heel Raises Both;1 set;15 reps   Heel Raises Limitations U heel and toe    Forward Step Up Both;1 set;10 reps   Forward Step Up Limitations 12 inch box  Balance Exercises - 05/08/16 1452      Balance Exercises: Standing   Tandem Stance Eyes open;3 reps;Other (comment)  head turns    SLS Eyes open;Foam/compliant surface;Other (comment)  head turns    Other Standing Exercises narrow BOS and tandem stance on solid surface with body blade 2 ways; cone rotation reaches progressing in difficulty on foam            PT Education - 05/08/16 1511    Education provided Yes   Education Details HEP update    Person(s) Educated Patient   Methods Explanation;Handout   Comprehension Verbalized understanding;Returned demonstration          PT Short Term Goals - 04/29/16 1541      PT SHORT TERM GOAL #1   Title Patient to be able to verbally state 5/5  safety concerns/precautions in order to show improved situational awareness and reduce fall risk    Baseline 11/21- 2/5 on his own, 5/5 with some cues from PT    Time 3   Period Weeks   Status Partially Met     PT SHORT TERM GOAL #2   Title Patient to score at least a 47 on BERG balance test in order to show reduced fall risk and improved safety with mobility    Baseline 11/21- 48    Time 3   Period Weeks   Status Achieved     PT SHORT TERM GOAL #3   Title Patient to be able to ambulate house hold distances with no device, minimal unsteadiness, and good safety awareness in order to show improved general mobiltiy    Baseline 11/21- doing very well, uses cane mostly outside    Time 3   Period Weeks   Status Achieved     PT SHORT TERM GOAL #4   Title Patient to correctly and consistently perform appropriate HEP, to be updated PRN    Baseline 11/21- compliant    Time 1   Period Weeks   Status Achieved           PT Long Term Goals - 04/29/16 1544      PT LONG TERM GOAL #1   Title Patient to demonstrate functional strength as being 5/5 in all tested groups in order to improve stability with gait/mobiltiy and improve functional task performance    Time 6   Period Weeks   Status On-going     PT LONG TERM GOAL #2   Title Patient to score at least a 52 on BERG balance test in order to show improved general mobility and reduced fall risk    Time 6   Period Weeks   Status On-going     PT LONG TERM GOAL #3   Title Patient to be able to ambulate unlimited distances with no assistive device, minimal unsteadiness, and no increased fall risk in order to improve general mobiltiy and facilitate return to PLOF    Baseline 11/21- he is working on getting away from the cane, he is doing well overall    Time 6   Period Weeks   Status Partially Met     PT LONG TERM GOAL #4   Title Patient to be able to ambulate outside over grass/gravel/etc with no assistive device and minimal  unsteadiness/fall risk in order to facilitate return to PLOF    Time 6   Period Weeks   Status On-going     PT LONG TERM GOAL #5   Title Patient to be participatory  in appropriate regular aerobic exercise program, at least 30 minutes in duration and at least 4 days per week, in order to maintain functional gains and improve general health status    Time 6   Period Weeks   Status On-going               Plan - 05/08/16 1512    Clinical Impression Statement Continued working on functional strength, with increased focus on strength in hip abductors and extensors this session; also continued to work on advanced functional balance tasks today with adjustments/progressions as needed in order to continue challenging patient's balance. Patient continues to do very well and may be ready for DC within the next 3-4 sessions.    Rehab Potential Excellent   Clinical Impairments Affecting Rehab Potential highly motivated to participate in skilled PT services    PT Frequency 2x / week   PT Duration 3 weeks   PT Treatment/Interventions ADLs/Self Care Home Management;Biofeedback;Cryotherapy;Moist Heat;DME Instruction;Gait training;Stair training;Functional mobility training;Therapeutic activities;Therapeutic exercise;Balance training;Neuromuscular re-education;Patient/family education;Manual techniques;Energy conservation;Taping   PT Next Visit Plan CKC activities and exercise however continue to focus on hip extensor/abd strength on mat table; continue focus on balance activities. Likely DC in 3-4 sessions.    PT Home Exercise Plan 04/08/16: sidelying clams with green TB, sit to stand with equal weight bearing, tandem stance at counter   Consulted and Agree with Plan of Care Patient      Patient will benefit from skilled therapeutic intervention in order to improve the following deficits and impairments:  Abnormal gait, Improper body mechanics, Decreased coordination, Decreased mobility, Postural  dysfunction, Decreased strength, Decreased balance, Difficulty walking, Decreased safety awareness  Visit Diagnosis: Unsteadiness on feet  Difficulty in walking, not elsewhere classified  Muscle weakness (generalized)  Other symptoms and signs involving the musculoskeletal system     Problem List Patient Active Problem List   Diagnosis Date Noted  . Gait disturbance, post-stroke 04/11/2016  . DM (diabetes mellitus), type 2 with neurological complications ([place])   . Essential hypertension   . Family history of stroke 03/24/2016  . OSA (obstructive sleep apnea) 03/24/2016  . Hypokalemia 03/24/2016  . Nontraumatic acute hemorrhage of basal ganglia ([place]) 03/24/2016  . Ataxia   . ICH (intracerebral hemorrhage) ([place]) - R basal ganglia 03/22/2016  . Hypertensive emergency   . Diabetes mellitus type 2 in nonobese ([place]) 03/19/2016  . Pacemaker battery depletion 11/14/2014  . Hyperlipidemia 11/01/2013  . Severe hypertension 04/24/2013  . Pericardial effusion secondary to minoxidil 04/24/2013  . History of CVA (cerebrovascular accident) 04/24/2013  . Second degree heart block 04/24/2013  . Pacemaker St. Jude victory, dual chamber, 2006 04/24/2013  . Cervicalgia 01/01/2011  . Muscle weakness (generalized) 01/01/2011    Deniece Ree PT, DPT [facility] [address] [place], [place], [address] Phone: [phone]   Fax:  [phone]  Name: FOX SALMINEN MRN: [id number] Date of Birth: 08/29/1940

## 2016-05-13 ENCOUNTER — Ambulatory Visit (HOSPITAL_COMMUNITY): Payer: Medicare Other | Attending: Pulmonary Disease

## 2016-05-13 DIAGNOSIS — R29898 Other symptoms and signs involving the musculoskeletal system: Secondary | ICD-10-CM

## 2016-05-13 DIAGNOSIS — M6281 Muscle weakness (generalized): Secondary | ICD-10-CM | POA: Diagnosis not present

## 2016-05-13 DIAGNOSIS — I619 Nontraumatic intracerebral hemorrhage, unspecified: Secondary | ICD-10-CM | POA: Diagnosis not present

## 2016-05-13 DIAGNOSIS — R270 Ataxia, unspecified: Secondary | ICD-10-CM | POA: Diagnosis not present

## 2016-05-13 DIAGNOSIS — R2681 Unsteadiness on feet: Secondary | ICD-10-CM | POA: Diagnosis not present

## 2016-05-13 DIAGNOSIS — R262 Difficulty in walking, not elsewhere classified: Secondary | ICD-10-CM | POA: Diagnosis not present

## 2016-05-13 NOTE — Therapy (Signed)
[facility] [place], [place], [address] Phone: [phone]   Fax:  [phone]  Physical Therapy Treatment  Patient Details  Name: Richard Alvarado MRN: [id number] Date of Birth: 1941/02/22 Referring Provider: Alysia Penna   Encounter Date: 05/13/2016      PT End of Session - 05/13/16 1436    Visit Number 9   Number of Visits 12   Date for PT Re-Evaluation 05/20/16   Authorization Type Medicare (G-codes done 6th session)   Authorization Time Period 04/08/16 to 05/20/16   Authorization - Visit Number 9   Authorization - Number of Visits 16   PT Start Time 6962   PT Stop Time 1515   PT Time Calculation (min) 40 min   Equipment Utilized During Treatment Gait belt   Activity Tolerance Patient tolerated treatment well   Behavior During Therapy [facility] for tasks assessed/performed      Past Medical History:  Diagnosis Date  . Cerebral atherosclerosis    CAROTID DOPPLER, 09/07/2009 - RIGHT AND LEFT CCAs-small-moderate amount of irregular mixed density plaque, no significant evidence of diameter reduction; RIGHT AND LEFT ICAs-moderate amount irregular mixed density plaque, no evidence of significant diameter reduction  . CVA (cerebral vascular accident) ([place]) 1994   Left brain  . History of CVA (cerebrovascular accident) 04/24/2013  . Hypertension    RENAL DOPPLER, 03/08/2009 - normal  . Hypertension in pregnancy, preeclampsia, severe 04/24/2013  . Obstructive sleep apnea 09/26/2005   AHI-19.46, during REM-36.88  . Pacemaker St. Jude victory, dual chamber, 2006 04/24/2013  . Pericardial effusion    2D ECHO, 03/07/2011 - EF >70%, normal  . Second degree heart block 04/24/2013    Past Surgical History:  Procedure Laterality Date  . EP IMPLANTABLE DEVICE N/A 11/21/2014   Procedure: PPM/BIV PPM Generator Changeout;  Surgeon: [name], MD;  Location: [facility];  Service: Cardiovascular;  Laterality: N/A;  . NM  MYOVIEW LTD  03/31/2012   Normal study, no ECG changes, EKG negative for ischemia, post-stress EF 54%  . PACEMAKER INSERTION  05/09/2005   St. Jude [place], [place] #5816, serial [phone]    There were no vitals filed for this visit.      Subjective Assessment - 05/13/16 1435    Subjective Pt stated he is making great progress with strengthening and balance training.  Reports compliance with HEP daily.     Pertinent History beta-blockers, pacemaker, HTN with history of hypertensive emergency, OSA, DM, ICH of basal ganglia   Patient Stated Goals get stronger, improve balance, get back to PLOF    Currently in Pain? No/denies              [facility] Adult PT Treatment/Exercise - 05/13/16 0001      Lumbar Exercises: Quadruped   Straight Leg Raise 10 reps;3 seconds   Straight Leg Raises Limitations hip extensino and abduction in quadruped   Opposite Arm/Leg Raise Right arm/Left leg;Left arm/Right leg;5 reps;3 seconds     Knee/Hip Exercises: Standing   Heel Raises Both;2 sets;10 reps   Heel Raises Limitations SLS heel and toe raises with 1 finger A     Knee/Hip Exercises: Supine   Single Leg Bridge Both;10 reps  cueing for form     Knee/Hip Exercises: Sidelying   Hip ABduction 10 reps   Hip ABduction Limitations cueing for form    Supine: single leg bridges 10x each      Balance Exercises - 05/13/16 1506  Balance Exercises: Standing   Tandem Stance Eyes open;Foam/compliant surface;3 reps;30 secs   SLS Eyes open;3 reps  Lt 18", Rt 30"   Sidestepping 2 reps;Theraband  RTB down long hallway             PT Short Term Goals - 04/29/16 1541      PT SHORT TERM GOAL #1   Title Patient to be able to verbally state 5/5 safety concerns/precautions in order to show improved situational awareness and reduce fall risk    Baseline 11/21- 2/5 on his own, 5/5 with some cues from PT    Time 3   Period Weeks   Status Partially Met     PT SHORT TERM GOAL #2   Title Patient  to score at least a 47 on BERG balance test in order to show reduced fall risk and improved safety with mobility    Baseline 11/21- 48    Time 3   Period Weeks   Status Achieved     PT SHORT TERM GOAL #3   Title Patient to be able to ambulate house hold distances with no device, minimal unsteadiness, and good safety awareness in order to show improved general mobiltiy    Baseline 11/21- doing very well, uses cane mostly outside    Time 3   Period Weeks   Status Achieved     PT SHORT TERM GOAL #4   Title Patient to correctly and consistently perform appropriate HEP, to be updated PRN    Baseline 11/21- compliant    Time 1   Period Weeks   Status Achieved           PT Long Term Goals - 04/29/16 1544      PT LONG TERM GOAL #1   Title Patient to demonstrate functional strength as being 5/5 in all tested groups in order to improve stability with gait/mobiltiy and improve functional task performance    Time 6   Period Weeks   Status On-going     PT LONG TERM GOAL #2   Title Patient to score at least a 52 on BERG balance test in order to show improved general mobility and reduced fall risk    Time 6   Period Weeks   Status On-going     PT LONG TERM GOAL #3   Title Patient to be able to ambulate unlimited distances with no assistive device, minimal unsteadiness, and no increased fall risk in order to improve general mobiltiy and facilitate return to PLOF    Baseline 11/21- he is working on getting away from the cane, he is doing well overall    Time 6   Period Weeks   Status Partially Met     PT LONG TERM GOAL #4   Title Patient to be able to ambulate outside over grass/gravel/etc with no assistive device and minimal unsteadiness/fall risk in order to facilitate return to PLOF    Time 6   Period Weeks   Status On-going     PT LONG TERM GOAL #5   Title Patient to be participatory in appropriate regular aerobic exercise program, at least 30 minutes in duration and at least  4 days per week, in order to maintain functional gains and improve general health status    Time 6   Period Weeks   Status On-going               Plan - 05/13/16 1516    Clinical Impression Statement Session focus on  improving strengtheing of proximal musculature as well as balance training.  Progressed core and hip extensor strengthening with quadruped activities and added sidestepping with resistance for glut med strengthening to assist with balance training.  Pt is progressing well improved SLS and just SBA with all balance activities,  No reports of pain through session, was limited by fatigue.     Rehab Potential Excellent   Clinical Impairments Affecting Rehab Potential highly motivated to participate in skilled PT services    PT Frequency 2x / week   PT Duration 3 weeks   PT Treatment/Interventions ADLs/Self Care Home Management;Biofeedback;Cryotherapy;Moist Heat;DME Instruction;Gait training;Stair training;Functional mobility training;Therapeutic activities;Therapeutic exercise;Balance training;Neuromuscular re-education;Patient/family education;Manual techniques;Energy conservation;Taping   PT Next Visit Plan CKC activities and exercise however continue to focus on hip extensor/abd strength on mat table; continue focus on balance activities. Likely DC in 2-3 sessions.       Patient will benefit from skilled therapeutic intervention in order to improve the following deficits and impairments:  Abnormal gait, Improper body mechanics, Decreased coordination, Decreased mobility, Postural dysfunction, Decreased strength, Decreased balance, Difficulty walking, Decreased safety awareness  Visit Diagnosis: Unsteadiness on feet  Difficulty in walking, not elsewhere classified  Muscle weakness (generalized)  Other symptoms and signs involving the musculoskeletal system  Nontraumatic acute hemorrhage of basal ganglia (HCC)  Ataxia     Problem List Patient Active Problem List    Diagnosis Date Noted  . Gait disturbance, post-stroke 04/11/2016  . DM (diabetes mellitus), type 2 with neurological complications ([place])   . Essential hypertension   . Family history of stroke 03/24/2016  . OSA (obstructive sleep apnea) 03/24/2016  . Hypokalemia 03/24/2016  . Nontraumatic acute hemorrhage of basal ganglia ([place]) 03/24/2016  . Ataxia   . ICH (intracerebral hemorrhage) ([place]) - R basal ganglia 03/22/2016  . Hypertensive emergency   . Diabetes mellitus type 2 in nonobese ([place]) 03/19/2016  . Pacemaker battery depletion 11/14/2014  . Hyperlipidemia 11/01/2013  . Severe hypertension 04/24/2013  . Pericardial effusion secondary to minoxidil 04/24/2013  . History of CVA (cerebrovascular accident) 04/24/2013  . Second degree heart block 04/24/2013  . Pacemaker St. Jude victory, dual chamber, 2006 04/24/2013  . Cervicalgia 01/01/2011  . Muscle weakness (generalized) 01/01/2011   Ihor Austin, [place]; [place]  Aldona Lento 05/13/2016, 3:32 PM  [facility] [address] [place], [place], [address] Phone: [phone]   Fax:  [phone]  Name: Richard Alvarado MRN: [id number] Date of Birth: 18-Jul-1940

## 2016-05-15 ENCOUNTER — Ambulatory Visit (HOSPITAL_COMMUNITY): Payer: Medicare Other | Admitting: Physical Therapy

## 2016-05-15 DIAGNOSIS — I619 Nontraumatic intracerebral hemorrhage, unspecified: Secondary | ICD-10-CM | POA: Diagnosis not present

## 2016-05-15 DIAGNOSIS — R270 Ataxia, unspecified: Secondary | ICD-10-CM | POA: Diagnosis not present

## 2016-05-15 DIAGNOSIS — R2681 Unsteadiness on feet: Secondary | ICD-10-CM | POA: Diagnosis not present

## 2016-05-15 DIAGNOSIS — M6281 Muscle weakness (generalized): Secondary | ICD-10-CM | POA: Diagnosis not present

## 2016-05-15 DIAGNOSIS — R29898 Other symptoms and signs involving the musculoskeletal system: Secondary | ICD-10-CM | POA: Diagnosis not present

## 2016-05-15 DIAGNOSIS — R262 Difficulty in walking, not elsewhere classified: Secondary | ICD-10-CM

## 2016-05-15 NOTE — Therapy (Signed)
[facility], [place], [address] Phone: [phone]   Fax:  [phone]  Physical Therapy Treatment  Patient Details  Name: Richard Alvarado MRN: [id number] Date of Birth: 30-Sep-1940 Referring Provider: Alysia Penna   Encounter Date: 05/15/2016      PT End of Session - 05/15/16 1512    Visit Number 10   Number of Visits 12   Date for PT Re-Evaluation 05/20/16   Authorization Type Medicare (G-codes done 6th session)   Authorization Time Period 04/08/16 to 05/20/16   Authorization - Visit Number 10   Authorization - Number of Visits 16   PT Start Time 9163   PT Stop Time 1510   PT Time Calculation (min) 38 min   Equipment Utilized During Treatment Gait belt   Activity Tolerance Patient tolerated treatment well   Behavior During Therapy [facility] for tasks assessed/performed      Past Medical History:  Diagnosis Date  . Cerebral atherosclerosis    CAROTID DOPPLER, 09/07/2009 - RIGHT AND LEFT CCAs-small-moderate amount of irregular mixed density plaque, no significant evidence of diameter reduction; RIGHT AND LEFT ICAs-moderate amount irregular mixed density plaque, no evidence of significant diameter reduction  . CVA (cerebral vascular accident) ([place]) 1994   Left brain  . History of CVA (cerebrovascular accident) 04/24/2013  . Hypertension    RENAL DOPPLER, 03/08/2009 - normal  . Hypertension in pregnancy, preeclampsia, severe 04/24/2013  . Obstructive sleep apnea 09/26/2005   AHI-19.46, during REM-36.88  . Pacemaker St. Jude victory, dual chamber, 2006 04/24/2013  . Pericardial effusion    2D ECHO, 03/07/2011 - EF >70%, normal  . Second degree heart block 04/24/2013    Past Surgical History:  Procedure Laterality Date  . EP IMPLANTABLE DEVICE N/A 11/21/2014   Procedure: PPM/BIV PPM Generator Changeout;  Surgeon: [name], MD;  Location: [place] [facility];  Service: Cardiovascular;  Laterality: N/A;  .  NM MYOVIEW LTD  03/31/2012   Normal study, no ECG changes, EKG negative for ischemia, post-stress EF 54%  . PACEMAKER INSERTION  05/09/2005   St. Jude [place], [place] #[id number], serial [phone]    There were no vitals filed for this visit.      Subjective Assessment - 05/15/16 1435    Subjective Patient arrives today stating he is doing well, nothing major going on. He felt good after last session.    Pertinent History beta-blockers, pacemaker, HTN with history of hypertensive emergency, OSA, DM, ICH of basal ganglia   Currently in Pain? No/denies                         [facility] Adult PT Treatment/Exercise - 05/15/16 0001      Lumbar Exercises: Supine   Bridge 15 reps   Bridge Limitations U LE      Lumbar Exercises: Sidelying   Clam 20 reps   Clam Limitations green TB    Hip Abduction 10 reps   Hip Abduction Weights (lbs) 2#      Lumbar Exercises: Prone   Straight Leg Raise 15 reps             Balance Exercises - 05/15/16 1511      Balance Exercises: Standing   Other Standing Exercises step ups forward and lateral on BOSU; obstacle course  navigation; DGI based training; ball rolls in SLS; maintaining position on BOSU with no UEs  PT Education - 05/15/16 1512    Education provided Yes   Education Details DC in 2 sessions    Person(s) Educated Patient   Methods Explanation   Comprehension Verbalized understanding          PT Short Term Goals - 04/29/16 1541      PT SHORT TERM GOAL #1   Title Patient to be able to verbally state 5/5 safety concerns/precautions in order to show improved situational awareness and reduce fall risk    Baseline 11/21- 2/5 on his own, 5/5 with some cues from PT    Time 3   Period Weeks   Status Partially Met     PT SHORT TERM GOAL #2   Title Patient to score at least a 47 on BERG balance test in order to show reduced fall risk and improved safety with mobility    Baseline 11/21- 48    Time 3   Period  Weeks   Status Achieved     PT SHORT TERM GOAL #3   Title Patient to be able to ambulate house hold distances with no device, minimal unsteadiness, and good safety awareness in order to show improved general mobiltiy    Baseline 11/21- doing very well, uses cane mostly outside    Time 3   Period Weeks   Status Achieved     PT SHORT TERM GOAL #4   Title Patient to correctly and consistently perform appropriate HEP, to be updated PRN    Baseline 11/21- compliant    Time 1   Period Weeks   Status Achieved           PT Long Term Goals - 04/29/16 1544      PT LONG TERM GOAL #1   Title Patient to demonstrate functional strength as being 5/5 in all tested groups in order to improve stability with gait/mobiltiy and improve functional task performance    Time 6   Period Weeks   Status On-going     PT LONG TERM GOAL #2   Title Patient to score at least a 52 on BERG balance test in order to show improved general mobility and reduced fall risk    Time 6   Period Weeks   Status On-going     PT LONG TERM GOAL #3   Title Patient to be able to ambulate unlimited distances with no assistive device, minimal unsteadiness, and no increased fall risk in order to improve general mobiltiy and facilitate return to PLOF    Baseline 11/21- he is working on getting away from the cane, he is doing well overall    Time 6   Period Weeks   Status Partially Met     PT LONG TERM GOAL #4   Title Patient to be able to ambulate outside over grass/gravel/etc with no assistive device and minimal unsteadiness/fall risk in order to facilitate return to PLOF    Time 6   Period Weeks   Status On-going     PT LONG TERM GOAL #5   Title Patient to be participatory in appropriate regular aerobic exercise program, at least 30 minutes in duration and at least 4 days per week, in order to maintain functional gains and improve general health status    Time 6   Period Weeks   Status On-going                Plan - 05/15/16 1512    Clinical Impression Statement Continued progression of functional strength  and balance today, continuing to progress difficulty and reps as appropriate and necessary; discussed progress and current functional status which appears to be quite high at this time- patient agreeable to DC after 2 more skilled sessions.    Rehab Potential Excellent   Clinical Impairments Affecting Rehab Potential highly motivated to participate in skilled PT services    PT Frequency 2x / week   PT Duration 3 weeks   PT Treatment/Interventions ADLs/Self Care Home Management;Biofeedback;Cryotherapy;Moist Heat;DME Instruction;Gait training;Stair training;Functional mobility training;Therapeutic activities;Therapeutic exercise;Balance training;Neuromuscular re-education;Patient/family education;Manual techniques;Energy conservation;Taping   PT Next Visit Plan CKC activities and exercise however continue to focus on hip extensor/abd strength on mat table; continue focus on balance activities. Likely DC in 2 sessions.     PT Home Exercise Plan 04/08/16: sidelying clams with green TB, sit to stand with equal weight bearing, tandem stance at counter   Consulted and Agree with Plan of Care Patient      Patient will benefit from skilled therapeutic intervention in order to improve the following deficits and impairments:  Abnormal gait, Improper body mechanics, Decreased coordination, Decreased mobility, Postural dysfunction, Decreased strength, Decreased balance, Difficulty walking, Decreased safety awareness  Visit Diagnosis: Unsteadiness on feet  Difficulty in walking, not elsewhere classified  Muscle weakness (generalized)  Other symptoms and signs involving the musculoskeletal system     Problem List Patient Active Problem List   Diagnosis Date Noted  . Gait disturbance, post-stroke 04/11/2016  . DM (diabetes mellitus), type 2 with neurological complications ([place])   . Essential  hypertension   . Family history of stroke 03/24/2016  . OSA (obstructive sleep apnea) 03/24/2016  . Hypokalemia 03/24/2016  . Nontraumatic acute hemorrhage of basal ganglia ([place]) 03/24/2016  . Ataxia   . ICH (intracerebral hemorrhage) ([place]) - R basal ganglia 03/22/2016  . Hypertensive emergency   . Diabetes mellitus type 2 in nonobese ([place]) 03/19/2016  . Pacemaker battery depletion 11/14/2014  . Hyperlipidemia 11/01/2013  . Severe hypertension 04/24/2013  . Pericardial effusion secondary to minoxidil 04/24/2013  . History of CVA (cerebrovascular accident) 04/24/2013  . Second degree heart block 04/24/2013  . Pacemaker St. Jude victory, dual chamber, 2006 04/24/2013  . Cervicalgia 01/01/2011  . Muscle weakness (generalized) 01/01/2011    Deniece Ree PT, DPT [place] [address] [place], [place], [address] Phone: [phone]   Fax:  [phone]  Name: Richard Alvarado MRN: [id number] Date of Birth: 12/23/1940

## 2016-05-20 ENCOUNTER — Ambulatory Visit (HOSPITAL_COMMUNITY): Payer: Medicare Other | Admitting: Physical Therapy

## 2016-05-20 DIAGNOSIS — R262 Difficulty in walking, not elsewhere classified: Secondary | ICD-10-CM | POA: Diagnosis not present

## 2016-05-20 DIAGNOSIS — M6281 Muscle weakness (generalized): Secondary | ICD-10-CM

## 2016-05-20 DIAGNOSIS — R29898 Other symptoms and signs involving the musculoskeletal system: Secondary | ICD-10-CM | POA: Diagnosis not present

## 2016-05-20 DIAGNOSIS — I619 Nontraumatic intracerebral hemorrhage, unspecified: Secondary | ICD-10-CM | POA: Diagnosis not present

## 2016-05-20 DIAGNOSIS — R2681 Unsteadiness on feet: Secondary | ICD-10-CM | POA: Diagnosis not present

## 2016-05-20 DIAGNOSIS — R270 Ataxia, unspecified: Secondary | ICD-10-CM | POA: Diagnosis not present

## 2016-05-20 NOTE — Therapy (Signed)
[facility] [place], [place], [address] Phone: [phone]   Fax:  [phone]  Physical Therapy Treatment (Discharge)  Patient Details  Name: Richard Alvarado MRN: [id number] Date of Birth: [birth year] Referring Provider: Alysia Penna   Encounter Date: 05/20/2016      PT End of Session - 05/20/16 1547    Visit Number 11   Number of Visits 11   Date for PT Re-Evaluation 05/20/16   Authorization Type Medicare (G-codes done 6th session)   Authorization Time Period 04/08/16 to 05/20/16   Authorization - Visit Number 11   Authorization - Number of Visits 16   PT Start Time 6599   PT Stop Time 1546   PT Time Calculation (min) 30 min   Activity Tolerance Patient tolerated treatment well   Behavior During Therapy [facility] for tasks assessed/performed      Past Medical History:  Diagnosis Date  . Cerebral atherosclerosis    CAROTID DOPPLER, 09/07/2009 - RIGHT AND LEFT CCAs-small-moderate amount of irregular mixed density plaque, no significant evidence of diameter reduction; RIGHT AND LEFT ICAs-moderate amount irregular mixed density plaque, no evidence of significant diameter reduction  . CVA (cerebral vascular accident) ([place]) 1994   Left brain  . History of CVA (cerebrovascular accident) 04/24/2013  . Hypertension    RENAL DOPPLER, 03/08/2009 - normal  . Hypertension in pregnancy, preeclampsia, severe 04/24/2013  . Obstructive sleep apnea 09/26/2005   AHI-19.46, during REM-36.88  . Pacemaker St. Jude victory, dual chamber, 2006 04/24/2013  . Pericardial effusion    2D ECHO, 03/07/2011 - EF >70%, normal  . Second degree heart block 04/24/2013    Past Surgical History:  Procedure Laterality Date  . EP IMPLANTABLE DEVICE N/A 11/21/2014   Procedure: PPM/BIV PPM Generator Changeout;  Surgeon: [name], MD;  Location: [place] [facility];  Service: Cardiovascular;  Laterality: N/A;  . NM MYOVIEW LTD  03/31/2012   Normal  study, no ECG changes, EKG negative for ischemia, post-stress EF 54%  . PACEMAKER INSERTION  05/09/2005   St. Jude [place], [place] #[id number], serial [phone]    There were no vitals filed for this visit.      Subjective Assessment - 05/20/16 1519    Subjective Patient arrives stating he is feeling very good, there is nothing that is hard for him to do. Nothing major going on.    Pertinent History beta-blockers, pacemaker, HTN with history of hypertensive emergency, OSA, DM, ICH of basal ganglia   How long can you stand comfortably? 12/12- unlimited    How long can you walk comfortably? 12/12- unlimited    Patient Stated Goals get stronger, improve balance, get back to PLOF    Currently in Pain? No/denies            [facility] PT Assessment - 05/20/16 0001      Strength   Right Hip Flexion 5/5   Right Hip Extension 4/5   Right Hip ABduction 4+/5   Left Hip Flexion 4+/5   Left Hip Extension 4/5   Left Hip ABduction 4-/5   Right Knee Flexion 5/5   Right Knee Extension 5/5   Left Knee Flexion 4+/5   Left Knee Extension 5/5   Right Ankle Dorsiflexion 5/5   Left Ankle Dorsiflexion 5/5     6 minute walk test results    Aerobic Endurance Distance Walked 759   Endurance additional comments 3MWT      Berg Balance Test   Sit to Stand  Able to stand without using hands and stabilize independently   Standing Unsupported Able to stand safely 2 minutes   Sitting with Back Unsupported but Feet Supported on Floor or Stool Able to sit safely and securely 2 minutes   Stand to Sit Sits safely with minimal use of hands   Transfers Able to transfer safely, minor use of hands   Standing Unsupported with Eyes Closed Able to stand 10 seconds safely   Standing Ubsupported with Feet Together Able to place feet together independently and stand 1 minute safely   From Standing, Reach Forward with Outstretched Arm Can reach confidently >25 cm (10")   From Standing Position, Pick up Object from Floor Able to  pick up shoe safely and easily   From Standing Position, Turn to Look Behind Over each Shoulder Looks behind one side only/other side shows less weight shift   Turn 360 Degrees Able to turn 360 degrees safely but slowly   Standing Unsupported, Alternately Place Feet on Step/Stool Able to stand independently and safely and complete 8 steps in 20 seconds   Standing Unsupported, One Foot in [place] to place foot tandem independently and hold 30 seconds   Standing on One Leg Able to lift leg independently and hold 5-10 seconds   Total Score 52                             PT Education - 05/20/16 1547    Education provided Yes   Education Details DC today    Person(s) Educated Patient   Methods Explanation   Comprehension Verbalized understanding          PT Short Term Goals - 05/20/16 1533      PT SHORT TERM GOAL #1   Title Patient to be able to verbally state 5/5 safety concerns/precautions in order to show improved situational awareness and reduce fall risk    Baseline 12/12- 4/5 onhis own    Time 3   Period Weeks   Status Partially Met     PT SHORT TERM GOAL #2   Title Patient to score at least a 47 on BERG balance test in order to show reduced fall risk and improved safety with mobility    Baseline 12/12- 52   Time 3   Period Weeks   Status Achieved     PT SHORT TERM GOAL #3   Title Patient to be able to ambulate house hold distances with no device, minimal unsteadiness, and good safety awareness in order to show improved general mobiltiy    Time 3   Period Weeks   Status Achieved     PT SHORT TERM GOAL #4   Title Patient to correctly and consistently perform appropriate HEP, to be updated PRN    Status Achieved           PT Long Term Goals - 05/20/16 1535      PT LONG TERM GOAL #1   Title Patient to demonstrate functional strength as being 5/5 in all tested groups in order to improve stability with gait/mobiltiy and improve functional  task performance    Time 6   Period Weeks   Status Partially Met     PT LONG TERM GOAL #2   Title Patient to score at least a 52 on BERG balance test in order to show improved general mobility and reduced fall risk    Baseline 12/12- 52   Time 6  Period Weeks   Status Achieved     PT LONG TERM GOAL #3   Title Patient to be able to ambulate unlimited distances with no assistive device, minimal unsteadiness, and no increased fall risk in order to improve general mobiltiy and facilitate return to PLOF    Time 6   Period Weeks   Status Achieved     PT LONG TERM GOAL #4   Title Patient to be able to ambulate outside over grass/gravel/etc with no assistive device and minimal unsteadiness/fall risk in order to facilitate return to PLOF    Time 6   Period Weeks   Status Achieved     PT LONG TERM GOAL #5   Title Patient to be participatory in appropriate regular aerobic exercise program, at least 30 minutes in duration and at least 4 days per week, in order to maintain functional gains and improve general health status    Time 6   Period Weeks   Status Achieved               Plan - 14-Jun-2016 1548    Clinical Impression Statement Re-assessment performed today. Patient has made excellent progress with skilled PT services, with main remaining functional limitations being mild functional weakness and mild functional balance impairment, however patient is doing very well and presents as minimal fall risk at this time. DC today due to high functional status.    Rehab Potential Excellent   Clinical Impairments Affecting Rehab Potential highly motivated to participate in skilled PT services    PT Next Visit Plan DC today    Consulted and Agree with Plan of Care Patient      Patient will benefit from skilled therapeutic intervention in order to improve the following deficits and impairments:  Abnormal gait, Improper body mechanics, Decreased coordination, Decreased mobility, Postural  dysfunction, Decreased strength, Decreased balance, Difficulty walking, Decreased safety awareness  Visit Diagnosis: Unsteadiness on feet  Difficulty in walking, not elsewhere classified  Muscle weakness (generalized)  Other symptoms and signs involving the musculoskeletal system       G-Codes - 2016/06/14 1552    Functional Assessment Tool Used Based on skilled clinical assessment of balance, strength, gait    Functional Limitation Mobility: Walking and moving around   Mobility: Walking and Moving Around Goal Status [phone]) At least 1 percent but less than 20 percent impaired, limited or restricted   Mobility: Walking and Moving Around Discharge Status [phone]) At least 1 percent but less than 20 percent impaired, limited or restricted      Problem List Patient Active Problem List   Diagnosis Date Noted  . Gait disturbance, post-stroke 04/11/2016  . DM (diabetes mellitus), type 2 with neurological complications ([place])   . Essential hypertension   . Family history of stroke 03/24/2016  . OSA (obstructive sleep apnea) 03/24/2016  . Hypokalemia 03/24/2016  . Nontraumatic acute hemorrhage of basal ganglia ([place]) 03/24/2016  . Ataxia   . ICH (intracerebral hemorrhage) ([place]) - R basal ganglia 03/22/2016  . Hypertensive emergency   . Diabetes mellitus type 2 in nonobese ([place]) 03/19/2016  . Pacemaker battery depletion 11/14/2014  . Hyperlipidemia 11/01/2013  . Severe hypertension 04/24/2013  . Pericardial effusion secondary to minoxidil 04/24/2013  . History of CVA (cerebrovascular accident) 04/24/2013  . Second degree heart block 04/24/2013  . Pacemaker St. Jude victory, dual chamber, 2006 04/24/2013  . Cervicalgia 01/01/2011  . Muscle weakness (generalized) 01/01/2011   PHYSICAL THERAPY DISCHARGE SUMMARY  Visits from Start of Care:  11  Current functional level related to goals / functional outcomes: Patient doing extremely well with skilled PT services at this time, reports  that he has not functional difficulties at this time. DC today due to high level of function.    Remaining deficits: Mild functional strength and balance deficits    Education / Equipment: Continue regular aerobic activity, DC today  Plan: Patient agrees to discharge.  Patient goals were met. Patient is being discharged due to meeting the stated rehab goals.  ?????      Deniece Ree PT, DPT [place] [address] [place], [place], [address] Phone: [phone]   Fax:  [phone]  Name: Richard Alvarado MRN: [id number] Date of Birth: 1940-10-05

## 2016-05-21 ENCOUNTER — Ambulatory Visit (INDEPENDENT_AMBULATORY_CARE_PROVIDER_SITE_OTHER): Payer: Medicare Other | Admitting: Diagnostic Neuroimaging

## 2016-05-21 ENCOUNTER — Encounter: Payer: Self-pay | Admitting: Diagnostic Neuroimaging

## 2016-05-21 VITALS — BP 170/86 | HR 63 | Ht 69.0 in | Wt 192.0 lb

## 2016-05-21 DIAGNOSIS — I10 Essential (primary) hypertension: Secondary | ICD-10-CM | POA: Diagnosis not present

## 2016-05-21 DIAGNOSIS — I610 Nontraumatic intracerebral hemorrhage in hemisphere, subcortical: Secondary | ICD-10-CM | POA: Diagnosis not present

## 2016-05-21 NOTE — Progress Notes (Signed)
[facility]  PATIENT: Richard Alvarado DOB: 1941-02-06  REFERRING CLINICIAN: Erlinda Hong / stroke team HISTORY FROM: patient and wife  REASON FOR VISIT: new consult    HISTORICAL  CHIEF COMPLAINT:  Chief Complaint  Patient presents with  . Cerebrovascular Accident    RM 7, hospital follow up, Wife- Lena    HISTORY OF PRESENT ILLNESS:   75 year old male with history of hypertension, objective sleep apnea, prostate cancer, stroke, diabetes, here for evaluation of right brain basal ganglia intracerebral hemorrhage. 03/22/16 - 03/24/16 - stroke admission and evaluation due to onset of lower extremity weakness and difficulty standing up. Patient went to the emergency room and right basal ganglia intracerebral hemorrhage and elevated hypertension was noted. Hemorrhage is felt to be due to accelerated hypertension. 03/24/16-04/03/16 - inpatient rehab stay with improvement in symptoms. Patient feels 80% back to normal.  Patient now back home. He is doing well. His blood pressure still fluctuates. His blood pressure seems to be normal at home but always elevated in the office.   REVIEW OF SYSTEMS: Full 14 system review of systems performed and negative with exception of: Only as per history of present illness.  ALLERGIES: No Known Allergies  HOME MEDICATIONS: Outpatient Medications Prior to Visit  Medication Sig Dispense Refill  . amLODipine (NORVASC) 10 MG tablet Take 1 tablet (10 mg total) by mouth daily. 30 tablet 6  . atorvastatin (LIPITOR) 80 MG tablet Take 80 mg by mouth at bedtime.    Marland Kitchen BYSTOLIC 10 MG tablet TAKE 1 TABLET (10 MG TOTAL) BY MOUTH DAILY. 30 tablet 0  . cloNIDine (CATAPRES) 0.3 MG tablet Take 1 tablet (0.3 mg total) by mouth 3 (three) times daily. 90 tablet 6  . furosemide (LASIX) 40 MG tablet Take 1 tablet (40 mg total) by mouth daily. 30 tablet 0  . glipiZIDE (GLUCOTROL) 5 MG tablet Take 1 tablet (5 mg total) by mouth 2 (two) times daily before a  meal. 60 tablet 0  . losartan (COZAAR) 100 MG tablet Take 1 tablet (100 mg total) by mouth 2 (two) times daily. 60 tablet 0  . metFORMIN (GLUCOPHAGE) 1000 MG tablet Take 1 tablet (1,000 mg total) by mouth 2 (two) times daily with a meal. 60 tablet 0  . pantoprazole (PROTONIX) 40 MG tablet Take 1 tablet (40 mg total) by mouth daily. 30 tablet 0  . senna (SENOKOT) 8.6 MG tablet Take 1 tablet by mouth daily as needed for constipation.     No facility-administered medications prior to visit.     PAST MEDICAL HISTORY: Past Medical History:  Diagnosis Date  . Cancer [facility]) 2002   prostate  . Cerebral atherosclerosis    CAROTID DOPPLER, 09/07/2009 - RIGHT AND LEFT CCAs-small-moderate amount of irregular mixed density plaque, no significant evidence of diameter reduction; RIGHT AND LEFT ICAs-moderate amount irregular mixed density plaque, no evidence of significant diameter reduction  . CVA (cerebral vascular accident) ([place]) 1994   Left brain  . Diabetes mellitus without complication ([place])   . History of CVA (cerebrovascular accident) 04/24/2013  . Hypercholesterolemia   . Hypertension    RENAL DOPPLER, 03/08/2009 - normal  . Hypertension in pregnancy, preeclampsia, severe 04/24/2013  . Obstructive sleep apnea 09/26/2005   AHI-19.46, during REM-36.88  . Pacemaker St. Jude victory, dual chamber, 2006 04/24/2013  . Pericardial effusion    2D ECHO, 03/07/2011 - EF >70%, normal  . Second degree heart block 04/24/2013    PAST SURGICAL HISTORY: Past Surgical History:  Procedure  Laterality Date  . EP IMPLANTABLE DEVICE N/A 11/21/2014   Procedure: PPM/BIV PPM Generator Changeout;  Surgeon: [name], MD;  Location: [place] [facility];  Service: Cardiovascular;  Laterality: N/A;  . NM MYOVIEW LTD  03/31/2012   Normal study, no ECG changes, EKG negative for ischemia, post-stress EF 54%  . PACEMAKER INSERTION  05/09/2005   St. Jude [place], [place] #[id number], serial [id number], replaced 2016  . PROSTATE  SURGERY  2002   removed- cancer    FAMILY HISTORY: Family History  Problem Relation Age of Onset  . CVA Mother   . Stroke Mother   . Heart attack Father   . Hypertension Sister   . Heart disease Maternal Grandmother     SOCIAL HISTORY:  Social History   Social History  . Marital status: Married    Spouse name: [name]  . Number of children: 1  . Years of education: 60   Occupational History  .      [employer], retired Garment/textile technologist   Social History Main Topics  . Smoking status: Former Smoker    Quit date: 06/09/1999  . Smokeless tobacco: Never Used  . Alcohol use No  . Drug use: No  . Sexual activity: Not on file   Other Topics Concern  . Not on file   Social History Narrative   Lives with wife   Caffeine free coffee only     PHYSICAL EXAM  GENERAL EXAM/CONSTITUTIONAL: Vitals:  Vitals:   05/21/16 1114  BP: (!) 183/104  Pulse: 65  Weight: 192 lb (87.1 kg)  Height: 5\' 9"  (1.753 m)     Body mass index is 28.35 kg/m.  Visual Acuity Screening   Right eye Left eye Both eyes  Without correction: 20/30 20/30   With correction:        Patient is in no distress; well developed, nourished and groomed; neck is supple  CARDIOVASCULAR:  Examination of carotid arteries is normal; no carotid bruits  Regular rate and rhythm, no murmurs  Examination of peripheral vascular system by observation and palpation is normal  EYES:  Ophthalmoscopic exam of optic discs and posterior segments is normal; no papilledema or hemorrhages  MUSCULOSKELETAL:  Gait, strength, tone, movements noted in Neurologic exam below  NEUROLOGIC: MENTAL STATUS:  No flowsheet data found.  awake, alert, oriented to person, place and time  recent and remote memory intact  normal attention and concentration  language fluent, comprehension intact, naming intact,   fund of knowledge appropriate  CRANIAL NERVE:   2nd - no papilledema on fundoscopic exam  2nd, 3rd, 4th, 6th  - pupils equal and reactive to light, visual fields full to confrontation, extraocular muscles intact, no nystagmus  5th - facial sensation --> DECR IN LEFT SIDE  7th - facial strength symmetric  8th - hearing intact  9th - palate elevates symmetrically, uvula midline  11th - shoulder shrug symmetric  12th - tongue protrusion midline  MOTOR:   normal bulk and tone, full strength in the BUE, BLE  SENSORY:   normal and symmetric to light touch, temperature, vibration  EXCEPT DECR IN LEFT ARM AND LEFT LEG  COORDINATION:   finger-nose-finger, fine finger movements normal  REFLEXES:   deep tendon reflexes TRACE and symmetric  GAIT/STATION:   narrow based gait; CAUTIOUS GAIT    DIAGNOSTIC DATA (LABS, IMAGING, TESTING) - I reviewed patient records, labs, notes, testing and imaging myself where available.  Lab Results  Component Value Date   WBC 5.7 03/25/2016  HGB 13.4 03/25/2016   HCT 41.3 03/25/2016   MCV 75.2 (L) 03/25/2016   PLT 216 03/25/2016      Component Value Date/Time   NA 138 03/25/2016 0639   K 3.7 03/25/2016 0639   CL 108 03/25/2016 0639   CO2 21 (L) 03/25/2016 0639   GLUCOSE 119 (H) 03/25/2016 0639   BUN 11 03/25/2016 0639   CREATININE 0.99 03/25/2016 0639   CREATININE 1.09 11/14/2014 1102   CALCIUM 9.2 03/25/2016 0639   PROT 7.7 03/25/2016 0639   ALBUMIN 3.5 03/25/2016 0639   AST 23 03/25/2016 0639   ALT 16 (L) 03/25/2016 0639   ALKPHOS 68 03/25/2016 0639   BILITOT 0.8 03/25/2016 0639   GFRNONAA >60 03/25/2016 0639   GFRAA >60 03/25/2016 0639   Lab Results  Component Value Date   CHOL 111 03/22/2016   HDL 34 (L) 03/22/2016   LDLCALC 68 03/22/2016   TRIG 43 03/22/2016   CHOLHDL 3.3 03/22/2016   Lab Results  Component Value Date   HGBA1C 6.9 (H) 03/22/2016   No results found for: VITAMINB12 Lab Results  Component Value Date   TSH 2.837 01/04/2013     03/21/16 CT head [I reviewed images myself and agree with interpretation.  -VRP]  1. Acute intraparenchymal hemorrhage centered at the right basal ganglia measuring 14 x 21 x 20 mm  (estimated volume 4 cc). Mild localized edema without significant mass effect. No intraventricular extension.  2. Probable additional punctate 6 mm parenchymal hemorrhage within the left lentiform nucleus, no associated edema. Underlying hypertensive etiologyis suspected.  3. Generalized age-related cerebral atrophy with moderate chronic microvascular ischemic disease.   03/23/16 CTA head and neck  1. Stable hemorrhage and right posterior basal ganglia and possible punctate hemorrhage within the left putamen. 2. No new acute intracranial abnormality identified. 3. Mild 20% stenosis of proximal internal carotid arteries bilaterally. 4. Plaque and tortuosity of proximal left vertebral artery and dense calcified plaque of right vertebral artery origins with probable at least moderate underlying stenosis. 5. No significant stenosis, proximal occlusion, aneurysm, or vascular malformation of the circle Willis. Specifically no abnormal vascularity in region of right posterior basal ganglia hemorrhage. 6. Intracranial atherosclerosis with dense calcification of carotid siphons and areas of mild stenosis.  03/23/16 2D Echocardiogram - Left ventricle: The cavity size was normal. Wall thickness wasincreased in a pattern of moderate LVH. Systolic function wasnormal. The estimated ejection fraction was in the range of 60%to 65%. Wall motion was normal; there were no regional wallmotion abnormalities. Doppler parameters are consistent withabnormal left ventricular relaxation (grade 1 diastolicdysfunction). - Mitral valve: Mildly calcified leaflets . - Right ventricle: Pacer wire or catheter noted in right ventricle. - Right atrium: Pacer wire or catheter noted in right atrium. - Atrial septum: No defect or patent foramen ovale was identified.  03/21/16 CXR - No acute pulmonary process  identified.     ASSESSMENT AND PLAN  75 y.o. year old male here with right basilar intracerebral hemorrhage due to accelerated hypertension. Patient has completed hospital stay and workup and inpatient rehabilitation, with significant improvement in symptoms. Going forward patient needs to continue to work on optimizing blood pressure control to avoid future stroke or hemorrhage.   Dx: right basal ganglia ICH due to hypertension  1. Nontraumatic subcortical hemorrhage of right cerebral hemisphere ([place])   2. Accelerated hypertension      PLAN: - emphasized importance of BP control - asked patient to calibrate / check BP machine with his PCP -  reviewed nutrition and physical activity changes  Return if symptoms worsen or fail to improve, for return to PCP.    Penni Bombard, MD [id number], [id number] AM Certified in Neurology, Neurophysiology and Neuroimaging  [facility] [address] [place], [place] [address] [phone]

## 2016-05-21 NOTE — Patient Instructions (Signed)

## 2016-05-22 ENCOUNTER — Ambulatory Visit (HOSPITAL_COMMUNITY): Payer: Medicare Other | Admitting: Physical Therapy

## 2016-05-26 ENCOUNTER — Other Ambulatory Visit: Payer: Self-pay | Admitting: Cardiovascular Disease

## 2016-05-27 ENCOUNTER — Ambulatory Visit (HOSPITAL_COMMUNITY): Payer: Medicare Other | Admitting: Physical Therapy

## 2016-05-27 DIAGNOSIS — I2510 Atherosclerotic heart disease of native coronary artery without angina pectoris: Secondary | ICD-10-CM | POA: Diagnosis not present

## 2016-05-27 DIAGNOSIS — I69354 Hemiplegia and hemiparesis following cerebral infarction affecting left non-dominant side: Secondary | ICD-10-CM | POA: Diagnosis not present

## 2016-05-27 DIAGNOSIS — I10 Essential (primary) hypertension: Secondary | ICD-10-CM | POA: Diagnosis not present

## 2016-05-27 DIAGNOSIS — E1165 Type 2 diabetes mellitus with hyperglycemia: Secondary | ICD-10-CM | POA: Diagnosis not present

## 2016-05-29 DIAGNOSIS — I69354 Hemiplegia and hemiparesis following cerebral infarction affecting left non-dominant side: Secondary | ICD-10-CM | POA: Diagnosis not present

## 2016-05-29 DIAGNOSIS — E1165 Type 2 diabetes mellitus with hyperglycemia: Secondary | ICD-10-CM | POA: Diagnosis not present

## 2016-05-29 DIAGNOSIS — I2510 Atherosclerotic heart disease of native coronary artery without angina pectoris: Secondary | ICD-10-CM | POA: Diagnosis not present

## 2016-05-29 DIAGNOSIS — I10 Essential (primary) hypertension: Secondary | ICD-10-CM | POA: Diagnosis not present

## 2016-06-17 ENCOUNTER — Ambulatory Visit (INDEPENDENT_AMBULATORY_CARE_PROVIDER_SITE_OTHER): Payer: Medicare Other | Admitting: *Deleted

## 2016-06-17 DIAGNOSIS — I441 Atrioventricular block, second degree: Secondary | ICD-10-CM | POA: Diagnosis not present

## 2016-06-17 NOTE — Progress Notes (Signed)
Remote pacemaker transmission.   

## 2016-06-18 ENCOUNTER — Encounter: Payer: Self-pay | Admitting: Cardiology

## 2016-07-03 LAB — CUP PACEART REMOTE DEVICE CHECK
Battery Remaining Longevity: 116 mo
Battery Remaining Percentage: 95.5 %
Battery Voltage: 3.01 V
Brady Statistic AP VP Percent: 54 %
Brady Statistic AP VS Percent: 11 %
Brady Statistic AS VP Percent: 2.8 %
Brady Statistic AS VS Percent: 32 %
Brady Statistic RA Percent Paced: 65 %
Brady Statistic RV Percent Paced: 57 %
Date Time Interrogation Session: 20180109090015
Implantable Lead Implant Date: 20061201
Implantable Lead Implant Date: 20061201
Implantable Lead Location: 753859
Implantable Lead Location: 753860
Implantable Pulse Generator Implant Date: 20160614
Lead Channel Impedance Value: 480 Ohm
Lead Channel Impedance Value: 580 Ohm
Lead Channel Pacing Threshold Amplitude: 0.375 V
Lead Channel Pacing Threshold Amplitude: 0.75 V
Lead Channel Pacing Threshold Pulse Width: 0.5 ms
Lead Channel Pacing Threshold Pulse Width: 0.5 ms
Lead Channel Sensing Intrinsic Amplitude: 1.1 mV
Lead Channel Sensing Intrinsic Amplitude: 12 mV
Lead Channel Setting Pacing Amplitude: 2 V
Lead Channel Setting Pacing Amplitude: 2 V
Lead Channel Setting Pacing Pulse Width: 0.5 ms
Lead Channel Setting Sensing Sensitivity: 2 mV
Pulse Gen Model: 2240
Pulse Gen Serial Number: 7768207

## 2016-07-28 DIAGNOSIS — G4730 Sleep apnea, unspecified: Secondary | ICD-10-CM | POA: Diagnosis not present

## 2016-07-28 DIAGNOSIS — I69354 Hemiplegia and hemiparesis following cerebral infarction affecting left non-dominant side: Secondary | ICD-10-CM | POA: Diagnosis not present

## 2016-07-28 DIAGNOSIS — I10 Essential (primary) hypertension: Secondary | ICD-10-CM | POA: Diagnosis not present

## 2016-07-28 DIAGNOSIS — I2510 Atherosclerotic heart disease of native coronary artery without angina pectoris: Secondary | ICD-10-CM | POA: Diagnosis not present

## 2016-09-16 ENCOUNTER — Ambulatory Visit (INDEPENDENT_AMBULATORY_CARE_PROVIDER_SITE_OTHER): Payer: Medicare Other | Admitting: *Deleted

## 2016-09-16 DIAGNOSIS — I441 Atrioventricular block, second degree: Secondary | ICD-10-CM | POA: Diagnosis not present

## 2016-09-16 NOTE — Progress Notes (Signed)
Remote pacemaker transmission.   

## 2016-09-17 ENCOUNTER — Encounter: Payer: Self-pay | Admitting: Cardiology

## 2016-09-17 NOTE — Progress Notes (Signed)
Letter  

## 2016-09-19 LAB — CUP PACEART REMOTE DEVICE CHECK
Battery Remaining Longevity: 115 mo
Battery Remaining Percentage: 95.5 %
Battery Voltage: 3.01 V
Brady Statistic AP VP Percent: 59 %
Brady Statistic AP VS Percent: 7.5 %
Brady Statistic AS VP Percent: 3.1 %
Brady Statistic AS VS Percent: 31 %
Brady Statistic RA Percent Paced: 66 %
Brady Statistic RV Percent Paced: 62 %
Date Time Interrogation Session: 20180410090942
Implantable Lead Implant Date: 20061201
Implantable Lead Implant Date: 20061201
Implantable Lead Location: 753859
Implantable Lead Location: 753860
Implantable Pulse Generator Implant Date: 20160614
Lead Channel Impedance Value: 490 Ohm
Lead Channel Impedance Value: 550 Ohm
Lead Channel Pacing Threshold Amplitude: 0.375 V
Lead Channel Pacing Threshold Amplitude: 0.75 V
Lead Channel Pacing Threshold Pulse Width: 0.5 ms
Lead Channel Pacing Threshold Pulse Width: 0.5 ms
Lead Channel Sensing Intrinsic Amplitude: 1.3 mV
Lead Channel Sensing Intrinsic Amplitude: 11.5 mV
Lead Channel Setting Pacing Amplitude: 2 V
Lead Channel Setting Pacing Amplitude: 2 V
Lead Channel Setting Pacing Pulse Width: 0.5 ms
Lead Channel Setting Sensing Sensitivity: 2 mV
Pulse Gen Model: 2240
Pulse Gen Serial Number: 7768207

## 2016-10-27 DIAGNOSIS — G4733 Obstructive sleep apnea (adult) (pediatric): Secondary | ICD-10-CM | POA: Diagnosis not present

## 2016-10-27 DIAGNOSIS — I69354 Hemiplegia and hemiparesis following cerebral infarction affecting left non-dominant side: Secondary | ICD-10-CM | POA: Diagnosis not present

## 2016-10-27 DIAGNOSIS — E119 Type 2 diabetes mellitus without complications: Secondary | ICD-10-CM | POA: Diagnosis not present

## 2016-10-27 DIAGNOSIS — I10 Essential (primary) hypertension: Secondary | ICD-10-CM | POA: Diagnosis not present

## 2016-10-29 DIAGNOSIS — Z8546 Personal history of malignant neoplasm of prostate: Secondary | ICD-10-CM | POA: Diagnosis not present

## 2016-11-05 ENCOUNTER — Other Ambulatory Visit: Payer: Self-pay | Admitting: Physician Assistant

## 2016-11-05 NOTE — Telephone Encounter (Signed)
Rx has been sent to the pharmacy electronically. ° °

## 2016-11-11 ENCOUNTER — Other Ambulatory Visit: Payer: Self-pay | Admitting: Physician Assistant

## 2016-11-11 ENCOUNTER — Ambulatory Visit (INDEPENDENT_AMBULATORY_CARE_PROVIDER_SITE_OTHER): Payer: Medicare Other | Admitting: Urology

## 2016-11-11 DIAGNOSIS — Z8546 Personal history of malignant neoplasm of prostate: Secondary | ICD-10-CM | POA: Diagnosis not present

## 2016-11-11 DIAGNOSIS — N5231 Erectile dysfunction following radical prostatectomy: Secondary | ICD-10-CM | POA: Diagnosis not present

## 2016-12-16 ENCOUNTER — Ambulatory Visit (INDEPENDENT_AMBULATORY_CARE_PROVIDER_SITE_OTHER): Payer: Medicare Other | Admitting: *Deleted

## 2016-12-16 ENCOUNTER — Telehealth: Payer: Self-pay | Admitting: Cardiology

## 2016-12-16 DIAGNOSIS — I441 Atrioventricular block, second degree: Secondary | ICD-10-CM | POA: Diagnosis not present

## 2016-12-16 NOTE — Telephone Encounter (Signed)
Confirmed remote transmission w/ pt wife.   

## 2016-12-16 NOTE — Progress Notes (Signed)
Remote pacemaker transmission.   

## 2016-12-18 LAB — CUP PACEART REMOTE DEVICE CHECK
Battery Remaining Longevity: 115 mo
Battery Remaining Percentage: 95.5 %
Battery Voltage: 3.01 V
Brady Statistic AP VP Percent: 60 %
Brady Statistic AP VS Percent: 5.8 %
Brady Statistic AS VP Percent: 4.1 %
Brady Statistic AS VS Percent: 30 %
Brady Statistic RA Percent Paced: 65 %
Brady Statistic RV Percent Paced: 64 %
Date Time Interrogation Session: 20180710160857
Implantable Lead Implant Date: 20061201
Implantable Lead Implant Date: 20061201
Implantable Lead Location: 753859
Implantable Lead Location: 753860
Implantable Pulse Generator Implant Date: 20160614
Lead Channel Impedance Value: 460 Ohm
Lead Channel Impedance Value: 560 Ohm
Lead Channel Pacing Threshold Amplitude: 0.375 V
Lead Channel Pacing Threshold Amplitude: 0.75 V
Lead Channel Pacing Threshold Pulse Width: 0.5 ms
Lead Channel Pacing Threshold Pulse Width: 0.5 ms
Lead Channel Sensing Intrinsic Amplitude: 1.5 mV
Lead Channel Sensing Intrinsic Amplitude: 12 mV
Lead Channel Setting Pacing Amplitude: 2 V
Lead Channel Setting Pacing Amplitude: 2 V
Lead Channel Setting Pacing Pulse Width: 0.5 ms
Lead Channel Setting Sensing Sensitivity: 2 mV
Pulse Gen Model: 2240
Pulse Gen Serial Number: 7768207

## 2016-12-22 ENCOUNTER — Encounter: Payer: Self-pay | Admitting: Cardiology

## 2017-01-27 DIAGNOSIS — E119 Type 2 diabetes mellitus without complications: Secondary | ICD-10-CM | POA: Diagnosis not present

## 2017-01-27 DIAGNOSIS — G4733 Obstructive sleep apnea (adult) (pediatric): Secondary | ICD-10-CM | POA: Diagnosis not present

## 2017-01-27 DIAGNOSIS — I69354 Hemiplegia and hemiparesis following cerebral infarction affecting left non-dominant side: Secondary | ICD-10-CM | POA: Diagnosis not present

## 2017-01-27 DIAGNOSIS — I10 Essential (primary) hypertension: Secondary | ICD-10-CM | POA: Diagnosis not present

## 2017-02-05 DIAGNOSIS — I69354 Hemiplegia and hemiparesis following cerebral infarction affecting left non-dominant side: Secondary | ICD-10-CM | POA: Diagnosis not present

## 2017-02-05 DIAGNOSIS — I10 Essential (primary) hypertension: Secondary | ICD-10-CM | POA: Diagnosis not present

## 2017-02-05 DIAGNOSIS — G4733 Obstructive sleep apnea (adult) (pediatric): Secondary | ICD-10-CM | POA: Diagnosis not present

## 2017-02-05 DIAGNOSIS — I2510 Atherosclerotic heart disease of native coronary artery without angina pectoris: Secondary | ICD-10-CM | POA: Diagnosis not present

## 2017-02-05 DIAGNOSIS — E119 Type 2 diabetes mellitus without complications: Secondary | ICD-10-CM | POA: Diagnosis not present

## 2017-03-02 ENCOUNTER — Other Ambulatory Visit: Payer: Self-pay

## 2017-03-02 ENCOUNTER — Other Ambulatory Visit: Payer: Self-pay | Admitting: Physician Assistant

## 2017-03-02 MED ORDER — AMLODIPINE BESYLATE 10 MG PO TABS: 10.0000 mg | ORAL_TABLET | Freq: Every day | ORAL | 10 refills | Status: DC

## 2017-03-03 ENCOUNTER — Other Ambulatory Visit: Payer: Self-pay | Admitting: Physician Assistant

## 2017-03-17 ENCOUNTER — Ambulatory Visit (INDEPENDENT_AMBULATORY_CARE_PROVIDER_SITE_OTHER): Payer: Medicare Other | Admitting: *Deleted

## 2017-03-17 DIAGNOSIS — I441 Atrioventricular block, second degree: Secondary | ICD-10-CM | POA: Diagnosis not present

## 2017-03-17 NOTE — Progress Notes (Signed)
Remote pacemaker transmission.   

## 2017-03-20 ENCOUNTER — Encounter: Payer: Self-pay | Admitting: Cardiology

## 2017-03-20 NOTE — Progress Notes (Signed)
Letter  

## 2017-04-03 ENCOUNTER — Other Ambulatory Visit: Payer: Self-pay

## 2017-04-03 MED ORDER — CLONIDINE HCL 0.3 MG PO TABS: 0.3000 mg | ORAL_TABLET | Freq: Three times a day (TID) | ORAL | 6 refills | Status: DC

## 2017-04-07 LAB — CUP PACEART REMOTE DEVICE CHECK
Battery Remaining Longevity: 116 mo
Battery Remaining Percentage: 95.5 %
Battery Voltage: 3.01 V
Brady Statistic AP VP Percent: 57 %
Brady Statistic AP VS Percent: 5 %
Brady Statistic AS VP Percent: 4.6 %
Brady Statistic AS VS Percent: 33 %
Brady Statistic RA Percent Paced: 62 %
Brady Statistic RV Percent Paced: 62 %
Date Time Interrogation Session: 20181009080013
Implantable Lead Implant Date: 20061201
Implantable Lead Implant Date: 20061201
Implantable Lead Location: 753859
Implantable Lead Location: 753860
Implantable Pulse Generator Implant Date: 20160614
Lead Channel Impedance Value: 490 Ohm
Lead Channel Impedance Value: 590 Ohm
Lead Channel Pacing Threshold Amplitude: 0.375 V
Lead Channel Pacing Threshold Amplitude: 0.75 V
Lead Channel Pacing Threshold Pulse Width: 0.5 ms
Lead Channel Pacing Threshold Pulse Width: 0.5 ms
Lead Channel Sensing Intrinsic Amplitude: 1.5 mV
Lead Channel Sensing Intrinsic Amplitude: 12 mV
Lead Channel Setting Pacing Amplitude: 2 V
Lead Channel Setting Pacing Amplitude: 2 V
Lead Channel Setting Pacing Pulse Width: 0.5 ms
Lead Channel Setting Sensing Sensitivity: 2 mV
Pulse Gen Model: 2240
Pulse Gen Serial Number: 7768207

## 2017-04-08 ENCOUNTER — Other Ambulatory Visit: Payer: Self-pay

## 2017-04-22 ENCOUNTER — Encounter: Payer: Self-pay | Admitting: Cardiovascular Disease

## 2017-04-22 ENCOUNTER — Ambulatory Visit (INDEPENDENT_AMBULATORY_CARE_PROVIDER_SITE_OTHER): Payer: Medicare Other | Admitting: Cardiovascular Disease

## 2017-04-22 VITALS — BP 150/70 | HR 60 | Ht 69.0 in | Wt 196.0 lb

## 2017-04-22 DIAGNOSIS — Z950 Presence of cardiac pacemaker: Secondary | ICD-10-CM

## 2017-04-22 DIAGNOSIS — I10 Essential (primary) hypertension: Secondary | ICD-10-CM

## 2017-04-22 DIAGNOSIS — I471 Supraventricular tachycardia: Secondary | ICD-10-CM | POA: Diagnosis not present

## 2017-04-22 DIAGNOSIS — E119 Type 2 diabetes mellitus without complications: Secondary | ICD-10-CM

## 2017-04-22 DIAGNOSIS — Z8679 Personal history of other diseases of the circulatory system: Secondary | ICD-10-CM | POA: Diagnosis not present

## 2017-04-22 DIAGNOSIS — I441 Atrioventricular block, second degree: Secondary | ICD-10-CM | POA: Diagnosis not present

## 2017-04-22 DIAGNOSIS — E782 Mixed hyperlipidemia: Secondary | ICD-10-CM | POA: Diagnosis not present

## 2017-04-22 NOTE — Progress Notes (Addendum)
Cardiology Office Note    Date:  04/22/2017   ID:  Richard Alvarado, DOB 1940/11/23, MRN [id number]  PCP:  Richard Du, MD  Cardiologist:   Richard Klein, MD   Chief Complaint  Patient presents with  . Follow-up    History of Present Illness:  Richard Alvarado is a 76 y.o. male with second-degree atrioventricular block returning for pacemaker follow-up as well as hypertension, hyperlipidemia, DM and history of remote stroke. He had the pacemaker initially implanted in 2006 and underwent a pacemaker generator change out in June 2016. He has not had any significant health problems since that time.  The patient specifically denies any chest pain at rest exertion, dyspnea at rest or with exertion, orthopnea, paroxysmal nocturnal dyspnea, syncope, palpitations, focal neurological deficits, intermittent claudication, lower extremity edema, unexplained weight gain, cough, hemoptysis or wheezing.   Pacemaker interrogation shows normal device function with estimated generator longevity of 6-10 years. The broad range reported this is due to noise reversion with increase in atrial pacing to high output. It's not clear what caused the noise reversion. Electrograms were not turned "on". Other than that there is normal pacemaker function. He has 26% atrial pacing and 16% ventricular pacing. The presenting rhythm was atrial paced ventricular sensed today. 6 episodes of mode switch are recorded all them extremely brief. There is no atrial fibrillation.  He has a long-standing history of very severe and hard to control systemic hypertension. At one point he was taking minoxidil but this was stopped for a pericardial effusion. 20 years ago he had a left brain stroke from which she has recovered without sequelae.  Pertinent negatives include the absence of coronary insufficiency by nuclear stress testing and the absence of renal artery stenosis by ultrasonography.    Past Medical History:    Diagnosis Date  . Cancer [facility]) 2002   prostate  . Cerebral atherosclerosis    CAROTID DOPPLER, 09/07/2009 - RIGHT AND LEFT CCAs-small-moderate amount of irregular mixed density plaque, no significant evidence of diameter reduction; RIGHT AND LEFT ICAs-moderate amount irregular mixed density plaque, no evidence of significant diameter reduction  . CVA (cerebral vascular accident) ([place]) 1994   Left brain  . Diabetes mellitus without complication ([place])   . History of CVA (cerebrovascular accident) 04/24/2013  . Hypercholesterolemia   . Hypertension    RENAL DOPPLER, 03/08/2009 - normal  . Hypertension in pregnancy, preeclampsia, severe 04/24/2013  . Obstructive sleep apnea 09/26/2005   AHI-19.46, during REM-36.88  . Pacemaker St. Jude victory, dual chamber, 2006 04/24/2013  . Pericardial effusion    2D ECHO, 03/07/2011 - EF >70%, normal  . Second degree heart block 04/24/2013    Past Surgical History:  Procedure Laterality Date  . NM MYOVIEW LTD  03/31/2012   Normal study, no ECG changes, EKG negative for ischemia, post-stress EF 54%  . PACEMAKER INSERTION  05/09/2005   St. Jude [place], [place] #[id number], serial [id number], replaced 2016  . PROSTATE SURGERY  2002   removed- cancer    Current Medications: Outpatient Medications Prior to Visit  Medication Sig Dispense Refill  . amLODipine (NORVASC) 10 MG tablet Take 1 tablet (10 mg total) by mouth daily. 30 tablet 10  . atorvastatin (LIPITOR) 80 MG tablet Take 80 mg by mouth at bedtime.    Marland Kitchen BYSTOLIC 20 MG TABS Take 1 tablet daily by mouth.  12  . cloNIDine (CATAPRES) 0.3 MG tablet Take 1 tablet (0.3 mg total) by mouth 3 (three) times daily. 90 tablet  6  . furosemide (LASIX) 40 MG tablet Take 1 tablet (40 mg total) by mouth daily. 30 tablet 0  . glipiZIDE (GLUCOTROL) 5 MG tablet Take 1 tablet (5 mg total) by mouth 2 (two) times daily before a meal. 60 tablet 0  . losartan (COZAAR) 100 MG tablet Take 1 tablet (100 mg total) by mouth 2 (two)  times daily. 60 tablet 0  . metFORMIN (GLUCOPHAGE) 1000 MG tablet Take 1 tablet (1,000 mg total) by mouth 2 (two) times daily with a meal. 60 tablet 0  . pantoprazole (PROTONIX) 40 MG tablet Take 1 tablet (40 mg total) by mouth daily. 30 tablet 0  . senna (SENOKOT) 8.6 MG tablet Take 1 tablet by mouth daily as needed for constipation.    Marland Kitchen BYSTOLIC 10 MG tablet TAKE 1 TABLET (10 MG TOTAL) BY MOUTH DAILY. 30 tablet 0   No facility-administered medications prior to visit.      Allergies:   Patient has no known allergies.   Social History   Socioeconomic History  . Marital status: Married    Spouse name: Richard Alvarado  . Number of children: 1  . Years of education: 38  . Highest education level: None  Social Needs  . Financial resource strain: None  . Food insecurity - worry: None  . Food insecurity - inability: None  . Transportation needs - medical: None  . Transportation needs - non-medical: None  Occupational History    Comment: Interior and spatial designer, retired Garment/textile technologist  Tobacco Use  . Smoking status: Former Smoker    Last attempt to quit: 06/09/1999    Years since quitting: 17.8  . Smokeless tobacco: Never Used  Substance and Sexual Activity  . Alcohol use: No  . Drug use: No  . Sexual activity: None  Other Topics Concern  . None  Social History Narrative   Lives with wife   Caffeine free coffee only     Family History:  The patient's family history includes CVA in his mother; Heart attack in his father; Heart disease in his maternal grandmother; Hypertension in his sister; Stroke in his mother.   ROS:   Please see the history of present illness.    ROS All other systems reviewed and are negative.   PHYSICAL EXAM:   VS:  BP (!) 150/70   Pulse 60   Ht 5\' 9"  (1.753 m)   Wt 196 lb (88.9 kg)   BMI 28.94 kg/m    GEN: Well nourished, well developed, in no acute distress  HEENT: normal  Neck: no JVD, carotid bruits, or masses Cardiac: RRR; no murmurs, rubs, or gallops,no edema ,  healthy left subclavian pacemaker site Respiratory:  clear to auscultation bilaterally, normal work of breathing GI: soft, nontender, nondistended, + BS MS: no deformity or atrophy  Skin: warm and dry, no rash Neuro:  Alert and Oriented x 3, Strength and sensation are intact Psych: euthymic mood, full affect  Wt Readings from Last 3 Encounters:  04/22/17 196 lb (88.9 kg)  05/21/16 192 lb (87.1 kg)  04/10/16 194 lb 9.6 oz (88.3 kg)      Studies/Labs Reviewed:   EKG:  EKG is ordered today.  It shows atrial paced, ventricular paced rhythm  Recent Labs: No results found for requested labs within last 8760 hours.   Lipid Panel    Component Value Date/Time   CHOL 111 03/22/2016 0528   TRIG 43 03/22/2016 0528   HDL 34 (L) 03/22/2016 0528   CHOLHDL 3.3 03/22/2016 0528  VLDL 9 03/22/2016 0528   LDLCALC 68 03/22/2016 0528     ASSESSMENT:    1. PAT (paroxysmal atrial tachycardia) ([place])   2. Second degree heart block   3. Pacemaker St. Jude dual chamber, 2006, gen change 2018   4. Essential hypertension   5. Mixed hyperlipidemia   6. Diabetes mellitus type 2 in nonobese (HCC)   7. History of intracranial hemorrhage      PLAN:  In order of problems listed above:  1. Second degree AV block: He is not pacemaker dependent.  Atrial pacing and ventricular pacing have both increased substantially compared to last year, likely due to use of higher doses of clonidine and beta-blockers. 2. PM: Normal device function by comprehensive check in the office today. Remote download in 3 months and office visit yearly. 3. PAT: This is infrequent and brief and asymptomatic.  He is already on beta-blockers.  No additional treatment is necessary. 4. HTN: Good control but requiring maximum doses of amlodipine, ARB, clonidine as well as relatively high dose of Bystolic and a loop diuretic.  Reviewed the importance of compliance with medications, especially the risk of rebound hypertension with  interruption of clonidine or beta-blocker.  He has a history of pericardial effusion secondary to minoxidil 5. HLP: He is due a lipid profile.  He thinks he had this done about 3 weeks ago with his PCP.  We will try to retrieve those results, otherwise will order them.  He is on maximum dose atorvastatin and his LDL cholesterol was 68 a year ago. 6. DM: He reports good control.  A year ago hemoglobin A1c was 6.9%.  I do not have more recent results. 7. Remote ischemic CVA and recent ICH: No sequelae. Has not had atrial fibrillation detected by his device. Previous carotid duplex ultrasound did not show significant obstruction.  It seems he has recovered without sequelae from the most recent event.    Medication Adjustments/Labs and Tests Ordered: Current medicines are reviewed at length with the patient today.  Concerns regarding medicines are outlined above.  Medication changes, Labs and Tests ordered today are listed in the Patient Instructions below. Patient Instructions  Dr Sallyanne Kuster recommends that you continue on your current medications as directed. Please refer to the Current Medication list given to you today.  Remote monitoring is used to monitor your Pacemaker or ICD from home. This monitoring reduces the number of office visits required to check your device to one time per year. It allows [place] to keep an eye on the functioning of your device to ensure it is working properly. You are scheduled for a device check from home on Tuesday, January 8th, 2019. You may send your transmission at any time that day. If you have a wireless device, the transmission will be sent automatically. After your physician reviews your transmission, you will receive a notification with your next transmission date.  Dr Sallyanne Kuster recommends that you schedule a follow-up appointment in 12 months with a pacemaker check. You will receive a reminder letter in the mail two months in advance. If you don't receive a letter,  please call our office to schedule the follow-up appointment.  If you need a refill on your cardiac medications before your next appointment, please call your pharmacy.    Signed, Richard Klein, MD  04/22/2017 9:42 AM    [facility] [address], [place], [place]  [address] Phone: [phone]; Fax: [phone]

## 2017-04-22 NOTE — Patient Instructions (Signed)

## 2017-06-03 LAB — CUP PACEART INCLINIC DEVICE CHECK
Battery Remaining Longevity: 118 mo
Battery Voltage: 3.01 V
Brady Statistic RA Percent Paced: 61 %
Brady Statistic RV Percent Paced: 61 %
Date Time Interrogation Session: 20181114150917
HighPow Impedance: 41.625
Implantable Lead Implant Date: 20061201
Implantable Lead Implant Date: 20061201
Implantable Lead Location: 753859
Implantable Lead Location: 753860
Implantable Pulse Generator Implant Date: 20160614
Lead Channel Impedance Value: 462.5 Ohm
Lead Channel Impedance Value: 475 Ohm
Lead Channel Impedance Value: 587.5 Ohm
Lead Channel Pacing Threshold Amplitude: 0.5 V
Lead Channel Pacing Threshold Amplitude: 0.5 V
Lead Channel Pacing Threshold Amplitude: 0.75 V
Lead Channel Pacing Threshold Amplitude: 0.75 V
Lead Channel Pacing Threshold Amplitude: 1 V
Lead Channel Pacing Threshold Amplitude: 1 V
Lead Channel Pacing Threshold Pulse Width: 0.5 ms
Lead Channel Pacing Threshold Pulse Width: 0.5 ms
Lead Channel Pacing Threshold Pulse Width: 0.5 ms
Lead Channel Pacing Threshold Pulse Width: 0.5 ms
Lead Channel Pacing Threshold Pulse Width: 0.5 ms
Lead Channel Pacing Threshold Pulse Width: 0.5 ms
Lead Channel Sensing Intrinsic Amplitude: 1.1 mV
Lead Channel Sensing Intrinsic Amplitude: 11.9 mV
Lead Channel Sensing Intrinsic Amplitude: 12 mV
Lead Channel Setting Pacing Amplitude: 2 V
Lead Channel Setting Pacing Amplitude: 2 V
Lead Channel Setting Pacing Pulse Width: 0.5 ms
Lead Channel Setting Sensing Sensitivity: 2 mV
Pulse Gen Model: 2240
Pulse Gen Serial Number: 7768207

## 2017-06-16 ENCOUNTER — Ambulatory Visit (INDEPENDENT_AMBULATORY_CARE_PROVIDER_SITE_OTHER): Payer: Medicare Other | Admitting: *Deleted

## 2017-06-16 ENCOUNTER — Telehealth: Payer: Self-pay | Admitting: Cardiology

## 2017-06-16 DIAGNOSIS — I441 Atrioventricular block, second degree: Secondary | ICD-10-CM

## 2017-06-16 NOTE — Telephone Encounter (Signed)
LMOVM reminding pt to send remote transmission.   

## 2017-06-17 ENCOUNTER — Encounter: Payer: Self-pay | Admitting: Cardiology

## 2017-06-17 NOTE — Progress Notes (Signed)
Remote pacemaker transmission.   

## 2017-06-18 DIAGNOSIS — I2510 Atherosclerotic heart disease of native coronary artery without angina pectoris: Secondary | ICD-10-CM | POA: Diagnosis not present

## 2017-06-18 DIAGNOSIS — I10 Essential (primary) hypertension: Secondary | ICD-10-CM | POA: Diagnosis not present

## 2017-06-18 DIAGNOSIS — E785 Hyperlipidemia, unspecified: Secondary | ICD-10-CM | POA: Diagnosis not present

## 2017-06-18 DIAGNOSIS — I69354 Hemiplegia and hemiparesis following cerebral infarction affecting left non-dominant side: Secondary | ICD-10-CM | POA: Diagnosis not present

## 2017-06-18 DIAGNOSIS — E1165 Type 2 diabetes mellitus with hyperglycemia: Secondary | ICD-10-CM | POA: Diagnosis not present

## 2017-06-22 LAB — CUP PACEART REMOTE DEVICE CHECK
Battery Remaining Longevity: 116 mo
Battery Remaining Percentage: 95.5 %
Battery Voltage: 2.99 V
Brady Statistic AP VP Percent: 47 %
Brady Statistic AP VS Percent: 5 %
Brady Statistic AS VP Percent: 3.5 %
Brady Statistic AS VS Percent: 44 %
Brady Statistic RA Percent Paced: 52 %
Brady Statistic RV Percent Paced: 51 %
Date Time Interrogation Session: 20190108173912
Implantable Lead Implant Date: 20061201
Implantable Lead Implant Date: 20061201
Implantable Lead Location: 753859
Implantable Lead Location: 753860
Implantable Pulse Generator Implant Date: 20160614
Lead Channel Impedance Value: 450 Ohm
Lead Channel Impedance Value: 550 Ohm
Lead Channel Pacing Threshold Amplitude: 0.25 V
Lead Channel Pacing Threshold Amplitude: 1 V
Lead Channel Pacing Threshold Pulse Width: 0.5 ms
Lead Channel Pacing Threshold Pulse Width: 0.5 ms
Lead Channel Sensing Intrinsic Amplitude: 1 mV
Lead Channel Sensing Intrinsic Amplitude: 12 mV
Lead Channel Setting Pacing Amplitude: 2 V
Lead Channel Setting Pacing Amplitude: 2 V
Lead Channel Setting Pacing Pulse Width: 0.5 ms
Lead Channel Setting Sensing Sensitivity: 2 mV
Pulse Gen Model: 2240
Pulse Gen Serial Number: 7768207

## 2017-09-04 ENCOUNTER — Other Ambulatory Visit: Payer: Self-pay | Admitting: Physician Assistant

## 2017-09-15 ENCOUNTER — Ambulatory Visit (INDEPENDENT_AMBULATORY_CARE_PROVIDER_SITE_OTHER): Payer: Medicare Other | Admitting: *Deleted

## 2017-09-15 ENCOUNTER — Telehealth: Payer: Self-pay | Admitting: Cardiology

## 2017-09-15 DIAGNOSIS — I441 Atrioventricular block, second degree: Secondary | ICD-10-CM

## 2017-09-15 NOTE — Telephone Encounter (Signed)
Confirmed remote transmission w/ pt wife.   

## 2017-09-15 NOTE — Progress Notes (Signed)
Remote pacemaker transmission.   

## 2017-09-16 DIAGNOSIS — I2510 Atherosclerotic heart disease of native coronary artery without angina pectoris: Secondary | ICD-10-CM | POA: Diagnosis not present

## 2017-09-16 DIAGNOSIS — I69354 Hemiplegia and hemiparesis following cerebral infarction affecting left non-dominant side: Secondary | ICD-10-CM | POA: Diagnosis not present

## 2017-09-16 DIAGNOSIS — I10 Essential (primary) hypertension: Secondary | ICD-10-CM | POA: Diagnosis not present

## 2017-09-16 DIAGNOSIS — E1169 Type 2 diabetes mellitus with other specified complication: Secondary | ICD-10-CM | POA: Diagnosis not present

## 2017-09-17 ENCOUNTER — Encounter: Payer: Self-pay | Admitting: Cardiology

## 2017-10-14 ENCOUNTER — Telehealth: Payer: Self-pay

## 2017-10-14 LAB — CUP PACEART REMOTE DEVICE CHECK
Battery Remaining Longevity: 116 mo
Battery Remaining Percentage: 95.5 %
Battery Voltage: 2.99 V
Brady Statistic AP VP Percent: 48 %
Brady Statistic AP VS Percent: 4.9 %
Brady Statistic AS VP Percent: 4.8 %
Brady Statistic AS VS Percent: 42 %
Brady Statistic RA Percent Paced: 53 %
Brady Statistic RV Percent Paced: 53 %
Date Time Interrogation Session: 20190409181741
Implantable Lead Implant Date: 20061201
Implantable Lead Implant Date: 20061201
Implantable Lead Location: 753859
Implantable Lead Location: 753860
Implantable Pulse Generator Implant Date: 20160614
Lead Channel Impedance Value: 460 Ohm
Lead Channel Impedance Value: 550 Ohm
Lead Channel Pacing Threshold Amplitude: 0.5 V
Lead Channel Pacing Threshold Amplitude: 1 V
Lead Channel Pacing Threshold Pulse Width: 0.5 ms
Lead Channel Pacing Threshold Pulse Width: 0.5 ms
Lead Channel Sensing Intrinsic Amplitude: 1.4 mV
Lead Channel Sensing Intrinsic Amplitude: 12 mV
Lead Channel Setting Pacing Amplitude: 2 V
Lead Channel Setting Pacing Amplitude: 2 V
Lead Channel Setting Pacing Pulse Width: 0.5 ms
Lead Channel Setting Sensing Sensitivity: 2 mV
Pulse Gen Model: 2240
Pulse Gen Serial Number: 7768207

## 2017-10-14 NOTE — Telephone Encounter (Signed)
Spoke with patient and reviewed remote transmission for a notable sudden increase in magnet responses. Patient was unable to identify any new appliances or equipment. Patient will send an additional remote transmission to identify if these magnet responses are continuing.

## 2017-10-14 NOTE — Telephone Encounter (Signed)
Reviewed second transmission with Wife per DPR - transmission shows continued magnet responses. Richard Alvarado contacted - will plan to make a house visit to better identify the cause of the magnet responses. Patient's wife aware to expect call from [place].

## 2017-10-16 ENCOUNTER — Telehealth: Payer: Self-pay

## 2017-10-16 NOTE — Telephone Encounter (Signed)
Spoke with [employer] rep Karilyn Cota who reported that when patient pushes button for key fob to start his car the magnet response is triggered. The magnet response has been turned off, a note has been put in the patients device and [facility] is aware. I will add a note to paceart. Patient is aware that if he needs any surgical procedures he should request a device rep to be present. [employer] will be notified by industry rep for further investigation.

## 2017-10-16 NOTE — Telephone Encounter (Signed)
Patient's wife called stating that they bought a new car back in February, and they have to use a magnetic key fob to start the ignition. Patient's wife wanted to see if this may be causing the magnet response episodes. She states that patient normally keeps the key in his pants pocket, but has rarely placed it in his shirt pocket. I informed wife that the key should not cause the episodes if it's mostly kept in his pants pocket. Wife verbalized understanding.  I assured her that the object is not harming the pacemaker or the patient. Wife verbalized understanding and expressed that she still wanted a [employer] representative to come to their home and find the cause.  Bryan from [facility] notified and plans to reach out to patient/wife.

## 2017-12-08 ENCOUNTER — Ambulatory Visit (INDEPENDENT_AMBULATORY_CARE_PROVIDER_SITE_OTHER): Payer: Medicare Other | Admitting: Urology

## 2017-12-08 DIAGNOSIS — N5231 Erectile dysfunction following radical prostatectomy: Secondary | ICD-10-CM | POA: Diagnosis not present

## 2017-12-08 DIAGNOSIS — Z8546 Personal history of malignant neoplasm of prostate: Secondary | ICD-10-CM

## 2017-12-15 ENCOUNTER — Ambulatory Visit (INDEPENDENT_AMBULATORY_CARE_PROVIDER_SITE_OTHER): Payer: Medicare Other | Admitting: *Deleted

## 2017-12-15 DIAGNOSIS — R55 Syncope and collapse: Secondary | ICD-10-CM | POA: Diagnosis not present

## 2017-12-15 NOTE — Progress Notes (Signed)
Remote pacemaker transmission.   

## 2017-12-16 DIAGNOSIS — Z0000 Encounter for general adult medical examination without abnormal findings: Secondary | ICD-10-CM | POA: Diagnosis not present

## 2017-12-17 ENCOUNTER — Encounter: Payer: Self-pay | Admitting: Pulmonary Disease

## 2017-12-17 DIAGNOSIS — G4730 Sleep apnea, unspecified: Secondary | ICD-10-CM | POA: Diagnosis not present

## 2017-12-17 DIAGNOSIS — E1169 Type 2 diabetes mellitus with other specified complication: Secondary | ICD-10-CM | POA: Diagnosis not present

## 2017-12-17 DIAGNOSIS — E1165 Type 2 diabetes mellitus with hyperglycemia: Secondary | ICD-10-CM | POA: Diagnosis not present

## 2017-12-17 DIAGNOSIS — I2510 Atherosclerotic heart disease of native coronary artery without angina pectoris: Secondary | ICD-10-CM | POA: Diagnosis not present

## 2017-12-17 DIAGNOSIS — I10 Essential (primary) hypertension: Secondary | ICD-10-CM | POA: Diagnosis not present

## 2017-12-17 DIAGNOSIS — E785 Hyperlipidemia, unspecified: Secondary | ICD-10-CM | POA: Diagnosis not present

## 2017-12-17 DIAGNOSIS — Z0000 Encounter for general adult medical examination without abnormal findings: Secondary | ICD-10-CM | POA: Diagnosis not present

## 2017-12-17 DIAGNOSIS — C61 Malignant neoplasm of prostate: Secondary | ICD-10-CM | POA: Diagnosis not present

## 2017-12-17 DIAGNOSIS — R739 Hyperglycemia, unspecified: Secondary | ICD-10-CM | POA: Diagnosis not present

## 2017-12-17 DIAGNOSIS — I69354 Hemiplegia and hemiparesis following cerebral infarction affecting left non-dominant side: Secondary | ICD-10-CM | POA: Diagnosis not present

## 2017-12-17 LAB — MICROALBUMIN, URINE: Microalb, Ur: 1.1

## 2017-12-20 ENCOUNTER — Other Ambulatory Visit: Payer: Self-pay | Admitting: Physician Assistant

## 2017-12-21 NOTE — Telephone Encounter (Signed)
Rx sent to pharmacy   

## 2017-12-29 LAB — CUP PACEART REMOTE DEVICE CHECK
Battery Remaining Longevity: 115 mo
Battery Remaining Percentage: 95.5 %
Battery Voltage: 2.99 V
Brady Statistic AP VP Percent: 59 %
Brady Statistic AP VS Percent: 3.6 %
Brady Statistic AS VP Percent: 5.2 %
Brady Statistic AS VS Percent: 32 %
Brady Statistic RA Percent Paced: 63 %
Brady Statistic RV Percent Paced: 64 %
Date Time Interrogation Session: 20190709080014
Implantable Lead Implant Date: 20061201
Implantable Lead Implant Date: 20061201
Implantable Lead Location: 753859
Implantable Lead Location: 753860
Implantable Pulse Generator Implant Date: 20160614
Lead Channel Impedance Value: 480 Ohm
Lead Channel Impedance Value: 590 Ohm
Lead Channel Pacing Threshold Amplitude: 0.375 V
Lead Channel Pacing Threshold Amplitude: 1 V
Lead Channel Pacing Threshold Pulse Width: 0.5 ms
Lead Channel Pacing Threshold Pulse Width: 0.5 ms
Lead Channel Sensing Intrinsic Amplitude: 0.9 mV
Lead Channel Sensing Intrinsic Amplitude: 10.9 mV
Lead Channel Setting Pacing Amplitude: 2 V
Lead Channel Setting Pacing Amplitude: 2 V
Lead Channel Setting Pacing Pulse Width: 0.5 ms
Lead Channel Setting Sensing Sensitivity: 2 mV
Pulse Gen Model: 2240
Pulse Gen Serial Number: 7768207

## 2017-12-30 ENCOUNTER — Other Ambulatory Visit: Payer: Self-pay | Admitting: Cardiovascular Disease

## 2018-02-25 ENCOUNTER — Other Ambulatory Visit: Payer: Self-pay | Admitting: Physician Assistant

## 2018-03-16 ENCOUNTER — Ambulatory Visit (INDEPENDENT_AMBULATORY_CARE_PROVIDER_SITE_OTHER): Payer: Medicare Other | Admitting: *Deleted

## 2018-03-16 DIAGNOSIS — R55 Syncope and collapse: Secondary | ICD-10-CM

## 2018-03-16 DIAGNOSIS — I441 Atrioventricular block, second degree: Secondary | ICD-10-CM

## 2018-03-17 NOTE — Progress Notes (Signed)
Remote pacemaker transmission.   

## 2018-03-18 DIAGNOSIS — I2510 Atherosclerotic heart disease of native coronary artery without angina pectoris: Secondary | ICD-10-CM | POA: Diagnosis not present

## 2018-03-18 DIAGNOSIS — Z23 Encounter for immunization: Secondary | ICD-10-CM | POA: Diagnosis not present

## 2018-03-18 DIAGNOSIS — I10 Essential (primary) hypertension: Secondary | ICD-10-CM | POA: Diagnosis not present

## 2018-03-18 DIAGNOSIS — I69354 Hemiplegia and hemiparesis following cerebral infarction affecting left non-dominant side: Secondary | ICD-10-CM | POA: Diagnosis not present

## 2018-03-18 DIAGNOSIS — E1169 Type 2 diabetes mellitus with other specified complication: Secondary | ICD-10-CM | POA: Diagnosis not present

## 2018-03-24 ENCOUNTER — Encounter: Payer: Self-pay | Admitting: Cardiology

## 2018-04-30 LAB — CUP PACEART REMOTE DEVICE CHECK
Battery Remaining Longevity: 117 mo
Battery Remaining Percentage: 95.5 %
Battery Voltage: 2.99 V
Brady Statistic AP VP Percent: 52 %
Brady Statistic AP VS Percent: 3.7 %
Brady Statistic AS VP Percent: 5.3 %
Brady Statistic AS VS Percent: 39 %
Brady Statistic RA Percent Paced: 55 %
Brady Statistic RV Percent Paced: 57 %
Date Time Interrogation Session: 20191008080017
Implantable Lead Implant Date: 20061201
Implantable Lead Implant Date: 20061201
Implantable Lead Location: 753859
Implantable Lead Location: 753860
Implantable Pulse Generator Implant Date: 20160614
Lead Channel Impedance Value: 490 Ohm
Lead Channel Impedance Value: 580 Ohm
Lead Channel Pacing Threshold Amplitude: 0.375 V
Lead Channel Pacing Threshold Amplitude: 1 V
Lead Channel Pacing Threshold Pulse Width: 0.5 ms
Lead Channel Pacing Threshold Pulse Width: 0.5 ms
Lead Channel Sensing Intrinsic Amplitude: 0.9 mV
Lead Channel Sensing Intrinsic Amplitude: 12 mV
Lead Channel Setting Pacing Amplitude: 2 V
Lead Channel Setting Pacing Amplitude: 2 V
Lead Channel Setting Pacing Pulse Width: 0.5 ms
Lead Channel Setting Sensing Sensitivity: 2 mV
Pulse Gen Model: 2240
Pulse Gen Serial Number: 7768207

## 2018-05-05 ENCOUNTER — Encounter: Payer: Self-pay | Admitting: Cardiovascular Disease

## 2018-05-05 ENCOUNTER — Ambulatory Visit (INDEPENDENT_AMBULATORY_CARE_PROVIDER_SITE_OTHER): Payer: Medicare Other | Admitting: Cardiovascular Disease

## 2018-05-05 VITALS — BP 142/86 | HR 60 | Ht 69.0 in | Wt 195.0 lb

## 2018-05-05 DIAGNOSIS — Z8673 Personal history of transient ischemic attack (TIA), and cerebral infarction without residual deficits: Secondary | ICD-10-CM | POA: Diagnosis not present

## 2018-05-05 DIAGNOSIS — I471 Supraventricular tachycardia: Secondary | ICD-10-CM

## 2018-05-05 DIAGNOSIS — I10 Essential (primary) hypertension: Secondary | ICD-10-CM

## 2018-05-05 DIAGNOSIS — I441 Atrioventricular block, second degree: Secondary | ICD-10-CM | POA: Diagnosis not present

## 2018-05-05 DIAGNOSIS — E7800 Pure hypercholesterolemia, unspecified: Secondary | ICD-10-CM | POA: Diagnosis not present

## 2018-05-05 DIAGNOSIS — Z950 Presence of cardiac pacemaker: Secondary | ICD-10-CM | POA: Diagnosis not present

## 2018-05-05 DIAGNOSIS — I672 Cerebral atherosclerosis: Secondary | ICD-10-CM | POA: Diagnosis not present

## 2018-05-05 DIAGNOSIS — E119 Type 2 diabetes mellitus without complications: Secondary | ICD-10-CM

## 2018-05-05 DIAGNOSIS — Z8679 Personal history of other diseases of the circulatory system: Secondary | ICD-10-CM | POA: Diagnosis not present

## 2018-05-05 NOTE — Progress Notes (Signed)
Cardiology Office Note    Date:  05/05/2018   ID:  Richard AVERSA, DOB 07/17/1940, MRN [id number]  PCP:  [name], MD  Cardiologist:   [name], MD   Chief Complaint  Patient presents with  . Pacemaker Check    History of Present Illness:  Richard Alvarado is a 77 y.o. male with second-degree atrioventricular block returning for pacemaker follow-up. He has hypertension, hyperlipidemia, DM and history of remote stroke. He had the pacemaker initially implanted in 2006 and underwent a pacemaker generator change out in June 2016 ([place]). He has not had any significant health problems since that time.  The patient specifically denies any chest pain at rest exertion, dyspnea at rest or with exertion, orthopnea, paroxysmal nocturnal dyspnea, syncope, palpitations, focal neurological deficits, intermittent claudication, lower extremity edema, unexplained weight gain, cough, hemoptysis or wheezing.   Pacemaker interrogation shows normal device function with estimated generator longevity of 9.4-10.0 years.  He now has 54% atrial pacing and we turned rate response on today.  There is 57% ventricular pacing.  As in the past he has rare episodes of brief paroxysmal atrial tachycardia lasting a maximum of 12 seconds these appear to be asymptomatic.  He has a long-standing history of very severe and hard to control systemic hypertension. At one point he was taking minoxidil but this was stopped for a pericardial effusion. 20 years ago he had a left brain stroke from which she has recovered without sequelae. In October 2017 he had hemorrhage in the right posterior basal ganglia.  CT of the head shows extensive intracranial atherosclerosis with dense calcification of the carotid siphons, suspected at least moderate stenosis of the right vertebral artery origin, but no areas of severe stenosis in the circle of Willis.  Pertinent negatives include the absence of coronary  insufficiency by nuclear stress testing and the absence of renal artery stenosis by ultrasonography.    Past Medical History:  Diagnosis Date  . Cancer [facility]) 2002   prostate  . Cerebral atherosclerosis    CAROTID DOPPLER, 09/07/2009 - RIGHT AND LEFT CCAs-small-moderate amount of irregular mixed density plaque, no significant evidence of diameter reduction; RIGHT AND LEFT ICAs-moderate amount irregular mixed density plaque, no evidence of significant diameter reduction  . CVA (cerebral vascular accident) ([place]) 1994   Left brain  . Diabetes mellitus without complication ([place])   . History of CVA (cerebrovascular accident) 04/24/2013  . Hypercholesterolemia   . Hypertension    RENAL DOPPLER, 03/08/2009 - normal  . Hypertension in pregnancy, preeclampsia, severe 04/24/2013  . Obstructive sleep apnea 09/26/2005   AHI-19.46, during REM-36.88  . Pacemaker St. Jude victory, dual chamber, 2006 04/24/2013  . Pericardial effusion    2D ECHO, 03/07/2011 - EF >70%, normal  . Second degree heart block 04/24/2013    Past Surgical History:  Procedure Laterality Date  . EP IMPLANTABLE DEVICE N/A 11/21/2014   Procedure: PPM/BIV PPM Generator Changeout;  Surgeon: [name], MD;  Location: [place] [facility];  Service: Cardiovascular;  Laterality: N/A;  . NM MYOVIEW LTD  03/31/2012   Normal study, no ECG changes, EKG negative for ischemia, post-stress EF 54%  . PACEMAKER INSERTION  05/09/2005   St. Jude [place], [place] #[id number], serial [phone], replaced 2016  . PROSTATE SURGERY  2002   removed- cancer    Current Medications: Outpatient Medications Prior to Visit  Medication Sig Dispense Refill  . amLODipine (NORVASC) 10 MG tablet TAKE 1 TABLET BY MOUTH EVERY DAY 90  tablet 1  . atorvastatin (LIPITOR) 80 MG tablet Take 80 mg by mouth at bedtime.    Marland Kitchen BYSTOLIC 20 MG TABS Take 1 tablet daily by mouth.  12  . cloNIDine (CATAPRES) 0.3 MG tablet TAKE 1 TABLET BY MOUTH THREE TIMES A DAY 270 tablet 0  .  furosemide (LASIX) 40 MG tablet Take 1 tablet (40 mg total) by mouth daily. 30 tablet 0  . glipiZIDE (GLUCOTROL) 5 MG tablet Take 1 tablet (5 mg total) by mouth 2 (two) times daily before a meal. 60 tablet 0  . losartan (COZAAR) 100 MG tablet Take 1 tablet (100 mg total) by mouth 2 (two) times daily. 60 tablet 0  . metFORMIN (GLUCOPHAGE) 1000 MG tablet Take 1 tablet (1,000 mg total) by mouth 2 (two) times daily with a meal. 60 tablet 0  . pantoprazole (PROTONIX) 40 MG tablet Take 1 tablet (40 mg total) by mouth daily. 30 tablet 0  . senna (SENOKOT) 8.6 MG tablet Take 1 tablet by mouth daily as needed for constipation.     No facility-administered medications prior to visit.      Allergies:   Patient has no known allergies.   Social History   Socioeconomic History  . Marital status: Married    Spouse name: Richard Alvarado  . Number of children: 1  . Years of education: 62  . Highest education level: Not on file  Occupational History    Comment: [employer], retired Garment/textile technologist  Social Needs  . Financial resource strain: Not on file  . Food insecurity:    Worry: Not on file    Inability: Not on file  . Transportation needs:    Medical: Not on file    Non-medical: Not on file  Tobacco Use  . Smoking status: Former Smoker    Last attempt to quit: 06/09/1999    Years since quitting: 18.9  . Smokeless tobacco: Never Used  Substance and Sexual Activity  . Alcohol use: No  . Drug use: No  . Sexual activity: Not on file  Lifestyle  . Physical activity:    Days per week: Not on file    Minutes per session: Not on file  . Stress: Not on file  Relationships  . Social connections:    Talks on phone: Not on file    Gets together: Not on file    Attends religious service: Not on file    Active member of club or organization: Not on file    Attends meetings of clubs or organizations: Not on file    Relationship status: Not on file  Other Topics Concern  . Not on file  Social History  Narrative   Lives with wife   Caffeine free coffee only     Family History:  The patient's family history includes CVA in his mother; Heart attack in his father; Heart disease in his maternal grandmother; Hypertension in his sister; Stroke in his mother.   ROS:   Please see the history of present illness.    ROS All other systems reviewed and are negative.   PHYSICAL EXAM:   VS:  BP (!) 142/86   Pulse 60   Ht 5\' 9"  (1.753 m)   Wt 195 lb (88.5 kg)   SpO2 96%   BMI 28.80 kg/m    GEN: Well nourished, well developed, in no acute distress  HEENT: normal  Neck: no JVD, carotid bruits, or masses Cardiac: RRR; no murmurs, rubs, or gallops,no edema , healthy left  subclavian pacemaker site Respiratory:  clear to auscultation bilaterally, normal work of breathing GI: soft, nontender, nondistended, + BS MS: no deformity or atrophy  Skin: warm and dry, no rash Neuro:  Alert and Oriented x 3, Strength and sensation are intact Psych: euthymic mood, full affect  Wt Readings from Last 3 Encounters:  05/05/18 195 lb (88.5 kg)  04/22/17 196 lb (88.9 kg)  05/21/16 192 lb (87.1 kg)      Studies/Labs Reviewed:   EKG:  EKG is ordered today.  It shows 100% AV sequential pacing  Recent Labs: No results found for requested labs within last 8760 hours.   Lipid Panel    Component Value Date/Time   CHOL 111 03/22/2016 0528   TRIG 43 03/22/2016 0528   HDL 34 (L) 03/22/2016 0528   CHOLHDL 3.3 03/22/2016 0528   VLDL 9 03/22/2016 0528   LDLCALC 68 03/22/2016 0528     ASSESSMENT:    1. Second degree heart block   2. PAT (paroxysmal atrial tachycardia) ([place])   3. Pacemaker   4. Essential hypertension   5. Hypercholesterolemia   6. Diabetes mellitus type 2 in nonobese (HCC)   7. History of arterial ischemic stroke   8. History of intracranial hemorrhage   9. Intracranial atherosclerosis      PLAN:  In order of problems listed above:  1. Second degree AV block: He is not  pacemaker dependent but is requiring relatively frequent ventricular pacing, without evidence of congestive heart failure. 2. PM: Normal device function by comprehensive check in the office today. Remote download in 3 months and office visit yearly.  Rate response was turned on in view of the higher frequency of atrial pacing. 3. PAT: This remains infrequent and asymptomatic.  No true atrial fibrillation is seen.  He is already on beta-blockers.  No additional treatment is necessary. 4. HTN: And his primary care provider's office and at home his blood pressures consistently lower in the 120s.  Good control but requiring maximum doses of amlodipine, ARB, clonidine as well as relatively high dose of Bystolic and a loop diuretic.  Reviewed the importance of compliance with medications, especially the risk of rebound hypertension with interruption of clonidine or beta-blocker.  He has a history of pericardial effusion secondary to minoxidil 5. HLP: The most recent lipid profile that I have available for review was excellent with an LDL of 68.  Usually followed by his PCP. 6. DM: He reports good control. 7. Remote ischemic CVA and recent ICH: No sequelae. Has not had atrial fibrillation detected by his device. Previous carotid duplex ultrasound did not show significant obstruction.  It seems he has recovered without sequelae from the most recent event. 8. Intracranial atherosclerosis: Reviewed results of previous cranial CT angiography 2017.  Avoid excessively rapid reduction in blood pressure.    Medication Adjustments/Labs and Tests Ordered: Current medicines are reviewed at length with the patient today.  Concerns regarding medicines are outlined above.  Medication changes, Labs and Tests ordered today are listed in the Patient Instructions below. Patient Instructions  Medication Instructions:  Dr Sallyanne Kuster recommends that you continue on your current medications as directed. Please refer to the Current  Medication list given to you today.  If you need a refill on your cardiac medications before your next appointment, please call your pharmacy.   Testing/Procedures: Remote monitoring is used to monitor your Pacemaker of ICD from home. This monitoring reduces the number of office visits required to check your device to  one time per year. It allows [place] to keep an eye on the functioning of your device to ensure it is working properly. You are scheduled for a device check from home on Tuesday, January 7th, 2020. You may send your transmission at any time that day. If you have a wireless device, the transmission will be sent automatically. After your physician reviews your transmission, you will receive a postcard with your next transmission date.  Follow-Up: At [facility], you and your health needs are our priority.  As part of our continuing mission to provide you with exceptional heart care, we have created designated Provider Care Teams.  These Care Teams include your primary Cardiologist (physician) and Advanced Practice Providers (APPs -  Physician Assistants and Nurse Practitioners) who all work together to provide you with the care you need, when you need it. You will need a follow up appointment in 12 months.  Please call our office 2 months in advance to schedule this appointment.  You may see [name], MD or one of the following Advanced Practice Providers on your designated Care Team: [place], [place] . Fabian Sharp, PA-C    Signed, [name], MD  05/05/2018 [facility] [place], [place], [place]  [address] Phone: [phone]; Fax: [phone]

## 2018-05-05 NOTE — Patient Instructions (Addendum)
Medication Instructions:  Dr Sallyanne Kuster recommends that you continue on your current medications as directed. Please refer to the Current Medication list given to you today.  If you need a refill on your cardiac medications before your next appointment, please call your pharmacy.   Testing/Procedures: Remote monitoring is used to monitor your Pacemaker of ICD from home. This monitoring reduces the number of office visits required to check your device to one time per year. It allows [place] to keep an eye on the functioning of your device to ensure it is working properly. You are scheduled for a device check from home on Tuesday, January 7th, 2020. You may send your transmission at any time that day. If you have a wireless device, the transmission will be sent automatically. After your physician reviews your transmission, you will receive a postcard with your next transmission date.  Follow-Up: At [facility], you and your health needs are our priority.  As part of our continuing mission to provide you with exceptional heart care, we have created designated Provider Care Teams.  These Care Teams include your primary Cardiologist (physician) and Advanced Practice Providers (APPs -  Physician Assistants and Nurse Practitioners) who all work together to provide you with the care you need, when you need it. You will need a follow up appointment in 12 months.  Please call our office 2 months in advance to schedule this appointment.  You may see [name], MD or one of the following Advanced Practice Providers on your designated Care Team: [place], [place] . Fabian Sharp, PA-C

## 2018-05-24 ENCOUNTER — Other Ambulatory Visit: Payer: Self-pay | Admitting: Physician Assistant

## 2018-05-24 NOTE — Telephone Encounter (Signed)
Rx request sent to pharmacy.  

## 2018-06-15 ENCOUNTER — Ambulatory Visit (INDEPENDENT_AMBULATORY_CARE_PROVIDER_SITE_OTHER): Payer: Medicare Other

## 2018-06-15 DIAGNOSIS — R55 Syncope and collapse: Secondary | ICD-10-CM | POA: Diagnosis not present

## 2018-06-16 ENCOUNTER — Other Ambulatory Visit: Payer: Self-pay | Admitting: Cardiovascular Disease

## 2018-06-16 LAB — CUP PACEART REMOTE DEVICE CHECK
Battery Remaining Longevity: 115 mo
Battery Remaining Percentage: 95.5 %
Battery Voltage: 2.99 V
Brady Statistic AP VP Percent: 49 %
Brady Statistic AP VS Percent: 9.5 %
Brady Statistic AS VP Percent: 6.2 %
Brady Statistic AS VS Percent: 35 %
Brady Statistic RA Percent Paced: 59 %
Brady Statistic RV Percent Paced: 56 %
Date Time Interrogation Session: 20200107090015
Implantable Lead Implant Date: 20061201
Implantable Lead Implant Date: 20061201
Implantable Lead Location: 753859
Implantable Lead Location: 753860
Implantable Pulse Generator Implant Date: 20160614
Lead Channel Impedance Value: 460 Ohm
Lead Channel Impedance Value: 590 Ohm
Lead Channel Pacing Threshold Amplitude: 0.375 V
Lead Channel Pacing Threshold Amplitude: 1 V
Lead Channel Pacing Threshold Pulse Width: 0.5 ms
Lead Channel Pacing Threshold Pulse Width: 0.5 ms
Lead Channel Sensing Intrinsic Amplitude: 0.8 mV
Lead Channel Sensing Intrinsic Amplitude: 12 mV
Lead Channel Setting Pacing Amplitude: 2 V
Lead Channel Setting Pacing Amplitude: 2 V
Lead Channel Setting Pacing Pulse Width: 0.5 ms
Lead Channel Setting Sensing Sensitivity: 2 mV
Pulse Gen Model: 2240
Pulse Gen Serial Number: 7768207

## 2018-06-16 NOTE — Progress Notes (Signed)
Remote pacemaker transmission.   

## 2018-06-18 DIAGNOSIS — I10 Essential (primary) hypertension: Secondary | ICD-10-CM | POA: Diagnosis not present

## 2018-06-18 DIAGNOSIS — E1165 Type 2 diabetes mellitus with hyperglycemia: Secondary | ICD-10-CM | POA: Diagnosis not present

## 2018-06-18 DIAGNOSIS — I69354 Hemiplegia and hemiparesis following cerebral infarction affecting left non-dominant side: Secondary | ICD-10-CM | POA: Diagnosis not present

## 2018-06-18 DIAGNOSIS — I2510 Atherosclerotic heart disease of native coronary artery without angina pectoris: Secondary | ICD-10-CM | POA: Diagnosis not present

## 2018-07-05 LAB — CUP PACEART INCLINIC DEVICE CHECK
Date Time Interrogation Session: 20200127143139
Implantable Lead Implant Date: 20061201
Implantable Lead Implant Date: 20061201
Implantable Lead Location: 753859
Implantable Lead Location: 753860
Implantable Pulse Generator Implant Date: 20160614
Pulse Gen Model: 2240
Pulse Gen Serial Number: 7768207

## 2018-08-15 ENCOUNTER — Other Ambulatory Visit: Payer: Self-pay | Admitting: Cardiovascular Disease

## 2018-09-14 ENCOUNTER — Ambulatory Visit (INDEPENDENT_AMBULATORY_CARE_PROVIDER_SITE_OTHER): Payer: Medicare Other | Admitting: *Deleted

## 2018-09-14 ENCOUNTER — Other Ambulatory Visit: Payer: Self-pay

## 2018-09-14 DIAGNOSIS — I441 Atrioventricular block, second degree: Secondary | ICD-10-CM | POA: Diagnosis not present

## 2018-09-14 LAB — CUP PACEART REMOTE DEVICE CHECK
Battery Remaining Longevity: 114 mo
Battery Remaining Percentage: 95.5 %
Battery Voltage: 2.99 V
Brady Statistic AP VP Percent: 59 %
Brady Statistic AP VS Percent: 7.5 %
Brady Statistic AS VP Percent: 7.5 %
Brady Statistic AS VS Percent: 26 %
Brady Statistic RA Percent Paced: 66 %
Brady Statistic RV Percent Paced: 66 %
Date Time Interrogation Session: 20200407080015
Implantable Lead Implant Date: 20061201
Implantable Lead Implant Date: 20061201
Implantable Lead Location: 753859
Implantable Lead Location: 753860
Implantable Pulse Generator Implant Date: 20160614
Lead Channel Impedance Value: 490 Ohm
Lead Channel Impedance Value: 610 Ohm
Lead Channel Pacing Threshold Amplitude: 0.375 V
Lead Channel Pacing Threshold Amplitude: 1 V
Lead Channel Pacing Threshold Pulse Width: 0.5 ms
Lead Channel Pacing Threshold Pulse Width: 0.5 ms
Lead Channel Sensing Intrinsic Amplitude: 1.2 mV
Lead Channel Sensing Intrinsic Amplitude: 12 mV
Lead Channel Setting Pacing Amplitude: 2 V
Lead Channel Setting Pacing Amplitude: 2 V
Lead Channel Setting Pacing Pulse Width: 0.5 ms
Lead Channel Setting Sensing Sensitivity: 2 mV
Pulse Gen Model: 2240
Pulse Gen Serial Number: 7768207

## 2018-09-22 ENCOUNTER — Encounter: Payer: Self-pay | Admitting: Cardiology

## 2018-09-22 IMAGING — CT CT HEAD W/O CM
3 series · 14 of 47 positions shown, 16 images · non-contrast
Comparison: Prior CT from 05/03/2005.

CLINICAL DATA: Initial evaluation for acute weakness, ataxic gait.

EXAM:
CT HEAD WITHOUT CONTRAST
TECHNIQUE: Contiguous axial images were obtained from the base of the skull
through the vertex without intravenous contrast.

[Series 2: head wo · axial · 0.43mm/px · z∈[+351,+476]mm · 8 of 31 slices shown, 10 images]
[im 3/31  brain]
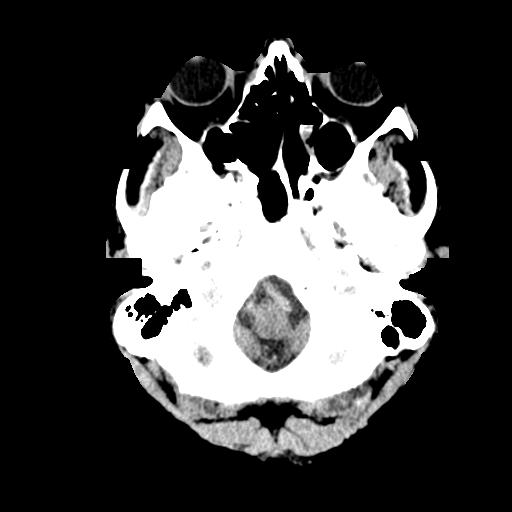
[im 3/31  bone]
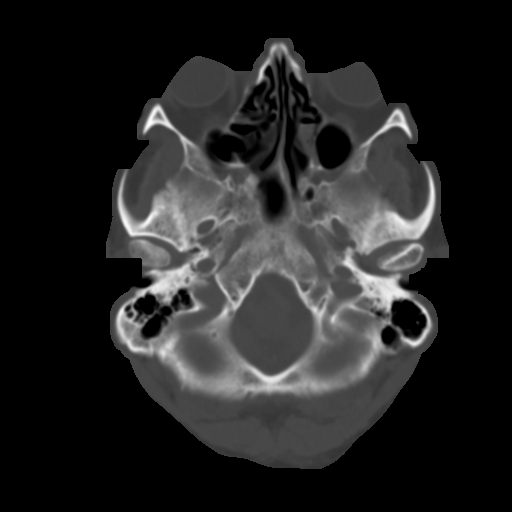
[im 7/31  brain]
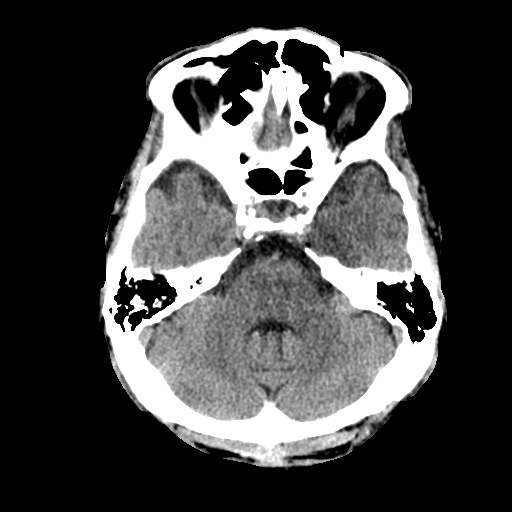
[im 10/31  brain]
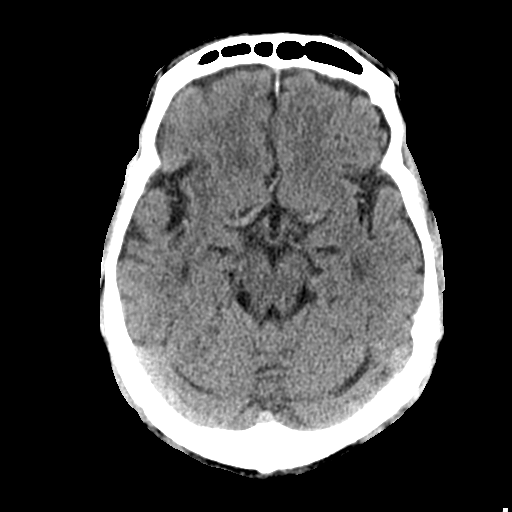
[im 14/31  brain]
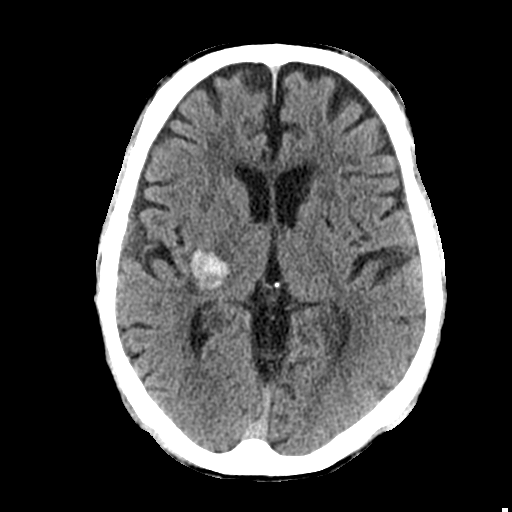
[im 17/31  brain]
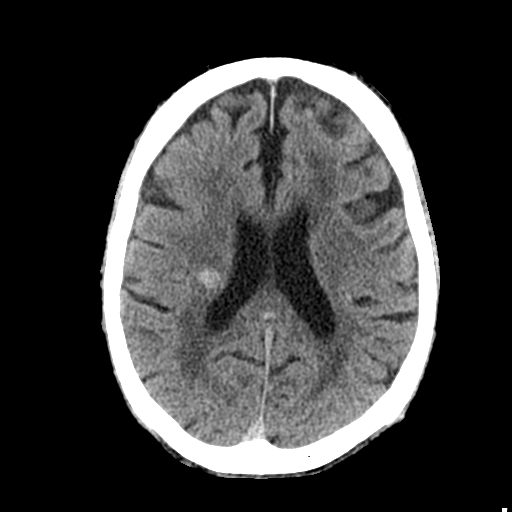
[im 17/31  bone]
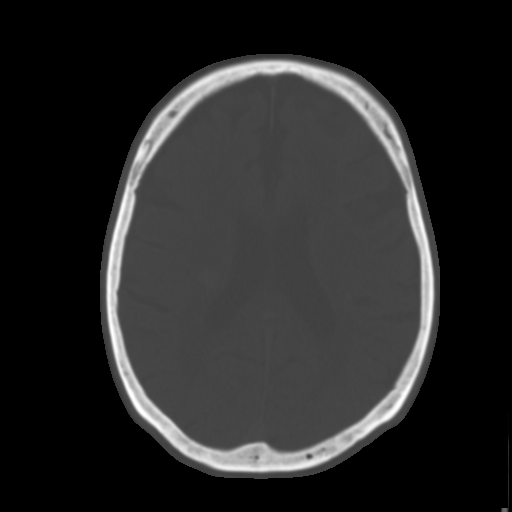
[im 21/31  brain]
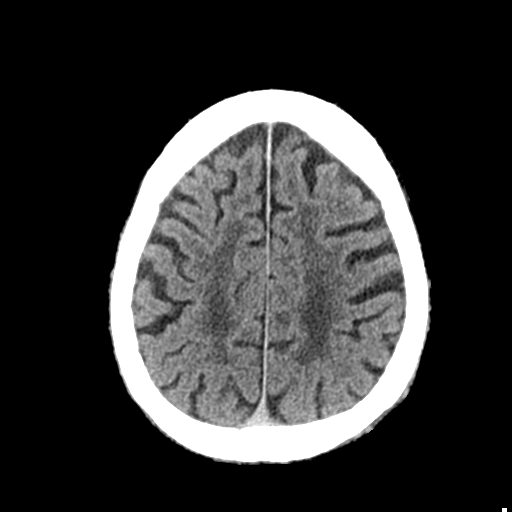
[im 24/31  brain]
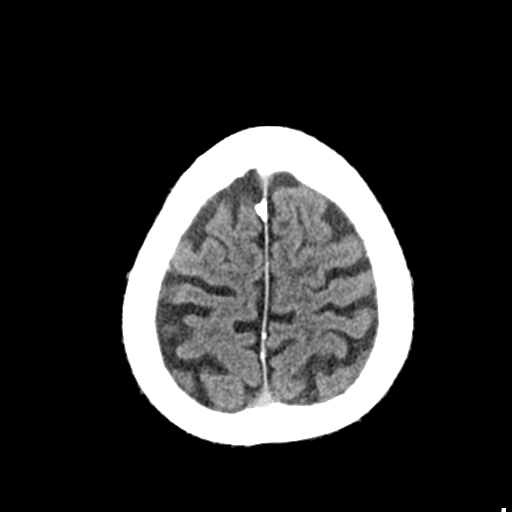
[im 28/31  brain]
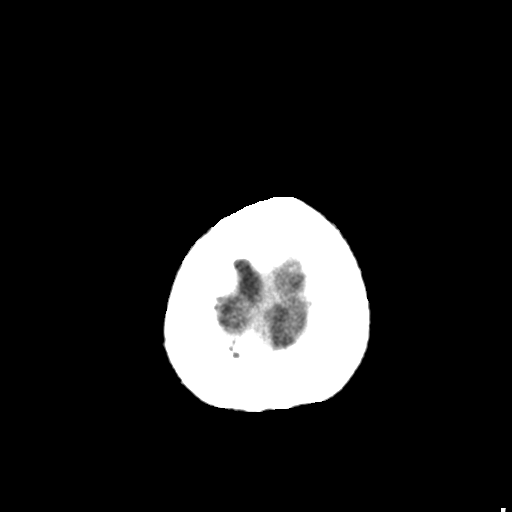

[Series 4: coronal soft tissue · coronal · 0.30mm/px · 3 of 66 slices shown]
[im 22/66  brain]
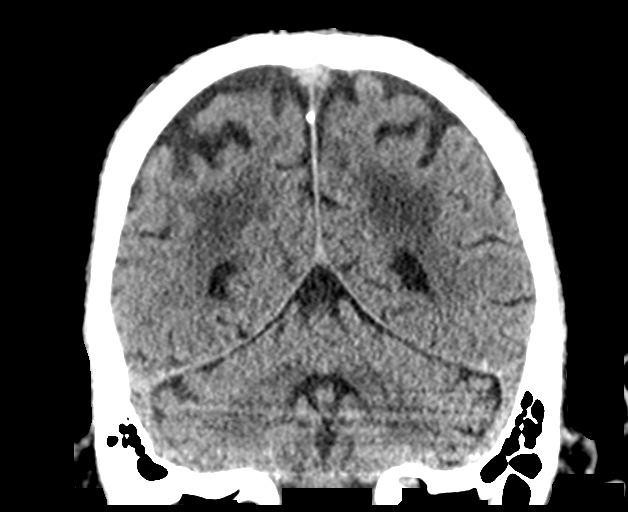
[im 29/66  brain]
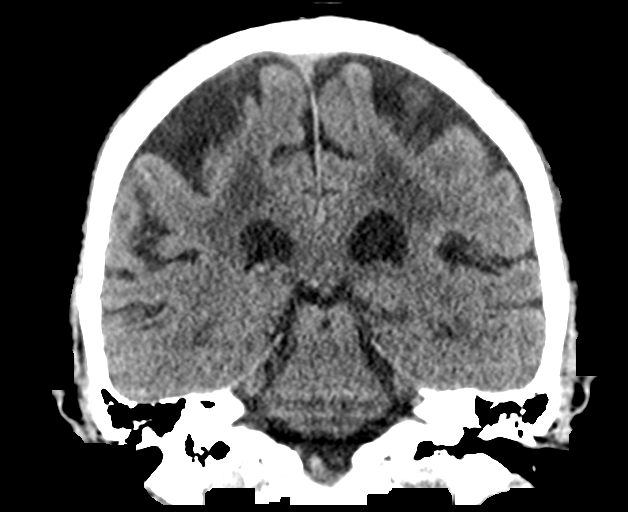
[im 37/66  brain]
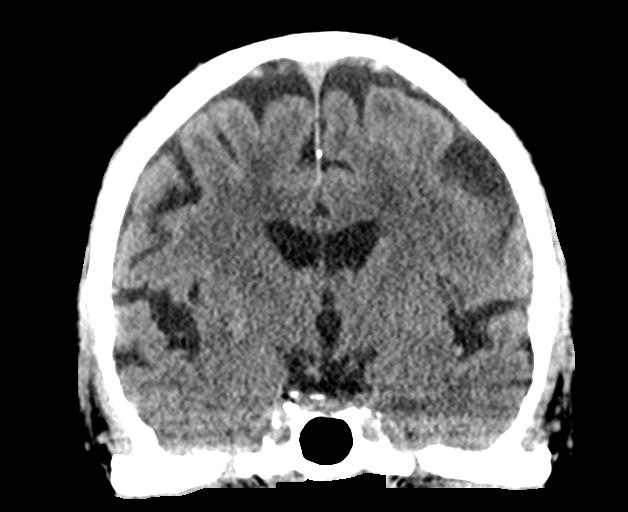

[Series 5: sagittal soft tissue · sagittal · 0.35mm/px · 3 of 53 slices shown]
[im 18/53  brain]
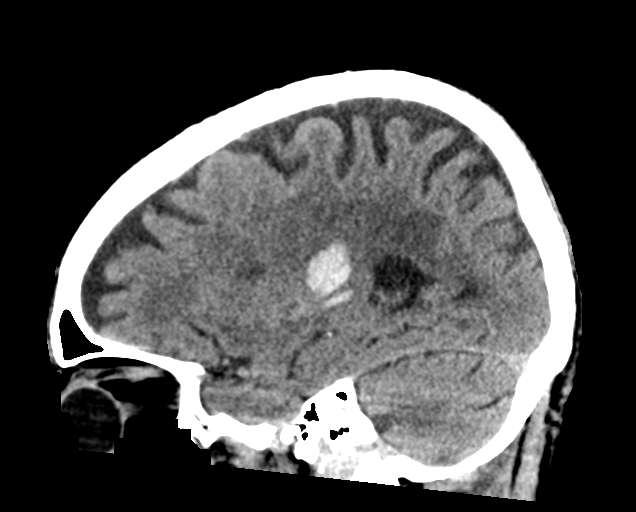
[im 27/53  brain]
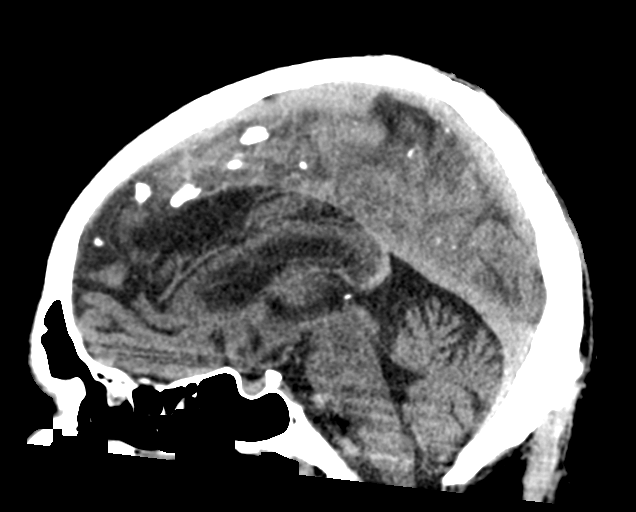
[im 35/53  brain]
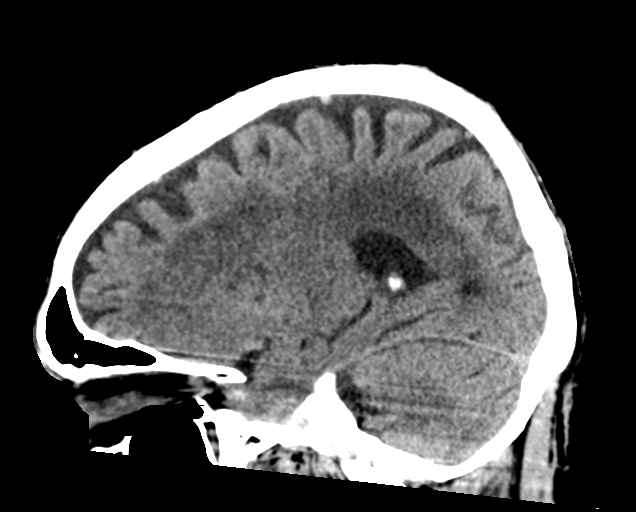

[14 of 47 positions shown; findings below may reference images not displayed]

FINDINGS: Brain: Generalized age-related cerebral atrophy. Moderately advanced
chronic microvascular ischemic disease.

There is an acute intraparenchymal hemorrhage centered at the right
basal ganglia that measures 14 x 21 x 28 mm (estimated volume 4 cc).
Mild localized vasogenic edema without significant mass effect.
Hemorrhages centered at the posterior limb of the right internal
capsule. No intraventricular extension. No midline shift.

Punctate 6 mm hyperdensity within the left lentiform nucleus also
noted, also suspicious for a possible tiny parenchymal hemorrhage
(series 2, image 12). No other acute large vessel territory infarct.
No mass lesion, midline shift or or mass effect. No hydrocephalus.
No extra-axial fluid collection.

Vascular: No hyperdense vessel. Prominent vascular calcifications
present within the carotid siphons.

Skull: Scalp soft tissues within normal limits.  Calvarium intact.

Sinuses/Orbits: No acute abnormality about the globes in orbits.
Remote defect noted at the left lamina papyracea. Scattered mucosal
thickening within the ethmoidal air cells and partially visualized
maxillary sinuses. No mastoid effusion.
IMPRESSION: 1. Acute intraparenchymal hemorrhage centered at the right basal
ganglia measuring 14 x 21 x 20 mm (estimated volume 4 cc). Mild
localized edema without significant mass effect. No intraventricular
extension.
2. Probable additional punctate 6 mm parenchymal hemorrhage within
the left lentiform nucleus, no associated edema. Underlying
hypertensive etiology is suspected.
3. Generalized age-related cerebral atrophy with moderate chronic
microvascular ischemic disease.
Critical Value/emergent results were called by telephone at the time
of interpretation on 03/21/2016 at [DATE] to Dr. ABIMAEL RUZ ,
who verbally acknowledged these results.

## 2018-09-22 NOTE — Progress Notes (Signed)
Remote pacemaker transmission.   

## 2018-09-24 DIAGNOSIS — I10 Essential (primary) hypertension: Secondary | ICD-10-CM | POA: Diagnosis not present

## 2018-09-24 DIAGNOSIS — I69354 Hemiplegia and hemiparesis following cerebral infarction affecting left non-dominant side: Secondary | ICD-10-CM | POA: Diagnosis not present

## 2018-09-24 DIAGNOSIS — I2510 Atherosclerotic heart disease of native coronary artery without angina pectoris: Secondary | ICD-10-CM | POA: Diagnosis not present

## 2018-09-24 DIAGNOSIS — E1169 Type 2 diabetes mellitus with other specified complication: Secondary | ICD-10-CM | POA: Diagnosis not present

## 2018-11-09 ENCOUNTER — Other Ambulatory Visit: Payer: Self-pay | Admitting: Cardiovascular Disease

## 2018-11-15 ENCOUNTER — Telehealth: Payer: Self-pay | Admitting: Cardiovascular Disease

## 2018-11-15 NOTE — Telephone Encounter (Signed)
LMOM. Alert for I hr 48 min episode of what appears to be AT on June 5,2020. Find out if pt symptomatic with episode.

## 2018-11-16 NOTE — Telephone Encounter (Signed)
LMOVM requesting call back to the DC.

## 2018-11-18 NOTE — Telephone Encounter (Signed)
Discussed with patient. He doesn't remember any thing specific about 11/12/18. Overall he is feeling well, and denies lightheadedness, dizziness, SOB, or palpitations.    Will follow up on his rhythm and symptoms after next remote check, scheduled for 7/7.

## 2018-12-09 ENCOUNTER — Other Ambulatory Visit: Payer: Self-pay | Admitting: Cardiovascular Disease

## 2018-12-14 ENCOUNTER — Ambulatory Visit (INDEPENDENT_AMBULATORY_CARE_PROVIDER_SITE_OTHER): Payer: Medicare Other | Admitting: *Deleted

## 2018-12-14 DIAGNOSIS — I441 Atrioventricular block, second degree: Secondary | ICD-10-CM | POA: Diagnosis not present

## 2018-12-15 LAB — CUP PACEART REMOTE DEVICE CHECK
Battery Remaining Longevity: 118 mo
Battery Remaining Percentage: 95.5 %
Battery Voltage: 2.99 V
Brady Statistic AP VP Percent: 45 %
Brady Statistic AP VS Percent: 7.4 %
Brady Statistic AS VP Percent: 7.3 %
Brady Statistic AS VS Percent: 40 %
Brady Statistic RA Percent Paced: 52 %
Brady Statistic RV Percent Paced: 52 %
Date Time Interrogation Session: 20200708032916
Implantable Lead Implant Date: 20061201
Implantable Lead Implant Date: 20061201
Implantable Lead Location: 753859
Implantable Lead Location: 753860
Implantable Pulse Generator Implant Date: 20160614
Lead Channel Impedance Value: 540 Ohm
Lead Channel Impedance Value: 640 Ohm
Lead Channel Pacing Threshold Amplitude: 0.375 V
Lead Channel Pacing Threshold Amplitude: 1 V
Lead Channel Pacing Threshold Pulse Width: 0.5 ms
Lead Channel Pacing Threshold Pulse Width: 0.5 ms
Lead Channel Sensing Intrinsic Amplitude: 12 mV
Lead Channel Sensing Intrinsic Amplitude: 3.1 mV
Lead Channel Setting Pacing Amplitude: 2 V
Lead Channel Setting Pacing Amplitude: 2 V
Lead Channel Setting Pacing Pulse Width: 0.5 ms
Lead Channel Setting Sensing Sensitivity: 2 mV
Pulse Gen Model: 2240
Pulse Gen Serial Number: 7768207

## 2018-12-24 ENCOUNTER — Encounter: Payer: Self-pay | Admitting: Cardiology

## 2018-12-24 DIAGNOSIS — E1165 Type 2 diabetes mellitus with hyperglycemia: Secondary | ICD-10-CM | POA: Diagnosis not present

## 2018-12-24 DIAGNOSIS — I10 Essential (primary) hypertension: Secondary | ICD-10-CM | POA: Diagnosis not present

## 2018-12-24 DIAGNOSIS — I2510 Atherosclerotic heart disease of native coronary artery without angina pectoris: Secondary | ICD-10-CM | POA: Diagnosis not present

## 2018-12-24 NOTE — Progress Notes (Signed)
Remote pacemaker transmission.   

## 2018-12-27 DIAGNOSIS — I10 Essential (primary) hypertension: Secondary | ICD-10-CM | POA: Diagnosis not present

## 2018-12-27 DIAGNOSIS — E1165 Type 2 diabetes mellitus with hyperglycemia: Secondary | ICD-10-CM | POA: Diagnosis not present

## 2018-12-27 DIAGNOSIS — I2510 Atherosclerotic heart disease of native coronary artery without angina pectoris: Secondary | ICD-10-CM | POA: Diagnosis not present

## 2018-12-27 DIAGNOSIS — Z125 Encounter for screening for malignant neoplasm of prostate: Secondary | ICD-10-CM | POA: Diagnosis not present

## 2018-12-28 LAB — COMPREHENSIVE METABOLIC PANEL
Albumin: 4.2 (ref 3.5–5.0)
Calcium: 10.2 (ref 8.7–10.7)
GFR calc Af Amer: 79
GFR calc non Af Amer: 68
Globulin: 3.6

## 2018-12-28 LAB — BASIC METABOLIC PANEL
BUN: 9 (ref 4–21)
Chloride: 100 (ref 99–108)
Creatinine: 1 (ref <=1.3)
Glucose: 132
Potassium: 4.6 (ref 3.4–5.3)
Sodium: 136 — AB (ref 137–147)

## 2018-12-28 LAB — CBC AND DIFFERENTIAL
HCT: 39 — AB (ref 41–53)
Hemoglobin: 12 — AB (ref 13.5–17.5)
Neutrophils Absolute: 2103
Platelets: 279 (ref 150–399)
WBC: 4.1

## 2018-12-28 LAB — HEPATIC FUNCTION PANEL
ALT: 11 (ref 10–40)
AST: 15 (ref 14–40)
Alkaline Phosphatase: 57 (ref 25–125)
Bilirubin, Total: 0.5

## 2018-12-28 LAB — LIPID PANEL
Cholesterol: 136 (ref 0–200)
HDL: 33 — AB (ref 35–70)
LDL Cholesterol: 80
LDl/HDL Ratio: 4.1
Triglycerides: 136 (ref 40–160)

## 2018-12-28 LAB — PSA: PSA: <0.1

## 2018-12-28 LAB — CBC: RBC: 4.93 (ref 3.87–5.11)

## 2018-12-28 LAB — HEMOGLOBIN A1C: Hemoglobin A1C: 6.3

## 2019-02-01 ENCOUNTER — Other Ambulatory Visit: Payer: Self-pay | Admitting: Cardiovascular Disease

## 2019-03-15 ENCOUNTER — Encounter: Payer: Medicare Other | Admitting: *Deleted

## 2019-03-28 DIAGNOSIS — I2510 Atherosclerotic heart disease of native coronary artery without angina pectoris: Secondary | ICD-10-CM | POA: Diagnosis not present

## 2019-03-28 DIAGNOSIS — D649 Anemia, unspecified: Secondary | ICD-10-CM | POA: Diagnosis not present

## 2019-03-28 DIAGNOSIS — I10 Essential (primary) hypertension: Secondary | ICD-10-CM | POA: Diagnosis not present

## 2019-03-28 DIAGNOSIS — I69354 Hemiplegia and hemiparesis following cerebral infarction affecting left non-dominant side: Secondary | ICD-10-CM | POA: Diagnosis not present

## 2019-03-29 DIAGNOSIS — I69354 Hemiplegia and hemiparesis following cerebral infarction affecting left non-dominant side: Secondary | ICD-10-CM | POA: Diagnosis not present

## 2019-03-29 DIAGNOSIS — I10 Essential (primary) hypertension: Secondary | ICD-10-CM | POA: Diagnosis not present

## 2019-03-29 DIAGNOSIS — D649 Anemia, unspecified: Secondary | ICD-10-CM | POA: Diagnosis not present

## 2019-03-29 DIAGNOSIS — I2510 Atherosclerotic heart disease of native coronary artery without angina pectoris: Secondary | ICD-10-CM | POA: Diagnosis not present

## 2019-04-08 ENCOUNTER — Ambulatory Visit (INDEPENDENT_AMBULATORY_CARE_PROVIDER_SITE_OTHER): Payer: Medicare Other | Admitting: *Deleted

## 2019-04-08 DIAGNOSIS — I471 Supraventricular tachycardia: Secondary | ICD-10-CM

## 2019-04-08 DIAGNOSIS — I441 Atrioventricular block, second degree: Secondary | ICD-10-CM

## 2019-04-08 LAB — CUP PACEART REMOTE DEVICE CHECK
Battery Remaining Longevity: 119 mo
Battery Remaining Percentage: 95.5 %
Battery Voltage: 2.99 V
Brady Statistic AP VP Percent: 38 %
Brady Statistic AP VS Percent: 7.9 %
Brady Statistic AS VP Percent: 8 %
Brady Statistic AS VS Percent: 46 %
Brady Statistic RA Percent Paced: 45 %
Brady Statistic RV Percent Paced: 46 %
Date Time Interrogation Session: 20201030042226
Implantable Lead Implant Date: 20061201
Implantable Lead Implant Date: 20061201
Implantable Lead Location: 753859
Implantable Lead Location: 753860
Implantable Pulse Generator Implant Date: 20160614
Lead Channel Impedance Value: 540 Ohm
Lead Channel Impedance Value: 630 Ohm
Lead Channel Pacing Threshold Amplitude: 0.375 V
Lead Channel Pacing Threshold Amplitude: 1 V
Lead Channel Pacing Threshold Pulse Width: 0.5 ms
Lead Channel Pacing Threshold Pulse Width: 0.5 ms
Lead Channel Sensing Intrinsic Amplitude: 12 mV
Lead Channel Sensing Intrinsic Amplitude: 2.1 mV
Lead Channel Setting Pacing Amplitude: 2 V
Lead Channel Setting Pacing Amplitude: 2 V
Lead Channel Setting Pacing Pulse Width: 0.5 ms
Lead Channel Setting Sensing Sensitivity: 2 mV
Pulse Gen Model: 2240
Pulse Gen Serial Number: 7768207

## 2019-04-10 NOTE — Progress Notes (Signed)
Remote reviewed.   Not pacemaker dependent. Battery status is good. Lead measurements are stable. Heart rate histogram is favorable. Occasional Paroxysmal atrial tachycardia, most episodes are brief.

## 2019-04-20 NOTE — Progress Notes (Signed)
Remote pacemaker transmission.   

## 2019-04-25 ENCOUNTER — Other Ambulatory Visit: Payer: Self-pay | Admitting: Cardiovascular Disease

## 2019-04-27 NOTE — Telephone Encounter (Signed)
Rx(s) sent to pharmacy electronically.  

## 2019-05-09 ENCOUNTER — Other Ambulatory Visit: Payer: Self-pay

## 2019-05-09 ENCOUNTER — Encounter: Payer: Self-pay | Admitting: Cardiovascular Disease

## 2019-05-09 ENCOUNTER — Ambulatory Visit (INDEPENDENT_AMBULATORY_CARE_PROVIDER_SITE_OTHER): Payer: Medicare Other | Admitting: Cardiovascular Disease

## 2019-05-09 VITALS — BP 185/87 | HR 79 | Temp 97.0°F | Ht 69.0 in | Wt 194.0 lb

## 2019-05-09 DIAGNOSIS — I10 Essential (primary) hypertension: Secondary | ICD-10-CM | POA: Diagnosis not present

## 2019-05-09 DIAGNOSIS — I441 Atrioventricular block, second degree: Secondary | ICD-10-CM | POA: Diagnosis not present

## 2019-05-09 DIAGNOSIS — E1149 Type 2 diabetes mellitus with other diabetic neurological complication: Secondary | ICD-10-CM

## 2019-05-09 DIAGNOSIS — I495 Sick sinus syndrome: Secondary | ICD-10-CM | POA: Diagnosis not present

## 2019-05-09 DIAGNOSIS — E785 Hyperlipidemia, unspecified: Secondary | ICD-10-CM | POA: Diagnosis not present

## 2019-05-09 DIAGNOSIS — I672 Cerebral atherosclerosis: Secondary | ICD-10-CM

## 2019-05-09 DIAGNOSIS — Z8679 Personal history of other diseases of the circulatory system: Secondary | ICD-10-CM | POA: Diagnosis not present

## 2019-05-09 DIAGNOSIS — Z8673 Personal history of transient ischemic attack (TIA), and cerebral infarction without residual deficits: Secondary | ICD-10-CM | POA: Diagnosis not present

## 2019-05-09 DIAGNOSIS — I4719 Other supraventricular tachycardia: Secondary | ICD-10-CM

## 2019-05-09 DIAGNOSIS — Z950 Presence of cardiac pacemaker: Secondary | ICD-10-CM

## 2019-05-09 DIAGNOSIS — I471 Supraventricular tachycardia: Secondary | ICD-10-CM

## 2019-05-09 NOTE — Patient Instructions (Signed)

## 2019-05-09 NOTE — Progress Notes (Signed)
Cardiology Office Note    Date:  05/09/2019   ID:  KANI MACKO, DOB 1940-12-14, MRN [id number]  PCP:  [name], MD  Cardiologist:   [name], MD   Chief Complaint  Patient presents with  . Pacemaker Check  . Irregular Heart Beat    History of Present Illness:  Richard Alvarado is a 78 y.o. male with second-degree atrioventricular block returning for pacemaker follow-up. He has hypertension, hyperlipidemia, DM and history of remote stroke. He had the pacemaker initially implanted in 2006 and underwent a pacemaker generator change out in June 2016 ([place]). He has not had any significant health problems since that time.  The patient specifically denies any chest pain at rest or exertion, dyspnea at rest or with exertion, orthopnea, paroxysmal nocturnal dyspnea, syncope, palpitations, focal neurological deficits, intermittent claudication, lower extremity edema, unexplained weight gain, cough, hemoptysis or wheezing.  On arrival with blood pressure significant elevated at 194/94 but progressive decrease in body of the visit was 160/81.  His last primary care visit his blood pressure was 142/86 and he reports that typically when he checks his blood pressure with his home monitor it is less than 130/80.  No changes were therefore made to his medications.  Metabolic parameters show acceptablencontrol with a hemoglobin A1c of 6.6% and a total cholesterol of 136.  The LDL is slightly above target at 80 and the HDL is low at 33.  Pacemaker interrogation shows normal device function with estimated generator longevity of 9.4-10.0 years.  He now has 44% atrial pacing and we turned rate response on today.  There is 44% ventricular pacing.  As in the past he has rare episodes of brief paroxysmal atrial tachycardia.  After we turned on rate response, he has had frequent episodes of competitive pacing causing false mode switch events.  There has been no evidence of  atrial fibrillation.  He has a long-standing history of very severe and hard to control systemic hypertension. At one point he was taking minoxidil but this was stopped for a pericardial effusion. 20 years ago he had a left brain stroke from which she has recovered without sequelae. In October 2017 he had hemorrhage in the right posterior basal ganglia.  CT of the head shows extensive intracranial atherosclerosis with dense calcification of the carotid siphons, suspected at least moderate stenosis of the right vertebral artery origin, but no areas of severe stenosis in the circle of Willis.  Pertinent negatives include the absence of coronary insufficiency by nuclear stress testing and the absence of renal artery stenosis by ultrasonography.    Past Medical History:  Diagnosis Date  . Cancer [facility]) 2002   prostate  . Cerebral atherosclerosis    CAROTID DOPPLER, 09/07/2009 - RIGHT AND LEFT CCAs-small-moderate amount of irregular mixed density plaque, no significant evidence of diameter reduction; RIGHT AND LEFT ICAs-moderate amount irregular mixed density plaque, no evidence of significant diameter reduction  . CVA (cerebral vascular accident) ([place]) 1994   Left brain  . Diabetes mellitus without complication ([place])   . History of CVA (cerebrovascular accident) 04/24/2013  . Hypercholesterolemia   . Hypertension    RENAL DOPPLER, 03/08/2009 - normal  . Hypertension in pregnancy, preeclampsia, severe 04/24/2013  . Obstructive sleep apnea 09/26/2005   AHI-19.46, during REM-36.88  . Pacemaker St. Jude victory, dual chamber, 2006 04/24/2013  . Pericardial effusion    2D ECHO, 03/07/2011 - EF >70%, normal  . Second degree heart block 04/24/2013  Past Surgical History:  Procedure Laterality Date  . EP IMPLANTABLE DEVICE N/A 11/21/2014   Procedure: PPM/BIV PPM Generator Changeout;  Surgeon: [name], MD;  Location: [place] [facility];  Service: Cardiovascular;  Laterality: N/A;  . NM MYOVIEW  LTD  03/31/2012   Normal study, no ECG changes, EKG negative for ischemia, post-stress EF 54%  . PACEMAKER INSERTION  05/09/2005   St. Jude [place], [place] #[id number], serial [id number], replaced 2016  . PROSTATE SURGERY  2002   removed- cancer    Current Medications: Outpatient Medications Prior to Visit  Medication Sig Dispense Refill  . amLODipine (NORVASC) 10 MG tablet TAKE 1 TABLET BY MOUTH EVERY DAY 90 tablet 1  . atorvastatin (LIPITOR) 80 MG tablet Take 80 mg by mouth at bedtime.    Marland Kitchen BYSTOLIC 20 MG TABS Take 1 tablet daily by mouth.  12  . cloNIDine (CATAPRES) 0.3 MG tablet Take 1 tablet (0.3 mg total) by mouth 3 (three) times daily. MUST KEEP APPOINTMENT 05/09/19 WITH DR Osinachi Navarrette FOR FUTURE REFILLS2 270 tablet 0  . furosemide (LASIX) 40 MG tablet Take 1 tablet (40 mg total) by mouth daily. 30 tablet 0  . glipiZIDE (GLUCOTROL) 5 MG tablet Take 1 tablet (5 mg total) by mouth 2 (two) times daily before a meal. 60 tablet 0  . losartan (COZAAR) 100 MG tablet Take 1 tablet (100 mg total) by mouth 2 (two) times daily. 60 tablet 0  . metFORMIN (GLUCOPHAGE) 1000 MG tablet Take 1 tablet (1,000 mg total) by mouth 2 (two) times daily with a meal. 60 tablet 0  . pantoprazole (PROTONIX) 40 MG tablet Take 1 tablet (40 mg total) by mouth daily. 30 tablet 0  . senna (SENOKOT) 8.6 MG tablet Take 1 tablet by mouth daily as needed for constipation.     No facility-administered medications prior to visit.      Allergies:   Patient has no known allergies.   Social History   Socioeconomic History  . Marital status: Married    Spouse name: [name]  . Number of children: 1  . Years of education: 96  . Highest education level: Not on file  Occupational History    Comment: [employer], retired Garment/textile technologist  Social Needs  . Financial resource strain: Not on file  . Food insecurity    Worry: Not on file    Inability: Not on file  . Transportation needs    Medical: Not on file    Non-medical: Not on file   Tobacco Use  . Smoking status: Former Smoker    Quit date: 06/09/1999    Years since quitting: 19.9  . Smokeless tobacco: Never Used  Substance and Sexual Activity  . Alcohol use: No  . Drug use: No  . Sexual activity: Not on file  Lifestyle  . Physical activity    Days per week: Not on file    Minutes per session: Not on file  . Stress: Not on file  Relationships  . Social Herbalist on phone: Not on file    Gets together: Not on file    Attends religious service: Not on file    Active member of club or organization: Not on file    Attends meetings of clubs or organizations: Not on file    Relationship status: Not on file  Other Topics Concern  . Not on file  Social History Narrative   Lives with wife   Caffeine free coffee only  Family History:  The patient's family history includes CVA in his mother; Heart attack in his father; Heart disease in his maternal grandmother; Hypertension in his sister; Stroke in his mother.   ROS:   Please see the history of present illness.    ROS All other systems reviewed and are negative.   PHYSICAL EXAM:   VS:  BP (!) 185/87   Pulse 79   Temp (!) 97 F (36.1 C)   Ht 5\' 9"  (1.753 m)   Wt 194 lb (88 kg)   SpO2 98%   BMI 28.65 kg/m     General: Alert, oriented x3, no distress, healthy right subclavian pacemaker site Head: no evidence of trauma, PERRL, EOMI, no exophtalmos or lid lag, no myxedema, no xanthelasma; normal ears, nose and oropharynx Neck: normal jugular venous pulsations and no hepatojugular reflux; brisk carotid pulses without delay and no carotid bruits Chest: clear to auscultation, no signs of consolidation by percussion or palpation, normal fremitus, symmetrical and full respiratory excursions Cardiovascular: normal position and quality of the apical impulse, regular rhythm, normal first and second heart sounds, no murmurs, rubs or gallops Abdomen: no tenderness or distention, no masses by palpation,  no abnormal pulsatility or arterial bruits, normal bowel sounds, no hepatosplenomegaly Extremities: no clubbing, cyanosis or edema; 2+ radial, ulnar and brachial pulses bilaterally; 2+ right femoral, posterior tibial and dorsalis pedis pulses; 2+ left femoral, posterior tibial and dorsalis pedis pulses; no subclavian or femoral bruits Neurological: grossly nonfocal Psych: Normal mood and affect   Wt Readings from Last 3 Encounters:  05/09/19 194 lb (88 kg)  05/05/18 195 lb (88.5 kg)  04/22/17 196 lb (88.9 kg)      Studies/Labs Reviewed:   EKG:  EKG is ordered today.  Sinus rhythm with first-degree AV block and voltage criteria for LVH  Recent Labs: 12/28/2018: ALT 11; BUN 9; Creatinine 1.0; Hemoglobin 12.0; Platelets 279; Potassium 4.6; Sodium 136   Lipid Panel    Component Value Date/Time   CHOL 136 12/28/2018   TRIG 136 12/28/2018   HDL 33 (A) 12/28/2018   CHOLHDL 3.3 03/22/2016 0528   VLDL 9 03/22/2016 0528   LDLCALC 80 12/28/2018     ASSESSMENT:    1. SSS (sick sinus syndrome) ([place])   2. Second degree AV block   3. PAT (paroxysmal atrial tachycardia) ([place])   4. Pacemaker   5. Essential hypertension   6. Dyslipidemia (high LDL; low HDL)   7. DM (diabetes mellitus), type 2 with neurological complications ([place])   8. History of arterial ischemic stroke   9. History of intracranial hemorrhage   10. Intracranial atherosclerosis      PLAN:  In order of problems listed above:  1. Second degree AV block/SSS: Trying to avoid use of additional AV nodal blocking agents due to the relatively high burden of ventricular pacing.  No evidence of heart failure. 2. PM: Some episodes of mode switch related to competitive pacing after returning testosterone.  Otherwise normal device function. 3. PAT: Denies palpitations.  No true atrial fibrillation is seen. 4. HTN: He has significant "whitecoat hypertension" but this is superimposed on very severe high blood pressure requiring 4  different antihypertensive medications and a loop diuretic.  He has a history of pericardial effusion secondary to minoxidil 5. HLP: LDL cholesterol is not as good as it was a year ago, but is acceptable.  No changes made to his medications. 6. DM: Excellent control 7. Remote ischemic CVA and recent ICH: He  has barely noticeable residual weakness in his left upper and left lower extremities.  He can walk without assistance, without recent falls or injuries. 8. Intracranial atherosclerosis: Reviewed results of previous cranial CT angiography 2017.  Avoid excessively rapid reduction in blood pressure.    Medication Adjustments/Labs and Tests Ordered: Current medicines are reviewed at length with the patient today.  Concerns regarding medicines are outlined above.  Medication changes, Labs and Tests ordered today are listed in the Patient Instructions below. Patient Instructions  Medication Instructions:  No changes *If you need a refill on your cardiac medications before your next appointment, please call your pharmacy*  Lab Work: None ordered If you have labs (blood work) drawn today and your tests are completely normal, you will receive your results only by: Marland Kitchen MyChart Message (if you have MyChart) OR . A paper copy in the mail If you have any lab test that is abnormal or we need to change your treatment, we will call you to review the results.  Testing/Procedures: None ordered  Follow-Up: At [facility], you and your health needs are our priority.  As part of our continuing mission to provide you with exceptional heart care, we have created designated Provider Care Teams.  These Care Teams include your primary Cardiologist (physician) and Advanced Practice Providers (APPs -  Physician Assistants and Nurse Practitioners) who all work together to provide you with the care you need, when you need it.  Your next appointment:   12 month(s)  The format for your next appointment:   In  Person  Provider:   [name], MD     Signed, [name], MD  05/09/2019 11:20 AM    [address], [place], [place]  [address] Phone: [phone]; Fax: [phone]

## 2019-05-11 ENCOUNTER — Other Ambulatory Visit: Payer: Self-pay

## 2019-05-11 ENCOUNTER — Ambulatory Visit (INDEPENDENT_AMBULATORY_CARE_PROVIDER_SITE_OTHER): Payer: Medicare Other | Admitting: Family Medicine

## 2019-05-11 ENCOUNTER — Encounter: Payer: Self-pay | Admitting: Family Medicine

## 2019-05-11 VITALS — BP 180/110 | HR 72 | Temp 97.9°F | Ht 69.0 in | Wt 196.6 lb

## 2019-05-11 DIAGNOSIS — I10 Essential (primary) hypertension: Secondary | ICD-10-CM | POA: Diagnosis not present

## 2019-05-11 DIAGNOSIS — E119 Type 2 diabetes mellitus without complications: Secondary | ICD-10-CM

## 2019-05-11 DIAGNOSIS — E7800 Pure hypercholesterolemia, unspecified: Secondary | ICD-10-CM | POA: Diagnosis not present

## 2019-05-11 DIAGNOSIS — D649 Anemia, unspecified: Secondary | ICD-10-CM

## 2019-05-11 LAB — POCT UA - MICROALBUMIN
Creatinine, POC: 50 mg/dL
Microalbumin Ur, POC: 10 mg/L

## 2019-05-11 NOTE — Patient Instructions (Addendum)
labwork Consider vaccinations for flu and pneumonia  voltaren gel for joint pain-topical, over the counter

## 2019-05-11 NOTE — Progress Notes (Signed)
New Patient Office Visit  Subjective:  Patient ID: Richard Alvarado, male    DOB: [birth year]  Age: 78 y.o. MRN: [id number]  CC:  Chief Complaint  Patient presents with  . Establish Care  DM-glucose monitor at home -normal range-taken at home by wife who is a nurse-metformin/glipizide-stable ,no low readings Hyperlipidemia-atorvastatin daily-stable -lipid panel -LDL-80 HTN-Losartan, lasix, clonidine, Bystolic-normal range-taken at home by wife who is a nurse GERD-Prantaprozole-stable Cardiology-yearly appt-SSS with pacemaker HPI Richard Alvarado presents for ongoing medical management-pt with no CP/SOB DM-takes metformin/glipizide daily with food, no foot problems-wears shoes in home, eye appt greater than 10 years ago-stable glucose readings-diagnosis DM-more than 5 years ago Walking for exercise.  Pt states cardiology encouraged more walking for heart health HTN-no recent change, normal range at home-elevated in office-wife takes at home with manual cuff, cardiology following Hyperlipidemia-atorvastatin-stable   Past Medical History:  Diagnosis Date  . Arthritis   . Cancer [facility]) 2002   prostate  . Cerebral atherosclerosis    CAROTID DOPPLER, 09/07/2009 - RIGHT AND LEFT CCAs-small-moderate amount of irregular mixed density plaque, no significant evidence of diameter reduction; RIGHT AND LEFT ICAs-moderate amount irregular mixed density plaque, no evidence of significant diameter reduction  . CVA (cerebral vascular accident) ([place]) 1994   Left brain  . Diabetes mellitus without complication (Richard Alvarado)   . History of CVA (cerebrovascular accident) 04/24/2013  . Hypercholesterolemia   . Hypertension    RENAL DOPPLER, 03/08/2009 - normal  . Hypertension in pregnancy, preeclampsia, severe 04/24/2013  . Obstructive sleep apnea 09/26/2005   AHI-19.46, during REM-36.88  . Pacemaker St. Jude victory, dual chamber, 2006 04/24/2013  . Pericardial effusion    2D ECHO, 03/07/2011 - EF >70%,  normal  . Second degree heart block 04/24/2013    Past Surgical History:  Procedure Laterality Date  . EP IMPLANTABLE DEVICE N/A 11/21/2014   Procedure: PPM/BIV PPM Generator Changeout;  Surgeon: [name], MD;  Location: [place] [facility];  Service: Cardiovascular;  Laterality: N/A;  . EYE SURGERY    . NM MYOVIEW LTD  03/31/2012   Normal study, no ECG changes, EKG negative for ischemia, post-stress EF 54%  . PACEMAKER INSERTION  05/09/2005   St. Jude [place], [place] #[id number], serial [id number], replaced 2016  . PROSTATE SURGERY  2002   removed- cancer    Family History  Problem Relation Age of Onset  . CVA Mother   . Stroke Mother   . Heart attack Father   . Hypertension Sister   . Heart disease Maternal Grandmother     Social History   Socioeconomic History  . Marital status: Married    Spouse name: [name]  . Number of children: 1  . Years of education: 73  . Highest education level: Not on file  Occupational History  . Occupation: reitred    Comment: Interior and spatial designer, retired Secondary school teacher  . Financial resource strain: Not on file  . Food insecurity    Worry: Not on file    Inability: Not on file  . Transportation needs    Medical: Not on file    Non-medical: Not on file  Tobacco Use  . Smoking status: Former Smoker    Quit date: 06/09/1999    Years since quitting: 19.9  . Smokeless tobacco: Never Used  Substance and Sexual Activity  . Alcohol use: No  . Drug use: No  . Sexual activity: Not Currently  Lifestyle  . Physical activity    Days per week:  Not on file    Minutes per session: Not on file  . Stress: Not on file  Relationships  . Social Herbalist on phone: Not on file    Gets together: Not on file    Attends religious service: Not on file    Active member of club or organization: Not on file    Attends meetings of clubs or organizations: Not on file    Relationship status: Not on file  . Intimate partner violence    Fear of  current or ex partner: Not on file    Emotionally abused: Not on file    Physically abused: Not on file    Forced sexual activity: Not on file  Other Topics Concern  . Not on file  Social History Narrative   Lives with wife   Caffeine free coffee only    ROS Review of Systems  Constitutional: Positive for fatigue.  HENT: Negative.   Eyes:       Readers  Respiratory: Negative.   Cardiovascular:       Pacemaker -SSS  Gastrointestinal:       GERD  Endocrine:       DM-oral meds  Musculoskeletal: Positive for arthralgias.  Skin: Negative.   Allergic/Immunologic: Negative.   Neurological: Negative.   Hematological:       Using tylenol for treatment  Psychiatric/Behavioral: Negative.     Objective:   Today's Vitals: BP (!) 180/110 (BP Location: Left Arm, Patient Position: Sitting, Cuff Size: Normal)   Pulse 72   Temp 97.9 F (36.6 C) (Oral)   Ht 5\' 9"  (1.753 m)   Wt 196 lb 9.6 oz (89.2 kg)   SpO2 98%   BMI 29.03 kg/m   Physical Exam Vitals signs reviewed.  Constitutional:      Appearance: Normal appearance. He is normal weight.  HENT:     Head: Normocephalic and atraumatic.  Neck:     Musculoskeletal: Normal range of motion and neck supple.  Cardiovascular:     Rate and Rhythm: Normal rate and regular rhythm.     Pulses: Normal pulses.     Heart sounds: Normal heart sounds.  Pulmonary:     Effort: Pulmonary effort is normal.     Breath sounds: Normal breath sounds.  Neurological:     Mental Status: He is alert and oriented to person, place, and time.  Psychiatric:        Mood and Affect: Mood normal.     Assessment & Plan:    Outpatient Encounter Medications as of 05/11/2019  Medication Sig  . amLODipine (NORVASC) 10 MG tablet TAKE 1 TABLET BY MOUTH EVERY DAY  . atorvastatin (LIPITOR) 80 MG tablet Take 80 mg by mouth at bedtime.  Marland Kitchen BYSTOLIC 20 MG TABS Take 1 tablet daily by mouth.  . cloNIDine (CATAPRES) 0.3 MG tablet Take 1 tablet (0.3 mg total) by  mouth 3 (three) times daily. MUST KEEP APPOINTMENT 05/09/19 WITH DR CROITORU FOR FUTURE REFILLS2  . furosemide (LASIX) 40 MG tablet Take 1 tablet (40 mg total) by mouth daily.  Marland Kitchen glipiZIDE (GLUCOTROL) 5 MG tablet Take 1 tablet (5 mg total) by mouth 2 (two) times daily before a meal.  . losartan (COZAAR) 100 MG tablet Take 1 tablet (100 mg total) by mouth 2 (two) times daily.  . metFORMIN (GLUCOPHAGE) 1000 MG tablet Take 1 tablet (1,000 mg total) by mouth 2 (two) times daily with a meal.  . pantoprazole (PROTONIX) 40 MG tablet  Take 1 tablet (40 mg total) by mouth daily.  Marland Kitchen senna (SENOKOT) 8.6 MG tablet Take 1 tablet by mouth daily as needed for constipation.   No facility-administered encounter medications on file as of 05/11/2019.   1. Diabetes mellitus type 2 in nonobese [facility]) Foot exam Ordered eye exam Glipizide/metformin-stable A1c 12/2018 - Ambulatory referral to Ophthalmology - Hemoglobin A1c - Comprehensive Metabolic Panel (CMET) - Microalbumin, urine  2. Anemia, unspecified type Recheck-slightly low H/H-no h/o of bowel changes - CBC with Differential  3. Hypercholesterolemia lipitor-no recent change-7/20 LDL-80  4. Essential hypertension Cardiology took pt off asa Normal readings at home-elevated in office No recent dosage change at cardiology Lasix/Clonidine/Cozaar/Norvasc  5.GERD-protonix Follow-up:  6 months-DM LISA Hannah Beat, MD

## 2019-05-12 DIAGNOSIS — D649 Anemia, unspecified: Secondary | ICD-10-CM | POA: Diagnosis not present

## 2019-05-12 LAB — CBC WITH DIFFERENTIAL/PLATELET
Absolute Monocytes: 451 cells/uL (ref 200–950)
Basophils Absolute: 41 cells/uL (ref 0–200)
Basophils Relative: 0.9 %
Eosinophils Absolute: 83 cells/uL (ref 15–500)
Eosinophils Relative: 1.8 %
HCT: 39.1 % (ref 38.5–50.0)
Hemoglobin: 12.5 g/dL — ABNORMAL LOW (ref 13.2–17.1)
Lymphs Abs: 1348 cells/uL (ref 850–3900)
MCH: 24.6 pg — ABNORMAL LOW (ref 27.0–33.0)
MCHC: 32 g/dL (ref 32.0–36.0)
MCV: 77 fL — ABNORMAL LOW (ref 80.0–100.0)
MPV: 10.6 fL (ref 7.5–12.5)
Monocytes Relative: 9.8 %
Neutro Abs: 2677 cells/uL (ref 1500–7800)
Neutrophils Relative %: 58.2 %
Platelets: 309 10*3/uL (ref 140–400)
RBC: 5.08 10*6/uL (ref 4.20–5.80)
RDW: 13.2 % (ref 11.0–15.0)
Total Lymphocyte: 29.3 %
WBC: 4.6 10*3/uL (ref 3.8–10.8)

## 2019-05-24 ENCOUNTER — Other Ambulatory Visit: Payer: Self-pay | Admitting: Cardiovascular Disease

## 2019-05-24 NOTE — Telephone Encounter (Signed)
Rx request sent to pharmacy.  

## 2019-07-08 ENCOUNTER — Ambulatory Visit (INDEPENDENT_AMBULATORY_CARE_PROVIDER_SITE_OTHER): Payer: Medicare Other | Admitting: *Deleted

## 2019-07-08 DIAGNOSIS — I471 Supraventricular tachycardia: Secondary | ICD-10-CM

## 2019-07-08 LAB — CUP PACEART REMOTE DEVICE CHECK
Battery Remaining Longevity: 120 mo
Battery Remaining Percentage: 95.5 %
Battery Voltage: 2.99 V
Brady Statistic AP VP Percent: 7.8 %
Brady Statistic AP VS Percent: 8.2 %
Brady Statistic AS VP Percent: 17 %
Brady Statistic AS VS Percent: 67 %
Brady Statistic RA Percent Paced: 16 %
Brady Statistic RV Percent Paced: 25 %
Date Time Interrogation Session: 20210129020018
Implantable Lead Implant Date: 20061201
Implantable Lead Implant Date: 20061201
Implantable Lead Location: 753859
Implantable Lead Location: 753860
Implantable Pulse Generator Implant Date: 20160614
Lead Channel Impedance Value: 480 Ohm
Lead Channel Impedance Value: 540 Ohm
Lead Channel Pacing Threshold Amplitude: 0.375 V
Lead Channel Pacing Threshold Amplitude: 0.5 V
Lead Channel Pacing Threshold Pulse Width: 0.5 ms
Lead Channel Pacing Threshold Pulse Width: 0.5 ms
Lead Channel Sensing Intrinsic Amplitude: 1.9 mV
Lead Channel Sensing Intrinsic Amplitude: 12 mV
Lead Channel Setting Pacing Amplitude: 2 V
Lead Channel Setting Pacing Amplitude: 2 V
Lead Channel Setting Pacing Pulse Width: 0.5 ms
Lead Channel Setting Sensing Sensitivity: 2 mV
Pulse Gen Model: 2240
Pulse Gen Serial Number: 7768207

## 2019-07-08 NOTE — Progress Notes (Signed)
PPM Remote  

## 2019-07-21 ENCOUNTER — Other Ambulatory Visit: Payer: Self-pay | Admitting: Cardiovascular Disease

## 2019-07-21 DIAGNOSIS — Z23 Encounter for immunization: Secondary | ICD-10-CM | POA: Diagnosis not present

## 2019-07-21 NOTE — Telephone Encounter (Signed)
Rx has been sent to the pharmacy electronically. ° °

## 2019-08-19 DIAGNOSIS — Z23 Encounter for immunization: Secondary | ICD-10-CM | POA: Diagnosis not present

## 2019-09-13 ENCOUNTER — Ambulatory Visit (INDEPENDENT_AMBULATORY_CARE_PROVIDER_SITE_OTHER): Payer: Medicare Other | Admitting: Urology

## 2019-09-13 ENCOUNTER — Encounter: Payer: Self-pay | Admitting: Urology

## 2019-09-13 ENCOUNTER — Other Ambulatory Visit: Payer: Self-pay

## 2019-09-13 VITALS — BP 216/103 | HR 86 | Temp 97.5°F

## 2019-09-13 DIAGNOSIS — Z8546 Personal history of malignant neoplasm of prostate: Secondary | ICD-10-CM | POA: Diagnosis not present

## 2019-09-13 DIAGNOSIS — N5231 Erectile dysfunction following radical prostatectomy: Secondary | ICD-10-CM

## 2019-09-13 LAB — POCT URINALYSIS DIPSTICK
Bilirubin, UA: NEGATIVE
Blood, UA: NEGATIVE
Glucose, UA: NEGATIVE
Ketones, UA: NEGATIVE
Leukocytes, UA: NEGATIVE
Nitrite, UA: NEGATIVE
Protein, UA: NEGATIVE
Spec Grav, UA: <=1.005 — AB (ref 1.010–1.025)
Urobilinogen, UA: 0.2 E.U./dL
pH, UA: 6.5 (ref 5.0–8.0)

## 2019-09-13 NOTE — Progress Notes (Signed)
H&P  Chief Complaint: Prostate Cancer  History of Present Illness:   4.6.2021: He returns today for follow-up (overdue annual). Stable voiding sx's and urinary pattern. No blood per urine. No leakage or incontinence. He denies having had any urological complaints since last seen 1.5 yrs ago. He remains unbothered by his erectile dysfunction.   (below copied from [facility] records):  Prostate Cancer:  Richard Alvarado is a 78 year-old male established patient who is here for prostate cancer which has been treated.  He did have surgery. He had the following treatment for prostate cancer: robotic prostatectomy. His prostate surgery was done 12/11/2009.   His PSA blood tests have been low since his prostate cancer treatment was started.   He does have problems with erections. He does not have urinary incontinence. He does not have an abnormal sensation when needing to urinate. He does not have to strain or bear down to start his urinary stream.   He is not having pain in new locations. He does have a good appetite. BOWEL HABITS: his bowels are moving normally. He has not seen blood in his stool since the biopsy. He has not recently had unwanted weight loss.   He apparently underwent robotic-assisted radical prostatectomy by Dr. Brendia Sacks at [facility] in July 2011. The patient had stage pT2cNoMx, Gleason 4+5=9 adenocarcinoma. PSA measurements have apparently all been 0 since that time.   7.2.2019: This man returns for annual followup. Over the past year, he has had no change in urinary pattern. He has had no blood in his urine or stool. Stable weight, no bony pain. Normal appetite.   He does have erectile dysfunction, but this is not bothersome.  Past Medical History:  Diagnosis Date  . Arthritis   . Cancer [facility]) 2002   prostate  . Cerebral atherosclerosis    CAROTID DOPPLER, 09/07/2009 - RIGHT AND LEFT CCAs-small-moderate amount of irregular mixed density plaque,  no significant evidence of diameter reduction; RIGHT AND LEFT ICAs-moderate amount irregular mixed density plaque, no evidence of significant diameter reduction  . CVA (cerebral vascular accident) ([place]) 1994   Left brain  . Diabetes mellitus without complication ([place])   . History of CVA (cerebrovascular accident) 04/24/2013  . Hypercholesterolemia   . Hypertension    RENAL DOPPLER, 03/08/2009 - normal  . Hypertension in pregnancy, preeclampsia, severe 04/24/2013  . Obstructive sleep apnea 09/26/2005   AHI-19.46, during REM-36.88  . Pacemaker St. Jude victory, dual chamber, 2006 04/24/2013  . Pericardial effusion    2D ECHO, 03/07/2011 - EF >70%, normal  . Prostate cancer ([place])   . Second degree heart block 04/24/2013    Past Surgical History:  Procedure Laterality Date  . EP IMPLANTABLE DEVICE N/A 11/21/2014   Procedure: PPM/BIV PPM Generator Changeout;  Surgeon: [name], MD;  Location: [place] [facility];  Service: Cardiovascular;  Laterality: N/A;  . EYE SURGERY    . NM MYOVIEW LTD  03/31/2012   Normal study, no ECG changes, EKG negative for ischemia, post-stress EF 54%  . PACEMAKER INSERTION  05/09/2005   St. Jude [place], [place] #[id number], serial [phone], replaced 2016  . PROSTATE SURGERY  2002   removed- cancer    Home Medications:  Allergies as of 09/13/2019   No Known Allergies     Medication List       Accurate as of September 13, 2019  1:48 PM. If you have any questions, ask your nurse or doctor.  amLODipine 10 MG tablet Commonly known as: NORVASC TAKE 1 TABLET BY MOUTH EVERY DAY   atorvastatin 80 MG tablet Commonly known as: LIPITOR Take 80 mg by mouth at bedtime.   Bystolic 20 MG Tabs Generic drug: Nebivolol HCl Take 1 tablet daily by mouth.   cloNIDine 0.3 MG tablet Commonly known as: CATAPRES Take 1 tablet (0.3 mg total) by mouth 3 (three) times daily.   furosemide 40 MG tablet Commonly known as: LASIX Take 1 tablet (40 mg total) by mouth  daily.   glipiZIDE 5 MG tablet Commonly known as: GLUCOTROL Take 1 tablet (5 mg total) by mouth 2 (two) times daily before a meal.   losartan 100 MG tablet Commonly known as: COZAAR Take 1 tablet (100 mg total) by mouth 2 (two) times daily.   metFORMIN 1000 MG tablet Commonly known as: GLUCOPHAGE Take 1 tablet (1,000 mg total) by mouth 2 (two) times daily with a meal.   pantoprazole 40 MG tablet Commonly known as: PROTONIX Take 1 tablet (40 mg total) by mouth daily.   senna 8.6 MG tablet Commonly known as: SENOKOT Take 1 tablet by mouth daily as needed for constipation.       Allergies: No Known Allergies  Family History  Problem Relation Age of Onset  . CVA Mother   . Stroke Mother   . Heart attack Father   . Hypertension Sister   . Heart disease Maternal Grandmother     Social History:  reports that he quit smoking about 20 years ago. He has never used smokeless tobacco. He reports that he does not drink alcohol or use drugs.  ROS: A complete review of systems was performed.  All systems are negative except for pertinent findings as noted.  Physical Exam:  Vital signs in last 24 hours: BP (!) 216/103   Pulse 86   Temp (!) 97.5 F (36.4 C)  Constitutional:  Alert and oriented, No acute distress Cardiovascular: Regular rate  Respiratory: Normal respiratory effort GI: Abdomen is soft, nontender, nondistended, no abdominal masses.  No hernias. Genitourinary: Normal male phallus (uncircumcised), testes are descended bilaterally and non-tender and without masses, scrotum is normal in appearance without lesions or masses, perineum is normal on inspection.  Neurologic: Grossly intact, no focal deficits Psychiatric: Normal mood and affect    Results for orders placed or performed in visit on 09/13/19 (from the past 24 hour(s))  POCT urinalysis dipstick     Status: Abnormal   Collection Time: 09/13/19  1:16 PM  Result Value Ref Range   Color, UA light yellow     Clarity, UA     Glucose, UA Negative Negative   Bilirubin, UA neg    Ketones, UA neg    Spec Grav, UA <=1.005 (A) 1.010 - 1.025   Blood, UA neg    pH, UA 6.5 5.0 - 8.0   Protein, UA Negative Negative   Urobilinogen, UA 0.2 0.2 or 1.0 E.U./dL   Nitrite, UA neg    Leukocytes, UA Negative Negative   Appearance clear    Odor     No results found for this or any previous visit (from the past 240 hour(s)).  I have reviewed prior pt notes  I have reviewed notes from referring/previous physicians  I have reviewed urinalysis results  I have reviewed prior PSA results    Impression/Assessment:  We will get a PSA today. He has been doing very well with regards to his urination without leakage post prostatectomy ~ 10  years ago  Plan:  1. PSA today -- will forward results  2. Return in 1 yr for OV w/ labs  cc: Dr Nevada Crane

## 2019-09-15 DIAGNOSIS — E1165 Type 2 diabetes mellitus with hyperglycemia: Secondary | ICD-10-CM | POA: Diagnosis not present

## 2019-09-15 DIAGNOSIS — Z8546 Personal history of malignant neoplasm of prostate: Secondary | ICD-10-CM | POA: Diagnosis not present

## 2019-09-15 DIAGNOSIS — E785 Hyperlipidemia, unspecified: Secondary | ICD-10-CM | POA: Diagnosis not present

## 2019-09-15 DIAGNOSIS — Z0189 Encounter for other specified special examinations: Secondary | ICD-10-CM | POA: Diagnosis not present

## 2019-09-15 DIAGNOSIS — G4733 Obstructive sleep apnea (adult) (pediatric): Secondary | ICD-10-CM | POA: Diagnosis not present

## 2019-09-15 DIAGNOSIS — K5900 Constipation, unspecified: Secondary | ICD-10-CM | POA: Diagnosis not present

## 2019-09-15 DIAGNOSIS — K219 Gastro-esophageal reflux disease without esophagitis: Secondary | ICD-10-CM | POA: Diagnosis not present

## 2019-09-15 DIAGNOSIS — I10 Essential (primary) hypertension: Secondary | ICD-10-CM | POA: Diagnosis not present

## 2019-09-30 DIAGNOSIS — I10 Essential (primary) hypertension: Secondary | ICD-10-CM | POA: Diagnosis not present

## 2019-09-30 DIAGNOSIS — E1165 Type 2 diabetes mellitus with hyperglycemia: Secondary | ICD-10-CM | POA: Diagnosis not present

## 2019-09-30 DIAGNOSIS — E785 Hyperlipidemia, unspecified: Secondary | ICD-10-CM | POA: Diagnosis not present

## 2019-10-03 ENCOUNTER — Other Ambulatory Visit: Payer: Self-pay | Admitting: Urology

## 2019-10-03 DIAGNOSIS — Z8546 Personal history of malignant neoplasm of prostate: Secondary | ICD-10-CM | POA: Diagnosis not present

## 2019-10-04 LAB — PSA: PSA: <0.1 ng/mL (ref <=4.0)

## 2019-10-05 DIAGNOSIS — E1165 Type 2 diabetes mellitus with hyperglycemia: Secondary | ICD-10-CM | POA: Diagnosis not present

## 2019-10-05 DIAGNOSIS — D649 Anemia, unspecified: Secondary | ICD-10-CM | POA: Diagnosis not present

## 2019-10-05 DIAGNOSIS — K5900 Constipation, unspecified: Secondary | ICD-10-CM | POA: Diagnosis not present

## 2019-10-05 DIAGNOSIS — E785 Hyperlipidemia, unspecified: Secondary | ICD-10-CM | POA: Diagnosis not present

## 2019-10-05 DIAGNOSIS — I10 Essential (primary) hypertension: Secondary | ICD-10-CM | POA: Diagnosis not present

## 2019-10-05 DIAGNOSIS — G4733 Obstructive sleep apnea (adult) (pediatric): Secondary | ICD-10-CM | POA: Diagnosis not present

## 2019-10-05 DIAGNOSIS — K219 Gastro-esophageal reflux disease without esophagitis: Secondary | ICD-10-CM | POA: Diagnosis not present

## 2019-10-05 DIAGNOSIS — R718 Other abnormality of red blood cells: Secondary | ICD-10-CM | POA: Diagnosis not present

## 2019-10-05 DIAGNOSIS — Z8546 Personal history of malignant neoplasm of prostate: Secondary | ICD-10-CM | POA: Diagnosis not present

## 2019-10-07 ENCOUNTER — Ambulatory Visit (INDEPENDENT_AMBULATORY_CARE_PROVIDER_SITE_OTHER): Payer: Medicare Other | Admitting: *Deleted

## 2019-10-07 DIAGNOSIS — I471 Supraventricular tachycardia: Secondary | ICD-10-CM

## 2019-10-07 LAB — CUP PACEART REMOTE DEVICE CHECK
Battery Remaining Longevity: 122 mo
Battery Remaining Percentage: 95.5 %
Battery Voltage: 2.99 V
Brady Statistic AP VP Percent: 14 %
Brady Statistic AP VS Percent: 11 %
Brady Statistic AS VP Percent: 16 %
Brady Statistic AS VS Percent: 58 %
Brady Statistic RA Percent Paced: 25 %
Brady Statistic RV Percent Paced: 30 %
Date Time Interrogation Session: 20210430020013
Implantable Lead Implant Date: 20061201
Implantable Lead Implant Date: 20061201
Implantable Lead Location: 753859
Implantable Lead Location: 753860
Implantable Pulse Generator Implant Date: 20160614
Lead Channel Impedance Value: 540 Ohm
Lead Channel Impedance Value: 610 Ohm
Lead Channel Pacing Threshold Amplitude: 0.375 V
Lead Channel Pacing Threshold Amplitude: 0.5 V
Lead Channel Pacing Threshold Pulse Width: 0.5 ms
Lead Channel Pacing Threshold Pulse Width: 0.5 ms
Lead Channel Sensing Intrinsic Amplitude: 12 mV
Lead Channel Sensing Intrinsic Amplitude: 3.7 mV
Lead Channel Setting Pacing Amplitude: 2 V
Lead Channel Setting Pacing Amplitude: 2 V
Lead Channel Setting Pacing Pulse Width: 0.5 ms
Lead Channel Setting Sensing Sensitivity: 2 mV
Pulse Gen Model: 2240
Pulse Gen Serial Number: 7768207

## 2019-10-07 NOTE — Progress Notes (Signed)
PPM Remote  

## 2019-10-10 ENCOUNTER — Telehealth: Payer: Self-pay

## 2019-10-10 NOTE — Telephone Encounter (Signed)
-----   Message from Dorisann Frames, RN sent at 10/10/2019 10:57 AM EDT -----  ----- Message ----- From: Franchot Gallo, MD Sent: 10/06/2019  10:15 AM EDT To: Dorisann Frames, RN    Notify patient that PSA is still 0 ----- Message ----- From: Dorisann Frames, RN Sent: 10/05/2019  11:41 AM EDT To: Franchot Gallo, MD  Please review

## 2019-10-10 NOTE — Telephone Encounter (Signed)
Pt wife notified of results  

## 2019-10-27 DIAGNOSIS — Z8546 Personal history of malignant neoplasm of prostate: Secondary | ICD-10-CM | POA: Diagnosis not present

## 2019-10-27 DIAGNOSIS — K5900 Constipation, unspecified: Secondary | ICD-10-CM | POA: Diagnosis not present

## 2019-10-27 DIAGNOSIS — E1165 Type 2 diabetes mellitus with hyperglycemia: Secondary | ICD-10-CM | POA: Diagnosis not present

## 2019-10-27 DIAGNOSIS — R718 Other abnormality of red blood cells: Secondary | ICD-10-CM | POA: Diagnosis not present

## 2019-10-27 DIAGNOSIS — K219 Gastro-esophageal reflux disease without esophagitis: Secondary | ICD-10-CM | POA: Diagnosis not present

## 2019-10-27 DIAGNOSIS — D649 Anemia, unspecified: Secondary | ICD-10-CM | POA: Diagnosis not present

## 2019-10-27 DIAGNOSIS — E785 Hyperlipidemia, unspecified: Secondary | ICD-10-CM | POA: Diagnosis not present

## 2019-10-27 DIAGNOSIS — I10 Essential (primary) hypertension: Secondary | ICD-10-CM | POA: Diagnosis not present

## 2019-10-27 DIAGNOSIS — G4733 Obstructive sleep apnea (adult) (pediatric): Secondary | ICD-10-CM | POA: Diagnosis not present

## 2019-11-09 ENCOUNTER — Ambulatory Visit: Payer: Medicare Other | Admitting: Family Medicine

## 2019-11-18 ENCOUNTER — Other Ambulatory Visit: Payer: Self-pay | Admitting: Cardiovascular Disease

## 2020-01-06 ENCOUNTER — Ambulatory Visit (INDEPENDENT_AMBULATORY_CARE_PROVIDER_SITE_OTHER): Payer: Medicare Other | Admitting: *Deleted

## 2020-01-06 DIAGNOSIS — I495 Sick sinus syndrome: Secondary | ICD-10-CM | POA: Diagnosis not present

## 2020-01-06 LAB — CUP PACEART REMOTE DEVICE CHECK
Battery Remaining Longevity: 117 mo
Battery Remaining Percentage: 95.5 %
Battery Voltage: 2.98 V
Brady Statistic AP VP Percent: 17 %
Brady Statistic AP VS Percent: 9.9 %
Brady Statistic AS VP Percent: 21 %
Brady Statistic AS VS Percent: 51 %
Brady Statistic RA Percent Paced: 27 %
Brady Statistic RV Percent Paced: 39 %
Date Time Interrogation Session: 20210730020015
Implantable Lead Implant Date: 20061201
Implantable Lead Implant Date: 20061201
Implantable Lead Location: 753859
Implantable Lead Location: 753860
Implantable Pulse Generator Implant Date: 20160614
Lead Channel Impedance Value: 480 Ohm
Lead Channel Impedance Value: 540 Ohm
Lead Channel Pacing Threshold Amplitude: 0.375 V
Lead Channel Pacing Threshold Amplitude: 0.5 V
Lead Channel Pacing Threshold Pulse Width: 0.5 ms
Lead Channel Pacing Threshold Pulse Width: 0.5 ms
Lead Channel Sensing Intrinsic Amplitude: 1.2 mV
Lead Channel Sensing Intrinsic Amplitude: 11.7 mV
Lead Channel Setting Pacing Amplitude: 2 V
Lead Channel Setting Pacing Amplitude: 2 V
Lead Channel Setting Pacing Pulse Width: 0.5 ms
Lead Channel Setting Sensing Sensitivity: 2 mV
Pulse Gen Model: 2240
Pulse Gen Serial Number: 7768207

## 2020-01-10 NOTE — Progress Notes (Signed)
Remote pacemaker transmission.   

## 2020-01-13 DIAGNOSIS — R718 Other abnormality of red blood cells: Secondary | ICD-10-CM | POA: Diagnosis not present

## 2020-01-13 DIAGNOSIS — E1165 Type 2 diabetes mellitus with hyperglycemia: Secondary | ICD-10-CM | POA: Diagnosis not present

## 2020-01-13 DIAGNOSIS — K219 Gastro-esophageal reflux disease without esophagitis: Secondary | ICD-10-CM | POA: Diagnosis not present

## 2020-01-13 DIAGNOSIS — Z8546 Personal history of malignant neoplasm of prostate: Secondary | ICD-10-CM | POA: Diagnosis not present

## 2020-01-13 DIAGNOSIS — D649 Anemia, unspecified: Secondary | ICD-10-CM | POA: Diagnosis not present

## 2020-01-13 DIAGNOSIS — I10 Essential (primary) hypertension: Secondary | ICD-10-CM | POA: Diagnosis not present

## 2020-01-13 DIAGNOSIS — G4733 Obstructive sleep apnea (adult) (pediatric): Secondary | ICD-10-CM | POA: Diagnosis not present

## 2020-01-13 DIAGNOSIS — E785 Hyperlipidemia, unspecified: Secondary | ICD-10-CM | POA: Diagnosis not present

## 2020-01-13 DIAGNOSIS — K5900 Constipation, unspecified: Secondary | ICD-10-CM | POA: Diagnosis not present

## 2020-01-18 DIAGNOSIS — E1165 Type 2 diabetes mellitus with hyperglycemia: Secondary | ICD-10-CM | POA: Diagnosis not present

## 2020-01-18 DIAGNOSIS — D649 Anemia, unspecified: Secondary | ICD-10-CM | POA: Diagnosis not present

## 2020-01-18 DIAGNOSIS — I10 Essential (primary) hypertension: Secondary | ICD-10-CM | POA: Diagnosis not present

## 2020-01-18 DIAGNOSIS — G4733 Obstructive sleep apnea (adult) (pediatric): Secondary | ICD-10-CM | POA: Diagnosis not present

## 2020-01-18 DIAGNOSIS — Z8546 Personal history of malignant neoplasm of prostate: Secondary | ICD-10-CM | POA: Diagnosis not present

## 2020-01-18 DIAGNOSIS — E785 Hyperlipidemia, unspecified: Secondary | ICD-10-CM | POA: Diagnosis not present

## 2020-01-18 DIAGNOSIS — K219 Gastro-esophageal reflux disease without esophagitis: Secondary | ICD-10-CM | POA: Diagnosis not present

## 2020-01-18 DIAGNOSIS — R718 Other abnormality of red blood cells: Secondary | ICD-10-CM | POA: Diagnosis not present

## 2020-01-18 DIAGNOSIS — K5900 Constipation, unspecified: Secondary | ICD-10-CM | POA: Diagnosis not present

## 2020-01-19 ENCOUNTER — Ambulatory Visit (INDEPENDENT_AMBULATORY_CARE_PROVIDER_SITE_OTHER): Payer: Medicare Other | Admitting: Cardiovascular Disease

## 2020-01-19 ENCOUNTER — Other Ambulatory Visit: Payer: Self-pay

## 2020-01-19 ENCOUNTER — Encounter: Payer: Self-pay | Admitting: Cardiovascular Disease

## 2020-01-19 ENCOUNTER — Telehealth: Payer: Self-pay | Admitting: *Deleted

## 2020-01-19 VITALS — BP 150/85 | HR 89 | Ht 69.0 in | Wt 184.4 lb

## 2020-01-19 DIAGNOSIS — Z8679 Personal history of other diseases of the circulatory system: Secondary | ICD-10-CM | POA: Diagnosis not present

## 2020-01-19 DIAGNOSIS — E1149 Type 2 diabetes mellitus with other diabetic neurological complication: Secondary | ICD-10-CM

## 2020-01-19 DIAGNOSIS — I69359 Hemiplegia and hemiparesis following cerebral infarction affecting unspecified side: Secondary | ICD-10-CM

## 2020-01-19 DIAGNOSIS — E7800 Pure hypercholesterolemia, unspecified: Secondary | ICD-10-CM | POA: Diagnosis not present

## 2020-01-19 DIAGNOSIS — I495 Sick sinus syndrome: Secondary | ICD-10-CM

## 2020-01-19 DIAGNOSIS — I10 Essential (primary) hypertension: Secondary | ICD-10-CM | POA: Diagnosis not present

## 2020-01-19 DIAGNOSIS — I471 Supraventricular tachycardia: Secondary | ICD-10-CM

## 2020-01-19 DIAGNOSIS — I441 Atrioventricular block, second degree: Secondary | ICD-10-CM | POA: Diagnosis not present

## 2020-01-19 DIAGNOSIS — I672 Cerebral atherosclerosis: Secondary | ICD-10-CM

## 2020-01-19 DIAGNOSIS — Z950 Presence of cardiac pacemaker: Secondary | ICD-10-CM

## 2020-01-19 LAB — PACEMAKER DEVICE OBSERVATION

## 2020-01-19 MED ORDER — CHLORTHALIDONE 25 MG PO TABS
12.5000 mg | ORAL_TABLET | Freq: Every day | ORAL | 3 refills | Status: DC
Start: 2020-01-19 — End: 2020-05-08

## 2020-01-19 MED ORDER — FUROSEMIDE 40 MG PO TABS: ORAL_TABLET | ORAL | 0 refills | Status: DC

## 2020-01-19 NOTE — Patient Instructions (Addendum)
Medication Instructions:  STOP the Hydralazine START Chlorthalidone 12. 5 mg (half of a 25 mg tablet) once daily CHANGE how you take the Furosemide to as needed for swelling.  *If you need a refill on your cardiac medications before your next appointment, please call your pharmacy*   Lab Work: None ordered If you have labs (blood work) drawn today and your tests are completely normal, you will receive your results only by: Marland Kitchen MyChart Message (if you have MyChart) OR . A paper copy in the mail If you have any lab test that is abnormal or we need to change your treatment, we will call you to review the results.   Testing/Procedures: None ordered   Follow-Up: At [facility], you and your health needs are our priority.  As part of our continuing mission to provide you with exceptional heart care, we have created designated Provider Care Teams.  These Care Teams include your primary Cardiologist (physician) and Advanced Practice Providers (APPs -  Physician Assistants and Nurse Practitioners) who all work together to provide you with the care you need, when you need it.  We recommend signing up for the patient portal called "MyChart".  Sign up information is provided on this After Visit Summary.  MyChart is used to connect with patients for Virtual Visits (Telemedicine).  Patients are able to view lab/test results, encounter notes, upcoming appointments, etc.  Non-urgent messages can be sent to your provider as well.   To learn more about what you can do with MyChart, go to [email or web address].    Your next appointment:   Follow up in 3 months with an APP for a virtual appointment Follow up in 6 months on a pacer day with Dr. Sallyanne Kuster.   Dr. Sallyanne Kuster would like you to check your blood pressure daily for the next 2 weeks.  Keep a journal of these daily blood pressure and heart rate readings and call our office or send a message through [place] with the results. Thank you! [place] number:  [id number]

## 2020-01-19 NOTE — Progress Notes (Addendum)
Cardiology Office Note    Date:  01/20/2020   ID:  Richard TOLSON, DOB [birth year], MRN [id number]  PCP:  Maryruth Hancock, MD  Cardiologist:   [name], MD   Chief Complaint  Patient presents with  . Pacemaker Check    History of Present Illness:  Richard Alvarado is a 79 y.o. male with second-degree atrioventricular block returning for pacemaker follow-up. He has hypertension, hyperlipidemia, DM and history of remote stroke. He had the pacemaker initially implanted in 2006 and underwent a pacemaker generator change out in June 2016 ([place]).    He has been having problems with blood pressure control recently.  His blood pressure has been high and there have been multiple changes in his medications.  Most recently, hydralazine 25 mg 4 times daily was started by his primary care provider.  He is already on maximal doses of amlodipine and losartan a very high dose of clonidine 0.3 mg 3 times daily and nebivolol 20 mg once daily.  He does complain of orthostatic dizziness occasionally, is sleepy all day long and has a dry mouth,  The patient specifically denies any chest pain at rest exertion, dyspnea at rest or with exertion, orthopnea, paroxysmal nocturnal dyspnea, syncope, palpitations, focal neurological deficits, intermittent claudication, lower extremity edema, unexplained weight gain, cough, hemoptysis or wheezing.  On initial evaluation today's blood pressure was 150/85, even after 50 minutes of rest his blood pressure was still relatively high at 135/90.  Most recent labs performed in April showed creatinine of 1.17, potassium 3.6, hemoglobin 12.1, hemoglobin A1c 6.6%, total cholesterol 126, triglycerides 81, HDL 38, LDL 72.  Pacemaker interrogation shows normal device function with estimated generator longevity of 9.3-10.2 years.  He now has 26 % atrial pacing.  There is 38 % ventricular pacing.  As before, he has occasional episodes of competitive pacing that  cause false mode switch events, but he also has extremely frequent episodes of sustained and usually brief atrial tachycardia.  Most episodes last for a few seconds to a maximum of 2 or 3 minutes.  He did have an episode of atrial tachycardia that lasted for 1 hour 42 minutes on August 6.  Overall the burden of atrial arrhythmia is less than 1% and none of the events look like atrial fibrillation or atrial flutter.  Only 11 of the episodes were associated with high ventricular rates (counters since November 2020).  Today his presenting rhythm was atrial sensed ventricular paced at 96 bpm and appeared to be sinus rhythm, but after threshold testing was performed he converted to atrial paced, ventricular paced rhythm at 60 bpm (underlying rhythm was sinus bradycardia at 50 bpm).  This suggests that he may be having some episodes of ectopic atrial rhythm or atrial reentry at lower rates in the 90s and 100s which would not even be detected by his device.  He has a long-standing history of very severe and hard to control systemic hypertension. At one point he was taking minoxidil but this was stopped for a pericardial effusion. 20 years ago he had a left brain stroke from which she has recovered without sequelae. In October 2017 he had hemorrhage in the right posterior basal ganglia.  CT of the head shows extensive intracranial atherosclerosis with dense calcification of the carotid siphons, suspected at least moderate stenosis of the right vertebral artery origin, but no areas of severe stenosis in the circle of Willis.  Pertinent negatives include the absence of coronary insufficiency by  nuclear stress testing and the absence of renal artery stenosis by ultrasonography.    Past Medical History:  Diagnosis Date  . Arthritis   . Cancer [facility]) 2002   prostate  . Cerebral atherosclerosis    CAROTID DOPPLER, 09/07/2009 - RIGHT AND LEFT CCAs-small-moderate amount of irregular mixed density plaque, no significant  evidence of diameter reduction; RIGHT AND LEFT ICAs-moderate amount irregular mixed density plaque, no evidence of significant diameter reduction  . CVA (cerebral vascular accident) ([place]) 1994   Left brain  . Diabetes mellitus without complication ([place])   . History of CVA (cerebrovascular accident) 04/24/2013  . Hypercholesterolemia   . Hypertension    RENAL DOPPLER, 03/08/2009 - normal  . Hypertension in pregnancy, preeclampsia, severe 04/24/2013  . Obstructive sleep apnea 09/26/2005   AHI-19.46, during REM-36.88  . Pacemaker St. Jude victory, dual chamber, 2006 04/24/2013  . Pericardial effusion    2D ECHO, 03/07/2011 - EF >70%, normal  . Prostate cancer ([place])   . Second degree heart block 04/24/2013    Past Surgical History:  Procedure Laterality Date  . EP IMPLANTABLE DEVICE N/A 11/21/2014   Procedure: PPM/BIV PPM Generator Changeout;  Surgeon: [name], MD;  Location: [facility];  Service: Cardiovascular;  Laterality: N/A;  . EYE SURGERY    . NM MYOVIEW LTD  03/31/2012   Normal study, no ECG changes, EKG negative for ischemia, post-stress EF 54%  . PACEMAKER INSERTION  05/09/2005   St. Jude [place], [place] #[id number], serial [id number], replaced 2016  . PROSTATE SURGERY  2002   removed- cancer    Current Medications: Outpatient Medications Prior to Visit  Medication Sig Dispense Refill  . amLODipine (NORVASC) 10 MG tablet TAKE 1 TABLET BY MOUTH EVERY DAY 90 tablet 2  . atorvastatin (LIPITOR) 80 MG tablet Take 80 mg by mouth at bedtime.    Marland Kitchen BYSTOLIC 20 MG TABS Take 1 tablet daily by mouth.  12  . cloNIDine (CATAPRES) 0.3 MG tablet Take 1 tablet (0.3 mg total) by mouth 3 (three) times daily. 270 tablet 2  . glipiZIDE (GLUCOTROL) 5 MG tablet Take 1 tablet (5 mg total) by mouth 2 (two) times daily before a meal. 60 tablet 0  . losartan (COZAAR) 100 MG tablet Take 1 tablet (100 mg total) by mouth 2 (two) times daily. 60 tablet 0  . metFORMIN (GLUCOPHAGE) 1000 MG tablet Take  1 tablet (1,000 mg total) by mouth 2 (two) times daily with a meal. 60 tablet 0  . pantoprazole (PROTONIX) 40 MG tablet Take 1 tablet (40 mg total) by mouth daily. 30 tablet 0  . senna (SENOKOT) 8.6 MG tablet Take 1 tablet by mouth daily as needed for constipation.    . furosemide (LASIX) 40 MG tablet Take 1 tablet (40 mg total) by mouth daily. 30 tablet 0  . hydrALAZINE (APRESOLINE) 25 MG tablet Take 25 mg by mouth 4 (four) times daily.     No facility-administered medications prior to visit.     Allergies:   Patient has no known allergies.   Social History   Socioeconomic History  . Marital status: Married    Spouse name: [name]  . Number of children: 1  . Years of education: 37  . Highest education level: Not on file  Occupational History  . Occupation: reitred    Comment: Interior and spatial designer, retired Garment/textile technologist  Tobacco Use  . Smoking status: Former Smoker    Quit date: 06/09/1999    Years since quitting: 20.6  .  Smokeless tobacco: Never Used  Substance and Sexual Activity  . Alcohol use: No  . Drug use: No  . Sexual activity: Not Currently  Other Topics Concern  . Not on file  Social History Narrative   Lives with wife   Caffeine free coffee only   Social Determinants of Health   Financial Resource Strain:   . Difficulty of Paying Living Expenses:   Food Insecurity:   . Worried About Charity fundraiser in the Last Year:   . Arboriculturist in the Last Year:   Transportation Needs:   . Film/video editor (Medical):   Marland Kitchen Lack of Transportation (Non-Medical):   Physical Activity:   . Days of Exercise per Week:   . Minutes of Exercise per Session:   Stress:   . Feeling of Stress :   Social Connections:   . Frequency of Communication with Friends and Family:   . Frequency of Social Gatherings with Friends and Family:   . Attends Religious Services:   . Active Member of Clubs or Organizations:   . Attends Archivist Meetings:   Marland Kitchen Marital Status:       Family History:  The patient's family history includes CVA in his mother; Heart attack in his father; Heart disease in his maternal grandmother; Hypertension in his sister; Stroke in his mother.   ROS:   Please see the history of present illness.    ROS All other systems reviewed and are negative.   PHYSICAL EXAM:   VS:  BP (!) 150/85   Pulse 89   Ht 5\' 9"  (1.753 m)   Wt 184 lb 6.4 oz (83.6 kg)   SpO2 98%   BMI 27.23 kg/m      General: Alert, oriented x3, no distress, healthy with subclavian pacemaker site Head: no evidence of trauma, PERRL, EOMI, no exophtalmos or lid lag, no myxedema, no xanthelasma; normal ears, nose and oropharynx Neck: normal jugular venous pulsations and no hepatojugular reflux; brisk carotid pulses without delay and no carotid bruits Chest: clear to auscultation, no signs of consolidation by percussion or palpation, normal fremitus, symmetrical and full respiratory excursions Cardiovascular: normal position and quality of the apical impulse, regular rhythm, normal first and second heart sounds, no murmurs, rubs.  There is an S4 gallop.  He does not have any leg edema. Abdomen: no tenderness or distention, no masses by palpation, no abnormal pulsatility or arterial bruits, normal bowel sounds, no hepatosplenomegaly Extremities: no clubbing, cyanosis or edema; 2+ radial, ulnar and brachial pulses bilaterally; 2+ right femoral, posterior tibial and dorsalis pedis pulses; 2+ left femoral, posterior tibial and dorsalis pedis pulses; no subclavian or femoral bruits Neurological: grossly nonfocal Psych: Normal mood and affect    Wt Readings from Last 3 Encounters:  01/19/20 184 lb 6.4 oz (83.6 kg)  05/11/19 196 lb 9.6 oz (89.2 kg)  05/09/19 194 lb (88 kg)      Studies/Labs Reviewed:   EKG:  EKG is ordered today.  Atrial sensed ventricular paced rhythm with prolonged AV conduction time at 240 ms.  Initial impression was that this represented sinus rhythm, but  after threshold testing was performed the rhythm switch to a much slower sinus bradycardia at 50 bpm.  The initial rhythm was probably some type of atrial reentry tachycardia.  It did not appear consistent with pacemaker mediated tachycardia.  Recent Labs: 05/12/2019: Hemoglobin 12.5; Platelets 309   Lipid Panel    Component Value Date/Time   CHOL  136 12/28/2018 0000   TRIG 136 12/28/2018 0000   HDL 33 (A) 12/28/2018 0000   CHOLHDL 3.3 03/22/2016 0528   VLDL 9 03/22/2016 0528   LDLCALC 80 12/28/2018 0000     ASSESSMENT:    1. SSS (sick sinus syndrome) ([place])   2. Second degree heart block   3. Pacemaker St. Jude dual chamber, 2006, gen change 2018   4. PAT (paroxysmal atrial tachycardia) ([place])   5. Essential hypertension   6. Hypercholesterolemia   7. DM (diabetes mellitus), type 2 with neurological complications ([place])   8. Hemiparesis due to old stroke ([place])   9. History of intracranial hemorrhage   10. Intracranial atherosclerosis      PLAN:  In order of problems listed above:  1. Second degree AV block/SSS: Despite use of very high doses of clonidine and nebivolol he still has a relatively low burden of ventricular pacing at 38%.  Atrial heart rate histograms appear appropriate. 2. PM: Overall normal device function. 3. PAT: He has never been aware of this and does not have palpitations.  One episode of atrial tachycardia lasted for almost 2 hours last week, but for the most part his atrial tachycardia events are very brief, lasting no more than 2 or 3 minutes.  No true atrial fibrillation has ever been documented.  He is on beta-blockers, true antiarrhythmics do not appear to be indicated.. 4. HTN: For years his blood pressure was very well controlled with minoxidil but this was stopped when he developed a pericardial effusion.  He continues to require 4 or 5 different agents for blood pressure control.  We will add chlorthalidone and stop the furosemide, continue high doses  of amlodipine, losartan, nebivolol, clonidine.  Stop the hydralazine.  Hopefully as his blood pressure gets better we can cut back on his clonidine dose.  Reminded him of the risk of rebound with clonidine and Bystolic interruptions.  Asked his wife to send [place] a log of his blood pressure readings in a couple of weeks and we can further titrate the medications based on that data. 5. HLP: LDL cholesterol level is acceptable and improved from last year. 6. DM: Good glycemic control. 7. Remote ischemic CVA and recent ICH: He has barely noticeable residual left hemiparesis as.  He can walk without assistance, without recent falls or injuries. 8. Intracranial atherosclerosis: Reviewed results of previous cranial CT angiography 2017.  Avoid excessively rapid reduction in blood pressure.    Medication Adjustments/Labs and Tests Ordered: Current medicines are reviewed at length with the patient today.  Concerns regarding medicines are outlined above.  Medication changes, Labs and Tests ordered today are listed in the Patient Instructions below. Patient Instructions  Medication Instructions:  STOP the Hydralazine START Chlorthalidone 12. 5 mg (half of a 25 mg tablet) once daily CHANGE how you take the Furosemide to as needed for swelling.  *If you need a refill on your cardiac medications before your next appointment, please call your pharmacy*   Lab Work: None ordered If you have labs (blood work) drawn today and your tests are completely normal, you will receive your results only by: Marland Kitchen MyChart Message (if you have MyChart) OR . A paper copy in the mail If you have any lab test that is abnormal or we need to change your treatment, we will call you to review the results.   Testing/Procedures: None ordered   Follow-Up: At [facility], you and your health needs are our priority.  As part of  our continuing mission to provide you with exceptional heart care, we have created designated Provider  Care Teams.  These Care Teams include your primary Cardiologist (physician) and Advanced Practice Providers (APPs -  Physician Assistants and Nurse Practitioners) who all work together to provide you with the care you need, when you need it.  We recommend signing up for the patient portal called "MyChart".  Sign up information is provided on this After Visit Summary.  MyChart is used to connect with patients for Virtual Visits (Telemedicine).  Patients are able to view lab/test results, encounter notes, upcoming appointments, etc.  Non-urgent messages can be sent to your provider as well.   To learn more about what you can do with MyChart, go to [email or web address].    Your next appointment:   Follow up in 3 months with an APP for a virtual appointment Follow up in 6 months on a pacer day with Dr. Sallyanne Kuster.   Dr. Sallyanne Kuster would like you to check your blood pressure daily for the next 2 weeks.  Keep a journal of these daily blood pressure and heart rate readings and call our office or send a message through [place] with the results. Thank you! FAX number: [phone]     SignedSanda Klein, MD  01/20/2020 4:47 PM    [address], [place], [place]  [address] Phone: [phone]; Fax: [phone]

## 2020-01-19 NOTE — Telephone Encounter (Signed)
The patient's wife called into verify if the patient should be taking Chlorthalidone and Furosemide. The Furosemide will used as needed for swelling. The patient's wife has been made aware and verbalized her understanding.

## 2020-01-23 DIAGNOSIS — E1165 Type 2 diabetes mellitus with hyperglycemia: Secondary | ICD-10-CM | POA: Diagnosis not present

## 2020-01-23 DIAGNOSIS — Z8546 Personal history of malignant neoplasm of prostate: Secondary | ICD-10-CM | POA: Diagnosis not present

## 2020-01-23 DIAGNOSIS — D649 Anemia, unspecified: Secondary | ICD-10-CM | POA: Diagnosis not present

## 2020-01-23 DIAGNOSIS — K219 Gastro-esophageal reflux disease without esophagitis: Secondary | ICD-10-CM | POA: Diagnosis not present

## 2020-01-23 DIAGNOSIS — K5900 Constipation, unspecified: Secondary | ICD-10-CM | POA: Diagnosis not present

## 2020-01-23 DIAGNOSIS — R718 Other abnormality of red blood cells: Secondary | ICD-10-CM | POA: Diagnosis not present

## 2020-01-23 DIAGNOSIS — I10 Essential (primary) hypertension: Secondary | ICD-10-CM | POA: Diagnosis not present

## 2020-01-23 DIAGNOSIS — M1712 Unilateral primary osteoarthritis, left knee: Secondary | ICD-10-CM | POA: Diagnosis not present

## 2020-01-23 DIAGNOSIS — G4733 Obstructive sleep apnea (adult) (pediatric): Secondary | ICD-10-CM | POA: Diagnosis not present

## 2020-01-23 DIAGNOSIS — E785 Hyperlipidemia, unspecified: Secondary | ICD-10-CM | POA: Diagnosis not present

## 2020-02-05 ENCOUNTER — Encounter: Payer: Self-pay | Admitting: Internal Medicine

## 2020-03-01 ENCOUNTER — Ambulatory Visit (INDEPENDENT_AMBULATORY_CARE_PROVIDER_SITE_OTHER): Payer: Medicare Other | Admitting: Cardiovascular Disease

## 2020-03-01 ENCOUNTER — Encounter: Payer: Self-pay | Admitting: Cardiovascular Disease

## 2020-03-01 ENCOUNTER — Other Ambulatory Visit: Payer: Self-pay

## 2020-03-01 VITALS — BP 114/78 | HR 75 | Ht 69.0 in | Wt 183.0 lb

## 2020-03-01 DIAGNOSIS — I10 Essential (primary) hypertension: Secondary | ICD-10-CM

## 2020-03-01 DIAGNOSIS — I1A0 Resistant hypertension: Secondary | ICD-10-CM

## 2020-03-01 LAB — BASIC METABOLIC PANEL
BUN/Creatinine Ratio: 12 (ref 10–24)
BUN: 17 mg/dL (ref 8–27)
CO2: 24 mmol/L (ref 20–29)
Calcium: 9.4 mg/dL (ref 8.6–10.2)
Chloride: 99 mmol/L (ref 96–106)
Creatinine, Ser: 1.43 mg/dL — ABNORMAL HIGH (ref 0.76–1.27)
GFR calc Af Amer: 53 mL/min/{1.73_m2} — ABNORMAL LOW (ref >59)
GFR calc non Af Amer: 46 mL/min/{1.73_m2} — ABNORMAL LOW (ref >59)
Glucose: 146 mg/dL — ABNORMAL HIGH (ref 65–99)
Potassium: 3.6 mmol/L (ref 3.5–5.2)
Sodium: 138 mmol/L (ref 134–144)

## 2020-03-01 LAB — TSH: TSH: 1.87 u[IU]/mL (ref 0.450–4.500)

## 2020-03-01 NOTE — Progress Notes (Signed)
Hypertension Clinic Initial Assessment:    Date:  03/01/2020   ID:  Richard Alvarado, DOB June 09, 1941, MRN [id number]  PCP:  Celene Squibb, MD  Cardiologist:  [name], MD  Nephrologist:  Referring MD: Noreene Larsson, NP   CC: Hypertension  History of Present Illness:    Richard Alvarado is a 79 y.o. male with a hx of diabetes, hypertension, hyperlipidemia, prior stroke, and second-degree AV block status post pacemaker here to establish care in the hypertension clinic. Lately he has struggled with his blood pressure.  He saw Dr. Loletha Grayer on 01/2020.  Prior to that he saw his PCP and hydralazine was increased to 25 mg 4 times daily.  He complained of orthostatic dizziness, fatigue, and dry mouth.  In the office with Dr. Lurline Del blood pressure was 150/85 and improved to 135/90 on repeat.  In the past his blood pressure was well-controlled on minoxidil, but this was discontinued due to a pericardial effusion.  Dr. Loletha Grayer added chlorthalidone and he was referred to the advanced hypertension clinic.  Mr. Ambrosia wife notes that his BP has been more elevated in doctor's office but is typically better-controlled at home.  Lately his wife has been helping to make sure he takes his medications.  She noted that he continued taking hyddralazine inadvertantly aven after Dr. Loletha Grayer recommended he stop it.  Lately his blood pressure has ranged 114/64 and the highest 157/64.  They are unsure what it is averaging.  He reports feeling generally well but does not get much exercise that he attributes to his arthritis.  He denies any lower extremity edema.  He has a CPAP machine but does not use it.  His wife notes that he snores occasionally but does not seem to have apnea.  He denies any chest pain or pressure.  His wife reports that he drinks 1-2 Dr. Samson Frederic daily and previously drank more than that.  She does most of the cooking and tries to limit the salt, but she does add sea salt when cooking.  Previous  antihypertensives: Minoxidil-pericardial effusion   Past Medical History:  Diagnosis Date  . Arthritis   . Cancer [facility]) 2002   prostate  . Cerebral atherosclerosis    CAROTID DOPPLER, 09/07/2009 - RIGHT AND LEFT CCAs-small-moderate amount of irregular mixed density plaque, no significant evidence of diameter reduction; RIGHT AND LEFT ICAs-moderate amount irregular mixed density plaque, no evidence of significant diameter reduction  . CVA (cerebral vascular accident) ([place]) 1994   Left brain  . Diabetes mellitus without complication ([place])   . History of CVA (cerebrovascular accident) 04/24/2013  . Hypercholesterolemia   . Hypertension    RENAL DOPPLER, 03/08/2009 - normal  . Hypertension in pregnancy, preeclampsia, severe 04/24/2013  . Obstructive sleep apnea 09/26/2005   AHI-19.46, during REM-36.88  . Pacemaker St. Jude victory, dual chamber, 2006 04/24/2013  . Pericardial effusion    2D ECHO, 03/07/2011 - EF >70%, normal  . Prostate cancer ([place])   . Second degree heart block 04/24/2013    Past Surgical History:  Procedure Laterality Date  . EP IMPLANTABLE DEVICE N/A 11/21/2014   Procedure: PPM/BIV PPM Generator Changeout;  Surgeon: [name], MD;  Location: [facility];  Service: Cardiovascular;  Laterality: N/A;  . EYE SURGERY    . NM MYOVIEW LTD  03/31/2012   Normal study, no ECG changes, EKG negative for ischemia, post-stress EF 54%  . PACEMAKER INSERTION  05/09/2005   St. Jude [place], [place] #[id number], serial #  [id number], replaced 2016  . PROSTATE SURGERY  2002   removed- cancer    Current Medications: Current Meds  Medication Sig  . amLODipine (NORVASC) 10 MG tablet TAKE 1 TABLET BY MOUTH EVERY DAY  . atorvastatin (LIPITOR) 80 MG tablet Take 80 mg by mouth at bedtime.  Marland Kitchen BYSTOLIC 20 MG TABS Take 1 tablet daily by mouth.  . chlorthalidone (HYGROTON) 25 MG tablet Take 0.5 tablets (12.5 mg total) by mouth daily.  . cloNIDine (CATAPRES) 0.3 MG tablet Take 1 tablet (0.3  mg total) by mouth 3 (three) times daily.  Marland Kitchen glipiZIDE (GLUCOTROL) 5 MG tablet Take 1 tablet (5 mg total) by mouth 2 (two) times daily before a meal.  . losartan (COZAAR) 100 MG tablet Take 100 mg by mouth daily.  . metFORMIN (GLUCOPHAGE) 1000 MG tablet Take 1 tablet (1,000 mg total) by mouth 2 (two) times daily with a meal.  . pantoprazole (PROTONIX) 40 MG tablet Take 1 tablet (40 mg total) by mouth daily.  . [DISCONTINUED] losartan (COZAAR) 100 MG tablet Take 1 tablet (100 mg total) by mouth 2 (two) times daily.     Allergies:   Minoxidil   Social History   Socioeconomic History  . Marital status: Married    Spouse name: Richard Alvarado  . Number of children: 1  . Years of education: 17  . Highest education level: Not on file  Occupational History  . Occupation: reitred    Comment: Interior and spatial designer, retired Garment/textile technologist  Tobacco Use  . Smoking status: Former Smoker    Quit date: 06/09/1999    Years since quitting: 20.7  . Smokeless tobacco: Never Used  Substance and Sexual Activity  . Alcohol use: No  . Drug use: No  . Sexual activity: Not Currently  Other Topics Concern  . Not on file  Social History Narrative   Lives with wife   Caffeine free coffee only   Social Determinants of Health   Financial Resource Strain:   . Difficulty of Paying Living Expenses: Not on file  Food Insecurity:   . Worried About Charity fundraiser in the Last Year: Not on file  . Ran Out of Food in the Last Year: Not on file  Transportation Needs:   . Lack of Transportation (Medical): Not on file  . Lack of Transportation (Non-Medical): Not on file  Physical Activity:   . Days of Exercise per Week: Not on file  . Minutes of Exercise per Session: Not on file  Stress:   . Feeling of Stress : Not on file  Social Connections:   . Frequency of Communication with Friends and Family: Not on file  . Frequency of Social Gatherings with Friends and Family: Not on file  . Attends Religious Services: Not on file    . Active Member of Clubs or Organizations: Not on file  . Attends Archivist Meetings: Not on file  . Marital Status: Not on file     Family History: The patient's family history includes CVA in his mother; Diabetes in his sister; Heart attack in his father; Heart disease in his maternal grandmother; Hypertension in his sister; Stroke in his father and mother.  ROS:   Please see the history of present illness.    All other systems reviewed and are negative.  EKGs/Labs/Other Studies Reviewed:    EKG:  EKG is not ordered today.    Recent Labs: 05/12/2019: Hemoglobin 12.5; Platelets 309   Recent Lipid Panel    Component  Value Date/Time   CHOL 136 12/28/2018 0000   TRIG 136 12/28/2018 0000   HDL 33 (A) 12/28/2018 0000   CHOLHDL 3.3 03/22/2016 0528   VLDL 9 03/22/2016 0528   LDLCALC 80 12/28/2018 0000    Physical Exam:    VS:  BP 114/78   Pulse 75   Ht 5\' 9"  (1.753 m)   Wt 183 lb (83 kg)   SpO2 99%   BMI 27.02 kg/m  , BMI Body mass index is 27.02 kg/m. GENERAL:  Well appearing HEENT: Pupils equal round and reactive, fundi not visualized, oral mucosa unremarkable NECK:  No jugular venous distention, waveform within normal limits, carotid upstroke brisk and symmetric, no bruits, no thyromegaly LYMPHATICS:  No cervical adenopathy LUNGS:  Clear to auscultation bilaterally HEART:  RRR.  PMI not displaced or sustained,S1 and S2 within normal limits, no S3, no S4, no clicks, no rubs, no murmurs ABD:  Flat, positive bowel sounds normal in frequency in pitch, no bruits, no rebound, no guarding, no midline pulsatile mass, no hepatomegaly, no splenomegaly EXT:  2 plus pulses throughout, trace edema, no cyanosis no clubbing SKIN:  No rashes no nodules NEURO:  Cranial nerves II through XII grossly intact, motor grossly intact throughout PSYCH:  Cognitively intact, oriented to person place and time  ASSESSMENT:    1. Essential hypertension   2. Resistant hypertension    3. Uncontrolled hypertension     PLAN:    # Essential hypertension:   Blood pressures well-controlled today in the office.  They report that it has been poorly controlled at home and labile.  He has been taking all medications as prescribed.  It was noted that he is taking losartan 100 mg twice daily.  Is unlikely that this is helping his blood pressure may be worsening his renal function.  We will check a basic metabolic panel and a TSH today.  He will continue amlodipine, chlorthalidone, clonidine, and nebivolol.  He only takes Lasix as needed very rarely.  We discussed the importance of increasing his exercise.  Given that they live in [place] and are not comfortable driving they will not enroll in our prep program at the [facility] but his wife will make sure he starts walking.  They will track his blood pressure twice daily and have a virtual follow-up in 1 month.  Secondary Causes of Hypertension  Medications/Herbal: OCP, steroids, stimulants, antidepressants, weight loss medication, immune suppressants, NSAIDs, sympathomimetics, alcohol, caffeine, licorice, ginseng, St. John's wort, chemo: will work on limiting Dr. Malachi Bonds Sleep Apnea: Not using CPAP regulaly Renal artery stenosis: Will check renal artery Dopplers Hyperaldosteronism: Defer given that he is on ARB today and doesn't like to drive Hyper/hypothyroidism: Check TSH today Pheochromocytoma: (testing not indicated)  Cushing's syndrome:(testing not indicated)  Coarctation of the aorta: (testing not indicated)   Disposition:    FU with MD/PharmD in 1 month virtually   Medication Adjustments/Labs and Tests Ordered: Current medicines are reviewed at length with the patient today.  Concerns regarding medicines are outlined above.  Orders Placed This Encounter  Procedures  . TSH  . Basic metabolic panel  . VAS US RENAL ARTERY DUPLEX   No orders of the defined types were placed in this encounter.    Signed, Skeet Latch,  MD  03/01/2020 11:45 AM    [place]

## 2020-03-01 NOTE — Patient Instructions (Signed)
Medication Instructions:  DECREASE YOUR LOSARTAN TO 100 MG DAILY    Labwork: BMET/TSH TODAY   Testing/Procedures: Your physician has requested that you have a renal artery duplex. During this test, an ultrasound is used to evaluate blood flow to the kidneys. Allow one hour for this exam. Do not eat after midnight the day before and avoid carbonated beverages. Take your medications as you usually do.   Follow-Up: 04/02/2020 at 1:30 TELEPHONE VISIT WITH DR [facility]    Special Instructions:   MONITOR AND LOG YOUR BLOOD PRESSURE TWICE A DAY, HAVE READINGS AVAILABLE AT FOLLOW UP VISIT  INCREASE YOUR EXERCISE   DASH Eating Plan DASH stands for "Dietary Approaches to Stop Hypertension." The DASH eating plan is a healthy eating plan that has been shown to reduce high blood pressure (hypertension). It may also reduce your risk for type 2 diabetes, heart disease, and stroke. The DASH eating plan may also help with weight loss. What are tips for following this plan?  General guidelines  Avoid eating more than 2,300 mg (milligrams) of salt (sodium) a day. If you have hypertension, you may need to reduce your sodium intake to 1,500 mg a day.  Limit alcohol intake to no more than 1 drink a day for nonpregnant women and 2 drinks a day for men. One drink equals 12 oz of beer, 5 oz of wine, or 1 oz of hard liquor.  Work with your health care provider to maintain a healthy body weight or to lose weight. Ask what an ideal weight is for you.  Get at least 30 minutes of exercise that causes your heart to beat faster (aerobic exercise) most days of the week. Activities may include walking, swimming, or biking.  Work with your health care provider or diet and nutrition specialist (dietitian) to adjust your eating plan to your individual calorie needs. Reading food labels   Check food labels for the amount of sodium per serving. Choose foods with less than 5 percent of the Daily Value of sodium.  Generally, foods with less than 300 mg of sodium per serving fit into this eating plan.  To find whole grains, look for the word "whole" as the first word in the ingredient list. Shopping  Buy products labeled as "low-sodium" or "no salt added."  Buy fresh foods. Avoid canned foods and premade or frozen meals. Cooking  Avoid adding salt when cooking. Use salt-free seasonings or herbs instead of table salt or sea salt. Check with your health care provider or pharmacist before using salt substitutes.  Do not fry foods. Cook foods using healthy methods such as baking, boiling, grilling, and broiling instead.  Cook with heart-healthy oils, such as olive, canola, soybean, or sunflower oil. Meal planning  Eat a balanced diet that includes: ? 5 or more servings of fruits and vegetables each day. At each meal, try to fill half of your plate with fruits and vegetables. ? Up to 6-8 servings of whole grains each day. ? Less than 6 oz of lean meat, poultry, or fish each day. A 3-oz serving of meat is about the same size as a deck of cards. One egg equals 1 oz. ? 2 servings of low-fat dairy each day. ? A serving of nuts, seeds, or beans 5 times each week. ? Heart-healthy fats. Healthy fats called Omega-3 fatty acids are found in foods such as flaxseeds and coldwater fish, like sardines, salmon, and mackerel.  Limit how much you eat of the following: ? Canned or  prepackaged foods. ? Food that is high in trans fat, such as fried foods. ? Food that is high in saturated fat, such as fatty meat. ? Sweets, desserts, sugary drinks, and other foods with added sugar. ? Full-fat dairy products.  Do not salt foods before eating.  Try to eat at least 2 vegetarian meals each week.  Eat more home-cooked food and less restaurant, buffet, and fast food.  When eating at a restaurant, ask that your food be prepared with less salt or no salt, if possible. What foods are recommended? The items listed may not  be a complete list. Talk with your dietitian about what dietary choices are best for you. Grains Whole-grain or whole-wheat bread. Whole-grain or whole-wheat pasta. Brown rice. [place]ena Morrow. Bulgur. Whole-grain and low-sodium cereals. Pita bread. Low-fat, low-sodium crackers. Whole-wheat flour tortillas. Vegetables Fresh or frozen vegetables (raw, steamed, roasted, or grilled). Low-sodium or reduced-sodium tomato and vegetable juice. Low-sodium or reduced-sodium tomato sauce and tomato paste. Low-sodium or reduced-sodium canned vegetables. Fruits All fresh, dried, or frozen fruit. Canned fruit in natural juice (without added sugar). Meat and other protein foods Skinless chicken or [place]. Ground chicken or [place]. Pork with fat trimmed off. Fish and seafood. Egg whites. Dried beans, peas, or lentils. Unsalted nuts, nut butters, and seeds. Unsalted canned beans. Lean cuts of beef with fat trimmed off. Low-sodium, lean deli meat. Dairy Low-fat (1%) or fat-free (skim) milk. Fat-free, low-fat, or reduced-fat cheeses. Nonfat, low-sodium ricotta or cottage cheese. Low-fat or nonfat yogurt. Low-fat, low-sodium cheese. Fats and oils Soft margarine without trans fats. Vegetable oil. Low-fat, reduced-fat, or light mayonnaise and salad dressings (reduced-sodium). Canola, safflower, olive, soybean, and sunflower oils. Avocado. Seasoning and other foods Herbs. Spices. Seasoning mixes without salt. Unsalted popcorn and pretzels. Fat-free sweets. What foods are not recommended? The items listed may not be a complete list. Talk with your dietitian about what dietary choices are best for you. Grains Baked goods made with fat, such as croissants, muffins, or some breads. Dry pasta or rice meal packs. Vegetables Creamed or fried vegetables. Vegetables in a cheese sauce. Regular canned vegetables (not low-sodium or reduced-sodium). Regular canned tomato sauce and paste (not low-sodium or reduced-sodium). Regular  tomato and vegetable juice (not low-sodium or reduced-sodium). Angie Fava. Olives. Fruits Canned fruit in a light or heavy syrup. Fried fruit. Fruit in cream or butter sauce. Meat and other protein foods Fatty cuts of meat. Ribs. Fried meat. Berniece Salines. Sausage. Bologna and other processed lunch meats. Salami. Fatback. Hotdogs. Bratwurst. Salted nuts and seeds. Canned beans with added salt. Canned or smoked fish. Whole eggs or egg yolks. Chicken or [place] with skin. Dairy Whole or 2% milk, cream, and half-and-half. Whole or full-fat cream cheese. Whole-fat or sweetened yogurt. Full-fat cheese. Nondairy creamers. Whipped toppings. Processed cheese and cheese spreads. Fats and oils Butter. Stick margarine. Lard. Shortening. Ghee. Bacon fat. Tropical oils, such as coconut, palm kernel, or palm oil. Seasoning and other foods Salted popcorn and pretzels. Onion salt, garlic salt, seasoned salt, table salt, and sea salt. Worcestershire sauce. Tartar sauce. Barbecue sauce. Teriyaki sauce. Soy sauce, including reduced-sodium. Steak sauce. Canned and packaged gravies. Fish sauce. Oyster sauce. Cocktail sauce. Horseradish that you find on the shelf. Ketchup. Mustard. Meat flavorings and tenderizers. Bouillon cubes. Hot sauce and Tabasco sauce. Premade or packaged marinades. Premade or packaged taco seasonings. Relishes. Regular salad dressings. Where to find more information:  National Heart, Lung, and [place]: https://wilson-eaton.com/  American Heart Association: www.heart.org Summary  The DASH  eating plan is a healthy eating plan that has been shown to reduce high blood pressure (hypertension). It may also reduce your risk for type 2 diabetes, heart disease, and stroke.  With the DASH eating plan, you should limit salt (sodium) intake to 2,300 mg a day. If you have hypertension, you may need to reduce your sodium intake to 1,500 mg a day.  When on the DASH eating plan, aim to eat more fresh fruits and  vegetables, whole grains, lean proteins, low-fat dairy, and heart-healthy fats.  Work with your health care provider or diet and nutrition specialist (dietitian) to adjust your eating plan to your individual calorie needs. This information is not intended to replace advice given to you by your health care provider. Make sure you discuss any questions you have with your health care provider. Document Released: 05/15/2011 Document Revised: 05/08/2017 Document Reviewed: 05/19/2016 Elsevier Patient Education  2020 [employer].

## 2020-03-02 DIAGNOSIS — E1165 Type 2 diabetes mellitus with hyperglycemia: Secondary | ICD-10-CM | POA: Diagnosis not present

## 2020-03-02 DIAGNOSIS — K219 Gastro-esophageal reflux disease without esophagitis: Secondary | ICD-10-CM | POA: Diagnosis not present

## 2020-03-02 DIAGNOSIS — Z8546 Personal history of malignant neoplasm of prostate: Secondary | ICD-10-CM | POA: Diagnosis not present

## 2020-03-02 DIAGNOSIS — G4733 Obstructive sleep apnea (adult) (pediatric): Secondary | ICD-10-CM | POA: Diagnosis not present

## 2020-03-02 DIAGNOSIS — I10 Essential (primary) hypertension: Secondary | ICD-10-CM | POA: Diagnosis not present

## 2020-03-02 DIAGNOSIS — R718 Other abnormality of red blood cells: Secondary | ICD-10-CM | POA: Diagnosis not present

## 2020-03-02 DIAGNOSIS — K5900 Constipation, unspecified: Secondary | ICD-10-CM | POA: Diagnosis not present

## 2020-03-02 DIAGNOSIS — E785 Hyperlipidemia, unspecified: Secondary | ICD-10-CM | POA: Diagnosis not present

## 2020-03-02 DIAGNOSIS — D649 Anemia, unspecified: Secondary | ICD-10-CM | POA: Diagnosis not present

## 2020-03-06 ENCOUNTER — Telehealth: Payer: Self-pay | Admitting: *Deleted

## 2020-03-06 DIAGNOSIS — N289 Disorder of kidney and ureter, unspecified: Secondary | ICD-10-CM

## 2020-03-06 NOTE — Telephone Encounter (Signed)
-----   Message from Skeet Latch, MD sent at 03/05/2020  4:34 PM EDT ----- Kidney function is a little worse.  This may be because of taking the losartan twice daily.  Repeat BMP in a month to see if it is improving.

## 2020-03-06 NOTE — Telephone Encounter (Signed)
Advised wife who was at visit with patient Patient will have labs when he returns for his doppler

## 2020-03-29 ENCOUNTER — Ambulatory Visit (HOSPITAL_COMMUNITY)
Admission: RE | Admit: 2020-03-29 | Discharge: 2020-03-29 | Disposition: A | Discharge status: Home / Self Care | Payer: Medicare Other | Source: Ambulatory Visit | Attending: Cardiovascular Disease | Admitting: Cardiovascular Disease

## 2020-03-29 ENCOUNTER — Other Ambulatory Visit: Payer: Self-pay

## 2020-03-29 DIAGNOSIS — I10 Essential (primary) hypertension: Secondary | ICD-10-CM | POA: Insufficient documentation

## 2020-04-02 ENCOUNTER — Telehealth (INDEPENDENT_AMBULATORY_CARE_PROVIDER_SITE_OTHER): Payer: Medicare Other | Admitting: Cardiovascular Disease

## 2020-04-02 ENCOUNTER — Encounter: Payer: Self-pay | Admitting: Cardiovascular Disease

## 2020-04-02 VITALS — BP 130/74 | HR 63 | Ht 69.0 in | Wt 185.0 lb

## 2020-04-02 DIAGNOSIS — Z5181 Encounter for therapeutic drug level monitoring: Secondary | ICD-10-CM | POA: Diagnosis not present

## 2020-04-02 DIAGNOSIS — I471 Supraventricular tachycardia: Secondary | ICD-10-CM

## 2020-04-02 DIAGNOSIS — I3139 Other pericardial effusion (noninflammatory): Secondary | ICD-10-CM

## 2020-04-02 DIAGNOSIS — I10 Essential (primary) hypertension: Secondary | ICD-10-CM

## 2020-04-02 DIAGNOSIS — I4719 Other supraventricular tachycardia: Secondary | ICD-10-CM

## 2020-04-02 DIAGNOSIS — I313 Pericardial effusion (noninflammatory): Secondary | ICD-10-CM | POA: Diagnosis not present

## 2020-04-02 DIAGNOSIS — G4733 Obstructive sleep apnea (adult) (pediatric): Secondary | ICD-10-CM | POA: Diagnosis not present

## 2020-04-02 MED ORDER — VALSARTAN 320 MG PO TABS: 320.0000 mg | ORAL_TABLET | Freq: Every day | ORAL | 3 refills | Status: DC

## 2020-04-02 NOTE — Progress Notes (Signed)
Hypertension Clinic Follow-up:    Date:  04/02/2020   ID:  Richard Alvarado, DOB [birth year], MRN [id number]  PCP:  Celene Squibb, MD  Cardiologist:  [name], MD   Referring MD: Celene Squibb, MD   CC: Hypertension  History of Present Illness:    Richard Alvarado is a 79 y.o. male with a hx of diabetes, hypertension, hyperlipidemia, prior stroke, and second-degree AV block status post pacemaker here for follow up.  He was initially seen 02/2020 to establish care in the hypertension clinic. Lately he has struggled with his blood pressure.  He saw Dr. Loletha Grayer on 01/2020.  Prior to that he saw his PCP and hydralazine was increased to 25 mg 4 times daily.  He complained of orthostatic dizziness, fatigue, and dry mouth.  In the office with Dr. Lurline Del blood pressure was 150/85 and improved to 135/90 on repeat.  In the past his blood pressure was well-controlled on minoxidil, but this was discontinued due to a pericardial effusion.  Dr. Loletha Grayer added chlorthalidone and he was referred to the advanced hypertension clinic.  At his initial appointment it was noted that he was taking losartan 100 mg twice daily.  This was reduced to once daily due to worsening renal function.  At his initial advanced hypertension clinic visit his blood pressure was actually well-controlled.  It was noted that he was drinking several sodas daily and that was recommended that he reduce this intake.  He was also referred for renal artery Dopplers that demonstrated normal blood flow bilaterally.  We asked him to increase his exercise to 150 minutes weekly.  He has been trying to excise more.  He is walking for about 30 minutes and feels well with exercise.  He has no exertional chest pain or shortness of breath.  In general he has been feeling as well.  His wife checks his blood pressure twice a day.  She checks it in the morning before he gets his morning medications.  It has been mostly in the 140s to 160s.  It tends to get better  throughout the day.  His evening blood pressures are more in the 130s to 140s, though he did have one episode in the 110s.  He has not had any lightheadedness or dizziness.  Previous antihypertensives: Minoxidil-pericardial effusion   Past Medical History:  Diagnosis Date  . Arthritis   . Cancer [facility]) 2002   prostate  . Cerebral atherosclerosis    CAROTID DOPPLER, 09/07/2009 - RIGHT AND LEFT CCAs-small-moderate amount of irregular mixed density plaque, no significant evidence of diameter reduction; RIGHT AND LEFT ICAs-moderate amount irregular mixed density plaque, no evidence of significant diameter reduction  . CVA (cerebral vascular accident) ([place]) 1994   Left brain  . Diabetes mellitus without complication ([place])   . History of CVA (cerebrovascular accident) 04/24/2013  . Hypercholesterolemia   . Hypertension    RENAL DOPPLER, 03/08/2009 - normal  . Hypertension in pregnancy, preeclampsia, severe 04/24/2013  . Obstructive sleep apnea 09/26/2005   AHI-19.46, during REM-36.88  . Pacemaker St. Jude victory, dual chamber, 2006 04/24/2013  . Pericardial effusion    2D ECHO, 03/07/2011 - EF >70%, normal  . Prostate cancer ([place])   . Second degree heart block 04/24/2013    Past Surgical History:  Procedure Laterality Date  . EP IMPLANTABLE DEVICE N/A 11/21/2014   Procedure: PPM/BIV PPM Generator Changeout;  Surgeon: [name], MD;  Location: [place] [facility];  Service: Cardiovascular;  Laterality: N/A;  .  EYE SURGERY    . NM MYOVIEW LTD  03/31/2012   Normal study, no ECG changes, EKG negative for ischemia, post-stress EF 54%  . PACEMAKER INSERTION  05/09/2005   St. Jude [place], [place] #[id number], serial [id number], replaced 2016  . PROSTATE SURGERY  2002   removed- cancer    Current Medications: Current Meds  Medication Sig  . amLODipine (NORVASC) 10 MG tablet TAKE 1 TABLET BY MOUTH EVERY DAY  . atorvastatin (LIPITOR) 80 MG tablet Take 80 mg by mouth at bedtime.  Marland Kitchen BYSTOLIC 20 MG  TABS Take 1 tablet daily by mouth.  . chlorthalidone (HYGROTON) 25 MG tablet Take 0.5 tablets (12.5 mg total) by mouth daily.  . cloNIDine (CATAPRES) 0.3 MG tablet Take 1 tablet (0.3 mg total) by mouth 3 (three) times daily.  Marland Kitchen glipiZIDE (GLUCOTROL) 5 MG tablet Take 1 tablet (5 mg total) by mouth 2 (two) times daily before a meal.  . metFORMIN (GLUCOPHAGE) 1000 MG tablet Take 1 tablet (1,000 mg total) by mouth 2 (two) times daily with a meal.  . pantoprazole (PROTONIX) 40 MG tablet Take 1 tablet (40 mg total) by mouth daily.  . [DISCONTINUED] losartan (COZAAR) 100 MG tablet Take 100 mg by mouth daily.     Allergies:   Minoxidil   Social History   Socioeconomic History  . Marital status: Married    Spouse name: [name]  . Number of children: 1  . Years of education: 67  . Highest education level: Not on file  Occupational History  . Occupation: reitred    Comment: Interior and spatial designer, retired Garment/textile technologist  Tobacco Use  . Smoking status: Former Smoker    Quit date: 06/09/1999    Years since quitting: 20.8  . Smokeless tobacco: Never Used  Substance and Sexual Activity  . Alcohol use: No  . Drug use: No  . Sexual activity: Not Currently  Other Topics Concern  . Not on file  Social History Narrative   Lives with wife   Caffeine free coffee only   Social Determinants of Health   Financial Resource Strain:   . Difficulty of Paying Living Expenses: Not on file  Food Insecurity:   . Worried About Charity fundraiser in the Last Year: Not on file  . Ran Out of Food in the Last Year: Not on file  Transportation Needs:   . Lack of Transportation (Medical): Not on file  . Lack of Transportation (Non-Medical): Not on file  Physical Activity:   . Days of Exercise per Week: Not on file  . Minutes of Exercise per Session: Not on file  Stress:   . Feeling of Stress : Not on file  Social Connections:   . Frequency of Communication with Friends and Family: Not on file  . Frequency of Social  Gatherings with Friends and Family: Not on file  . Attends Religious Services: Not on file  . Active Member of Clubs or Organizations: Not on file  . Attends Archivist Meetings: Not on file  . Marital Status: Not on file     Family History: The patient's family history includes CVA in his mother; Diabetes in his sister; Heart attack in his father; Heart disease in his maternal grandmother; Hypertension in his sister; Stroke in his father and mother.  ROS:   Please see the history of present illness.    All other systems reviewed and are negative.  EKGs/Labs/Other Studies Reviewed:    EKG:  EKG is not ordered today.  Recent Labs: 05/12/2019: Hemoglobin 12.5; Platelets 309 03/01/2020: BUN 17; Creatinine, Ser 1.43; Potassium 3.6; Sodium 138; TSH 1.870   Recent Lipid Panel    Component Value Date/Time   CHOL 136 12/28/2018 0000   TRIG 136 12/28/2018 0000   HDL 33 (A) 12/28/2018 0000   CHOLHDL 3.3 03/22/2016 0528   VLDL 9 03/22/2016 0528   LDLCALC 80 12/28/2018 0000    Physical Exam:    BP 130/74   Pulse 63   Ht 5\' 9"  (1.753 m)   Wt 185 lb (83.9 kg)   BMI 27.32 kg/m  GENERAL: Sounds well.  No acute distress. RESP: No respiratory distress NEURO:  Speech fluent.   PSYCH:  Cognitively intact, oriented to person place and time  ASSESSMENT:    1. Essential hypertension   2. Therapeutic drug monitoring   3. PAT (paroxysmal atrial tachycardia) ([place])   4. Pericardial effusion secondary to minoxidil   5. OSA (obstructive sleep apnea)     PLAN:    # Essential hypertension:  Blood pressure has been labile.  It is mostly uncontrolled still.  We will stop losartan and switch to valsartan and 320 mg daily.  He was congratulated on his exercise.  We will continue with clonidine, chlorthalidone, amlodipine.  He will need to have a basic metabolic panel checked in a week.  He lives in [place] and is not very comfortable driving.  Therefore we will follow-up virtually  as much as possible.  Secondary Causes of Hypertension  Medications/Herbal: OCP, steroids, stimulants, antidepressants, weight loss medication, immune suppressants, NSAIDs, sympathomimetics, alcohol, caffeine, licorice, ginseng, St. John's wort, chemo: will work on limiting Dr. Malachi Bonds Sleep Apnea: Not using CPAP regulaly Renal artery stenosis: Negative renal artery Dopplers 03/2020 Hyperaldosteronism: Defer given that he is on ARB today and doesn't like to drive Hyper/hypothyroidism: Check TSH today Pheochromocytoma: (testing not indicated)  Cushing's syndrome:(testing not indicated)  Coarctation of the aorta: (testing not indicated)   Disposition:    FU with MD/PharmD in 1 month virtually   Medication Adjustments/Labs and Tests Ordered: Current medicines are reviewed at length with the patient today.  Concerns regarding medicines are outlined above.   Orders Placed This Encounter  Procedures  . Basic metabolic panel   Meds ordered this encounter  Medications  . valsartan (DIOVAN) 320 MG tablet    Sig: Take 1 tablet (320 mg total) by mouth daily.    Dispense:  90 tablet    Refill:  3    D/C LOSARTAN     Signed, Skeet Latch, MD  04/02/2020 2:27 PM    [place]

## 2020-04-02 NOTE — Patient Instructions (Addendum)
Medication Instructions:  STOP LOSARTAN  START VALSARTAN 320 MG DAILY   Labwork: BMET IN 1 WEEK   Testing/Procedures: NONE  Follow-Up:   1 MONTH 05/07/2020 10:30 AM, VIRTUAL VISIT WITH DR [facility]

## 2020-04-03 ENCOUNTER — Encounter: Payer: Self-pay | Admitting: Nurse Practitioner

## 2020-04-03 ENCOUNTER — Other Ambulatory Visit: Payer: Self-pay

## 2020-04-03 ENCOUNTER — Ambulatory Visit (INDEPENDENT_AMBULATORY_CARE_PROVIDER_SITE_OTHER): Payer: Medicare Other | Admitting: Nurse Practitioner

## 2020-04-03 DIAGNOSIS — I10 Essential (primary) hypertension: Secondary | ICD-10-CM | POA: Diagnosis not present

## 2020-04-03 DIAGNOSIS — R195 Other fecal abnormalities: Secondary | ICD-10-CM | POA: Diagnosis not present

## 2020-04-03 DIAGNOSIS — K5900 Constipation, unspecified: Secondary | ICD-10-CM | POA: Diagnosis not present

## 2020-04-03 DIAGNOSIS — E785 Hyperlipidemia, unspecified: Secondary | ICD-10-CM | POA: Diagnosis not present

## 2020-04-03 DIAGNOSIS — R718 Other abnormality of red blood cells: Secondary | ICD-10-CM | POA: Diagnosis not present

## 2020-04-03 DIAGNOSIS — D649 Anemia, unspecified: Secondary | ICD-10-CM | POA: Diagnosis not present

## 2020-04-03 DIAGNOSIS — G4733 Obstructive sleep apnea (adult) (pediatric): Secondary | ICD-10-CM | POA: Diagnosis not present

## 2020-04-03 DIAGNOSIS — E1165 Type 2 diabetes mellitus with hyperglycemia: Secondary | ICD-10-CM | POA: Diagnosis not present

## 2020-04-03 DIAGNOSIS — Z8546 Personal history of malignant neoplasm of prostate: Secondary | ICD-10-CM | POA: Diagnosis not present

## 2020-04-03 DIAGNOSIS — K219 Gastro-esophageal reflux disease without esophagitis: Secondary | ICD-10-CM | POA: Diagnosis not present

## 2020-04-03 NOTE — Patient Instructions (Signed)
Your health issues we discussed today were:   Blood in your stool: 1. As we discussed, without indication of where the bleeding may be coming from we will plan for both procedures 2. We will schedule a colonoscopy and upper endoscopy with Dr. Gala Romney 3. We will have you take half a dose of glipizide the night before your procedures and no glipizide the morning of your procedure.  We will have you take no Metformin the morning of your procedure.  Read your prep instructions for further information 4. Monitor for any obvious bleeding and notify us if you see any 5. Let us know if you have any worsening fatigue, weakness, dizziness, lightheadedness, chest pain, shortness of breath, etc. 6. Further recommendations will follow your procedure  Overall I recommend:  1. Continue your other current medications 2. Return for follow-up in 3 months 3. Call if you have any questions or concerns   ---------------------------------------------------------------  I am glad you have gotten your COVID-19 vaccination!  Even though you are fully vaccinated you should continue to follow CDC and state/local guidelines.  ---------------------------------------------------------------   At [facility] we value your feedback. You may receive a survey about your visit today. Please share your experience as we strive to create trusting relationships with our patients to provide genuine, compassionate, quality care.  We appreciate your understanding and patience as we review any laboratory studies, imaging, and other diagnostic tests that are ordered as we care for you. Our office policy is 5 business days for review of these results, and any emergent or urgent results are addressed in a timely manner for your best interest. If you do not hear from our office in 1 week, please contact us.   We also encourage the use of MyChart, which contains your medical information for your review as well. If you are  not enrolled in this feature, an access code is on this after visit summary for your convenience. Thank you for allowing [place] to be involved in your care.  It was great to see you today!  I hope you have a great Fall!!

## 2020-04-03 NOTE — Progress Notes (Signed)
CC'ED TO PCP 

## 2020-04-03 NOTE — Progress Notes (Signed)
Primary Care Physician:  Celene Squibb, MD Primary Gastroenterologist:  Dr. Gala Romney  Chief Complaint  Patient presents with  . Blood In Stools    cards +, never had tcs    HPI:   Richard Alvarado is a 79 y.o. male who presents on referral from primary care for heme positive stool.  Reviewed information provided with referral including note on referral indicating positive stool cards and noted blood in stool.  Office visit dated 01/23/2020 which was a follow-up for blood work.  Has had minor COVID-19 vaccine x2 doses.  Has previously had a colonoscopy, but cannot remember the date.  Iron panel at that time looked good.  GERD well managed on pantoprazole 40 mg daily.  Constipation well managed on senna.  Hemoglobin mildly low at 11.411 2021.  Noted to be microcytic and hypochromic.  However, iron 55, saturation normal at 26%, ferritin normal at 309.  No history of colonoscopy or endoscopy found in our system.  Today he states doing okay overall.States he doesn't think he's had a colonoscopy before. He denies seeing any blood in his stool, but stool cards were positive x 3. Denies melena. Denies abdominal pain, N/V, fever, chills, unintentional weight loss. Denies URI or flu-like symptoms. Denies loss of sense of taste or smell. The patient has received COVID-19 vaccination(s). Denies chest pain, dyspnea, dizziness, lightheadedness, syncope, near syncope. Denies any other upper or lower GI symptoms.  He has been having BP issues, was sent to htn clinic and they are adjusting medications. GERD under control on Protonix.  Past Medical History:  Diagnosis Date  . Arthritis   . Cancer [facility]) 2002   prostate  . Cerebral atherosclerosis    CAROTID DOPPLER, 09/07/2009 - RIGHT AND LEFT CCAs-small-moderate amount of irregular mixed density plaque, no significant evidence of diameter reduction; RIGHT AND LEFT ICAs-moderate amount irregular mixed density plaque, no evidence of significant diameter  reduction  . CVA (cerebral vascular accident) ([place]) 1994   Left brain  . Diabetes mellitus without complication ([place])   . History of CVA (cerebrovascular accident) 04/24/2013  . Hypercholesterolemia   . Hypertension    RENAL DOPPLER, 03/08/2009 - normal  . Hypertension in pregnancy, preeclampsia, severe 04/24/2013  . Obstructive sleep apnea 09/26/2005   AHI-19.46, during REM-36.88  . Pacemaker St. Jude victory, dual chamber, 2006 04/24/2013  . Pericardial effusion    2D ECHO, 03/07/2011 - EF >70%, normal  . Prostate cancer ([place])   . Second degree heart block 04/24/2013    Past Surgical History:  Procedure Laterality Date  . EP IMPLANTABLE DEVICE N/A 11/21/2014   Procedure: PPM/BIV PPM Generator Changeout;  Surgeon: [name], MD;  Location: [facility];  Service: Cardiovascular;  Laterality: N/A;  . EYE SURGERY    . NM MYOVIEW LTD  03/31/2012   Normal study, no ECG changes, EKG negative for ischemia, post-stress EF 54%  . PACEMAKER INSERTION  05/09/2005   St. Jude [place], [place] #[id number], serial [id number], replaced 2016  . PROSTATE SURGERY  2002   removed- cancer    Current Outpatient Medications  Medication Sig Dispense Refill  . amLODipine (NORVASC) 10 MG tablet TAKE 1 TABLET BY MOUTH EVERY DAY 90 tablet 2  . atorvastatin (LIPITOR) 80 MG tablet Take 80 mg by mouth at bedtime.    . chlorthalidone (HYGROTON) 25 MG tablet Take 0.5 tablets (12.5 mg total) by mouth daily. 15 tablet 3  . cloNIDine (CATAPRES) 0.3 MG tablet Take 1 tablet (0.3 mg  total) by mouth 3 (three) times daily. 270 tablet 2  . glipiZIDE (GLUCOTROL) 5 MG tablet Take 1 tablet (5 mg total) by mouth 2 (two) times daily before a meal. 60 tablet 0  . metFORMIN (GLUCOPHAGE) 1000 MG tablet Take 1 tablet (1,000 mg total) by mouth 2 (two) times daily with a meal. 60 tablet 0  . pantoprazole (PROTONIX) 40 MG tablet Take 1 tablet (40 mg total) by mouth daily. 30 tablet 0  . valsartan (DIOVAN) 320 MG tablet Take 1  tablet (320 mg total) by mouth daily. 90 tablet 3   No current facility-administered medications for this visit.    Allergies as of 04/03/2020 - Review Complete 04/03/2020  Allergen Reaction Noted  . Minoxidil  03/01/2020    Family History  Problem Relation Age of Onset  . CVA Mother   . Stroke Mother   . Heart attack Father   . Stroke Father   . Hypertension Sister   . Diabetes Sister   . Heart disease Maternal Grandmother   . Colon cancer Neg Hx   . Gastric cancer Neg Hx   . Esophageal cancer Neg Hx     Social History   Socioeconomic History  . Marital status: Married    Spouse name: [name]  . Number of children: 1  . Years of education: 71  . Highest education level: Not on file  Occupational History  . Occupation: reitred    Comment: Interior and spatial designer, retired Garment/textile technologist  Tobacco Use  . Smoking status: Former Smoker    Quit date: 06/09/1999    Years since quitting: 20.8  . Smokeless tobacco: Never Used  Substance and Sexual Activity  . Alcohol use: No  . Drug use: No  . Sexual activity: Not Currently  Other Topics Concern  . Not on file  Social History Narrative   Lives with wife   Caffeine free coffee only   Social Determinants of Health   Financial Resource Strain:   . Difficulty of Paying Living Expenses: Not on file  Food Insecurity:   . Worried About Charity fundraiser in the Last Year: Not on file  . Ran Out of Food in the Last Year: Not on file  Transportation Needs:   . Lack of Transportation (Medical): Not on file  . Lack of Transportation (Non-Medical): Not on file  Physical Activity:   . Days of Exercise per Week: Not on file  . Minutes of Exercise per Session: Not on file  Stress:   . Feeling of Stress : Not on file  Social Connections:   . Frequency of Communication with Friends and Family: Not on file  . Frequency of Social Gatherings with Friends and Family: Not on file  . Attends Religious Services: Not on file  . Active Member of  Clubs or Organizations: Not on file  . Attends Archivist Meetings: Not on file  . Marital Status: Not on file  Intimate Partner Violence:   . Fear of Current or Ex-Partner: Not on file  . Emotionally Abused: Not on file  . Physically Abused: Not on file  . Sexually Abused: Not on file    Subjective: Review of Systems  Constitutional: Negative for chills, fever, malaise/fatigue and weight loss.  HENT: Negative for congestion and sore throat.   Respiratory: Negative for cough and shortness of breath.   Cardiovascular: Negative for chest pain and palpitations.  Gastrointestinal: Negative for abdominal pain, blood in stool, diarrhea, heartburn, melena, nausea and vomiting.  Musculoskeletal: Negative for joint pain and myalgias.  Skin: Negative for rash.  Neurological: Negative for dizziness and weakness.  Endo/Heme/Allergies: Does not bruise/bleed easily.  Psychiatric/Behavioral: Negative for depression. The patient is not nervous/anxious.   All other systems reviewed and are negative.      Objective: BP (!) 173/88   Pulse 75   Temp (!) 96.8 F (36 C) (Temporal)   Ht 5\' 9"  (1.753 m)   Wt 183 lb 12.8 oz (83.4 kg)   BMI 27.14 kg/m  Physical Exam Vitals and nursing note reviewed.  Constitutional:      General: He is not in acute distress.    Appearance: Normal appearance. He is normal weight. He is not ill-appearing, toxic-appearing or diaphoretic.  HENT:     Head: Normocephalic and atraumatic.     Nose: No congestion or rhinorrhea.  Eyes:     General: No scleral icterus. Cardiovascular:     Rate and Rhythm: Normal rate and regular rhythm.     Heart sounds: Normal heart sounds.     Comments: PPM noted right upper chest Pulmonary:     Effort: Pulmonary effort is normal.     Breath sounds: Normal breath sounds.  Abdominal:     General: Bowel sounds are normal. There is no distension.     Palpations: Abdomen is soft. There is no hepatomegaly, splenomegaly or  mass.     Tenderness: There is no abdominal tenderness. There is no guarding or rebound.     Hernia: No hernia is present.  Musculoskeletal:     Cervical back: Neck supple.  Skin:    General: Skin is warm and dry.     Coloration: Skin is not jaundiced.     Findings: No bruising or rash.  Neurological:     General: No focal deficit present.     Mental Status: He is alert and oriented to person, place, and time. Mental status is at baseline.  Psychiatric:        Mood and Affect: Mood normal.        Behavior: Behavior normal.        Thought Content: Thought content normal.      Assessment:  Very pleasant 79 year old male who presents for heme positive stool with primary care.  He states he has never had a colonoscopy before, never had endoscopy before.  No obvious hematochezia or melena.  Does have mild anemia with hemoglobin 11.1 and hypochromic/microcytic; however iron is normal.  Heme positive stool: It is very concerning that he has never had a colonoscopy before.  However, with no obvious source for indication of source for bleeding such as hematochezia or melena, we will plan for colonoscopy and upper endoscopy to further evaluate for GI bleed etiology.  Denies any other red flag/warning signs or symptoms such as worsening fatigue, weight loss, etc.  Most likely etiology includes gastritis, esophagitis, duodenitis (especially in the setting of GERD well managed on Protonix), erosion, ulcerations, AVMs, hemorrhoids, bleeding polyp.  Cannot rule out more insidious pathology such as malignancy.  Further recommendations will follow procedure.  Proceed with colonoscopy and upper endoscopy by Dr. Gala Romney in near future: the risks, benefits, and alternatives have been discussed with the patient in detail. The patient states understanding and desires to proceed.  The patient is currently on glipizide and Metformin. The patient is not on any other anticoagulants, anxiolytics, chronic pain  medications, antidepressants, antidiabetics, or iron supplements.  Conscious sedation should be adequate for his procedure.   Plan:  1. Colonoscopy and endoscopy as per above 2. Monitor for obvious bleeding 3. Notify us of any obvious bleeding 4. Follow-up in 3 months 5. Further recommendations to follow    Thank you for allowing [place] to participate in the care of Pollyann Glen, DNP, AGNP-C Adult & Gerontological Nurse Practitioner [facility]   04/03/2020 2:56 PM   Disclaimer: This note was dictated with voice recognition software. Similar sounding words can inadvertently be transcribed and may not be corrected upon review.

## 2020-04-06 ENCOUNTER — Ambulatory Visit (INDEPENDENT_AMBULATORY_CARE_PROVIDER_SITE_OTHER): Payer: Medicare Other

## 2020-04-06 DIAGNOSIS — R55 Syncope and collapse: Secondary | ICD-10-CM | POA: Diagnosis not present

## 2020-04-06 LAB — CUP PACEART REMOTE DEVICE CHECK
Battery Remaining Longevity: 112 mo
Battery Remaining Percentage: 95.5 %
Battery Voltage: 2.98 V
Brady Statistic AP VP Percent: 39 %
Brady Statistic AP VS Percent: 8.2 %
Brady Statistic AS VP Percent: 24 %
Brady Statistic AS VS Percent: 28 %
Brady Statistic RA Percent Paced: 47 %
Brady Statistic RV Percent Paced: 63 %
Date Time Interrogation Session: 20211029020013
Implantable Lead Implant Date: 20061201
Implantable Lead Implant Date: 20061201
Implantable Lead Location: 753859
Implantable Lead Location: 753860
Implantable Pulse Generator Implant Date: 20160614
Lead Channel Impedance Value: 440 Ohm
Lead Channel Impedance Value: 550 Ohm
Lead Channel Pacing Threshold Amplitude: 0.375 V
Lead Channel Pacing Threshold Amplitude: 0.75 V
Lead Channel Pacing Threshold Pulse Width: 0.5 ms
Lead Channel Pacing Threshold Pulse Width: 0.5 ms
Lead Channel Sensing Intrinsic Amplitude: 1 mV
Lead Channel Sensing Intrinsic Amplitude: 10.3 mV
Lead Channel Setting Pacing Amplitude: 2 V
Lead Channel Setting Pacing Amplitude: 2 V
Lead Channel Setting Pacing Pulse Width: 0.5 ms
Lead Channel Setting Sensing Sensitivity: 2 mV
Pulse Gen Model: 2240
Pulse Gen Serial Number: 7768207

## 2020-04-10 NOTE — Progress Notes (Signed)
Remote pacemaker transmission.   

## 2020-04-12 ENCOUNTER — Telehealth: Payer: Self-pay | Admitting: *Deleted

## 2020-04-12 MED ORDER — DOXAZOSIN MESYLATE 2 MG PO TABS: 2.0000 mg | ORAL_TABLET | Freq: Every day | ORAL | 11 refills | Status: DC

## 2020-04-12 NOTE — Telephone Encounter (Signed)
-----   Message from Skeet Latch, MD sent at 04/12/2020 10:58 AM EDT ----- Patient's BP log reviewed.  His numbers are still running high.  Recommend adding doxazosin 2 mg qhs.

## 2020-04-12 NOTE — Telephone Encounter (Signed)
Left message to call back  

## 2020-04-12 NOTE — Telephone Encounter (Signed)
Patient's wife is returning call. 

## 2020-04-12 NOTE — Telephone Encounter (Signed)
Per message from Dr. Oval Linsey and previous nursing note: "Patient's BP log reviewed.  His numbers are still running high.  Recommend adding doxazosin 2 mg qhs."  Spoke with patient. Reviewed recommendation. Patient verbalized understanding. Prescription sent in.

## 2020-04-14 DIAGNOSIS — Z23 Encounter for immunization: Secondary | ICD-10-CM | POA: Diagnosis not present

## 2020-04-24 ENCOUNTER — Telehealth: Payer: Medicare Other | Admitting: Physician Assistant

## 2020-04-25 ENCOUNTER — Telehealth: Payer: Medicare Other | Admitting: Physician Assistant

## 2020-04-25 DIAGNOSIS — K219 Gastro-esophageal reflux disease without esophagitis: Secondary | ICD-10-CM | POA: Diagnosis not present

## 2020-04-25 DIAGNOSIS — K5900 Constipation, unspecified: Secondary | ICD-10-CM | POA: Diagnosis not present

## 2020-04-25 DIAGNOSIS — D649 Anemia, unspecified: Secondary | ICD-10-CM | POA: Diagnosis not present

## 2020-04-25 DIAGNOSIS — E1165 Type 2 diabetes mellitus with hyperglycemia: Secondary | ICD-10-CM | POA: Diagnosis not present

## 2020-04-25 DIAGNOSIS — G4733 Obstructive sleep apnea (adult) (pediatric): Secondary | ICD-10-CM | POA: Diagnosis not present

## 2020-04-25 DIAGNOSIS — I10 Essential (primary) hypertension: Secondary | ICD-10-CM | POA: Diagnosis not present

## 2020-04-25 DIAGNOSIS — Z8546 Personal history of malignant neoplasm of prostate: Secondary | ICD-10-CM | POA: Diagnosis not present

## 2020-04-25 DIAGNOSIS — E785 Hyperlipidemia, unspecified: Secondary | ICD-10-CM | POA: Diagnosis not present

## 2020-04-25 DIAGNOSIS — R718 Other abnormality of red blood cells: Secondary | ICD-10-CM | POA: Diagnosis not present

## 2020-04-29 NOTE — Progress Notes (Signed)
Virtual Visit via Telephone Note   This visit type was conducted due to national recommendations for restrictions regarding the COVID-19 Pandemic (e.g. social distancing) in an effort to limit this patient's exposure and mitigate transmission in our community.  Due to his co-morbid illnesses, this patient is at least at moderate risk for complications without adequate follow up.  This format is felt to be most appropriate for this patient at this time.  The patient did not have access to video technology/had technical difficulties with video requiring transitioning to audio format only (telephone).  All issues noted in this document were discussed and addressed.  No physical exam could be performed with this format.  Please refer to the patient's chart for his  consent to telehealth for [facility].    Date:  04/30/2020   ID:  YERICK EGGEBRECHT, DOB Mar 11, 1941, MRN [id number] The patient was identified using 2 identifiers.  Patient Location: Home Provider Location: Home Office  PCP:  Celene Squibb, MD  Cardiologist:  [name], MD  Electrophysiologist:  None   Evaluation Performed:  Follow-Up Visit  Chief Complaint:  Follow-up blood pressures  History of Present Illness:    LENNIN OSMOND is a 79 y.o. male with a PMH of HTN, HLD, DM type 2, 2nd degree AV block s/p PPM, and CVA, who presents for 3 month follow-up.  He was evaluated by Dr. Sallyanne Kuster 01/2020 for follow-up of his HTN. In the past his blood pressure was well controlled with minoxidil, however this was discontinued when he developed a pericardial effusion. He was started on chlorthalidone in addition to losartan which was reduced to 100mg  daily from 100mg  BID in the setting of worsening renal function. He was referred to the HTN clinic, seen 03/01/20 and 04/02/20 by Dr. Oval Linsey. He underwent a renal artery doppler which did not demonstrate any renal artery stenosis. At his last visit his losartan was changed to  valsartan 320mg  daily, in addition to his clonidine, chlorthalidone, and amlodipine. Home BP readings were reviewed on 04/12/20 and a telephone encounter indicates they were above goal and he was recommended to start doxazosin 2mg  qHS.  He presents today for follow-up of his high blood pressure. Wife is on the line as well. They have continued to monitor his blood pressure morning and night. Over the past couple weeks he has had one isolated elevated reading of 158/72, though over the past few days readings have predominately been in the 100s/60s-120s/70s. He reports feeling well. They have noticed his blood pressure is better on days when he is more active. We discussed trying to get daily exercise. No complaints of chest pain, SOB, DOE, palpitations, dizziness, lightheadedness, syncope, LE edema, orthopnea, or PND.    The patient does have symptoms concerning for COVID-19 infection (fever, chills, cough, or new shortness of breath).    Past Medical History:  Diagnosis Date  . Arthritis   . Cancer [facility]) 2002   prostate  . Cerebral atherosclerosis    CAROTID DOPPLER, 09/07/2009 - RIGHT AND LEFT CCAs-small-moderate amount of irregular mixed density plaque, no significant evidence of diameter reduction; RIGHT AND LEFT ICAs-moderate amount irregular mixed density plaque, no evidence of significant diameter reduction  . CVA (cerebral vascular accident) ([place]) 1994   Left brain  . Diabetes mellitus without complication ([place])   . History of CVA (cerebrovascular accident) 04/24/2013  . Hypercholesterolemia   . Hypertension    RENAL DOPPLER, 03/08/2009 - normal  . Hypertension in pregnancy, preeclampsia, severe 04/24/2013  .  Obstructive sleep apnea 09/26/2005   AHI-19.46, during REM-36.88  . Pacemaker St. Jude victory, dual chamber, 2006 04/24/2013  . Pericardial effusion    2D ECHO, 03/07/2011 - EF >70%, normal  . Prostate cancer ([place])   . Second degree heart block 04/24/2013   Past Surgical History:   Procedure Laterality Date  . EP IMPLANTABLE DEVICE N/A 11/21/2014   Procedure: PPM/BIV PPM Generator Changeout;  Surgeon: [name], MD;  Location: [place] [facility];  Service: Cardiovascular;  Laterality: N/A;  . EYE SURGERY    . NM MYOVIEW LTD  03/31/2012   Normal study, no ECG changes, EKG negative for ischemia, post-stress EF 54%  . PACEMAKER INSERTION  05/09/2005   St. Jude [place], [place] #[id number], serial [id number], replaced 2016  . PROSTATE SURGERY  2002   removed- cancer     Current Meds  Medication Sig  . amLODipine (NORVASC) 10 MG tablet TAKE 1 TABLET BY MOUTH EVERY DAY  . atorvastatin (LIPITOR) 80 MG tablet Take 80 mg by mouth at bedtime.  . cloNIDine (CATAPRES) 0.3 MG tablet Take 1 tablet (0.3 mg total) by mouth 3 (three) times daily.  Marland Kitchen doxazosin (CARDURA) 2 MG tablet Take 1 tablet (2 mg total) by mouth daily.  Marland Kitchen glipiZIDE (GLUCOTROL) 5 MG tablet Take 1 tablet (5 mg total) by mouth 2 (two) times daily before a meal.  . metFORMIN (GLUCOPHAGE) 1000 MG tablet Take 1 tablet (1,000 mg total) by mouth 2 (two) times daily with a meal.  . pantoprazole (PROTONIX) 40 MG tablet Take 1 tablet (40 mg total) by mouth daily.  . valsartan (DIOVAN) 320 MG tablet Take 1 tablet (320 mg total) by mouth daily.     Allergies:   Minoxidil   Social History   Tobacco Use  . Smoking status: Former Smoker    Quit date: 06/09/1999    Years since quitting: 20.9  . Smokeless tobacco: Never Used  Substance Use Topics  . Alcohol use: No  . Drug use: No     Family Hx: The patient's family history includes CVA in his mother; Diabetes in his sister; Heart attack in his father; Heart disease in his maternal grandmother; Hypertension in his sister; Stroke in his father and mother. There is no history of Colon cancer, Gastric cancer, or Esophageal cancer.  ROS:   Please see the history of present illness.     All other systems reviewed and are negative.   Prior CV studies:   The following  studies were reviewed today:  Echocardiogram 2017: - Left ventricle: The cavity size was normal. Wall thickness was  increased in a pattern of moderate LVH. Systolic function was  normal. The estimated ejection fraction was in the range of 60%  to 65%. Wall motion was normal; there were no regional wall  motion abnormalities. Doppler parameters are consistent with  abnormal left ventricular relaxation (grade 1 diastolic  dysfunction).  - Mitral valve: Mildly calcified leaflets .  - Right ventricle: Pacer wire or catheter noted in right ventricle.  - Right atrium: Pacer wire or catheter noted in right atrium.  - Atrial septum: No defect or patent foramen ovale was identified.   Labs/Other Tests and Data Reviewed:    EKG:  No ECG reviewed.  Recent Labs: 05/12/2019: Hemoglobin 12.5; Platelets 309 03/01/2020: BUN 17; Creatinine, Ser 1.43; Potassium 3.6; Sodium 138; TSH 1.870   Recent Lipid Panel Lab Results  Component Value Date/Time   CHOL 136 12/28/2018 12:00 AM   TRIG 136  12/28/2018 12:00 AM   HDL 33 (A) 12/28/2018 12:00 AM   CHOLHDL 3.3 03/22/2016 05:28 AM   LDLCALC 80 12/28/2018 12:00 AM    Wt Readings from Last 3 Encounters:  04/03/20 183 lb 12.8 oz (83.4 kg)  04/02/20 185 lb (83.9 kg)  03/01/20 183 lb (83 kg)     Risk Assessment/Calculations:      Objective:    Vital Signs:  There were no vitals taken for this visit.   VITAL SIGNS:  reviewed GEN:  no acute distress RESPIRATORY:  speaking in full sentences without SOB CARDIOVASCULAR:  no peripheral edema NEURO:  A&O x3 PSYCH:  normal affect  ASSESSMENT & PLAN:    1. HTN: BP 132/78 today through predominately well controlled over the past few days at 100s-120s/ 60s-70s.  - Continue amlodipine, clonidine, chlorthalidone, valsartan, and doxazosin.  - Patient to notify the office if blood pressures trend upward, at which time his doxazosin could be increased if needed.   2. HLD: LDL 80 12/2018; at  goal of <100 - Continue atorvastatin 80mg  daily  3. DM type 2: A1C 6.3 12/2018; at goal of <7 - Continue metformin and glipizide per PCP  4. 2nd degree AV block s/p PPM: last device interrogation 04/06/20 showed normal function without significant arrhythmias - Continue routine monitoring per Dr. Loletha Grayer   Shared Decision Making/Informed Consent        COVID-19 Education: The signs and symptoms of COVID-19 were discussed with the patient and how to seek care for testing (follow up with PCP or arrange E-visit).  The importance of social distancing was discussed today.  Time:   Today, I have spent 8 minutes with the patient with telehealth technology discussing the above problems.     Medication Adjustments/Labs and Tests Ordered: Current medicines are reviewed at length with the patient today.  Concerns regarding medicines are outlined above.   Tests Ordered: No orders of the defined types were placed in this encounter.   Medication Changes: No orders of the defined types were placed in this encounter.   Follow Up:  In Person in 3 month(s)  Signed, Abigail Butts, PA-C  04/30/2020 4:26 PM    [place]

## 2020-04-30 ENCOUNTER — Telehealth (INDEPENDENT_AMBULATORY_CARE_PROVIDER_SITE_OTHER): Payer: Medicare Other | Admitting: Medical

## 2020-04-30 ENCOUNTER — Telehealth: Payer: Self-pay

## 2020-04-30 DIAGNOSIS — I441 Atrioventricular block, second degree: Secondary | ICD-10-CM

## 2020-04-30 DIAGNOSIS — E119 Type 2 diabetes mellitus without complications: Secondary | ICD-10-CM | POA: Diagnosis not present

## 2020-04-30 DIAGNOSIS — E7800 Pure hypercholesterolemia, unspecified: Secondary | ICD-10-CM | POA: Diagnosis not present

## 2020-04-30 DIAGNOSIS — I10 Essential (primary) hypertension: Secondary | ICD-10-CM

## 2020-04-30 NOTE — Progress Notes (Signed)
TY, KK. I miss his minoxidil.Marland KitchenMarland Kitchen

## 2020-04-30 NOTE — Patient Instructions (Signed)
Medication Instructions:  No Changes *If you need a refill on your cardiac medications before your next appointment, please call your pharmacy*   Lab Work: No Labs If you have labs (blood work) drawn today and your tests are completely normal, you will receive your results only by: Marland Kitchen MyChart Message (if you have MyChart) OR . A paper copy in the mail If you have any lab test that is abnormal or we need to change your treatment, we will call you to review the results.   Testing/Procedures: No Testing Ordered   Follow-Up: At [facility], you and your health needs are our priority.  As part of our continuing mission to provide you with exceptional heart care, we have created designated Provider Care Teams.  These Care Teams include your primary Cardiologist (physician) and Advanced Practice Providers (APPs -  Physician Assistants and Nurse Practitioners) who all work together to provide you with the care you need, when you need it.  We recommend signing up for the patient portal called "MyChart".  Sign up information is provided on this After Visit Summary.  MyChart is used to connect with patients for Virtual Visits (Telemedicine).  Patients are able to view lab/test results, encounter notes, upcoming appointments, etc.  Non-urgent messages can be sent to your provider as well.   To learn more about what you can do with MyChart, go to [email or web address].    Your next appointment:   3 month(s)  The format for your next appointment:   In Person  Provider:   [name], MD   Other Instructions Follow a Low Sodium diet, Daily Exercise. Monitor Blood Pressure. If Blood Pressure Greater that 135/80 Please call Our Office

## 2020-04-30 NOTE — Telephone Encounter (Signed)
Spoke with Richard Alvarado. Reveiwed After Visit Summary. Advise patient will mail copy of summary

## 2020-05-01 DIAGNOSIS — E1165 Type 2 diabetes mellitus with hyperglycemia: Secondary | ICD-10-CM | POA: Diagnosis not present

## 2020-05-01 DIAGNOSIS — K219 Gastro-esophageal reflux disease without esophagitis: Secondary | ICD-10-CM | POA: Diagnosis not present

## 2020-05-01 DIAGNOSIS — K5900 Constipation, unspecified: Secondary | ICD-10-CM | POA: Diagnosis not present

## 2020-05-01 DIAGNOSIS — I10 Essential (primary) hypertension: Secondary | ICD-10-CM | POA: Diagnosis not present

## 2020-05-01 DIAGNOSIS — D649 Anemia, unspecified: Secondary | ICD-10-CM | POA: Diagnosis not present

## 2020-05-01 DIAGNOSIS — E785 Hyperlipidemia, unspecified: Secondary | ICD-10-CM | POA: Diagnosis not present

## 2020-05-01 DIAGNOSIS — Z8546 Personal history of malignant neoplasm of prostate: Secondary | ICD-10-CM | POA: Diagnosis not present

## 2020-05-01 DIAGNOSIS — G4733 Obstructive sleep apnea (adult) (pediatric): Secondary | ICD-10-CM | POA: Diagnosis not present

## 2020-05-01 DIAGNOSIS — R718 Other abnormality of red blood cells: Secondary | ICD-10-CM | POA: Diagnosis not present

## 2020-05-07 ENCOUNTER — Telehealth (INDEPENDENT_AMBULATORY_CARE_PROVIDER_SITE_OTHER): Payer: Medicare Other | Admitting: Cardiovascular Disease

## 2020-05-07 ENCOUNTER — Encounter: Payer: Self-pay | Admitting: Cardiovascular Disease

## 2020-05-07 VITALS — BP 162/74 | HR 66 | Ht 69.0 in | Wt 185.0 lb

## 2020-05-07 DIAGNOSIS — I10 Essential (primary) hypertension: Secondary | ICD-10-CM | POA: Diagnosis not present

## 2020-05-07 MED ORDER — DOXAZOSIN MESYLATE 2 MG PO TABS
ORAL_TABLET | ORAL | 11 refills | Status: DC
Start: 2020-05-07 — End: 2021-05-08

## 2020-05-07 NOTE — Patient Instructions (Addendum)
Medication Instructions:  INCREASE YOUR DOXAZOSIN TO 4 MG AT BEDTIME UNTIL YOUR PROCEDURE. AFTER  PROCEDURE GO BACK TO 2 MG    Labwork: NONE   Testing/Procedures: NONE   Follow-Up: 1 MONTH 05/31/2020 AT 9:40 AM TELEPHONE VISIT WITH DR [place]   SPECIAL INSTRUCTIONS:  CONTINUE TO MONITOR YOUR BLOOD PRESSURE AT HOME. IF IT REMAINS HIGH AFTER PROCEDURE CALL THE OFFICE [phone] TO DISCUSS

## 2020-05-07 NOTE — Progress Notes (Signed)
Virtual Visit via Telephone Note   This visit type was conducted due to national recommendations for restrictions regarding the COVID-19 Pandemic (e.g. social distancing) in an effort to limit this patient's exposure and mitigate transmission in our community.  Due to his co-morbid illnesses, this patient is at least at moderate risk for complications without adequate follow up.  This format is felt to be most appropriate for this patient at this time.  The patient did not have access to video technology/had technical difficulties with video requiring transitioning to audio format only (telephone).  All issues noted in this document were discussed and addressed.  No physical exam could be performed with this format.  Please refer to the patient's chart for his  consent to telehealth for [facility].   The patient was identified using 2 identifiers.  Date:  05/07/2020   ID:  Richard Alvarado, DOB 07/25/1940, MRN [id number]  Patient Location: Home Provider Location: Office/Clinic  PCP:  Celene Squibb, MD  Cardiologist:  [name], MD  Electrophysiologist:  None   Evaluation Performed:  Follow-Up Visit  Chief Complaint:  hypertension  Hypertension Clinic Follow-up:    Date:  05/07/2020   ID:  Richard Alvarado, DOB [birth year], MRN [id number]  PCP:  Celene Squibb, MD  Cardiologist:  [name], MD   Referring MD: Celene Squibb, MD   CC: Hypertension  History of Present Illness:    Richard Alvarado is a 78 y.o. male with a hx of diabetes, hypertension, hyperlipidemia, prior stroke, and second-degree AV block status post pacemaker here for follow up.  He was initially seen 02/2020 to establish care in the hypertension clinic. Lately he has struggled with his blood pressure.  He saw Dr. Loletha Grayer on 01/2020.  Prior to that he saw his PCP and hydralazine was increased to 25 mg 4 times daily.  He complained of orthostatic dizziness, fatigue, and dry mouth.  In the office with Dr. Lurline Del  blood pressure was 150/85 and improved to 135/90 on repeat.  In the past his blood pressure was well-controlled on minoxidil, but this was discontinued due to a pericardial effusion.  Dr. Loletha Grayer added chlorthalidone and he was referred to the advanced hypertension clinic.  At his initial appointment it was noted that he was taking losartan 100 mg twice daily.  This was reduced to once daily due to worsening renal function.  At his initial advanced hypertension clinic visit his blood pressure was actually well-controlled.  It was noted that he was drinking several sodas daily and that was recommended that he reduce this intake.  He was also referred for renal artery Dopplers that demonstrated normal blood flow bilaterally.  We asked him to increase his exercise to 150 minutes weekly.  He has been trying to excise more.  He is walking for about 30 minutes and feels well with exercise.  He has no exertional chest pain or shortness of breath.  In general he has been feeling as well.  His wife checks his blood pressure twice a day.  She checks it in the morning before he gets his morning medications.  It has been mostly in the 140s to 160s.  It tends to get better throughout the day.  His evening blood pressures are more in the 130s to 140s, though he did have one episode in the 110s.  He has not had any lightheadedness or dizziness.  At his last appointment his blood pressure remained poorly controlled.  Losartan was  switched to valsartan.  Since then his blood pressure continued to remain high so doxazosin was called into the pharmacy.  He had a virtual follow-up with our APP 11/22 and his blood pressure was better controlled.  Since then his BP has been fluctuating.  It has been high since he saw his gastroenterologist and was scheduled for a colonoscopy.  His wife thinks that his BP has been more unstable because he is anxious about the procedure.  He isn't eating well and has been anxious.  He has otherwise been doing  well.    Previous antihypertensives: Minoxidil-pericardial effusion   Past Medical History:  Diagnosis Date  . Arthritis   . Cancer [facility]) 2002   prostate  . Cerebral atherosclerosis    CAROTID DOPPLER, 09/07/2009 - RIGHT AND LEFT CCAs-small-moderate amount of irregular mixed density plaque, no significant evidence of diameter reduction; RIGHT AND LEFT ICAs-moderate amount irregular mixed density plaque, no evidence of significant diameter reduction  . CVA (cerebral vascular accident) ([place]) 1994   Left brain  . Diabetes mellitus without complication ([place])   . History of CVA (cerebrovascular accident) 04/24/2013  . Hypercholesterolemia   . Hypertension    RENAL DOPPLER, 03/08/2009 - normal  . Hypertension in pregnancy, preeclampsia, severe 04/24/2013  . Obstructive sleep apnea 09/26/2005   AHI-19.46, during REM-36.88  . Pacemaker St. Jude victory, dual chamber, 2006 04/24/2013  . Pericardial effusion    2D ECHO, 03/07/2011 - EF >70%, normal  . Prostate cancer ([place])   . Second degree heart block 04/24/2013    Past Surgical History:  Procedure Laterality Date  . EP IMPLANTABLE DEVICE N/A 11/21/2014   Procedure: PPM/BIV PPM Generator Changeout;  Surgeon: [name], MD;  Location: [place] [facility];  Service: Cardiovascular;  Laterality: N/A;  . EYE SURGERY    . NM MYOVIEW LTD  03/31/2012   Normal study, no ECG changes, EKG negative for ischemia, post-stress EF 54%  . PACEMAKER INSERTION  05/09/2005   St. Jude [place], [place] #[id number], serial [id number], replaced 2016  . PROSTATE SURGERY  2002   removed- cancer    Current Medications: Current Meds  Medication Sig  . acetaminophen (TYLENOL) 500 MG tablet Take 1,000 mg by mouth every 6 (six) hours as needed (for pain.).  Marland Kitchen amLODipine (NORVASC) 10 MG tablet TAKE 1 TABLET BY MOUTH EVERY DAY (Patient taking differently: Take 10 mg by mouth daily. )  . atorvastatin (LIPITOR) 80 MG tablet Take 80 mg by mouth at bedtime.  .  chlorthalidone (HYGROTON) 25 MG tablet Take 0.5 tablets (12.5 mg total) by mouth daily.  . cloNIDine (CATAPRES) 0.3 MG tablet Take 1 tablet (0.3 mg total) by mouth 3 (three) times daily.  Marland Kitchen doxazosin (CARDURA) 2 MG tablet TAKE 2 TABLETS FOR 5 DAYS AND THEN RESUME 1 DAILY  . glipiZIDE (GLUCOTROL) 5 MG tablet Take 1 tablet (5 mg total) by mouth 2 (two) times daily before a meal.  . metFORMIN (GLUCOPHAGE) 1000 MG tablet Take 1 tablet (1,000 mg total) by mouth 2 (two) times daily with a meal.  . pantoprazole (PROTONIX) 40 MG tablet Take 1 tablet (40 mg total) by mouth daily.  . valsartan (DIOVAN) 320 MG tablet Take 1 tablet (320 mg total) by mouth daily.  . [DISCONTINUED] doxazosin (CARDURA) 2 MG tablet Take 1 tablet (2 mg total) by mouth daily. (Patient taking differently: Take 2 mg by mouth at bedtime. )     Allergies:   Minoxidil   Social History   Socioeconomic  History  . Marital status: Married    Spouse name: [name]  . Number of children: 1  . Years of education: 16  . Highest education level: Not on file  Occupational History  . Occupation: reitred    Comment: Interior and spatial designer, retired Garment/textile technologist  Tobacco Use  . Smoking status: Former Smoker    Quit date: 06/09/1999    Years since quitting: 20.9  . Smokeless tobacco: Never Used  Substance and Sexual Activity  . Alcohol use: No  . Drug use: No  . Sexual activity: Not Currently  Other Topics Concern  . Not on file  Social History Narrative   Lives with wife   Caffeine free coffee only   Social Determinants of Health   Financial Resource Strain:   . Difficulty of Paying Living Expenses: Not on file  Food Insecurity:   . Worried About Charity fundraiser in the Last Year: Not on file  . Ran Out of Food in the Last Year: Not on file  Transportation Needs:   . Lack of Transportation (Medical): Not on file  . Lack of Transportation (Non-Medical): Not on file  Physical Activity:   . Days of Exercise per Week: Not on file  .  Minutes of Exercise per Session: Not on file  Stress:   . Feeling of Stress : Not on file  Social Connections:   . Frequency of Communication with Friends and Family: Not on file  . Frequency of Social Gatherings with Friends and Family: Not on file  . Attends Religious Services: Not on file  . Active Member of Clubs or Organizations: Not on file  . Attends Archivist Meetings: Not on file  . Marital Status: Not on file     Family History: The patient's family history includes CVA in his mother; Diabetes in his sister; Heart attack in his father; Heart disease in his maternal grandmother; Hypertension in his sister; Stroke in his father and mother. There is no history of Colon cancer, Gastric cancer, or Esophageal cancer.  ROS:   Please see the history of present illness.    All other systems reviewed and are negative.  EKGs/Labs/Other Studies Reviewed:    EKG:  EKG is not ordered today.    Recent Labs: 05/12/2019: Hemoglobin 12.5; Platelets 309 03/01/2020: BUN 17; Creatinine, Ser 1.43; Potassium 3.6; Sodium 138; TSH 1.870   Recent Lipid Panel    Component Value Date/Time   CHOL 136 12/28/2018 0000   TRIG 136 12/28/2018 0000   HDL 33 (A) 12/28/2018 0000   CHOLHDL 3.3 03/22/2016 0528   VLDL 9 03/22/2016 0528   LDLCALC 80 12/28/2018 0000    Physical Exam:    BP (!) 162/74   Pulse 66   Ht 5\' 9"  (1.753 m)   Wt 185 lb (83.9 kg)   BMI 27.32 kg/m  GENERAL: Sounds well.  No acute distress. RESP: No respiratory distress NEURO:  Speech fluent.   PSYCH:  Cognitively intact, oriented to person place and time  ASSESSMENT:    1. Essential hypertension   2. Severe hypertension     PLAN:    # Essential hypertension:  Blood pressure has been much better controlled but has been high lately due to anxiety.  For now we will increase doxazonsin to 4mg .  After his colonoscopy he will go back to 2mg .  He had a BMP with his PCP and will see them tomorrow to review.   Continue amlodipine, chlorthalidone, and clonidine.  He  lives in [place] and is not very comfortable driving.  Therefore we will follow-up virtually as much as possible.  Secondary Causes of Hypertension  Medications/Herbal: OCP, steroids, stimulants, antidepressants, weight loss medication, immune suppressants, NSAIDs, sympathomimetics, alcohol, caffeine, licorice, ginseng, St. John's wort, chemo: will work on limiting Dr. Malachi Bonds Sleep Apnea: Not using CPAP regulaly Renal artery stenosis: Negative renal artery Dopplers 03/2020 Hyperaldosteronism: Defer given that he is on ARB today and doesn't like to drive Hyper/hypothyroidism: TSH normal 02/2020 Pheochromocytoma: (testing not indicated)  Cushing's syndrome:(testing not indicated)  Coarctation of the aorta: (testing not indicated)   Disposition:    FU with MD/PharmD in 1 month virtually   COVID-19 Education: The signs and symptoms of COVID-19 were discussed with the patient and how to seek care for testing (follow up with PCP or arrange E-visit).  The importance of social distancing was discussed today.  Time:   Today, I have spent 25 minutes with the patient with telehealth technology discussing the above problems.    Medication Adjustments/Labs and Tests Ordered: Current medicines are reviewed at length with the patient today.  Concerns regarding medicines are outlined above.   No orders of the defined types were placed in this encounter.  Meds ordered this encounter  Medications  . doxazosin (CARDURA) 2 MG tablet    Sig: TAKE 2 TABLETS FOR 5 DAYS AND THEN RESUME 1 DAILY    Dispense:  35 tablet    Refill:  11     Signed, Skeet Latch, MD  05/07/2020 11:20 AM    [place]

## 2020-05-08 ENCOUNTER — Other Ambulatory Visit: Payer: Self-pay | Admitting: Cardiovascular Disease

## 2020-05-08 DIAGNOSIS — N1831 Chronic kidney disease, stage 3a: Secondary | ICD-10-CM | POA: Diagnosis not present

## 2020-05-08 DIAGNOSIS — F411 Generalized anxiety disorder: Secondary | ICD-10-CM | POA: Diagnosis not present

## 2020-05-08 DIAGNOSIS — D649 Anemia, unspecified: Secondary | ICD-10-CM | POA: Diagnosis not present

## 2020-05-08 DIAGNOSIS — K5900 Constipation, unspecified: Secondary | ICD-10-CM | POA: Diagnosis not present

## 2020-05-08 DIAGNOSIS — G4733 Obstructive sleep apnea (adult) (pediatric): Secondary | ICD-10-CM | POA: Diagnosis not present

## 2020-05-08 DIAGNOSIS — E1165 Type 2 diabetes mellitus with hyperglycemia: Secondary | ICD-10-CM | POA: Diagnosis not present

## 2020-05-08 DIAGNOSIS — R718 Other abnormality of red blood cells: Secondary | ICD-10-CM | POA: Diagnosis not present

## 2020-05-08 DIAGNOSIS — M1712 Unilateral primary osteoarthritis, left knee: Secondary | ICD-10-CM | POA: Diagnosis not present

## 2020-05-08 DIAGNOSIS — I10 Essential (primary) hypertension: Secondary | ICD-10-CM | POA: Diagnosis not present

## 2020-05-08 DIAGNOSIS — E785 Hyperlipidemia, unspecified: Secondary | ICD-10-CM | POA: Diagnosis not present

## 2020-05-08 DIAGNOSIS — Z8546 Personal history of malignant neoplasm of prostate: Secondary | ICD-10-CM | POA: Diagnosis not present

## 2020-05-08 DIAGNOSIS — K219 Gastro-esophageal reflux disease without esophagitis: Secondary | ICD-10-CM | POA: Diagnosis not present

## 2020-05-09 ENCOUNTER — Other Ambulatory Visit: Payer: Self-pay

## 2020-05-09 ENCOUNTER — Other Ambulatory Visit (HOSPITAL_COMMUNITY)
Admission: RE | Admit: 2020-05-09 | Discharge: 2020-05-09 | Disposition: A | Discharge status: Home / Self Care | Payer: Medicare Other | Source: Ambulatory Visit | Attending: Internal Medicine | Admitting: Internal Medicine

## 2020-05-09 DIAGNOSIS — Z01812 Encounter for preprocedural laboratory examination: Secondary | ICD-10-CM | POA: Insufficient documentation

## 2020-05-09 DIAGNOSIS — Z20822 Contact with and (suspected) exposure to covid-19: Secondary | ICD-10-CM | POA: Insufficient documentation

## 2020-05-09 LAB — SARS CORONAVIRUS 2 (TAT 6-24 HRS): SARS Coronavirus 2: NEGATIVE

## 2020-05-10 ENCOUNTER — Encounter: Payer: Self-pay | Admitting: Nurse Practitioner

## 2020-05-11 ENCOUNTER — Ambulatory Visit (HOSPITAL_COMMUNITY)
Admission: RE | Admit: 2020-05-11 | Discharge: 2020-05-11 | Disposition: A | Discharge status: Home / Self Care | Payer: Medicare Other | Attending: Internal Medicine | Admitting: Internal Medicine

## 2020-05-11 ENCOUNTER — Other Ambulatory Visit: Payer: Self-pay

## 2020-05-11 ENCOUNTER — Encounter (HOSPITAL_COMMUNITY)
Admission: RE | Disposition: A | Discharge status: Home / Self Care | Payer: Self-pay | Source: Home / Self Care | Attending: Internal Medicine

## 2020-05-11 ENCOUNTER — Encounter (HOSPITAL_COMMUNITY): Payer: Self-pay | Admitting: Internal Medicine

## 2020-05-11 DIAGNOSIS — Z87891 Personal history of nicotine dependence: Secondary | ICD-10-CM | POA: Diagnosis not present

## 2020-05-11 DIAGNOSIS — Z7984 Long term (current) use of oral hypoglycemic drugs: Secondary | ICD-10-CM | POA: Diagnosis not present

## 2020-05-11 DIAGNOSIS — Z888 Allergy status to other drugs, medicaments and biological substances status: Secondary | ICD-10-CM | POA: Diagnosis not present

## 2020-05-11 DIAGNOSIS — Q438 Other specified congenital malformations of intestine: Secondary | ICD-10-CM | POA: Diagnosis not present

## 2020-05-11 DIAGNOSIS — D123 Benign neoplasm of transverse colon: Secondary | ICD-10-CM | POA: Insufficient documentation

## 2020-05-11 DIAGNOSIS — K635 Polyp of colon: Secondary | ICD-10-CM

## 2020-05-11 DIAGNOSIS — Z79899 Other long term (current) drug therapy: Secondary | ICD-10-CM | POA: Insufficient documentation

## 2020-05-11 DIAGNOSIS — K2950 Unspecified chronic gastritis without bleeding: Secondary | ICD-10-CM | POA: Diagnosis not present

## 2020-05-11 DIAGNOSIS — K640 First degree hemorrhoids: Secondary | ICD-10-CM | POA: Insufficient documentation

## 2020-05-11 DIAGNOSIS — K3189 Other diseases of stomach and duodenum: Secondary | ICD-10-CM | POA: Diagnosis not present

## 2020-05-11 DIAGNOSIS — R195 Other fecal abnormalities: Secondary | ICD-10-CM | POA: Diagnosis not present

## 2020-05-11 HISTORY — PX: ESOPHAGOGASTRODUODENOSCOPY: SHX5428

## 2020-05-11 HISTORY — PX: BIOPSY: SHX5522

## 2020-05-11 HISTORY — PX: COLONOSCOPY: SHX5424

## 2020-05-11 HISTORY — PX: POLYPECTOMY: SHX5525

## 2020-05-11 LAB — GLUCOSE, CAPILLARY: Glucose-Capillary: 149 mg/dL — ABNORMAL HIGH (ref 70–99)

## 2020-05-11 SURGERY — COLONOSCOPY
Anesthesia: Moderate Sedation

## 2020-05-11 MED ORDER — ONDANSETRON HCL 4 MG/2ML IJ SOLN
INTRAMUSCULAR | Status: AC
  Filled 2020-05-11: qty 2

## 2020-05-11 MED ORDER — MIDAZOLAM HCL 5 MG/5ML IJ SOLN
INTRAMUSCULAR | Status: DC | PRN
  Administered 2020-05-11 (×5): 1 mg via INTRAVENOUS

## 2020-05-11 MED ORDER — LIDOCAINE VISCOUS HCL 2 % MT SOLN
OROMUCOSAL | Status: DC | PRN
  Administered 2020-05-11: 1 via OROMUCOSAL

## 2020-05-11 MED ORDER — STERILE WATER FOR IRRIGATION IR SOLN
Status: DC | PRN
  Administered 2020-05-11: 200 mL

## 2020-05-11 MED ORDER — SODIUM CHLORIDE 0.9 % IV SOLN: INTRAVENOUS | Status: DC

## 2020-05-11 MED ORDER — ONDANSETRON HCL 4 MG/2ML IJ SOLN
INTRAMUSCULAR | Status: DC | PRN
  Administered 2020-05-11: 4 mg via INTRAVENOUS

## 2020-05-11 MED ORDER — MEPERIDINE HCL 100 MG/ML IJ SOLN
INTRAMUSCULAR | Status: DC | PRN
  Administered 2020-05-11: 25 mg via INTRAVENOUS
  Administered 2020-05-11: 10 mg via INTRAVENOUS
  Administered 2020-05-11: 15 mg via INTRAVENOUS

## 2020-05-11 MED ORDER — LIDOCAINE VISCOUS HCL 2 % MT SOLN
OROMUCOSAL | Status: AC
  Filled 2020-05-11: qty 15

## 2020-05-11 MED ORDER — MIDAZOLAM HCL 5 MG/5ML IJ SOLN
INTRAMUSCULAR | Status: AC
  Filled 2020-05-11: qty 10

## 2020-05-11 MED ORDER — MEPERIDINE HCL 50 MG/ML IJ SOLN
INTRAMUSCULAR | Status: AC
  Filled 2020-05-11: qty 1

## 2020-05-11 NOTE — Discharge Instructions (Signed)
Colonoscopy Discharge Instructions  Read the instructions outlined below and refer to this sheet in the next few weeks. These discharge instructions provide you with general information on caring for yourself after you leave the hospital. Your doctor may also give you specific instructions. While your treatment has been planned according to the most current medical practices available, unavoidable complications occasionally occur. If you have any problems or questions after discharge, call Dr. Gala Romney at [phone]. ACTIVITY  You may resume your regular activity, but move at a slower pace for the next 24 hours.   Take frequent rest periods for the next 24 hours.   Walking will help get rid of the air and reduce the bloated feeling in your belly (abdomen).   No driving for 24 hours (because of the medicine (anesthesia) used during the test).    Do not sign any important legal documents or operate any machinery for 24 hours (because of the anesthesia used during the test).  NUTRITION  Drink plenty of fluids.   You may resume your normal diet as instructed by your doctor.   Begin with a light meal and progress to your normal diet. Heavy or fried foods are harder to digest and may make you feel sick to your stomach (nauseated).   Avoid alcoholic beverages for 24 hours or as instructed.  MEDICATIONS  You may resume your normal medications unless your doctor tells you otherwise.  WHAT YOU CAN EXPECT TODAY  Some feelings of bloating in the abdomen.   Passage of more gas than usual.   Spotting of blood in your stool or on the toilet paper.  IF YOU HAD POLYPS REMOVED DURING THE COLONOSCOPY:  No aspirin products for 7 days or as instructed.   No alcohol for 7 days or as instructed.   Eat a soft diet for the next 24 hours.  FINDING OUT THE RESULTS OF YOUR TEST Not all test results are available during your visit. If your test results are not back during the visit, make an appointment  with your caregiver to find out the results. Do not assume everything is normal if you have not heard from your caregiver or the medical facility. It is important for you to follow up on all of your test results.  SEEK IMMEDIATE MEDICAL ATTENTION IF:  You have more than a spotting of blood in your stool.   Your belly is swollen (abdominal distention).   You are nauseated or vomiting.   You have a temperature over 101.   You have abdominal pain or discomfort that is severe or gets worse throughout the day.   EGD Discharge instructions Please read the instructions outlined below and refer to this sheet in the next few weeks. These discharge instructions provide you with general information on caring for yourself after you leave the hospital. Your doctor may also give you specific instructions. While your treatment has been planned according to the most current medical practices available, unavoidable complications occasionally occur. If you have any problems or questions after discharge, please call your doctor. ACTIVITY  You may resume your regular activity but move at a slower pace for the next 24 hours.   Take frequent rest periods for the next 24 hours.   Walking will help expel (get rid of) the air and reduce the bloated feeling in your abdomen.   No driving for 24 hours (because of the anesthesia (medicine) used during the test).   You may shower.   Do not sign any  important legal documents or operate any machinery for 24 hours (because of the anesthesia used during the test).  NUTRITION  Drink plenty of fluids.   You may resume your normal diet.   Begin with a light meal and progress to your normal diet.   Avoid alcoholic beverages for 24 hours or as instructed by your caregiver.  MEDICATIONS  You may resume your normal medications unless your caregiver tells you otherwise.  WHAT YOU CAN EXPECT TODAY  You may experience abdominal discomfort such as a feeling of  fullness or "gas" pains.  FOLLOW-UP  Your doctor will discuss the results of your test with you.  SEEK IMMEDIATE MEDICAL ATTENTION IF ANY OF THE FOLLOWING OCCUR:  Excessive nausea (feeling sick to your stomach) and/or vomiting.   Severe abdominal pain and distention (swelling).   Trouble swallowing.   Temperature over 101 F (37.8 C).   Rectal bleeding or vomiting of blood.    1 polyp removed from your colon.  Your colon looked good.  Stomach a little inflamed.  Biopsies taken.  Further recommendations to follow pending review of pathology report  At patient request, I called Olusegun Gerstenberger at [phone]  -left message on answering service.

## 2020-05-11 NOTE — Op Note (Signed)
[facility] Patient Name: Richard Alvarado Procedure Date: 05/11/2020 7:56 AM MRN: [id number] Date of Birth: 22-Mar-1941 Attending MD: Norvel Richards , MD CSN: [id number] Age: 79 Admit Type: Outpatient Procedure:                Colonoscopy Indications:              Heme positive stool Providers:                Norvel Richards, MD, Tammy Vaught, RN, Lambert Mody, Aram Candela Referring MD:              Medicines:                Midazolam 4 mg IV, Meperidine 40 mg IV Complications:            No immediate complications. Estimated Blood Loss:     Estimated blood loss was minimal. Procedure:                Pre-Anesthesia Assessment:                           - Prior to the procedure, a History and Physical                            was performed, and patient medications and                            allergies were reviewed. The patient's tolerance of                            previous anesthesia was also reviewed. The risks                            and benefits of the procedure and the sedation                            options and risks were discussed with the patient.                            All questions were answered, and informed consent                            was obtained. Prior Anticoagulants: The patient has                            taken no previous anticoagulant or antiplatelet                            agents. ASA Grade Assessment: III - A patient with                            severe systemic disease. After reviewing the risks  and benefits, the patient was deemed in                            satisfactory condition to undergo the procedure.                           After obtaining informed consent, the colonoscope                            was passed under direct vision. Throughout the                            procedure, the patient's blood pressure, pulse, and                             oxygen saturations were monitored continuously. The                            [id number]-[id number] ([id number]) scope was introduced through                            the anus and advanced to the the cecum, identified                            by appendiceal orifice and ileocecal valve. The                            colonoscopy was performed without difficulty. The                            patient tolerated the procedure well. The quality                            of the bowel preparation was adequate. The                            ileocecal valve, appendiceal orifice, and rectum                            were photographed. The entire colon was well                            visualized. Scope In: 8:01:01 AM Scope Out: 8:22:47 AM Scope Withdrawal Time: 0 hours 9 minutes 42 seconds  Total Procedure Duration: 0 hours 21 minutes 46 seconds  Findings:      The perianal and digital rectal examinations were normal. Redundant       colon. External abdominal pressure and changing the patient's position       required to reach the cecum.      A 5 mm polyp was found in the splenic flexure. The polyp was       semi-pedunculated. The polyp was removed with a cold snare. Resection       and retrieval were complete. Estimated blood loss was minimal.      Internal hemorrhoids were found  during retroflexion. The hemorrhoids       were moderate, medium-sized and Grade I (internal hemorrhoids that do       not prolapse).      The exam was otherwise without abnormality on direct and retroflexion       views. Impression:               - One 5 mm polyp at the splenic flexure, removed                            with a cold snare. Resected and retrieved.                           - Internal hemorrhoids. Redundant colon.                           - The examination was otherwise normal on direct                            and retroflexion views. Moderate Sedation:      Moderate (conscious) sedation was  personally administered by an       anesthesia professional. The following parameters were monitored: oxygen       saturation, heart rate, blood pressure, respiratory rate, EKG, adequacy       of pulmonary ventilation, and response to care. Recommendation:           - Patient has a contact number available for                            emergencies. The signs and symptoms of potential                            delayed complications were discussed with the                            patient. Return to normal activities tomorrow.                            Written discharge instructions were provided to the                            patient.                           - Advance diet as tolerated.                           - Continue present medications.                           - Repeat colonoscopy in 5 years for surveillance                            based on pathology results.                           - Return to GI office (  date not yet determined).                            See EGD report. Procedure Code(s):        --- Professional ---                           [phone], Colonoscopy, flexible; with removal of                            tumor(s), polyp(s), or other lesion(s) by snare                            technique Diagnosis Code(s):        --- Professional ---                           K63.5, Polyp of colon                           K64.0, First degree hemorrhoids                           R19.5, Other fecal abnormalities CPT copyright 2019 American Medical Association. All rights reserved. The codes documented in this report are preliminary and upon coder review may  be revised to meet current compliance requirements. Cristopher Estimable. Kinga Cassar, MD Norvel Richards, MD 05/11/2020 8:31:49 AM This report has been signed electronically. Number of Addenda: 0

## 2020-05-11 NOTE — Op Note (Signed)
[facility] Patient Name: Richard Alvarado Procedure Date: 05/11/2020 7:06 AM MRN: [id number] Date of Birth: [birth year] Attending MD: Norvel Richards , MD CSN: [id number] Age: 79 Admit Type: Outpatient Procedure:                Upper GI endoscopy Indications:              Heme positive stool Providers:                Norvel Richards, MD, Tammy Vaught, RN, Lambert Mody, Aram Candela Referring MD:              Medicines:                Midazolam 4 mg IV, Meperidine 40 mg IV Complications:            No immediate complications. Estimated Blood Loss:     Estimated blood loss was minimal. Procedure:                Pre-Anesthesia Assessment:                           - Prior to the procedure, a History and Physical                            was performed, and patient medications and                            allergies were reviewed. The patient's tolerance of                            previous anesthesia was also reviewed. The risks                            and benefits of the procedure and the sedation                            options and risks were discussed with the patient.                            All questions were answered, and informed consent                            was obtained. Prior Anticoagulants: The patient has                            taken no previous anticoagulant or antiplatelet                            agents. ASA Grade Assessment: III - A patient with                            severe systemic disease. After reviewing the risks  and benefits, the patient was deemed in                            satisfactory condition to undergo the procedure.                           After obtaining informed consent, the endoscope was                            passed under direct vision. Throughout the                            procedure, the patient's blood pressure, pulse, and                             oxygen saturations were monitored continuously. The                            [id number]) was introduced through the mouth,                            and advanced to the second part of duodenum. The                            upper GI endoscopy was accomplished without                            difficulty. The patient tolerated the procedure                            well. Scope In: 7:51:12 AM Scope Out: 7:55:22 AM Total Procedure Duration: 0 hours 4 minutes 10 seconds  Findings:      The examined esophagus was normal.      Patchy erythema and erosion of the gastric mucosa diffusely. No ulcer or       infiltrating process. Patent pylorus.      The duodenal bulb and second portion of the duodenum were normal. This       was biopsied with a cold forceps for histology. Estimated blood loss was       minimal. Impression:               - Normal esophagus.                           -Abnormal gastric mucosa. Rule out H. pylori.                            Biopsies taken..                           - Normal duodenal bulb and second portion of the                            duodenum. Moderate Sedation:      Moderate (conscious) sedation was administered by the endoscopy nurse  and supervised by the endoscopist. The following parameters were       monitored: oxygen saturation, heart rate, blood pressure, respiratory       rate, EKG, adequacy of pulmonary ventilation, and response to care.       Total physician intraservice time was 17 minutes. Recommendation:           - Patient has a contact number available for                            emergencies. The signs and symptoms of potential                            delayed complications were discussed with the                            patient. Return to normal activities tomorrow.                            Written discharge instructions were provided to the                            patient.                            - Resume previous diet.                           - Continue present medications.                           - Return to my office (date not yet determined).                            See colonoscopy report. Procedure Code(s):        --- Professional ---                           [phone], Esophagogastroduodenoscopy, flexible,                            transoral; with biopsy, single or multiple                           G0500, Moderate sedation services provided by the                            same physician or other qualified health care                            professional performing a gastrointestinal                            endoscopic service that sedation supports,                            requiring the presence of an independent trained  observer to assist in the monitoring of the                            patient's level of consciousness and physiological                            status; initial 15 minutes of intra-service time;                            patient age 61 years or older (additional time may                            be reported with [phone], as appropriate) Diagnosis Code(s):        --- Professional ---                           K31.89, Other diseases of stomach and duodenum                           R19.5, Other fecal abnormalities CPT copyright 2019 American Medical Association. All rights reserved. The codes documented in this report are preliminary and upon coder review may  be revised to meet current compliance requirements. Richard Alvarado. Richard Groene, MD Norvel Richards, MD 05/11/2020 8:27:54 AM This report has been signed electronically. Number of Addenda: 0

## 2020-05-11 NOTE — H&P (Signed)
@LOGO @   Primary Care Physician:  Celene Squibb, MD Primary Gastroenterologist:  Dr. Gala Romney  Pre-Procedure History & Physical: HPI:  Richard Alvarado is a 79 y.o. male here for both an EGD and colonoscopy to further evaluate Hemoccult positive stool.  Patient otherwise asymptomatic.   History of mild anemia. Past Medical History:  Diagnosis Date  . Arthritis   . Cancer [facility]) 2002   prostate  . Cerebral atherosclerosis    CAROTID DOPPLER, 09/07/2009 - RIGHT AND LEFT CCAs-small-moderate amount of irregular mixed density plaque, no significant evidence of diameter reduction; RIGHT AND LEFT ICAs-moderate amount irregular mixed density plaque, no evidence of significant diameter reduction  . CVA (cerebral vascular accident) ([place]) 1994   Left brain  . Diabetes mellitus without complication ([place])   . History of CVA (cerebrovascular accident) 04/24/2013  . Hypercholesterolemia   . Hypertension    RENAL DOPPLER, 03/08/2009 - normal  . Hypertension in pregnancy, preeclampsia, severe 04/24/2013  . Obstructive sleep apnea 09/26/2005   AHI-19.46, during REM-36.88  . Pacemaker St. Jude victory, dual chamber, 2006 04/24/2013  . Pericardial effusion    2D ECHO, 03/07/2011 - EF >70%, normal  . Prostate cancer ([place])   . Second degree heart block 04/24/2013    Past Surgical History:  Procedure Laterality Date  . EP IMPLANTABLE DEVICE N/A 11/21/2014   Procedure: PPM/BIV PPM Generator Changeout;  Surgeon: [name], MD;  Location: [place] [facility];  Service: Cardiovascular;  Laterality: N/A;  . EYE SURGERY    . NM MYOVIEW LTD  03/31/2012   Normal study, no ECG changes, EKG negative for ischemia, post-stress EF 54%  . PACEMAKER INSERTION  05/09/2005   St. Jude Gann, [place] #[id number], serial [id number], replaced 2016  . PROSTATE SURGERY  2002   removed- cancer    Prior to Admission medications   Medication Sig Start Date End Date Taking? Authorizing Provider  acetaminophen (TYLENOL) 500 MG  tablet Take 1,000 mg by mouth every 6 (six) hours as needed (for pain.).   Yes [provider]  amLODipine (NORVASC) 10 MG tablet TAKE 1 TABLET BY MOUTH EVERY DAY Patient taking differently: Take 10 mg by mouth daily.  11/18/19  Yes Croitoru, Mihai, MD  atorvastatin (LIPITOR) 80 MG tablet Take 80 mg by mouth at bedtime.   Yes [provider]  chlorthalidone (HYGROTON) 25 MG tablet TAKE 1/2 TABLET BY MOUTH EVERY DAY 05/08/20  Yes Croitoru, Mihai, MD  cloNIDine (CATAPRES) 0.3 MG tablet Take 1 tablet (0.3 mg total) by mouth 3 (three) times daily. 07/21/19  Yes Croitoru, Mihai, MD  doxazosin (CARDURA) 2 MG tablet TAKE 2 TABLETS FOR 5 DAYS AND THEN RESUME 1 DAILY 05/07/20  Yes Skeet Latch, MD  glipiZIDE (GLUCOTROL) 5 MG tablet Take 1 tablet (5 mg total) by mouth 2 (two) times daily before a meal. 04/03/16  Yes Love, Ivan Anchors, PA-C  metFORMIN (GLUCOPHAGE) 1000 MG tablet Take 1 tablet (1,000 mg total) by mouth 2 (two) times daily with a meal. 04/03/16  Yes Love, Ivan Anchors, PA-C  pantoprazole (PROTONIX) 40 MG tablet Take 1 tablet (40 mg total) by mouth daily. 04/04/16  Yes Love, Ivan Anchors, PA-C  valsartan (DIOVAN) 320 MG tablet Take 1 tablet (320 mg total) by mouth daily. 04/02/20  Yes Skeet Latch, MD    Allergies as of 04/03/2020 - Review Complete 04/03/2020  Allergen Reaction Noted  . Minoxidil  03/01/2020    Family History  Problem Relation Age of Onset  . CVA Mother   .  Stroke Mother   . Heart attack Father   . Stroke Father   . Hypertension Sister   . Diabetes Sister   . Heart disease Maternal Grandmother   . Colon cancer Neg Hx   . Gastric cancer Neg Hx   . Esophageal cancer Neg Hx     Social History   Socioeconomic History  . Marital status: Married    Spouse name: [name]  . Number of children: 1  . Years of education: 61  . Highest education level: Not on file  Occupational History  . Occupation: reitred    Comment: Interior and spatial designer, retired Garment/textile technologist   Tobacco Use  . Smoking status: Former Smoker    Quit date: 06/09/1999    Years since quitting: 20.9  . Smokeless tobacco: Never Used  Substance and Sexual Activity  . Alcohol use: No  . Drug use: No  . Sexual activity: Not Currently  Other Topics Concern  . Not on file  Social History Narrative   Lives with wife   Caffeine free coffee only   Social Determinants of Health   Financial Resource Strain:   . Difficulty of Paying Living Expenses: Not on file  Food Insecurity:   . Worried About Charity fundraiser in the Last Year: Not on file  . Ran Out of Food in the Last Year: Not on file  Transportation Needs:   . Lack of Transportation (Medical): Not on file  . Lack of Transportation (Non-Medical): Not on file  Physical Activity:   . Days of Exercise per Week: Not on file  . Minutes of Exercise per Session: Not on file  Stress:   . Feeling of Stress : Not on file  Social Connections:   . Frequency of Communication with Friends and Family: Not on file  . Frequency of Social Gatherings with Friends and Family: Not on file  . Attends Religious Services: Not on file  . Active Member of Clubs or Organizations: Not on file  . Attends Archivist Meetings: Not on file  . Marital Status: Not on file  Intimate Partner Violence:   . Fear of Current or Ex-Partner: Not on file  . Emotionally Abused: Not on file  . Physically Abused: Not on file  . Sexually Abused: Not on file    Review of Systems: See HPI, otherwise negative ROS  Physical Exam: BP 137/83   Temp 98.2 F (36.8 C) (Oral)   Resp 18   Ht 5\' 9"  (1.753 m)   Wt 89.8 kg   SpO2 97%   BMI 29.24 kg/m  General:   Alert,  Well-developed, well-nourished, pleasant and cooperative in NAD Neck:  Supple; no masses or thyromegaly. No significant cervical adenopathy. Lungs:  Clear throughout to auscultation.   No wheezes, crackles, or rhonchi. No acute distress. Heart:  Regular rate and rhythm; no murmurs, clicks,  rubs,  or gallops. Abdomen: Non-distended, normal bowel sounds.  Soft and nontender without appreciable mass or hepatosplenomegaly.  Pulses:  Normal pulses noted. Extremities:  Without clubbing or edema.  Impression/Plan: 79 year old gentleman here for evaluation of Hemoccult positive stool no prior EGD or colonoscopy. I will offer the patient EGD and colonoscopy to further evaluate. The risks, benefits, limitations, imponderables and alternatives regarding both EGD and colonoscopy have been reviewed with the patient. Questions have been answered. All parties agreeable.     Notice: This dictation was prepared with Dragon dictation along with smaller phrase technology. Any transcriptional errors that result from this process  are unintentional and may not be corrected upon review.

## 2020-05-14 ENCOUNTER — Other Ambulatory Visit: Payer: Self-pay

## 2020-05-14 LAB — SURGICAL PATHOLOGY

## 2020-05-15 ENCOUNTER — Encounter: Payer: Self-pay | Admitting: Internal Medicine

## 2020-05-16 ENCOUNTER — Encounter (HOSPITAL_COMMUNITY): Payer: Self-pay | Admitting: Internal Medicine

## 2020-05-31 ENCOUNTER — Telehealth (INDEPENDENT_AMBULATORY_CARE_PROVIDER_SITE_OTHER): Payer: Medicare Other | Admitting: Cardiovascular Disease

## 2020-05-31 ENCOUNTER — Encounter: Payer: Self-pay | Admitting: Cardiovascular Disease

## 2020-05-31 VITALS — BP 131/73 | HR 60 | Wt 198.0 lb

## 2020-05-31 DIAGNOSIS — G4733 Obstructive sleep apnea (adult) (pediatric): Secondary | ICD-10-CM

## 2020-05-31 DIAGNOSIS — I313 Pericardial effusion (noninflammatory): Secondary | ICD-10-CM

## 2020-05-31 DIAGNOSIS — I471 Supraventricular tachycardia: Secondary | ICD-10-CM

## 2020-05-31 DIAGNOSIS — I4719 Other supraventricular tachycardia: Secondary | ICD-10-CM

## 2020-05-31 DIAGNOSIS — I3139 Other pericardial effusion (noninflammatory): Secondary | ICD-10-CM

## 2020-05-31 DIAGNOSIS — I10 Essential (primary) hypertension: Secondary | ICD-10-CM | POA: Diagnosis not present

## 2020-05-31 NOTE — Progress Notes (Signed)
Virtual Visit via Telephone Note   This visit type was conducted due to national recommendations for restrictions regarding the COVID-19 Pandemic (e.g. social distancing) in an effort to limit this patient's exposure and mitigate transmission in our community.  Due to his co-morbid illnesses, this patient is at least at moderate risk for complications without adequate follow up.  This format is felt to be most appropriate for this patient at this time.  The patient did not have access to video technology/had technical difficulties with video requiring transitioning to audio format only (telephone).  All issues noted in this document were discussed and addressed.  No physical exam could be performed with this format.  Please refer to the patient's chart for his  consent to telehealth for [facility].   The patient was identified using 2 identifiers.  Date:  05/31/2020   ID:  Richard Alvarado, DOB March 05, 1941, MRN [id number]  Patient Location: Home Provider Location: Office/Clinic  PCP:  Richard Squibb, MD  Cardiologist:  Richard Klein, MD  Electrophysiologist:  None   Evaluation Performed:  Follow-Up Visit  Chief Complaint:  hypertension  History of Present Illness:    Richard Alvarado is a 79 y.o. male with a hx of diabetes, hypertension, hyperlipidemia, prior stroke, and second-degree AV block status post pacemaker here for follow up.  He was initially seen 02/2020 to establish care in the hypertension clinic. Lately he has struggled with his blood pressure.  He saw Richard Alvarado on 01/2020.  Prior to that he saw his PCP and hydralazine was increased to 25 mg 4 times daily.  He complained of orthostatic dizziness, fatigue, and dry mouth.  In the office with Dr. Lurline Alvarado blood pressure was 150/85 and improved to 135/90 on repeat.  In the past his blood pressure was well-controlled on minoxidil, but this was discontinued due to a pericardial effusion.  Richard Alvarado added chlorthalidone and he was referred to  the advanced hypertension clinic.  At his initial appointment it was noted that he was taking losartan 100 mg twice daily.  This was reduced to once daily due to worsening renal function.  At his initial advanced hypertension clinic visit his blood pressure was actually well-controlled.  It was noted that he was drinking several sodas daily and that was recommended that he reduce this intake.  He was also referred for renal artery Dopplers that demonstrated normal blood flow bilaterally.  We asked him to increase his exercise to 150 minutes weekly.    Richard Alvarado blood pressure remained poorly controlled.  Losartan was switched to valsartan.  His blood pressure continued to remain high so doxazosin was called into the pharmacy.  He had a virtual follow-up with our APP 11/22 and his blood pressure was better controlled.  He went for colonoscopy 05/2020.  Prior to surgery his BP got very high whenever he was anxious.  Doxazosin was temporarily increased to 4mg .  His PCP also started him on Lexapro with 7 days of Xanax around the procedure.  He had his colonoscopy and had a 5 mm polyp at the splenic flexure.  He has been well since that time and has no complaints today.  His BP is higher in the AM.  100s/50-60 in the morning.  130s/80 in the evening.  He has no lightheadedness or dizziness.  He hasn't been exercising as much lately.  He plans to start back walking with his wife.  Previous antihypertensives: Minoxidil-pericardial effusion   Past Medical History:  Diagnosis  Date  . Arthritis   . Cancer [facility]) 2002   prostate  . Cerebral atherosclerosis    CAROTID DOPPLER, 09/07/2009 - RIGHT AND LEFT CCAs-small-moderate amount of irregular mixed density plaque, no significant evidence of diameter reduction; RIGHT AND LEFT ICAs-moderate amount irregular mixed density plaque, no evidence of significant diameter reduction  . CVA (cerebral vascular accident) ([place]) 1994   Left brain  . Diabetes mellitus  without complication ([place])   . History of CVA (cerebrovascular accident) 04/24/2013  . Hypercholesterolemia   . Hypertension    RENAL DOPPLER, 03/08/2009 - normal  . Hypertension in pregnancy, preeclampsia, severe 04/24/2013  . Obstructive sleep apnea 09/26/2005   AHI-19.46, during REM-36.88  . Pacemaker St. Jude victory, dual chamber, 2006 04/24/2013  . Pericardial effusion    2D ECHO, 03/07/2011 - EF >70%, normal  . Prostate cancer ([place])   . Second degree heart block 04/24/2013    Past Surgical History:  Procedure Laterality Date  . BIOPSY  05/11/2020   Procedure: BIOPSY;  Surgeon: Richard Dolin, MD;  [facility];  Service: Endoscopy;;  . COLONOSCOPY N/A 05/11/2020   Procedure: COLONOSCOPY;  Surgeon: Richard Dolin, MD;  Location[facility];  Service: Endoscopy;  Laterality: N/A;  7:30am  . EP IMPLANTABLE DEVICE N/A 11/21/2014   Procedure: PPM/BIV PPM Generator Changeout;  Surgeon: Richard Klein, MD;  Location: [place] CV LAB;  Service: Cardiovascular;  Laterality: N/A;  . ESOPHAGOGASTRODUODENOSCOPY N/A 05/11/2020   Procedure: ESOPHAGOGASTRODUODENOSCOPY (EGD);  Surgeon: Richard Dolin, MD;  Location[facility];  Service: Endoscopy;  Laterality: N/A;  . EYE SURGERY    . NM MYOVIEW LTD  03/31/2012   Normal study, no ECG changes, EKG negative for ischemia, post-stress EF 54%  . PACEMAKER INSERTION  05/09/2005   St. Jude [place], [place] #[id number], serial [id number], replaced 2016  . POLYPECTOMY  05/11/2020   Procedure: POLYPECTOMY;  Surgeon: Richard Dolin, MD;  [facility];  Service: Endoscopy;;  . PROSTATE SURGERY  2002   removed- cancer    Current Medications: Current Meds  Medication Sig  . acetaminophen (TYLENOL) 500 MG tablet Take 1,000 mg by mouth every 6 (six) hours as needed (for pain.).  Marland Kitchen amLODipine (NORVASC) 10 MG tablet Take 10 mg by mouth daily.  Marland Kitchen atorvastatin (LIPITOR) 80 MG tablet Take 80 mg by mouth at bedtime.  . chlorthalidone  (HYGROTON) 25 MG tablet TAKE 1/2 TABLET BY MOUTH EVERY DAY  . cloNIDine (CATAPRES) 0.3 MG tablet Take 1 tablet (0.3 mg total) by mouth 3 (three) times daily.  Marland Kitchen doxazosin (CARDURA) 2 MG tablet TAKE 2 TABLETS FOR 5 DAYS AND THEN RESUME 1 DAILY  . glipiZIDE (GLUCOTROL) 5 MG tablet Take 1 tablet (5 mg total) by mouth 2 (two) times daily before a meal.  . metFORMIN (GLUCOPHAGE) 1000 MG tablet Take 1 tablet (1,000 mg total) by mouth 2 (two) times daily with a meal.  . pantoprazole (PROTONIX) 40 MG tablet Take 1 tablet (40 mg total) by mouth daily.  . valsartan (DIOVAN) 320 MG tablet Take 1 tablet (320 mg total) by mouth daily.  . [DISCONTINUED] amLODipine (NORVASC) 10 MG tablet TAKE 1 TABLET BY MOUTH EVERY DAY (Patient taking differently: Take 10 mg by mouth daily.)     Allergies:   Minoxidil   Social History   Socioeconomic History  . Marital status: Married    Spouse name: [name]  . Number of children: 1  . Years of education: 4  .  Highest education level: Not on file  Occupational History  . Occupation: reitred    Comment: Interior and spatial designer, retired Garment/textile technologist  Tobacco Use  . Smoking status: Former Smoker    Quit date: 06/09/1999    Years since quitting: 20.9  . Smokeless tobacco: Never Used  Substance and Sexual Activity  . Alcohol use: No  . Drug use: No  . Sexual activity: Not Currently  Other Topics Concern  . Not on file  Social History Narrative   Lives with wife   Caffeine free coffee only   Social Determinants of Health   Financial Resource Strain: Not on file  Food Insecurity: Not on file  Transportation Needs: Not on file  Physical Activity: Not on file  Stress: Not on file  Social Connections: Not on file     Family History: The patient's family history includes CVA in his mother; Diabetes in his sister; Heart attack in his father; Heart disease in his maternal grandmother; Hypertension in his sister; Stroke in his father and mother. There is no history of Colon  cancer, Gastric cancer, or Esophageal cancer.  ROS:   Please see the history of present illness.    All other systems reviewed and are negative.  EKGs/Labs/Other Studies Reviewed:    EKG:  EKG is not ordered today.    Recent Labs: 03/01/2020: BUN 17; Creatinine, Ser 1.43; Potassium 3.6; Sodium 138; TSH 1.870   Recent Lipid Panel    Component Value Date/Time   CHOL 136 12/28/2018 0000   TRIG 136 12/28/2018 0000   HDL 33 (A) 12/28/2018 0000   CHOLHDL 3.3 03/22/2016 0528   VLDL 9 03/22/2016 0528   LDLCALC 80 12/28/2018 0000    Physical Exam:    BP 131/73   Pulse 60   Wt 198 lb (89.8 kg)   BMI 29.24 kg/m  GENERAL: Sounds well.  No acute distress. RESP: No respiratory distress NEURO:  Speech fluent.   PSYCH:  Cognitively intact, oriented to person place and time  ASSESSMENT:    1. Essential hypertension   2. Pericardial effusion secondary to minoxidil   3. PAT (paroxysmal atrial tachycardia) ([place])   4. OSA (obstructive sleep apnea)     PLAN:    # Essential hypertension:  Blood pressure has been much better controlled.  It gets into the 130s in the evening but has been low in the AM.  Overall much more stable.  Continue current regimen of amlodipine, chlorthalidone, clonidine doxazosin, and valsartan.  Encouraged him to start back exercising regularly.  He continues to limit his sodium intake.  Secondary Causes of Hypertension  Medications/Herbal: OCP, steroids, stimulants, antidepressants, weight loss medication, immune suppressants, NSAIDs, sympathomimetics, alcohol, caffeine, licorice, ginseng, St. John's wort, chemo: will work on limiting Dr. Malachi Bonds Sleep Apnea: Not using CPAP regulaly Renal artery stenosis: Negative renal artery Dopplers 03/2020 Hyperaldosteronism: Defer given that he is on ARB today and doesn't like to drive Hyper/hypothyroidism: TSH normal 02/2020 Pheochromocytoma: (testing not indicated)  Cushing's syndrome:(testing not indicated)   Coarctation of the aorta: (testing not indicated)   Disposition:    FU with MD/PharmD in 6 months.     COVID-19 Education: The signs and symptoms of COVID-19 were discussed with the patient and how to seek care for testing (follow up with PCP or arrange E-visit).  The importance of social distancing was discussed today.  Time:   Today, I have spent 16 minutes with the patient with telehealth technology discussing the above problems.    Medication  Adjustments/Labs and Tests Ordered: Current medicines are reviewed at length with the patient today.  Concerns regarding medicines are outlined above.   No orders of the defined types were placed in this encounter.  No orders of the defined types were placed in this encounter.    Signed, Skeet Latch, MD  05/31/2020 10:25 AM    [place]

## 2020-05-31 NOTE — Patient Instructions (Signed)
Medication Instructions:  Your Physician recommend you continue on your current medication as directed.    *If you need a refill on your cardiac medications before your next appointment, please call your pharmacy*   Lab Work: None  Testing/Procedures: None   Follow-Up: At [facility], you and your health needs are our priority.  As part of our continuing mission to provide you with exceptional heart care, we have created designated Provider Care Teams.  These Care Teams include your primary Cardiologist (physician) and Advanced Practice Providers (APPs -  Physician Assistants and Nurse Practitioners) who all work together to provide you with the care you need, when you need it.  We recommend signing up for the patient portal called "MyChart".  Sign up information is provided on this After Visit Summary.  MyChart is used to connect with patients for Virtual Visits (Telemedicine).  Patients are able to view lab/test results, encounter notes, upcoming appointments, etc.  Non-urgent messages can be sent to your provider as well.   To learn more about what you can do with MyChart, go to [email or web address].    Your next appointment:   6 month(s)  The format for your next appointment:   In Person  Provider:   Tiffany [place], MD    

## 2020-06-08 DIAGNOSIS — E1165 Type 2 diabetes mellitus with hyperglycemia: Secondary | ICD-10-CM | POA: Diagnosis not present

## 2020-06-08 DIAGNOSIS — I10 Essential (primary) hypertension: Secondary | ICD-10-CM | POA: Diagnosis not present

## 2020-06-08 DIAGNOSIS — K219 Gastro-esophageal reflux disease without esophagitis: Secondary | ICD-10-CM | POA: Diagnosis not present

## 2020-06-08 DIAGNOSIS — D649 Anemia, unspecified: Secondary | ICD-10-CM | POA: Diagnosis not present

## 2020-06-08 DIAGNOSIS — E785 Hyperlipidemia, unspecified: Secondary | ICD-10-CM | POA: Diagnosis not present

## 2020-06-08 DIAGNOSIS — K5900 Constipation, unspecified: Secondary | ICD-10-CM | POA: Diagnosis not present

## 2020-06-08 DIAGNOSIS — G4733 Obstructive sleep apnea (adult) (pediatric): Secondary | ICD-10-CM | POA: Diagnosis not present

## 2020-06-08 DIAGNOSIS — R718 Other abnormality of red blood cells: Secondary | ICD-10-CM | POA: Diagnosis not present

## 2020-06-08 DIAGNOSIS — Z8546 Personal history of malignant neoplasm of prostate: Secondary | ICD-10-CM | POA: Diagnosis not present

## 2020-07-02 ENCOUNTER — Other Ambulatory Visit: Payer: Self-pay | Admitting: Cardiovascular Disease

## 2020-07-03 ENCOUNTER — Ambulatory Visit: Payer: Medicare Other | Admitting: Nurse Practitioner

## 2020-07-06 ENCOUNTER — Ambulatory Visit (INDEPENDENT_AMBULATORY_CARE_PROVIDER_SITE_OTHER): Payer: Medicare Other

## 2020-07-06 DIAGNOSIS — I441 Atrioventricular block, second degree: Secondary | ICD-10-CM

## 2020-07-06 LAB — CUP PACEART REMOTE DEVICE CHECK
Battery Remaining Longevity: 113 mo
Battery Remaining Percentage: 95.5 %
Battery Voltage: 2.98 V
Brady Statistic AP VP Percent: 42 %
Brady Statistic AP VS Percent: 6.8 %
Brady Statistic AS VP Percent: 23 %
Brady Statistic AS VS Percent: 28 %
Brady Statistic RA Percent Paced: 48 %
Brady Statistic RV Percent Paced: 65 %
Date Time Interrogation Session: 20220128020014
Implantable Lead Implant Date: 20061201
Implantable Lead Implant Date: 20061201
Implantable Lead Location: 753859
Implantable Lead Location: 753860
Implantable Pulse Generator Implant Date: 20160614
Lead Channel Impedance Value: 460 Ohm
Lead Channel Impedance Value: 560 Ohm
Lead Channel Pacing Threshold Amplitude: 0.375 V
Lead Channel Pacing Threshold Amplitude: 0.75 V
Lead Channel Pacing Threshold Pulse Width: 0.5 ms
Lead Channel Pacing Threshold Pulse Width: 0.5 ms
Lead Channel Sensing Intrinsic Amplitude: 1 mV
Lead Channel Sensing Intrinsic Amplitude: 10.3 mV
Lead Channel Setting Pacing Amplitude: 2 V
Lead Channel Setting Pacing Amplitude: 2 V
Lead Channel Setting Pacing Pulse Width: 0.5 ms
Lead Channel Setting Sensing Sensitivity: 2 mV
Pulse Gen Model: 2240
Pulse Gen Serial Number: 7768207

## 2020-07-07 DIAGNOSIS — K219 Gastro-esophageal reflux disease without esophagitis: Secondary | ICD-10-CM | POA: Diagnosis not present

## 2020-07-07 DIAGNOSIS — Z8546 Personal history of malignant neoplasm of prostate: Secondary | ICD-10-CM | POA: Diagnosis not present

## 2020-07-07 DIAGNOSIS — I10 Essential (primary) hypertension: Secondary | ICD-10-CM | POA: Diagnosis not present

## 2020-07-07 DIAGNOSIS — N1831 Chronic kidney disease, stage 3a: Secondary | ICD-10-CM | POA: Diagnosis not present

## 2020-07-07 DIAGNOSIS — M1712 Unilateral primary osteoarthritis, left knee: Secondary | ICD-10-CM | POA: Diagnosis not present

## 2020-07-07 DIAGNOSIS — E1165 Type 2 diabetes mellitus with hyperglycemia: Secondary | ICD-10-CM | POA: Diagnosis not present

## 2020-07-07 DIAGNOSIS — R718 Other abnormality of red blood cells: Secondary | ICD-10-CM | POA: Diagnosis not present

## 2020-07-07 DIAGNOSIS — E785 Hyperlipidemia, unspecified: Secondary | ICD-10-CM | POA: Diagnosis not present

## 2020-07-07 DIAGNOSIS — G4733 Obstructive sleep apnea (adult) (pediatric): Secondary | ICD-10-CM | POA: Diagnosis not present

## 2020-07-07 DIAGNOSIS — K5900 Constipation, unspecified: Secondary | ICD-10-CM | POA: Diagnosis not present

## 2020-07-07 DIAGNOSIS — D649 Anemia, unspecified: Secondary | ICD-10-CM | POA: Diagnosis not present

## 2020-07-07 DIAGNOSIS — F411 Generalized anxiety disorder: Secondary | ICD-10-CM | POA: Diagnosis not present

## 2020-07-10 ENCOUNTER — Encounter: Payer: Self-pay | Admitting: Nurse Practitioner

## 2020-07-10 ENCOUNTER — Ambulatory Visit (INDEPENDENT_AMBULATORY_CARE_PROVIDER_SITE_OTHER): Payer: Medicare Other | Admitting: Nurse Practitioner

## 2020-07-10 ENCOUNTER — Other Ambulatory Visit: Payer: Self-pay

## 2020-07-10 VITALS — BP 146/74 | HR 73 | Temp 96.8°F | Ht 69.0 in | Wt 191.2 lb

## 2020-07-10 DIAGNOSIS — K648 Other hemorrhoids: Secondary | ICD-10-CM

## 2020-07-10 DIAGNOSIS — K2990 Gastroduodenitis, unspecified, without bleeding: Secondary | ICD-10-CM

## 2020-07-10 DIAGNOSIS — K2970 Gastritis, unspecified, without bleeding: Secondary | ICD-10-CM | POA: Diagnosis not present

## 2020-07-10 DIAGNOSIS — R195 Other fecal abnormalities: Secondary | ICD-10-CM | POA: Diagnosis not present

## 2020-07-10 NOTE — Patient Instructions (Signed)
Your health issues we discussed today were:   Blood in your stool: 1. As we discussed, we did not find any significant source of extensive bleeding 2. This is likely a trivial source most likely from your internal hemorrhoids 3. Let us know if you have any hemorrhoid symptoms and we can send in some treatment for you (generally topical creams) 4. You can also try over-the-counter treatments such as Preparation H, if needed  Gastritis (inflammation of the stomach): 1. As we reviewed, you did have some mild inflammation of your stomach 2. This is often from excessive acid 3. Continue taking Protonix as you have been which will help heal the lining of your stomach and prevent further issues  Overall I recommend:  1. Continue other current medications 2. Return for follow-up as needed for any GI symptoms 3. Call us for any questions or concerns   ---------------------------------------------------------------  I am glad you have gotten your COVID-19 vaccination!  Even though you are fully vaccinated you should continue to follow CDC and state/local guidelines.  ---------------------------------------------------------------   At [facility] we value your feedback. You may receive a survey about your visit today. Please share your experience as we strive to create trusting relationships with our patients to provide genuine, compassionate, quality care.  We appreciate your understanding and patience as we review any laboratory studies, imaging, and other diagnostic tests that are ordered as we care for you. Our office policy is 5 business days for review of these results, and any emergent or urgent results are addressed in a timely manner for your best interest. If you do not hear from our office in 1 week, please contact us.   We also encourage the use of MyChart, which contains your medical information for your review as well. If you are not enrolled in this feature, an  access code is on this after visit summary for your convenience. Thank you for allowing [place] to be involved in your care.  It was great to see you today!  I hope you have a safe and warm winter!!

## 2020-07-10 NOTE — Progress Notes (Signed)
Referring Provider: Celene Squibb, MD Primary Care Physician:  Celene Squibb, MD Primary GI:  Dr. Gala Romney  Chief Complaint  Patient presents with  . heme + stool    F/u. Denies at thist ime    HPI:   Richard Alvarado is a 80 y.o. male who presents for follow-up.  The patient was last seen in our office 04/03/2020 for heme positive stool.  Noted history of GERD well managed on pantoprazole, constipation well managed on senna.  Mildly low hemoglobin of 11.4 in 2021.  Noted microcytic and hyperchromic but iron was normal at 55 and ferritin normal at 309.  At his last visit he was doing well, does not think he is ever had a colonoscopy.  Notes no obvious hematochezia but stool cards positive x3.  No other overt GI complaints.  Blood pressure elevated but is BP meds have been adjusted recently new trying to get his levels better controlled.  GERD still doing well on Protonix.  Recommended colonoscopy and endoscopy for anemia, monitor for obvious bleeding and notify us of any, follow-up in 3 months.  EGD completed 05/11/2020 which found normal esophagus, abnormal gastric mucosa status post biopsy to rule out H. pylori, normal duodenum.  Surgical pathology found the biopsies to be mild chronic gastritis negative for H. pylori. Colonoscopy completed the same day found a single 5 mm polyp at splenic flexure, internal hemorrhoids, otherwise normal.  Surgical pathology found the polyp to be tubular adenoma and recommended no further colonoscopy or EGD unless new symptoms develop.  Today he states he doing okay overall.  He denies seeing any hematochezia or melena recently. Denies abdominal paion, N/V, hematochezia, melena, fever, chills, unintentional weight loss. Denies URI or flu-like symptoms. Denies loss of sense of taste or smell. The patient has received COVID-19 vaccination(s). They have also had a booster. Denies chest pain, dyspnea, dizziness, lightheadedness, syncope, near syncope. Denies any  other upper or lower GI symptoms.  Denies any GERD on Protonix.  Past Medical History:  Diagnosis Date  . Arthritis   . Cancer [facility]) 2002   prostate  . Cerebral atherosclerosis    CAROTID DOPPLER, 09/07/2009 - RIGHT AND LEFT CCAs-small-moderate amount of irregular mixed density plaque, no significant evidence of diameter reduction; RIGHT AND LEFT ICAs-moderate amount irregular mixed density plaque, no evidence of significant diameter reduction  . CVA (cerebral vascular accident) ([place]) 1994   Left brain  . Diabetes mellitus without complication ([place])   . History of CVA (cerebrovascular accident) 04/24/2013  . Hypercholesterolemia   . Hypertension    RENAL DOPPLER, 03/08/2009 - normal  . Hypertension in pregnancy, preeclampsia, severe 04/24/2013  . Obstructive sleep apnea 09/26/2005   AHI-19.46, during REM-36.88  . Pacemaker St. Jude victory, dual chamber, 2006 04/24/2013  . Pericardial effusion    2D ECHO, 03/07/2011 - EF >70%, normal  . Prostate cancer ([place])   . Second degree heart block 04/24/2013    Past Surgical History:  Procedure Laterality Date  . BIOPSY  05/11/2020   Procedure: BIOPSY;  Surgeon: Daneil Dolin, MD;  [facility];  Service: Endoscopy;;  . COLONOSCOPY N/A 05/11/2020   Procedure: COLONOSCOPY;  Surgeon: Daneil Dolin, MD;  [facility];  Service: Endoscopy;  Laterality: N/A;  7:30am  . EP IMPLANTABLE DEVICE N/A 11/21/2014   Procedure: PPM/BIV PPM Generator Changeout;  Surgeon: [name], MD;  Location: [place] CV LAB;  Service: Cardiovascular;  Laterality: N/A;  . ESOPHAGOGASTRODUODENOSCOPY N/A  05/11/2020   Procedure: ESOPHAGOGASTRODUODENOSCOPY (EGD);  Surgeon: Daneil Dolin, MD;  [facility];  Service: Endoscopy;  Laterality: N/A;  . EYE SURGERY    . NM MYOVIEW LTD  03/31/2012   Normal study, no ECG changes, EKG negative for ischemia, post-stress EF 54%  . PACEMAKER INSERTION  05/09/2005   St. Jude [place], [place]  #[id number], serial [id number], replaced 2016  . POLYPECTOMY  05/11/2020   Procedure: POLYPECTOMY;  Surgeon: Daneil Dolin, MD;  [facility];  Service: Endoscopy;;  . PROSTATE SURGERY  2002   removed- cancer    Current Outpatient Medications  Medication Sig Dispense Refill  . acetaminophen (TYLENOL) 500 MG tablet Take 1,000 mg by mouth every 6 (six) hours as needed (for pain.).    Marland Kitchen amLODipine (NORVASC) 10 MG tablet Take 10 mg by mouth daily.    Marland Kitchen atorvastatin (LIPITOR) 80 MG tablet Take 80 mg by mouth at bedtime.    . chlorthalidone (HYGROTON) 25 MG tablet TAKE 1/2 TABLET BY MOUTH EVERY DAY 15 tablet 10  . cloNIDine (CATAPRES) 0.3 MG tablet Take 1 tablet (0.3 mg total) by mouth 3 (three) times daily. 270 tablet 2  . doxazosin (CARDURA) 2 MG tablet TAKE 2 TABLETS FOR 5 DAYS AND THEN RESUME 1 DAILY 35 tablet 11  . escitalopram (LEXAPRO) 5 MG tablet Take 5 mg by mouth daily.    Marland Kitchen glipiZIDE (GLUCOTROL) 5 MG tablet Take 1 tablet (5 mg total) by mouth 2 (two) times daily before a meal. 60 tablet 0  . metFORMIN (GLUCOPHAGE) 1000 MG tablet Take 1 tablet (1,000 mg total) by mouth 2 (two) times daily with a meal. 60 tablet 0  . pantoprazole (PROTONIX) 40 MG tablet Take 1 tablet (40 mg total) by mouth daily. 30 tablet 0  . valsartan (DIOVAN) 320 MG tablet Take 1 tablet (320 mg total) by mouth daily. 90 tablet 3   No current facility-administered medications for this visit.    Allergies as of 07/10/2020 - Review Complete 07/10/2020  Allergen Reaction Noted  . Minoxidil  03/01/2020    Family History  Problem Relation Age of Onset  . CVA Mother   . Stroke Mother   . Heart attack Father   . Stroke Father   . Hypertension Sister   . Diabetes Sister   . Heart disease Maternal Grandmother   . Colon cancer Neg Hx   . Gastric cancer Neg Hx   . Esophageal cancer Neg Hx     Social History   Socioeconomic History  . Marital status: Married    Spouse name: [name]  . Number of children: 1   . Years of education: 64  . Highest education level: Not on file  Occupational History  . Occupation: reitred    Comment: Interior and spatial designer, retired Garment/textile technologist  Tobacco Use  . Smoking status: Former Smoker    Quit date: 06/09/1999    Years since quitting: 21.1  . Smokeless tobacco: Never Used  Substance and Sexual Activity  . Alcohol use: No  . Drug use: No  . Sexual activity: Not Currently  Other Topics Concern  . Not on file  Social History Narrative   Lives with wife   Caffeine free coffee only   Social Determinants of Health   Financial Resource Strain: Not on file  Food Insecurity: Not on file  Transportation Needs: Not on file  Physical Activity: Not on file  Stress: Not on file  Social Connections: Not on file  Subjective: Review of Systems  Constitutional: Negative for chills, fever, malaise/fatigue and weight loss.  HENT: Negative for congestion and sore throat.   Respiratory: Negative for cough and shortness of breath.   Cardiovascular: Negative for chest pain and palpitations.  Gastrointestinal: Negative for abdominal pain, blood in stool, constipation, diarrhea, heartburn, melena, nausea and vomiting.  Musculoskeletal: Negative for joint pain and myalgias.  Skin: Negative for rash.  Neurological: Negative for dizziness and weakness.  Endo/Heme/Allergies: Does not bruise/bleed easily.  Psychiatric/Behavioral: Negative for depression. The patient is not nervous/anxious.   All other systems reviewed and are negative.    Objective: BP (!) 146/74   Pulse 73   Temp (!) 96.8 F (36 C)   Ht 5\' 9"  (1.753 m)   Wt 191 lb 3.2 oz (86.7 kg)   BMI 28.24 kg/m  Physical Exam Vitals and nursing note reviewed.  Constitutional:      General: He is not in acute distress.    Appearance: Normal appearance. He is normal weight. He is not ill-appearing, toxic-appearing or diaphoretic.  HENT:     Head: Normocephalic and atraumatic.     Nose: No congestion or rhinorrhea.   Eyes:     General: No scleral icterus. Cardiovascular:     Rate and Rhythm: Normal rate and regular rhythm.     Heart sounds: Normal heart sounds.  Pulmonary:     Effort: Pulmonary effort is normal.     Breath sounds: Normal breath sounds.  Abdominal:     General: Bowel sounds are normal. There is no distension.     Palpations: Abdomen is soft. There is no hepatomegaly, splenomegaly or mass.     Tenderness: There is no abdominal tenderness. There is no guarding or rebound.     Hernia: No hernia is present.  Musculoskeletal:     Cervical back: Neck supple.  Skin:    General: Skin is warm and dry.     Coloration: Skin is not jaundiced.     Findings: No bruising or rash.  Neurological:     General: No focal deficit present.     Mental Status: He is alert and oriented to person, place, and time. Mental status is at baseline.  Psychiatric:        Mood and Affect: Mood normal.        Behavior: Behavior normal.        Thought Content: Thought content normal.      Assessment:  Very pleasant 80 year old male who presents for follow-up on heme positive stool.  Likely trivial bleeding either from noted internal hemorrhoids versus mild gastritis (H. pylori negative).  He is on a PPI.  No GERD symptoms.  No significant constipation.  I offered hemorrhoid topical management as needed, although he does not need this right now.  We discussed the likely trivial nature of his situation.  No recommended repeat colonoscopies unless new symptoms.   Plan: 1. Continue current medications 2. Return for follow-up as needed for any GI symptoms or hemorrhoid treatments 3. Call us with any worsening problems.    Thank you for allowing [place] to participate in the care of Pollyann Glen, DNP, AGNP-C Adult & Gerontological Nurse Practitioner [facility]   07/10/2020 3:01 PM   Disclaimer: This note was dictated with voice recognition software. Similar  sounding words can inadvertently be transcribed and may not be corrected upon review.

## 2020-07-13 DIAGNOSIS — K219 Gastro-esophageal reflux disease without esophagitis: Secondary | ICD-10-CM | POA: Diagnosis not present

## 2020-07-13 DIAGNOSIS — D649 Anemia, unspecified: Secondary | ICD-10-CM | POA: Diagnosis not present

## 2020-07-13 DIAGNOSIS — M1712 Unilateral primary osteoarthritis, left knee: Secondary | ICD-10-CM | POA: Diagnosis not present

## 2020-07-13 DIAGNOSIS — E1165 Type 2 diabetes mellitus with hyperglycemia: Secondary | ICD-10-CM | POA: Diagnosis not present

## 2020-07-13 DIAGNOSIS — E785 Hyperlipidemia, unspecified: Secondary | ICD-10-CM | POA: Diagnosis not present

## 2020-07-13 DIAGNOSIS — G4733 Obstructive sleep apnea (adult) (pediatric): Secondary | ICD-10-CM | POA: Diagnosis not present

## 2020-07-13 DIAGNOSIS — F411 Generalized anxiety disorder: Secondary | ICD-10-CM | POA: Diagnosis not present

## 2020-07-13 DIAGNOSIS — K5900 Constipation, unspecified: Secondary | ICD-10-CM | POA: Diagnosis not present

## 2020-07-13 DIAGNOSIS — I10 Essential (primary) hypertension: Secondary | ICD-10-CM | POA: Diagnosis not present

## 2020-07-13 DIAGNOSIS — N1831 Chronic kidney disease, stage 3a: Secondary | ICD-10-CM | POA: Diagnosis not present

## 2020-07-13 DIAGNOSIS — R718 Other abnormality of red blood cells: Secondary | ICD-10-CM | POA: Diagnosis not present

## 2020-07-13 DIAGNOSIS — Z8546 Personal history of malignant neoplasm of prostate: Secondary | ICD-10-CM | POA: Diagnosis not present

## 2020-07-14 NOTE — Progress Notes (Signed)
Remote pacemaker transmission.   

## 2020-08-06 ENCOUNTER — Telehealth: Payer: Self-pay | Admitting: Cardiovascular Disease

## 2020-08-06 DIAGNOSIS — R718 Other abnormality of red blood cells: Secondary | ICD-10-CM | POA: Diagnosis not present

## 2020-08-06 DIAGNOSIS — K5900 Constipation, unspecified: Secondary | ICD-10-CM | POA: Diagnosis not present

## 2020-08-06 DIAGNOSIS — N1831 Chronic kidney disease, stage 3a: Secondary | ICD-10-CM | POA: Diagnosis not present

## 2020-08-06 DIAGNOSIS — D649 Anemia, unspecified: Secondary | ICD-10-CM | POA: Diagnosis not present

## 2020-08-06 DIAGNOSIS — E1165 Type 2 diabetes mellitus with hyperglycemia: Secondary | ICD-10-CM | POA: Diagnosis not present

## 2020-08-06 DIAGNOSIS — I10 Essential (primary) hypertension: Secondary | ICD-10-CM | POA: Diagnosis not present

## 2020-08-06 DIAGNOSIS — E785 Hyperlipidemia, unspecified: Secondary | ICD-10-CM | POA: Diagnosis not present

## 2020-08-06 DIAGNOSIS — G4733 Obstructive sleep apnea (adult) (pediatric): Secondary | ICD-10-CM | POA: Diagnosis not present

## 2020-08-06 DIAGNOSIS — K219 Gastro-esophageal reflux disease without esophagitis: Secondary | ICD-10-CM | POA: Diagnosis not present

## 2020-08-06 DIAGNOSIS — F411 Generalized anxiety disorder: Secondary | ICD-10-CM | POA: Diagnosis not present

## 2020-08-06 DIAGNOSIS — M1712 Unilateral primary osteoarthritis, left knee: Secondary | ICD-10-CM | POA: Diagnosis not present

## 2020-08-06 DIAGNOSIS — Z8546 Personal history of malignant neoplasm of prostate: Secondary | ICD-10-CM | POA: Diagnosis not present

## 2020-08-06 NOTE — Telephone Encounter (Signed)
New message:     Pt wants to know if he need his appt on 08-28-20? He have a pacemaker check in May.

## 2020-08-06 NOTE — Telephone Encounter (Signed)
Attempted to call patient, left message for patient to call back to office.   

## 2020-08-07 NOTE — Telephone Encounter (Signed)
Spoke with pt, aware his appointment is just a 6 month cardiology follow up. Patient reports he will be at that appointment.

## 2020-08-28 ENCOUNTER — Ambulatory Visit (INDEPENDENT_AMBULATORY_CARE_PROVIDER_SITE_OTHER): Payer: Medicare Other | Admitting: Cardiovascular Disease

## 2020-08-28 ENCOUNTER — Encounter: Payer: Self-pay | Admitting: Cardiovascular Disease

## 2020-08-28 ENCOUNTER — Other Ambulatory Visit: Payer: Self-pay

## 2020-08-28 VITALS — BP 130/80 | HR 71 | Ht 65.0 in | Wt 194.0 lb

## 2020-08-28 DIAGNOSIS — E785 Hyperlipidemia, unspecified: Secondary | ICD-10-CM

## 2020-08-28 DIAGNOSIS — Z950 Presence of cardiac pacemaker: Secondary | ICD-10-CM

## 2020-08-28 DIAGNOSIS — I10 Essential (primary) hypertension: Secondary | ICD-10-CM

## 2020-08-28 DIAGNOSIS — I441 Atrioventricular block, second degree: Secondary | ICD-10-CM

## 2020-08-28 DIAGNOSIS — E1149 Type 2 diabetes mellitus with other diabetic neurological complication: Secondary | ICD-10-CM | POA: Diagnosis not present

## 2020-08-28 DIAGNOSIS — I471 Supraventricular tachycardia: Secondary | ICD-10-CM | POA: Diagnosis not present

## 2020-08-28 DIAGNOSIS — I672 Cerebral atherosclerosis: Secondary | ICD-10-CM | POA: Diagnosis not present

## 2020-08-28 DIAGNOSIS — I495 Sick sinus syndrome: Secondary | ICD-10-CM

## 2020-08-28 DIAGNOSIS — I69359 Hemiplegia and hemiparesis following cerebral infarction affecting unspecified side: Secondary | ICD-10-CM

## 2020-08-28 NOTE — Progress Notes (Signed)
Cardiology Office Note    Date:  09/03/2020   ID:  Richard Alvarado, DOB [birth year], MRN [id number]  PCP:  Celene Squibb, MD  Cardiologist:   [name], MD   Chief Complaint  Patient presents with  . Irregular Heart Beat  . Pacemaker Check    History of Present Illness:  Richard Alvarado is a 80 y.o. male with second-degree atrioventricular block returning for pacemaker follow-up. He has hypertension, hyperlipidemia, DM and history of remote stroke. He had the pacemaker initially implanted in 2006 and underwent a pacemaker generator change out in June 2016 ([place]).    He is accompanied by his son.  BP control is better. He is generally doing well. The patient specifically denies any chest pain at rest exertion, dyspnea at rest or with exertion, orthopnea, paroxysmal nocturnal dyspnea, syncope, palpitations, focal neurological deficits, intermittent claudication, lower extremity edema, unexplained weight gain, cough, hemoptysis or wheezing.  Most recent labs performed in November 2021 showed stable creatinine of 1.15, hemoglobin 10.6, hemoglobin A1c 6.7%, total cholesterol 112, triglycerides 60, HDL 36, LDL 80.  Pacemaker interrogation shows normal device function with estimated generator longevity of 9.5 years.  He now has 50% atrial pacing.  There is 65% ventricular pacing, a substantial increase, suggesting increased degree of AV block.  He continues to have occasional atrial tachycardia, brief (overall burden <1%), no atrial fibrillation). Activity has decreased over the winter to 1-2 h/day.  He has a long-standing history of very severe and hard to control systemic hypertension. At one point he was taking minoxidil but this was stopped for a pericardial effusion. 20 years ago he had a left brain stroke from which she has recovered without sequelae. In October 2017 he had hemorrhage in the right posterior basal ganglia.  CT of the head shows extensive  intracranial atherosclerosis with dense calcification of the carotid siphons, suspected at least moderate stenosis of the right vertebral artery origin, but no areas of severe stenosis in the circle of Willis.  Pertinent negatives include the absence of coronary insufficiency by nuclear stress testing and the absence of renal artery stenosis by ultrasonography.    Past Medical History:  Diagnosis Date  . Arthritis   . Cancer [facility]) 2002   prostate  . Cerebral atherosclerosis    CAROTID DOPPLER, 09/07/2009 - RIGHT AND LEFT CCAs-small-moderate amount of irregular mixed density plaque, no significant evidence of diameter reduction; RIGHT AND LEFT ICAs-moderate amount irregular mixed density plaque, no evidence of significant diameter reduction  . CVA (cerebral vascular accident) ([place]) 1994   Left brain  . Diabetes mellitus without complication ([place])   . History of CVA (cerebrovascular accident) 04/24/2013  . Hypercholesterolemia   . Hypertension    RENAL DOPPLER, 03/08/2009 - normal  . Hypertension in pregnancy, preeclampsia, severe 04/24/2013  . Obstructive sleep apnea 09/26/2005   AHI-19.46, during REM-36.88  . Pacemaker St. Jude victory, dual chamber, 2006 04/24/2013  . Pericardial effusion    2D ECHO, 03/07/2011 - EF >70%, normal  . Prostate cancer ([place])   . Second degree heart block 04/24/2013    Past Surgical History:  Procedure Laterality Date  . BIOPSY  05/11/2020   Procedure: BIOPSY;  Surgeon: Daneil Dolin, MD;  [facility];  Service: Endoscopy;;  . COLONOSCOPY N/A 05/11/2020   Procedure: COLONOSCOPY;  Surgeon: Daneil Dolin, MD;  [facility];  Service: Endoscopy;  Laterality: N/A;  7:30am  . EP IMPLANTABLE DEVICE N/A 11/21/2014  Procedure: PPM/BIV PPM Generator Changeout;  Surgeon: [name], MD;  Location: [place] CV LAB;  Service: Cardiovascular;  Laterality: N/A;  . ESOPHAGOGASTRODUODENOSCOPY N/A 05/11/2020   Procedure:  ESOPHAGOGASTRODUODENOSCOPY (EGD);  Surgeon: Daneil Dolin, MD;  [facility];  Service: Endoscopy;  Laterality: N/A;  . EYE SURGERY    . NM MYOVIEW LTD  03/31/2012   Normal study, no ECG changes, EKG negative for ischemia, post-stress EF 54%  . PACEMAKER INSERTION  05/09/2005   St. Jude [place], [place] #[id number], serial [id number], replaced 2016  . POLYPECTOMY  05/11/2020   Procedure: POLYPECTOMY;  Surgeon: Daneil Dolin, MD;  [facility];  Service: Endoscopy;;  . PROSTATE SURGERY  2002   removed- cancer    Current Medications: Outpatient Medications Prior to Visit  Medication Sig Dispense Refill  . acetaminophen (TYLENOL) 500 MG tablet Take 1,000 mg by mouth every 6 (six) hours as needed (for pain.).    Marland Kitchen amLODipine (NORVASC) 10 MG tablet Take 10 mg by mouth daily.    Marland Kitchen atorvastatin (LIPITOR) 80 MG tablet Take 80 mg by mouth at bedtime.    . chlorthalidone (HYGROTON) 25 MG tablet TAKE 1/2 TABLET BY MOUTH EVERY DAY 15 tablet 10  . cloNIDine (CATAPRES) 0.3 MG tablet Take 1 tablet (0.3 mg total) by mouth 3 (three) times daily. 270 tablet 2  . doxazosin (CARDURA) 2 MG tablet TAKE 2 TABLETS FOR 5 DAYS AND THEN RESUME 1 DAILY 35 tablet 11  . escitalopram (LEXAPRO) 5 MG tablet Take 5 mg by mouth daily.    Marland Kitchen glipiZIDE (GLUCOTROL) 5 MG tablet Take 1 tablet (5 mg total) by mouth 2 (two) times daily before a meal. 60 tablet 0  . metFORMIN (GLUCOPHAGE) 1000 MG tablet Take 1 tablet (1,000 mg total) by mouth 2 (two) times daily with a meal. 60 tablet 0  . pantoprazole (PROTONIX) 40 MG tablet Take 1 tablet (40 mg total) by mouth daily. 30 tablet 0  . valsartan (DIOVAN) 320 MG tablet Take 1 tablet (320 mg total) by mouth daily. 90 tablet 3   No facility-administered medications prior to visit.     Allergies:   Minoxidil   Social History   Socioeconomic History  . Marital status: Married    Spouse name: [name]  . Number of children: 1  . Years of education: 21  . Highest  education level: Not on file  Occupational History  . Occupation: reitred    Comment: Interior and spatial designer, retired Garment/textile technologist  Tobacco Use  . Smoking status: Former Smoker    Quit date: 06/09/1999    Years since quitting: 21.2  . Smokeless tobacco: Never Used  Substance and Sexual Activity  . Alcohol use: No  . Drug use: No  . Sexual activity: Not Currently  Other Topics Concern  . Not on file  Social History Narrative   Lives with wife   Caffeine free coffee only   Social Determinants of Health   Financial Resource Strain: Not on file  Food Insecurity: Not on file  Transportation Needs: Not on file  Physical Activity: Not on file  Stress: Not on file  Social Connections: Not on file     Family History:  The patient's family history includes CVA in his mother; Diabetes in his sister; Heart attack in his father; Heart disease in his maternal grandmother; Hypertension in his sister; Stroke in his father and mother.   ROS:   Please see the history of present illness.  ROS All other systems reviewed and are negative.   PHYSICAL EXAM:   VS:  BP 130/80   Pulse 71   Ht 5\' 5"  (1.651 m)   Wt 194 lb (88 kg)   SpO2 93%   BMI 32.28 kg/m      General: Alert, oriented x3, no distress, obese. Healthy L subclavian PM site Head: no evidence of trauma, PERRL, EOMI, no exophtalmos or lid lag, no myxedema, no xanthelasma; normal ears, nose and oropharynx Neck: normal jugular venous pulsations and no hepatojugular reflux; brisk carotid pulses without delay and no carotid bruits Chest: clear to auscultation, no signs of consolidation by percussion or palpation, normal fremitus, symmetrical and full respiratory excursions Cardiovascular: normal position and quality of the apical impulse, regular rhythm, normal first and second heart sounds, no murmurs, rubs. S4 gallop is present. Abdomen: no tenderness or distention, no masses by palpation, no abnormal pulsatility or arterial bruits, normal  bowel sounds, no hepatosplenomegaly Extremities: no clubbing, cyanosis or edema; 2+ radial, ulnar and brachial pulses bilaterally; 2+ right femoral, posterior tibial and dorsalis pedis pulses; 2+ left femoral, posterior tibial and dorsalis pedis pulses; no subclavian or femoral bruits Neurological: grossly nonfocal Psych: Normal mood and affect    Wt Readings from Last 3 Encounters:  08/28/20 194 lb (88 kg)  07/10/20 191 lb 3.2 oz (86.7 kg)  05/31/20 198 lb (89.8 kg)      Studies/Labs Reviewed:   EKG:  EKG is ordered today. It shows sinus rhythm with 1st deg AVB (PR 286 ms) and bifascicular block (RBBB+LAFB). QTc 467 ms.  Recent Labs: 03/01/2020: BUN 17; Creatinine, Ser 1.43; Potassium 3.6; Sodium 138; TSH 1.870   Lipid Panel    Component Value Date/Time   CHOL 136 12/28/2018 0000   TRIG 136 12/28/2018 0000   HDL 33 (A) 12/28/2018 0000   CHOLHDL 3.3 03/22/2016 0528   VLDL 9 03/22/2016 0528   LDLCALC 80 12/28/2018 0000   05/01/2020  creatinine 1.15, hemoglobin 10.6, hemoglobin A1c 6.7%,  total cholesterol 112, triglycerides 60, HDL 36.  ASSESSMENT:    1. Essential hypertension   2. Second degree AV block   3. SSS (sick sinus syndrome) ([place])   4. Pacemaker   5. PAT (paroxysmal atrial tachycardia) ([place])   6. Severe hypertension   7. Dyslipidemia (high LDL; low HDL)   8. DM (diabetes mellitus), type 2 with neurological complications ([place])   9. Hemiparesis due to old stroke ([place])   10. Intracranial atherosclerosis      PLAN:  In order of problems listed above:  1. Second degree AV block/SSS: Increased V pacing due to underlying second degree AV block, as well as necessary clonidine and beta blocker for HTN. 2. PM: Normal device function. 3. NLZ:[id number].  In the past, one episode of atrial tachycardia lasted for almost 2 hours, but for the most part his atrial tachycardia events are very brief, lasting no more than 2 or 3 minutes. No atrial fibrillation seen. On  bet blockers. 4. HTN: well controlled, but requiring multiple meds. History of minoxidil related pericardial effusion. 5. HLP: acceptable lipid values, chronically low HDL 6. DM: Good glycemic control. A1c 6.7%. 7. Remote ischemic CVA and recent ICH:  No new events.He has barely noticeable residual left hemiparesis.  He can walk without assistance, without recent falls or injuries. 8. Intracranial atherosclerosis: Reviewed results of previous cranial CT angiography 2017.  Avoid excessively rapid reduction in blood pressure.    Medication Adjustments/Labs and Tests Ordered: Current medicines  are reviewed at length with the patient today.  Concerns regarding medicines are outlined above.  Medication changes, Labs and Tests ordered today are listed in the Patient Instructions below. Patient Instructions  Medication Instructions:  No changes *If you need a refill on your cardiac medications before your next appointment, please call your pharmacy*   Lab Work: None ordered If you have labs (blood work) drawn today and your tests are completely normal, you will receive your results only by: Marland Kitchen MyChart Message (if you have MyChart) OR . A paper copy in the mail If you have any lab test that is abnormal or we need to change your treatment, we will call you to review the results.   Testing/Procedures: None ordered   Follow-Up: At [facility], you and your health needs are our priority.  As part of our continuing mission to provide you with exceptional heart care, we have created designated Provider Care Teams.  These Care Teams include your primary Cardiologist (physician) and Advanced Practice Providers (APPs -  Physician Assistants and Nurse Practitioners) who all work together to provide you with the care you need, when you need it.  We recommend signing up for the patient portal called "MyChart".  Sign up information is provided on this After Visit Summary.  MyChart is used to connect  with patients for Virtual Visits (Telemedicine).  Patients are able to view lab/test results, encounter notes, upcoming appointments, etc.  Non-urgent messages can be sent to your provider as well.   To learn more about what you can do with MyChart, go to [email or web address].    Your next appointment:   12 month(s)  The format for your next appointment:   In Person  Provider:   [name], MD       Signed, [name], MD  09/03/2020 7:03 PM    [address], [place], [place]  [address] Phone: [phone]; Fax: [phone]

## 2020-08-28 NOTE — Patient Instructions (Signed)

## 2020-09-03 ENCOUNTER — Encounter: Payer: Self-pay | Admitting: Cardiovascular Disease

## 2020-09-12 ENCOUNTER — Other Ambulatory Visit: Payer: Self-pay | Admitting: Cardiovascular Disease

## 2020-09-18 ENCOUNTER — Ambulatory Visit: Payer: Medicare Other | Admitting: Urology

## 2020-09-18 DIAGNOSIS — E785 Hyperlipidemia, unspecified: Secondary | ICD-10-CM | POA: Diagnosis not present

## 2020-09-18 DIAGNOSIS — N1831 Chronic kidney disease, stage 3a: Secondary | ICD-10-CM | POA: Diagnosis not present

## 2020-09-18 DIAGNOSIS — E1165 Type 2 diabetes mellitus with hyperglycemia: Secondary | ICD-10-CM | POA: Diagnosis not present

## 2020-09-18 DIAGNOSIS — Z8546 Personal history of malignant neoplasm of prostate: Secondary | ICD-10-CM | POA: Diagnosis not present

## 2020-09-18 DIAGNOSIS — I10 Essential (primary) hypertension: Secondary | ICD-10-CM | POA: Diagnosis not present

## 2020-09-18 DIAGNOSIS — D649 Anemia, unspecified: Secondary | ICD-10-CM | POA: Diagnosis not present

## 2020-09-18 DIAGNOSIS — R718 Other abnormality of red blood cells: Secondary | ICD-10-CM | POA: Diagnosis not present

## 2020-09-25 ENCOUNTER — Other Ambulatory Visit: Payer: Self-pay

## 2020-09-25 ENCOUNTER — Other Ambulatory Visit: Payer: Medicare Other

## 2020-09-25 DIAGNOSIS — K219 Gastro-esophageal reflux disease without esophagitis: Secondary | ICD-10-CM | POA: Diagnosis not present

## 2020-09-25 DIAGNOSIS — D649 Anemia, unspecified: Secondary | ICD-10-CM | POA: Diagnosis not present

## 2020-09-25 DIAGNOSIS — Z8546 Personal history of malignant neoplasm of prostate: Secondary | ICD-10-CM

## 2020-09-25 DIAGNOSIS — N1831 Chronic kidney disease, stage 3a: Secondary | ICD-10-CM | POA: Diagnosis not present

## 2020-09-25 DIAGNOSIS — I10 Essential (primary) hypertension: Secondary | ICD-10-CM | POA: Diagnosis not present

## 2020-09-25 DIAGNOSIS — G4733 Obstructive sleep apnea (adult) (pediatric): Secondary | ICD-10-CM | POA: Diagnosis not present

## 2020-09-25 DIAGNOSIS — K5900 Constipation, unspecified: Secondary | ICD-10-CM | POA: Diagnosis not present

## 2020-09-25 DIAGNOSIS — E785 Hyperlipidemia, unspecified: Secondary | ICD-10-CM | POA: Diagnosis not present

## 2020-09-25 DIAGNOSIS — M1712 Unilateral primary osteoarthritis, left knee: Secondary | ICD-10-CM | POA: Diagnosis not present

## 2020-09-25 DIAGNOSIS — E1165 Type 2 diabetes mellitus with hyperglycemia: Secondary | ICD-10-CM | POA: Diagnosis not present

## 2020-09-25 DIAGNOSIS — R718 Other abnormality of red blood cells: Secondary | ICD-10-CM | POA: Diagnosis not present

## 2020-09-25 DIAGNOSIS — F411 Generalized anxiety disorder: Secondary | ICD-10-CM | POA: Diagnosis not present

## 2020-09-26 ENCOUNTER — Other Ambulatory Visit: Payer: Self-pay | Admitting: Cardiovascular Disease

## 2020-09-26 LAB — PSA: Prostate Specific Ag, Serum: <0.1 ng/mL (ref 0.0–4.0)

## 2020-10-01 NOTE — Progress Notes (Signed)
History of Present Illness: 80 yo male presents for f/u of PCa that has been trreated.  He apparently underwent robotic-assisted radical prostatectomy by Dr. Brendia Sacks at [facility] in July 2011. The patient had stage pT2cNoMx, Gleason 4+5=9 adenocarcinoma. PSA measurements have apparently all been 0 since that time.    4.26.2022: Past year he has had no urinary difficulty.  No blood in the urine, no urinary tract infections.  Most recent PSA less than 0.01.   IPSS Questionnaire (AUA-7): Over the past month.   1)  How often have you had a sensation of not emptying your bladder completely after you finish urinating?  0 - Not at all  2)  How often have you had to urinate again less than two hours after you finished urinating? 0 - Not at all  3)  How often have you found you stopped and started again several times when you urinated?  0 - Not at all  4) How difficult have you found it to postpone urination?  0 - Not at all  5) How often have you had a weak urinary stream?  0 - Not at all  6) How often have you had to push or strain to begin urination?  0 - Not at all  7) How many times did you most typically get up to urinate from the time you went to bed until the time you got up in the morning?  1 - 1 time  Total score:  0-7 mildly symptomatic   8-19 moderately symptomatic   20-35 severely symptomatic     Past Medical History:  Diagnosis Date  . Arthritis   . Cancer [facility]) 2002   prostate  . Cerebral atherosclerosis    CAROTID DOPPLER, 09/07/2009 - RIGHT AND LEFT CCAs-small-moderate amount of irregular mixed density plaque, no significant evidence of diameter reduction; RIGHT AND LEFT ICAs-moderate amount irregular mixed density plaque, no evidence of significant diameter reduction  . CVA (cerebral vascular accident) ([place]) 1994   Left brain  . Diabetes mellitus without complication ([place])   . History of CVA (cerebrovascular accident) 04/24/2013  .  Hypercholesterolemia   . Hypertension    RENAL DOPPLER, 03/08/2009 - normal  . Hypertension in pregnancy, preeclampsia, severe 04/24/2013  . Obstructive sleep apnea 09/26/2005   AHI-19.46, during REM-36.88  . Pacemaker St. Jude victory, dual chamber, 2006 04/24/2013  . Pericardial effusion    2D ECHO, 03/07/2011 - EF >70%, normal  . Prostate cancer ([place])   . Second degree heart block 04/24/2013    Past Surgical History:  Procedure Laterality Date  . BIOPSY  05/11/2020   Procedure: BIOPSY;  Surgeon: Daneil Dolin, MD;  [facility];  Service: Endoscopy;;  . COLONOSCOPY N/A 05/11/2020   Procedure: COLONOSCOPY;  Surgeon: Daneil Dolin, MD;  [facility];  Service: Endoscopy;  Laterality: N/A;  7:30am  . EP IMPLANTABLE DEVICE N/A 11/21/2014   Procedure: PPM/BIV PPM Generator Changeout;  Surgeon: [name], MD;  Location: [place] CV LAB;  Service: Cardiovascular;  Laterality: N/A;  . ESOPHAGOGASTRODUODENOSCOPY N/A 05/11/2020   Procedure: ESOPHAGOGASTRODUODENOSCOPY (EGD);  Surgeon: Daneil Dolin, MD;  [facility];  Service: Endoscopy;  Laterality: N/A;  . EYE SURGERY    . NM MYOVIEW LTD  03/31/2012   Normal study, no ECG changes, EKG negative for ischemia, post-stress EF 54%  . PACEMAKER INSERTION  05/09/2005   St. Jude [place], [place] #[id number], serial [id number], replaced 2016  .  POLYPECTOMY  05/11/2020   Procedure: POLYPECTOMY;  Surgeon: Daneil Dolin, MD;  [facility];  Service: Endoscopy;;  . PROSTATE SURGERY  2002   removed- cancer    Home Medications:  Allergies as of 10/02/2020      Reactions   Minoxidil    Pericardial effusion      Medication List       Accurate as of October 01, 2020  7:48 PM. If you have any questions, ask your nurse or doctor.        acetaminophen 500 MG tablet Commonly known as: TYLENOL Take 1,000 mg by mouth every 6 (six) hours as needed (for pain.).   amLODipine 10 MG tablet Commonly known as:  NORVASC TAKE 1 TABLET BY MOUTH EVERY DAY   atorvastatin 80 MG tablet Commonly known as: LIPITOR Take 80 mg by mouth at bedtime.   chlorthalidone 25 MG tablet Commonly known as: HYGROTON TAKE 1/2 TABLET BY MOUTH EVERY DAY   cloNIDine 0.3 MG tablet Commonly known as: CATAPRES TAKE 1 TABLET BY MOUTH 3 TIMES DAILY.   doxazosin 2 MG tablet Commonly known as: CARDURA TAKE 2 TABLETS FOR 5 DAYS AND THEN RESUME 1 DAILY   escitalopram 5 MG tablet Commonly known as: LEXAPRO Take 5 mg by mouth daily.   glipiZIDE 5 MG tablet Commonly known as: GLUCOTROL Take 1 tablet (5 mg total) by mouth 2 (two) times daily before a meal.   metFORMIN 1000 MG tablet Commonly known as: GLUCOPHAGE Take 1 tablet (1,000 mg total) by mouth 2 (two) times daily with a meal.   pantoprazole 40 MG tablet Commonly known as: PROTONIX Take 1 tablet (40 mg total) by mouth daily.   valsartan 320 MG tablet Commonly known as: DIOVAN Take 1 tablet (320 mg total) by mouth daily.       Allergies:  Allergies  Allergen Reactions  . Minoxidil     Pericardial effusion    Family History  Problem Relation Age of Onset  . CVA Mother   . Stroke Mother   . Heart attack Father   . Stroke Father   . Hypertension Sister   . Diabetes Sister   . Heart disease Maternal Grandmother   . Colon cancer Neg Hx   . Gastric cancer Neg Hx   . Esophageal cancer Neg Hx     Social History:  reports that he quit smoking about 21 years ago. He has never used smokeless tobacco. He reports that he does not drink alcohol and does not use drugs.  ROS: A complete review of systems was performed.  All systems are negative except for pertinent findings as noted.  Physical Exam:  Vital signs in last 24 hours: There were no vitals taken for this visit. Constitutional:  Alert and oriented, No acute distress Cardiovascular: Regular rate  Respiratory: Normal respiratory effort GI: Abdomen is soft, nontender, nondistended, no  abdominal masses. No CVAT.  No trocar site hernias, no inguinal hernias. Genitourinary: Normal uncircumcised male phallus, testes are descended bilaterally and non-tender and without masses, scrotum is normal in appearance without lesions or masses, perineum is normal on inspection. Lymphatic: No lymphadenopathy Neurologic: Grossly intact, no focal deficits Psychiatric: Normal mood and affect  I have reviewed prior pt notes  I have reviewed urinalysis results  I have reviewed prior PSA results   Impression/Assessment:  H/O PCa--s/p RARP 2011--most recent PSA undetectable  ED--not treated  Plan:  I am fine with seeing him on an as-needed basis, but he  wants to come back on an annual basis, that is fine  We will check his PSA before that visit.

## 2020-10-02 ENCOUNTER — Ambulatory Visit (INDEPENDENT_AMBULATORY_CARE_PROVIDER_SITE_OTHER): Payer: Medicare Other | Admitting: Urology

## 2020-10-02 ENCOUNTER — Other Ambulatory Visit: Payer: Self-pay

## 2020-10-02 ENCOUNTER — Encounter: Payer: Self-pay | Admitting: Urology

## 2020-10-02 VITALS — BP 126/61 | HR 76 | Temp 97.4°F | Wt 197.0 lb

## 2020-10-02 DIAGNOSIS — N5231 Erectile dysfunction following radical prostatectomy: Secondary | ICD-10-CM | POA: Diagnosis not present

## 2020-10-02 DIAGNOSIS — Z8546 Personal history of malignant neoplasm of prostate: Secondary | ICD-10-CM

## 2020-10-02 LAB — URINALYSIS, ROUTINE W REFLEX MICROSCOPIC
Bilirubin, UA: NEGATIVE
Glucose, UA: NEGATIVE
Ketones, UA: NEGATIVE
Nitrite, UA: NEGATIVE
Protein,UA: NEGATIVE
Specific Gravity, UA: <1.005 — ABNORMAL LOW (ref 1.005–1.030)
Urobilinogen, Ur: 1 mg/dL (ref 0.2–1.0)
pH, UA: 6 (ref 5.0–7.5)

## 2020-10-02 LAB — MICROSCOPIC EXAMINATION
Renal Epithel, UA: NONE SEEN /hpf
WBC, UA: >30 /hpf — AB (ref 0–5)

## 2020-10-02 NOTE — Progress Notes (Signed)

## 2020-10-04 DIAGNOSIS — Z23 Encounter for immunization: Secondary | ICD-10-CM | POA: Diagnosis not present

## 2020-10-05 ENCOUNTER — Ambulatory Visit (INDEPENDENT_AMBULATORY_CARE_PROVIDER_SITE_OTHER): Payer: Medicare Other

## 2020-10-05 DIAGNOSIS — I441 Atrioventricular block, second degree: Secondary | ICD-10-CM | POA: Diagnosis not present

## 2020-10-05 LAB — CUP PACEART REMOTE DEVICE CHECK
Battery Remaining Longevity: 113 mo
Battery Remaining Percentage: 95.5 %
Battery Voltage: 2.98 V
Brady Statistic AP VP Percent: 51 %
Brady Statistic AP VS Percent: 9 %
Brady Statistic AS VP Percent: 6.6 %
Brady Statistic AS VS Percent: 33 %
Brady Statistic RA Percent Paced: 59 %
Brady Statistic RV Percent Paced: 57 %
Date Time Interrogation Session: 20220429022841
Implantable Lead Implant Date: 20061201
Implantable Lead Implant Date: 20061201
Implantable Lead Location: 753859
Implantable Lead Location: 753860
Implantable Pulse Generator Implant Date: 20160614
Lead Channel Impedance Value: 460 Ohm
Lead Channel Impedance Value: 550 Ohm
Lead Channel Pacing Threshold Amplitude: 0.375 V
Lead Channel Pacing Threshold Amplitude: 1 V
Lead Channel Pacing Threshold Pulse Width: 0.5 ms
Lead Channel Pacing Threshold Pulse Width: 0.5 ms
Lead Channel Sensing Intrinsic Amplitude: 1 mV
Lead Channel Sensing Intrinsic Amplitude: 11.7 mV
Lead Channel Setting Pacing Amplitude: 2 V
Lead Channel Setting Pacing Amplitude: 2 V
Lead Channel Setting Pacing Pulse Width: 0.5 ms
Lead Channel Setting Sensing Sensitivity: 2 mV
Pulse Gen Model: 2240
Pulse Gen Serial Number: 7768207

## 2020-10-15 ENCOUNTER — Encounter: Payer: Medicare Other | Admitting: Cardiovascular Disease

## 2020-10-15 ENCOUNTER — Telehealth: Payer: Self-pay | Admitting: Emergency Medicine

## 2020-10-15 DIAGNOSIS — I10 Essential (primary) hypertension: Secondary | ICD-10-CM

## 2020-10-15 DIAGNOSIS — I495 Sick sinus syndrome: Secondary | ICD-10-CM

## 2020-10-15 NOTE — Telephone Encounter (Signed)
Can we please have him get a BMET and Mg today? No change in meds

## 2020-10-15 NOTE — Telephone Encounter (Signed)
Left a message for the patient to call back.  Lab orders have been placed. 

## 2020-10-15 NOTE — Telephone Encounter (Signed)
Received [facility] alert for 39 beat episode of NSVT that occurred on 10/13/20 at 1400. Patient reports he has had no change in condition and was unaware of NSVT episode on 10/13/20.Will call the office if he has any changes in condition and ED precautions given for CP, chest pressure, and  Syncope.

## 2020-10-16 NOTE — Telephone Encounter (Signed)
Spoke with the patient's wife and the patient. She stated that she called back yesterday and was told by the operator that he can get the labs done at the PCP.   Attempted to call the PCP office but was unable to get through.   The patient said they were on the way to his sister in [place]. He will try and get labs done tomorrow. He is currently asymptomatic.

## 2020-10-17 ENCOUNTER — Telehealth: Payer: Self-pay | Admitting: Cardiovascular Disease

## 2020-10-17 DIAGNOSIS — I495 Sick sinus syndrome: Secondary | ICD-10-CM | POA: Diagnosis not present

## 2020-10-17 DIAGNOSIS — I10 Essential (primary) hypertension: Secondary | ICD-10-CM | POA: Diagnosis not present

## 2020-10-17 NOTE — Telephone Encounter (Signed)
The patient stated that he will go to [facility] today

## 2020-10-17 NOTE — Telephone Encounter (Signed)
Pt is returning call from yesterday to Kathryne Sharper. Please advise

## 2020-10-17 NOTE — Telephone Encounter (Signed)
Duplicate. See other enounter

## 2020-10-17 NOTE — Telephone Encounter (Signed)
Called the patient's wife back. She stated they would cal the PCP office to see if they can have the labs there and then call back.   She has been advised that they can go to any [facility] as well.

## 2020-10-18 ENCOUNTER — Telehealth: Payer: Self-pay | Admitting: *Deleted

## 2020-10-18 DIAGNOSIS — E876 Hypokalemia: Secondary | ICD-10-CM

## 2020-10-18 DIAGNOSIS — R790 Abnormal level of blood mineral: Secondary | ICD-10-CM

## 2020-10-18 LAB — BASIC METABOLIC PANEL
BUN/Creatinine Ratio: 14 (ref 10–24)
BUN: 16 mg/dL (ref 8–27)
CO2: 19 mmol/L — ABNORMAL LOW (ref 20–29)
Calcium: 9.3 mg/dL (ref 8.6–10.2)
Chloride: 98 mmol/L (ref 96–106)
Creatinine, Ser: 1.16 mg/dL (ref 0.76–1.27)
Glucose: 140 mg/dL — ABNORMAL HIGH (ref 65–99)
Potassium: 3.3 mmol/L — ABNORMAL LOW (ref 3.5–5.2)
Sodium: 138 mmol/L (ref 134–144)
eGFR: 63 mL/min/{1.73_m2} (ref >59)

## 2020-10-18 LAB — MAGNESIUM: Magnesium: 1.5 mg/dL — ABNORMAL LOW (ref 1.6–2.3)

## 2020-10-18 MED ORDER — POTASSIUM CHLORIDE CRYS ER 20 MEQ PO TBCR: 20.0000 meq | EXTENDED_RELEASE_TABLET | Freq: Every day | ORAL | 3 refills | Status: DC

## 2020-10-18 MED ORDER — MAGNESIUM OXIDE 400 MG PO CAPS
400.0000 mg | ORAL_CAPSULE | Freq: Every day | ORAL | 3 refills | Status: AC
Start: 2020-10-18 — End: ?

## 2020-10-18 NOTE — Telephone Encounter (Addendum)
-----   Message from [name], MD sent at 10/18/2020  9:06 AM EDT ----- Kidney function is better, pretty much back to baseline. Potassium and magnesium low (not surprising with chlorthalidone + furosemide). Please start KCl 20 mEq daily and MagOx 400 mg daily and recheck BMET and Mg in one month  Spoke with pt and wife, aware of dr croitoru's recommendations. New script sent to the pharmacy  Lab orders mailed to the pt

## 2020-10-25 NOTE — Progress Notes (Signed)
Remote pacemaker transmission.   

## 2020-10-30 DIAGNOSIS — K219 Gastro-esophageal reflux disease without esophagitis: Secondary | ICD-10-CM | POA: Diagnosis not present

## 2020-10-30 DIAGNOSIS — E1165 Type 2 diabetes mellitus with hyperglycemia: Secondary | ICD-10-CM | POA: Diagnosis not present

## 2020-11-16 DIAGNOSIS — R790 Abnormal level of blood mineral: Secondary | ICD-10-CM | POA: Diagnosis not present

## 2020-11-16 DIAGNOSIS — E876 Hypokalemia: Secondary | ICD-10-CM | POA: Diagnosis not present

## 2020-11-17 LAB — BASIC METABOLIC PANEL
BUN/Creatinine Ratio: 11 (ref 10–24)
BUN: 14 mg/dL (ref 8–27)
CO2: 23 mmol/L (ref 20–29)
Calcium: 9.8 mg/dL (ref 8.6–10.2)
Chloride: 99 mmol/L (ref 96–106)
Creatinine, Ser: 1.29 mg/dL — ABNORMAL HIGH (ref 0.76–1.27)
Glucose: 118 mg/dL — ABNORMAL HIGH (ref 65–99)
Potassium: 4.1 mmol/L (ref 3.5–5.2)
Sodium: 135 mmol/L (ref 134–144)
eGFR: 56 mL/min/{1.73_m2} — ABNORMAL LOW (ref >59)

## 2020-11-17 LAB — MAGNESIUM: Magnesium: 1.8 mg/dL (ref 1.6–2.3)

## 2020-11-29 ENCOUNTER — Ambulatory Visit (INDEPENDENT_AMBULATORY_CARE_PROVIDER_SITE_OTHER): Payer: Medicare Other | Admitting: Cardiovascular Disease

## 2020-11-29 ENCOUNTER — Encounter: Payer: Self-pay | Admitting: Cardiovascular Disease

## 2020-11-29 ENCOUNTER — Other Ambulatory Visit: Payer: Self-pay

## 2020-11-29 DIAGNOSIS — I10 Essential (primary) hypertension: Secondary | ICD-10-CM | POA: Diagnosis not present

## 2020-11-29 DIAGNOSIS — G4733 Obstructive sleep apnea (adult) (pediatric): Secondary | ICD-10-CM | POA: Diagnosis not present

## 2020-11-29 DIAGNOSIS — E7800 Pure hypercholesterolemia, unspecified: Secondary | ICD-10-CM | POA: Diagnosis not present

## 2020-11-29 NOTE — Assessment & Plan Note (Signed)
Not regularly using CPAP machine.

## 2020-11-29 NOTE — Assessment & Plan Note (Signed)
LDL is at goal on atorvastatin.  It was 65 on 09/2020.

## 2020-11-29 NOTE — Patient Instructions (Signed)
Medication Instructions:  ?Your physician recommends that you continue on your current medications as directed. Please refer to the Current Medication list given to you today.  ? ?Labwork: ?NONE ? ?Testing/Procedures: ?NONE ? ?Follow-Up: ?AS NEEDED  ? ?  ?

## 2020-11-29 NOTE — Assessment & Plan Note (Addendum)
Blood pressure has been much more stable lately.  Most of his readings are below his goal of 130/80.  Continue with regular exercise.  Continue current regimen of amlodipine, chlorthalidone, clonidine, doxazosin, and valsartan.  Renal artery Dopplers were within normal limits 03/2020.  Given that his blood pressure has been much better controlled, we will release him from the advanced hypertension clinic.  He will continue to follow-up with Dr. Sallyanne Kuster and follow-up with [place] as needed.  Thank you very much for referring him.

## 2020-11-29 NOTE — Progress Notes (Signed)
Cardiology Office Note   Date:  11/29/2020   ID:  Richard Alvarado, DOB Aug 18, 1940, MRN [id number]  PCP:  Richard Squibb, MD  Cardiologist:  Richard Klein, MD  Electrophysiologist:  None   Evaluation Performed:  Follow-Up Visit  Chief Complaint:  hypertension  History of Present Illness:    Richard Alvarado is a 80 y.o. male with a hx of diabetes, hypertension, hyperlipidemia, prior stroke, and second-degree AV block status post pacemaker here for follow up.  He was initially seen 02/2020 to establish care in the hypertension clinic. Lately he has struggled with his blood pressure.  He saw Dr. Loletha Alvarado on 01/2020.  Prior to that he saw his PCP and hydralazine was increased to 25 mg 4 times daily.  He complained of orthostatic dizziness, fatigue, and dry mouth.  In the office with Dr. Lurline Alvarado blood pressure was 150/85 and improved to 135/90 on repeat.  In the past his blood pressure was well-controlled on minoxidil, but this was discontinued due to a pericardial effusion.  Dr. Loletha Alvarado added chlorthalidone and he was referred to the advanced hypertension clinic.  At his initial appointment it was noted that he was taking losartan 100 mg twice daily.  This was reduced to once daily due to worsening renal function.  At his initial advanced hypertension clinic visit his blood pressure was actually well-controlled.  It was noted that he was drinking several sodas daily and that was recommended that he reduce this intake.  He was also referred for renal artery Dopplers that demonstrated normal blood flow bilaterally.  We asked him to increase his exercise to 150 minutes weekly.    [name] blood pressure remained poorly controlled.  Losartan was switched to valsartan.  His blood pressure continued to remain high so doxazosin was called into the pharmacy.  He had a virtual follow-up with our APP 11/22 and his blood pressure was better controlled.  He went for colonoscopy 05/2020.  Prior to surgery his BP got very  high whenever he was anxious.  Doxazosin was temporarily increased to 4mg .  His PCP also started him on Lexapro with 7 days of Xanax around the procedure.  He had his colonoscopy and had a 5 mm polyp at the splenic flexure. He followed up with Dr. Sallyanne Alvarado 08/2020 and his blood pressure was 130/80.   Today, he is accompanied by his son. He states he is feeling fine overall, and he presents a bp log. At home he checks his blood pressure with a cuff that sends his readings to his PCP. His blood pressure is typically averaging 130/80 or less.  For exercise, he walks with his wife about 30 minutes a day in the afternoon. For a while he feels fine, but eventually he gets fatigued and will use his cane. However, he denies shortness of breath or being unsteady on his feet. He denies any chest pain, or palpitations.  No headaches, lightheadedness, or syncope to report. Also has no lower extremity edema, orthopnea or PND.   Previous antihypertensives: Minoxidil-pericardial effusion   Past Medical History:  Diagnosis Date   Arthritis    Cancer ([place]) 2002   prostate   Cerebral atherosclerosis    CAROTID DOPPLER, 09/07/2009 - RIGHT AND LEFT CCAs-small-moderate amount of irregular mixed density plaque, no significant evidence of diameter reduction; RIGHT AND LEFT ICAs-moderate amount irregular mixed density plaque, no evidence of significant diameter reduction   CVA (cerebral vascular accident) ([place]) 1994   Left brain   Diabetes  mellitus without complication ([place])    History of CVA (cerebrovascular accident) 04/24/2013   Hypercholesterolemia    Hypertension    RENAL DOPPLER, 03/08/2009 - normal   Hypertension in pregnancy, preeclampsia, severe 04/24/2013   Obstructive sleep apnea 09/26/2005   AHI-19.46, during REM-36.88   Pacemaker St. Jude victory, dual chamber, 2006 04/24/2013   Pericardial effusion    2D ECHO, 03/07/2011 - EF >70%, normal   Prostate cancer ([place])    Second degree heart block 04/24/2013     Past Surgical History:  Procedure Laterality Date   BIOPSY  05/11/2020   Procedure: BIOPSY;  Surgeon: Richard Dolin, MD;  [facility];  Service: Endoscopy;;   COLONOSCOPY N/A 05/11/2020   Procedure: COLONOSCOPY;  Surgeon: Richard Dolin, MD;  [facility];  Service: Endoscopy;  Laterality: N/A;  7:30am   EP IMPLANTABLE DEVICE N/A 11/21/2014   Procedure: PPM/BIV PPM Generator Changeout;  Surgeon: Richard Klein, MD;  Location: [facility];  Service: Cardiovascular;  Laterality: N/A;   ESOPHAGOGASTRODUODENOSCOPY N/A 05/11/2020   Procedure: ESOPHAGOGASTRODUODENOSCOPY (EGD);  Surgeon: Richard Dolin, MD;  [facility];  Service: Endoscopy;  Laterality: N/A;   EYE SURGERY     NM MYOVIEW LTD  03/31/2012   Normal study, no ECG changes, EKG negative for ischemia, post-stress EF 54%   PACEMAKER INSERTION  05/09/2005   St. Jude Richard Alvarado, [place] #[id number], serial [id number], replaced 2016   POLYPECTOMY  05/11/2020   Procedure: POLYPECTOMY;  Surgeon: Richard Dolin, MD;  [facility];  Service: Endoscopy;;   PROSTATE SURGERY  2002   removed- cancer    Current Medications: Current Meds  Medication Sig   acetaminophen (TYLENOL) 500 MG tablet Take 1,000 mg by mouth every 6 (six) hours as needed (for pain.).   amLODipine (NORVASC) 10 MG tablet TAKE 1 TABLET BY MOUTH EVERY DAY   atorvastatin (LIPITOR) 80 MG tablet Take 80 mg by mouth at bedtime.   chlorthalidone (HYGROTON) 25 MG tablet TAKE 1/2 TABLET BY MOUTH EVERY DAY   cloNIDine (CATAPRES) 0.3 MG tablet TAKE 1 TABLET BY MOUTH 3 TIMES DAILY.   doxazosin (CARDURA) 2 MG tablet TAKE 2 TABLETS FOR 5 DAYS AND THEN RESUME 1 DAILY   escitalopram (LEXAPRO) 5 MG tablet Take 5 mg by mouth daily.   Magnesium Oxide 400 MG CAPS Take 1 capsule (400 mg total) by mouth daily.   metFORMIN (GLUCOPHAGE) 1000 MG tablet Take 1 tablet (1,000 mg total) by mouth 2 (two) times daily with a meal.   pantoprazole (PROTONIX) 40  MG tablet Take 1 tablet (40 mg total) by mouth daily.   potassium chloride SA (KLOR-CON) 20 MEQ tablet Take 1 tablet (20 mEq total) by mouth daily.   valsartan (DIOVAN) 320 MG tablet Take 1 tablet (320 mg total) by mouth daily.     Allergies:   Minoxidil   Social History   Socioeconomic History   Marital status: Married    Spouse name: [name]   Number of children: 1   Years of education: 12   Highest education level: Not on file  Occupational History   Occupation: reitred    Comment: Interior and spatial designer, retired Garment/textile technologist  Tobacco Use   Smoking status: Former    Pack years: 0.00    Types: Cigarettes    Quit date: 06/09/1999    Years since quitting: 21.4   Smokeless tobacco: Never  Substance and Sexual Activity   Alcohol use: No   Drug  use: No   Sexual activity: Not Currently  Other Topics Concern   Not on file  Social History Narrative   Lives with wife   Caffeine free coffee only   Social Determinants of Health   Financial Resource Strain: Not on file  Food Insecurity: Not on file  Transportation Needs: Not on file  Physical Activity: Not on file  Stress: Not on file  Social Connections: Not on file     Family History: The patient's family history includes CVA in his mother; Diabetes in his sister; Heart attack in his father; Heart disease in his maternal grandmother; Hypertension in his sister; Stroke in his father and mother. There is no history of Colon cancer, Gastric cancer, or Esophageal cancer.  ROS:   Please see the history of present illness.  (+)  Fatigue All other systems reviewed and are negative.  EKGs/Labs/Other Studies Reviewed:    EKG:   11/29/2020: EKG is not ordered today. 05/31/2020: EKG was not ordered.    Renal Artery Duplex 03/29/2020: Renal:  Right: Normal size right kidney. Abnormal cortical thickness of         right kidney. RRV flow present. No evidence of right renal         artery stenosis.  Left:  Normal size of left kidney. Abnormal  cortical thickness of         the left kidney. LRV flow present. No evidence of left renal         artery stenosis.   Echo 03/23/2016: - Left ventricle: The cavity size was normal. Wall thickness was    increased in a pattern of moderate LVH. Systolic function was    normal. The estimated ejection fraction was in the range of 60%    to 65%. Wall motion was normal; there were no regional wall    motion abnormalities. Doppler parameters are consistent with    abnormal left ventricular relaxation (grade 1 diastolic    dysfunction).  - Mitral valve: Mildly calcified leaflets .  - Right ventricle: Pacer wire or catheter noted in right ventricle.  - Right atrium: Pacer wire or catheter noted in right atrium.  - Atrial septum: No defect or patent foramen ovale was identified.   Recent Labs: 03/01/2020: TSH 1.870 11/16/2020: BUN 14; Creatinine, Ser 1.29; Magnesium 1.8; Potassium 4.1; Sodium 135   Recent Lipid Panel    Component Value Date/Time   CHOL 136 12/28/2018 0000   TRIG 136 12/28/2018 0000   HDL 33 (A) 12/28/2018 0000   CHOLHDL 3.3 03/22/2016 0528   VLDL 9 03/22/2016 0528   LDLCALC 80 12/28/2018 0000    Physical Exam:    VS:  BP 128/70   Pulse 63   Ht 5\' 5"  (1.651 m)   Wt 190 lb 3.2 oz (86.3 kg)   BMI 31.65 kg/m  , BMI Body mass index is 31.65 kg/m. GENERAL:  Well appearing HEENT: Pupils equal round and reactive, fundi not visualized, oral mucosa unremarkable NECK:  No jugular venous distention, waveform within normal limits, carotid upstroke brisk and symmetric, no bruits LUNGS:  Clear to auscultation bilaterally HEART:  RRR.  PMI not displaced or sustained,S1 and S2 within normal limits, no S3, no S4, no clicks, no rubs, no murmurs ABD:  Flat, positive bowel sounds normal in frequency in pitch, no bruits, no rebound, no guarding, no midline pulsatile mass, no hepatomegaly, no splenomegaly EXT:  2 plus pulses throughout, no edema, no cyanosis no clubbing SKIN:  No rashes  no nodules NEURO:  Cranial nerves II through XII grossly intact, motor grossly intact throughout PSYCH:  Cognitively intact, oriented to person place and time   ASSESSMENT:    1. Resistant hypertension   2. OSA (obstructive sleep apnea)   3. Hypercholesterolemia      PLAN:   Resistant hypertension Blood pressure has been much more stable lately.  Most of his readings are below his goal of 130/80.  Continue with regular exercise.  Continue current regimen of amlodipine, chlorthalidone, clonidine, doxazosin, and valsartan.  Renal artery Dopplers were within normal limits 03/2020.  Given that his blood pressure has been much better controlled, we will release him from the advanced hypertension clinic.  He will continue to follow-up with Dr. Sallyanne Alvarado and follow-up with [place] as needed.  Thank you very much for referring him.  OSA (obstructive sleep apnea) Not regularly using CPAP machine.  Hypercholesterolemia LDL is at goal on atorvastatin.  It was 65 on 09/2020.    Medication Adjustments/Labs and Tests Ordered: Current medicines are reviewed at length with the patient today.  Concerns regarding medicines are outlined above.   No orders of the defined types were placed in this encounter.  No orders of the defined types were placed in this encounter.  I,Mathew Stumpf,acting as a Education administrator for Skeet Latch, MD.,have documented all relevant documentation on the behalf of Skeet Latch, MD,as directed by  Skeet Latch, MD while in the presence of Skeet Latch, MD.  I, [place] Oval Linsey, MD have reviewed all documentation for this visit.  The documentation of the exam, diagnosis, procedures, and orders on 11/29/2020 are all accurate and complete.    Signed, Skeet Latch, MD  11/29/2020 9:12 AM    [place]

## 2020-12-06 DIAGNOSIS — K219 Gastro-esophageal reflux disease without esophagitis: Secondary | ICD-10-CM | POA: Diagnosis not present

## 2020-12-06 DIAGNOSIS — E1165 Type 2 diabetes mellitus with hyperglycemia: Secondary | ICD-10-CM | POA: Diagnosis not present

## 2020-12-06 DIAGNOSIS — I10 Essential (primary) hypertension: Secondary | ICD-10-CM | POA: Diagnosis not present

## 2020-12-06 DIAGNOSIS — E7800 Pure hypercholesterolemia, unspecified: Secondary | ICD-10-CM | POA: Diagnosis not present

## 2021-01-04 ENCOUNTER — Ambulatory Visit (INDEPENDENT_AMBULATORY_CARE_PROVIDER_SITE_OTHER): Payer: Medicare Other

## 2021-01-04 DIAGNOSIS — I495 Sick sinus syndrome: Secondary | ICD-10-CM | POA: Diagnosis not present

## 2021-01-05 LAB — CUP PACEART REMOTE DEVICE CHECK
Battery Remaining Longevity: 49 mo
Battery Remaining Percentage: 44 %
Battery Voltage: 2.98 V
Brady Statistic AP VP Percent: 61 %
Brady Statistic AP VS Percent: 6.7 %
Brady Statistic AS VP Percent: 7.1 %
Brady Statistic AS VS Percent: 25 %
Brady Statistic RA Percent Paced: 67 %
Brady Statistic RV Percent Paced: 68 %
Date Time Interrogation Session: 20220729020014
Implantable Lead Implant Date: 20061201
Implantable Lead Implant Date: 20061201
Implantable Lead Location: 753859
Implantable Lead Location: 753860
Implantable Pulse Generator Implant Date: 20160614
Lead Channel Impedance Value: 510 Ohm
Lead Channel Impedance Value: 590 Ohm
Lead Channel Pacing Threshold Amplitude: 0.375 V
Lead Channel Pacing Threshold Amplitude: 1 V
Lead Channel Pacing Threshold Pulse Width: 0.5 ms
Lead Channel Pacing Threshold Pulse Width: 0.5 ms
Lead Channel Sensing Intrinsic Amplitude: 1.1 mV
Lead Channel Sensing Intrinsic Amplitude: 12 mV
Lead Channel Setting Pacing Amplitude: 2 V
Lead Channel Setting Pacing Amplitude: 2 V
Lead Channel Setting Pacing Pulse Width: 0.5 ms
Lead Channel Setting Sensing Sensitivity: 2 mV
Pulse Gen Model: 2240
Pulse Gen Serial Number: 7768207

## 2021-01-06 DIAGNOSIS — E7800 Pure hypercholesterolemia, unspecified: Secondary | ICD-10-CM | POA: Diagnosis not present

## 2021-01-06 DIAGNOSIS — I10 Essential (primary) hypertension: Secondary | ICD-10-CM | POA: Diagnosis not present

## 2021-01-29 NOTE — Progress Notes (Signed)
Remote pacemaker transmission.   

## 2021-01-31 DIAGNOSIS — I10 Essential (primary) hypertension: Secondary | ICD-10-CM | POA: Diagnosis not present

## 2021-01-31 DIAGNOSIS — E1165 Type 2 diabetes mellitus with hyperglycemia: Secondary | ICD-10-CM | POA: Diagnosis not present

## 2021-02-04 DIAGNOSIS — D649 Anemia, unspecified: Secondary | ICD-10-CM | POA: Diagnosis not present

## 2021-02-04 DIAGNOSIS — I10 Essential (primary) hypertension: Secondary | ICD-10-CM | POA: Diagnosis not present

## 2021-02-04 DIAGNOSIS — E785 Hyperlipidemia, unspecified: Secondary | ICD-10-CM | POA: Diagnosis not present

## 2021-02-04 DIAGNOSIS — N183 Chronic kidney disease, stage 3 unspecified: Secondary | ICD-10-CM | POA: Diagnosis not present

## 2021-02-04 DIAGNOSIS — E1122 Type 2 diabetes mellitus with diabetic chronic kidney disease: Secondary | ICD-10-CM | POA: Diagnosis not present

## 2021-02-06 DIAGNOSIS — E119 Type 2 diabetes mellitus without complications: Secondary | ICD-10-CM | POA: Diagnosis not present

## 2021-02-06 DIAGNOSIS — I10 Essential (primary) hypertension: Secondary | ICD-10-CM | POA: Diagnosis not present

## 2021-02-12 ENCOUNTER — Other Ambulatory Visit: Payer: Self-pay | Admitting: Cardiovascular Disease

## 2021-03-08 DIAGNOSIS — E785 Hyperlipidemia, unspecified: Secondary | ICD-10-CM | POA: Diagnosis not present

## 2021-03-08 DIAGNOSIS — I10 Essential (primary) hypertension: Secondary | ICD-10-CM | POA: Diagnosis not present

## 2021-04-02 ENCOUNTER — Other Ambulatory Visit: Payer: Self-pay | Admitting: Cardiovascular Disease

## 2021-04-05 ENCOUNTER — Ambulatory Visit (INDEPENDENT_AMBULATORY_CARE_PROVIDER_SITE_OTHER): Payer: Medicare Other

## 2021-04-05 DIAGNOSIS — I495 Sick sinus syndrome: Secondary | ICD-10-CM

## 2021-04-05 LAB — CUP PACEART REMOTE DEVICE CHECK
Battery Remaining Longevity: 46 mo
Battery Remaining Percentage: 42 %
Battery Voltage: 2.98 V
Brady Statistic AP VP Percent: 58 %
Brady Statistic AP VS Percent: 6.8 %
Brady Statistic AS VP Percent: 7.7 %
Brady Statistic AS VS Percent: 27 %
Brady Statistic RA Percent Paced: 65 %
Brady Statistic RV Percent Paced: 66 %
Date Time Interrogation Session: 20221028020012
Implantable Lead Implant Date: 20061201
Implantable Lead Implant Date: 20061201
Implantable Lead Location: 753859
Implantable Lead Location: 753860
Implantable Pulse Generator Implant Date: 20160614
Lead Channel Impedance Value: 460 Ohm
Lead Channel Impedance Value: 550 Ohm
Lead Channel Pacing Threshold Amplitude: 0.375 V
Lead Channel Pacing Threshold Amplitude: 1 V
Lead Channel Pacing Threshold Pulse Width: 0.5 ms
Lead Channel Pacing Threshold Pulse Width: 0.5 ms
Lead Channel Sensing Intrinsic Amplitude: 1.6 mV
Lead Channel Sensing Intrinsic Amplitude: 10.3 mV
Lead Channel Setting Pacing Amplitude: 2 V
Lead Channel Setting Pacing Amplitude: 2 V
Lead Channel Setting Pacing Pulse Width: 0.5 ms
Lead Channel Setting Sensing Sensitivity: 2 mV
Pulse Gen Model: 2240
Pulse Gen Serial Number: 7768207

## 2021-04-08 ENCOUNTER — Telehealth: Payer: Self-pay

## 2021-04-08 DIAGNOSIS — E782 Mixed hyperlipidemia: Secondary | ICD-10-CM | POA: Diagnosis not present

## 2021-04-08 DIAGNOSIS — I10 Essential (primary) hypertension: Secondary | ICD-10-CM | POA: Diagnosis not present

## 2021-04-08 NOTE — Telephone Encounter (Signed)
-----   Message from [name], MD sent at 04/07/2021  4:09 PM EDT ----- Remote reviewed.   Not pacemaker dependent. Battery status is good. Lead measurements are stable, but last atrial autocap "unknown". Heart rate histogram is favorable. Occasional atrial tachycardia, usually brief. No true atrial fibrillation.   Please get another download next week, bring in to office for manual lead check if autocap still out of range, please.

## 2021-04-08 NOTE — Telephone Encounter (Signed)
Spoke with pt wife.  Assisted with sending manual transmission from device.    Device is programmed to monitor Atrial threshold.  Needs to be tested in person and reprogrammed as indicated.    Pt scheduled for n-clinic check in device clinic on 04/11/21 at 9:20am.

## 2021-04-11 ENCOUNTER — Ambulatory Visit (INDEPENDENT_AMBULATORY_CARE_PROVIDER_SITE_OTHER): Payer: Medicare Other

## 2021-04-11 ENCOUNTER — Other Ambulatory Visit: Payer: Self-pay

## 2021-04-11 DIAGNOSIS — I441 Atrioventricular block, second degree: Secondary | ICD-10-CM

## 2021-04-11 LAB — CUP PACEART INCLINIC DEVICE CHECK
Battery Remaining Longevity: 46 mo
Battery Voltage: 2.96 V
Brady Statistic RA Percent Paced: 64 %
Brady Statistic RV Percent Paced: 65 %
Date Time Interrogation Session: 20221103093411
Implantable Lead Implant Date: 20061201
Implantable Lead Implant Date: 20061201
Implantable Lead Location: 753859
Implantable Lead Location: 753860
Implantable Pulse Generator Implant Date: 20160614
Lead Channel Impedance Value: 462.5 Ohm
Lead Channel Impedance Value: 562.5 Ohm
Lead Channel Pacing Threshold Amplitude: 0.5 V
Lead Channel Pacing Threshold Amplitude: 1 V
Lead Channel Pacing Threshold Pulse Width: 0.5 ms
Lead Channel Pacing Threshold Pulse Width: 0.5 ms
Lead Channel Sensing Intrinsic Amplitude: 1.1 mV
Lead Channel Sensing Intrinsic Amplitude: 12 mV
Lead Channel Setting Pacing Amplitude: 2 V
Lead Channel Setting Pacing Amplitude: 2 V
Lead Channel Setting Pacing Pulse Width: 0.5 ms
Lead Channel Setting Sensing Sensitivity: 2 mV
Pulse Gen Model: 2240
Pulse Gen Serial Number: 7768207

## 2021-04-11 NOTE — Progress Notes (Signed)
Pacemaker check in clinic. Normal device function. Thresholds, sensing, impedances consistent with previous measurements. Device programmed to maximize longevity. AT/AF burden <1%. No high ventricular rates noted. Device programmed at appropriate safety margins. Programmed RA monitor off d/t false alerts. RA threshold was manually checked today and 0.5V @ 0.6ms. Histogram distribution appropriate for patient activity level. Device programmed to optimize intrinsic conduction. Estimated longevity 3.8 years. Patient enrolled in remote follow-up 07/05/20. Patient education completed.

## 2021-04-15 NOTE — Progress Notes (Signed)
Remote pacemaker transmission.   

## 2021-05-08 ENCOUNTER — Other Ambulatory Visit: Payer: Self-pay | Admitting: Cardiovascular Disease

## 2021-05-08 DIAGNOSIS — E782 Mixed hyperlipidemia: Secondary | ICD-10-CM | POA: Diagnosis not present

## 2021-05-08 DIAGNOSIS — I10 Essential (primary) hypertension: Secondary | ICD-10-CM | POA: Diagnosis not present

## 2021-05-16 DIAGNOSIS — Z23 Encounter for immunization: Secondary | ICD-10-CM | POA: Diagnosis not present

## 2021-06-07 DIAGNOSIS — I10 Essential (primary) hypertension: Secondary | ICD-10-CM | POA: Diagnosis not present

## 2021-06-07 DIAGNOSIS — E782 Mixed hyperlipidemia: Secondary | ICD-10-CM | POA: Diagnosis not present

## 2021-07-05 ENCOUNTER — Ambulatory Visit (INDEPENDENT_AMBULATORY_CARE_PROVIDER_SITE_OTHER): Payer: Medicare Other

## 2021-07-05 DIAGNOSIS — I495 Sick sinus syndrome: Secondary | ICD-10-CM | POA: Diagnosis not present

## 2021-07-05 LAB — CUP PACEART REMOTE DEVICE CHECK
Battery Remaining Longevity: 44 mo
Battery Remaining Percentage: 39 %
Battery Voltage: 2.96 V
Brady Statistic AP VP Percent: 41 %
Brady Statistic AP VS Percent: 8.1 %
Brady Statistic AS VP Percent: 11 %
Brady Statistic AS VS Percent: 40 %
Brady Statistic RA Percent Paced: 49 %
Brady Statistic RV Percent Paced: 52 %
Date Time Interrogation Session: 20230127020013
Implantable Lead Implant Date: 20061201
Implantable Lead Implant Date: 20061201
Implantable Lead Location: 753859
Implantable Lead Location: 753860
Implantable Pulse Generator Implant Date: 20160614
Lead Channel Impedance Value: 480 Ohm
Lead Channel Impedance Value: 560 Ohm
Lead Channel Pacing Threshold Amplitude: 0.5 V
Lead Channel Pacing Threshold Amplitude: 1 V
Lead Channel Pacing Threshold Pulse Width: 0.5 ms
Lead Channel Pacing Threshold Pulse Width: 0.5 ms
Lead Channel Sensing Intrinsic Amplitude: 1.4 mV
Lead Channel Sensing Intrinsic Amplitude: 10.2 mV
Lead Channel Setting Pacing Amplitude: 2 V
Lead Channel Setting Pacing Amplitude: 2 V
Lead Channel Setting Pacing Pulse Width: 0.5 ms
Lead Channel Setting Sensing Sensitivity: 2 mV
Pulse Gen Model: 2240
Pulse Gen Serial Number: 7768207

## 2021-07-09 DIAGNOSIS — I10 Essential (primary) hypertension: Secondary | ICD-10-CM | POA: Diagnosis not present

## 2021-07-09 DIAGNOSIS — E785 Hyperlipidemia, unspecified: Secondary | ICD-10-CM | POA: Diagnosis not present

## 2021-07-15 NOTE — Progress Notes (Signed)
Remote pacemaker transmission.   

## 2021-08-05 DIAGNOSIS — D649 Anemia, unspecified: Secondary | ICD-10-CM | POA: Diagnosis not present

## 2021-08-05 DIAGNOSIS — I10 Essential (primary) hypertension: Secondary | ICD-10-CM | POA: Diagnosis not present

## 2021-08-05 DIAGNOSIS — Z20822 Contact with and (suspected) exposure to covid-19: Secondary | ICD-10-CM | POA: Diagnosis not present

## 2021-08-05 DIAGNOSIS — E1122 Type 2 diabetes mellitus with diabetic chronic kidney disease: Secondary | ICD-10-CM | POA: Diagnosis not present

## 2021-08-06 DIAGNOSIS — I10 Essential (primary) hypertension: Secondary | ICD-10-CM | POA: Diagnosis not present

## 2021-08-06 DIAGNOSIS — E119 Type 2 diabetes mellitus without complications: Secondary | ICD-10-CM | POA: Diagnosis not present

## 2021-08-07 DIAGNOSIS — Z20822 Contact with and (suspected) exposure to covid-19: Secondary | ICD-10-CM | POA: Diagnosis not present

## 2021-08-12 DIAGNOSIS — E1122 Type 2 diabetes mellitus with diabetic chronic kidney disease: Secondary | ICD-10-CM | POA: Diagnosis not present

## 2021-08-12 DIAGNOSIS — D649 Anemia, unspecified: Secondary | ICD-10-CM | POA: Diagnosis not present

## 2021-08-12 DIAGNOSIS — Z0001 Encounter for general adult medical examination with abnormal findings: Secondary | ICD-10-CM | POA: Diagnosis not present

## 2021-08-12 DIAGNOSIS — E785 Hyperlipidemia, unspecified: Secondary | ICD-10-CM | POA: Diagnosis not present

## 2021-08-12 DIAGNOSIS — I10 Essential (primary) hypertension: Secondary | ICD-10-CM | POA: Diagnosis not present

## 2021-08-12 DIAGNOSIS — N183 Chronic kidney disease, stage 3 unspecified: Secondary | ICD-10-CM | POA: Diagnosis not present

## 2021-08-26 ENCOUNTER — Other Ambulatory Visit: Payer: Self-pay | Admitting: Cardiovascular Disease

## 2021-08-26 DIAGNOSIS — E876 Hypokalemia: Secondary | ICD-10-CM

## 2021-09-02 DIAGNOSIS — Z20822 Contact with and (suspected) exposure to covid-19: Secondary | ICD-10-CM | POA: Diagnosis not present

## 2021-09-06 DIAGNOSIS — E119 Type 2 diabetes mellitus without complications: Secondary | ICD-10-CM | POA: Diagnosis not present

## 2021-09-06 DIAGNOSIS — I10 Essential (primary) hypertension: Secondary | ICD-10-CM | POA: Diagnosis not present

## 2021-09-07 DIAGNOSIS — Z20822 Contact with and (suspected) exposure to covid-19: Secondary | ICD-10-CM | POA: Diagnosis not present

## 2021-09-10 DIAGNOSIS — Z20822 Contact with and (suspected) exposure to covid-19: Secondary | ICD-10-CM | POA: Diagnosis not present

## 2021-09-24 ENCOUNTER — Telehealth: Payer: Self-pay

## 2021-09-24 NOTE — Telephone Encounter (Signed)
Pt states he needs to make his yearly appointment with Dr. Sallyanne Kuster. I told him I will have the scheduler call him to set that up. ?

## 2021-09-27 ENCOUNTER — Other Ambulatory Visit: Payer: Medicare Other

## 2021-09-27 DIAGNOSIS — Z8546 Personal history of malignant neoplasm of prostate: Secondary | ICD-10-CM

## 2021-09-28 LAB — PSA: Prostate Specific Ag, Serum: <0.1 ng/mL (ref 0.0–4.0)

## 2021-09-30 NOTE — Progress Notes (Signed)
History of Present Illness:  ? ? 81 yo Richard Alvarado presents for f/u of PCa that has been trreated. ?  ?He apparently underwent robotic-assisted radical prostatectomy by Dr. Brendia Sacks at [facility] in July 2011. The patient had stage pT2cNoMx, Gleason 4+5=9 adenocarcinoma. PSA measurements have apparently all been 0 since that time.  ?  ?  ?4.25.2023: He is having no urinary complaints.  He does not have leakage.  Most recent PSA from last week is undetectable. ? ?Past Medical History:  ?Diagnosis Date  ? Arthritis   ? Cancer [facility]) 2002  ? prostate  ? Cerebral atherosclerosis   ? CAROTID DOPPLER, 09/07/2009 - RIGHT AND LEFT CCAs-small-moderate amount of irregular mixed density plaque, no significant evidence of diameter reduction; RIGHT AND LEFT ICAs-moderate amount irregular mixed density plaque, no evidence of significant diameter reduction  ? CVA (cerebral vascular accident) ([place]) 1994  ? Left brain  ? Diabetes mellitus without complication ([place])   ? History of CVA (cerebrovascular accident) 04/24/2013  ? Hypercholesterolemia   ? Hypertension   ? RENAL DOPPLER, 03/08/2009 - normal  ? Hypertension in pregnancy, preeclampsia, severe 04/24/2013  ? Obstructive sleep apnea 09/26/2005  ? AHI-19.46, during REM-36.88  ? Pacemaker St. Jude victory, dual chamber, 2006 04/24/2013  ? Pericardial effusion   ? 2D ECHO, 03/07/2011 - EF >70%, normal  ? Prostate cancer ([place])   ? Second degree heart block 04/24/2013  ? ? ?Past Surgical History:  ?Procedure Laterality Date  ? BIOPSY  05/11/2020  ? Procedure: BIOPSY;  Surgeon: Daneil Dolin, MD;  [facility];  Service: Endoscopy;;  ? COLONOSCOPY N/A 05/11/2020  ? Procedure: COLONOSCOPY;  Surgeon: Daneil Dolin, MD;  [facility];  Service: Endoscopy;  Laterality: N/A;  7:30am  ? EP IMPLANTABLE DEVICE N/A 11/21/2014  ? Procedure: PPM/BIV PPM Generator Changeout;  Surgeon: [name], MD;  Location: [place] [facility];  Service:  Cardiovascular;  Laterality: N/A;  ? ESOPHAGOGASTRODUODENOSCOPY N/A 05/11/2020  ? Procedure: ESOPHAGOGASTRODUODENOSCOPY (EGD);  Surgeon: Daneil Dolin, MD;  [facility];  Service: Endoscopy;  Laterality: N/A;  ? EYE SURGERY    ? NM MYOVIEW LTD  03/31/2012  ? Normal study, no ECG changes, EKG negative for ischemia, post-stress EF 54%  ? PACEMAKER INSERTION  05/09/2005  ? [place], [place] #[id number], serial [phone], replaced 2016  ? POLYPECTOMY  05/11/2020  ? Procedure: POLYPECTOMY;  Surgeon: Daneil Dolin, MD;  [facility];  Service: Endoscopy;;  ? PROSTATE SURGERY  2002  ? removed- cancer  ? ? ?Home Medications:  ?Allergies as of 10/01/2021   ? ?   Reactions  ? Minoxidil   ? Pericardial effusion  ? ?  ? ?  ?Medication List  ?  ? ?  ? Accurate as of September 30, 2021  8:12 PM. If you have any questions, ask your nurse or doctor.  ?  ?  ? ?  ? ?acetaminophen 500 MG tablet ?Commonly known as: TYLENOL ?Take 1,000 mg by mouth every 6 (six) hours as needed (for pain.). ?  ?amLODipine 10 MG tablet ?Commonly known as: NORVASC ?TAKE 1 TABLET BY MOUTH EVERY DAY ?  ?atorvastatin 80 MG tablet ?Commonly known as: LIPITOR ?Take 80 mg by mouth at bedtime. ?  ?chlorthalidone 25 MG tablet ?Commonly known as: HYGROTON ?TAKE 1/2 TABLET BY MOUTH EVERY DAY ?  ?cloNIDine 0.3 MG tablet ?Commonly known as: CATAPRES ?TAKE 1 TABLET BY MOUTH THREE TIMES A  DAY ?  ?doxazosin 2 MG tablet ?Commonly known as: CARDURA ?TAKE 1 TABLET BY MOUTH EVERY DAY ?  ?escitalopram 5 MG tablet ?Commonly known as: LEXAPRO ?Take 5 mg by mouth daily. ?  ?Klor-Con M20 20 MEQ tablet ?Generic drug: potassium chloride SA ?TAKE 1 TABLET BY MOUTH EVERY DAY ?  ?Magnesium Oxide 400 MG Caps ?Take 1 capsule (400 mg total) by mouth daily. ?  ?metFORMIN 1000 MG tablet ?Commonly known as: GLUCOPHAGE ?Take 1 tablet (1,000 mg total) by mouth 2 (two) times daily with a meal. ?  ?pantoprazole 40 MG tablet ?Commonly known as: PROTONIX ?Take 1 tablet (40 mg  total) by mouth daily. ?  ?valsartan 320 MG tablet ?Commonly known as: DIOVAN ?TAKE 1 TABLET BY MOUTH EVERY DAY ?  ? ?  ? ? ?Allergies:  ?Allergies  ?Allergen Reactions  ? Minoxidil   ?  Pericardial effusion  ? ? ?Family History  ?Problem Relation Age of Onset  ? CVA Mother   ? Stroke Mother   ? Heart attack Father   ? Stroke Father   ? Hypertension Sister   ? Diabetes Sister   ? Heart disease Maternal Grandmother   ? Colon cancer Neg Hx   ? Gastric cancer Neg Hx   ? Esophageal cancer Neg Hx   ? ? ?Social History:  reports that he quit smoking about 22 years ago. He has never used smokeless tobacco. He reports that he does not drink alcohol and does not use drugs. ? ?ROS: ?A complete review of systems was performed.  All systems are negative except for pertinent findings as noted. ? ?Physical Exam:  ?Vital signs in last 24 hours: ?There were no vitals taken for this visit. ?Constitutional:  Alert and oriented, No acute distress ?Cardiovascular: Regular rate  ?Respiratory: Normal respiratory effort ?GI: Abdomen is soft, nontender, nondistended, no abdominal masses. No CVAT.  There are no incisional hernias ?Genitourinary: Normal Richard Alvarado phallus, testes are descended bilaterally and non-tender and without masses, scrotum is normal in appearance without lesions or masses, perineum is normal on inspection. ?Lymphatic: No lymphadenopathy ?Neurologic: Grossly intact, no focal deficits ?Psychiatric: Normal mood and affect ? ?I have reviewed prior pt notes ? ?I have reviewed urinalysis results ? ?I have reviewed prior PSA results ? ? ? ?Impression/Assessment:  ?History of prostate cancer, 12 years out from robotic assisted radical prostatectomy without recurrence ? ?Plan:  ?I recommended that he just have his PSA checked with Dr. [place] Crane on an annual basis-I do not think he needs to come back here for that ? ?He will return here as needed ? ?

## 2021-10-01 ENCOUNTER — Ambulatory Visit (INDEPENDENT_AMBULATORY_CARE_PROVIDER_SITE_OTHER): Payer: Medicare Other | Admitting: Urology

## 2021-10-01 ENCOUNTER — Encounter: Payer: Self-pay | Admitting: Urology

## 2021-10-01 VITALS — BP 167/78 | HR 77

## 2021-10-01 DIAGNOSIS — N5231 Erectile dysfunction following radical prostatectomy: Secondary | ICD-10-CM | POA: Diagnosis not present

## 2021-10-01 DIAGNOSIS — Z8546 Personal history of malignant neoplasm of prostate: Secondary | ICD-10-CM | POA: Diagnosis not present

## 2021-10-01 LAB — URINALYSIS, ROUTINE W REFLEX MICROSCOPIC
Bilirubin, UA: NEGATIVE
Glucose, UA: NEGATIVE
Ketones, UA: NEGATIVE
Leukocytes,UA: NEGATIVE
Nitrite, UA: NEGATIVE
Protein,UA: NEGATIVE
RBC, UA: NEGATIVE
Specific Gravity, UA: 1.01 (ref 1.005–1.030)
Urobilinogen, Ur: 0.2 mg/dL (ref 0.2–1.0)
pH, UA: 6 (ref 5.0–7.5)

## 2021-10-04 ENCOUNTER — Ambulatory Visit (INDEPENDENT_AMBULATORY_CARE_PROVIDER_SITE_OTHER): Payer: Medicare Other

## 2021-10-04 DIAGNOSIS — I495 Sick sinus syndrome: Secondary | ICD-10-CM | POA: Diagnosis not present

## 2021-10-04 LAB — CUP PACEART REMOTE DEVICE CHECK
Battery Remaining Longevity: 43 mo
Battery Remaining Percentage: 37 %
Battery Voltage: 2.96 V
Brady Statistic AP VP Percent: 37 %
Brady Statistic AP VS Percent: 8.7 %
Brady Statistic AS VP Percent: 9.6 %
Brady Statistic AS VS Percent: 45 %
Brady Statistic RA Percent Paced: 45 %
Brady Statistic RV Percent Paced: 46 %
Date Time Interrogation Session: 20230428020017
Implantable Lead Implant Date: 20061201
Implantable Lead Implant Date: 20061201
Implantable Lead Location: 753859
Implantable Lead Location: 753860
Implantable Pulse Generator Implant Date: 20160614
Lead Channel Impedance Value: 450 Ohm
Lead Channel Impedance Value: 560 Ohm
Lead Channel Pacing Threshold Amplitude: 0.5 V
Lead Channel Pacing Threshold Amplitude: 1 V
Lead Channel Pacing Threshold Pulse Width: 0.5 ms
Lead Channel Pacing Threshold Pulse Width: 0.5 ms
Lead Channel Sensing Intrinsic Amplitude: 1.6 mV
Lead Channel Sensing Intrinsic Amplitude: 10.9 mV
Lead Channel Setting Pacing Amplitude: 2 V
Lead Channel Setting Pacing Amplitude: 2 V
Lead Channel Setting Pacing Pulse Width: 0.5 ms
Lead Channel Setting Sensing Sensitivity: 2 mV
Pulse Gen Model: 2240
Pulse Gen Serial Number: 7768207

## 2021-10-07 DIAGNOSIS — Z20822 Contact with and (suspected) exposure to covid-19: Secondary | ICD-10-CM | POA: Diagnosis not present

## 2021-10-14 ENCOUNTER — Ambulatory Visit (INDEPENDENT_AMBULATORY_CARE_PROVIDER_SITE_OTHER): Payer: Medicare Other | Admitting: Cardiovascular Disease

## 2021-10-14 ENCOUNTER — Encounter: Payer: Self-pay | Admitting: Cardiovascular Disease

## 2021-10-14 VITALS — BP 140/76 | HR 60 | Ht 69.0 in | Wt 208.8 lb

## 2021-10-14 DIAGNOSIS — Z8679 Personal history of other diseases of the circulatory system: Secondary | ICD-10-CM

## 2021-10-14 DIAGNOSIS — I495 Sick sinus syndrome: Secondary | ICD-10-CM

## 2021-10-14 DIAGNOSIS — I10 Essential (primary) hypertension: Secondary | ICD-10-CM | POA: Diagnosis not present

## 2021-10-14 DIAGNOSIS — I441 Atrioventricular block, second degree: Secondary | ICD-10-CM | POA: Diagnosis not present

## 2021-10-14 DIAGNOSIS — I471 Supraventricular tachycardia: Secondary | ICD-10-CM | POA: Diagnosis not present

## 2021-10-14 DIAGNOSIS — E785 Hyperlipidemia, unspecified: Secondary | ICD-10-CM

## 2021-10-14 DIAGNOSIS — I672 Cerebral atherosclerosis: Secondary | ICD-10-CM

## 2021-10-14 DIAGNOSIS — I4719 Other supraventricular tachycardia: Secondary | ICD-10-CM

## 2021-10-14 DIAGNOSIS — Z950 Presence of cardiac pacemaker: Secondary | ICD-10-CM

## 2021-10-14 DIAGNOSIS — E1149 Type 2 diabetes mellitus with other diabetic neurological complication: Secondary | ICD-10-CM | POA: Diagnosis not present

## 2021-10-14 NOTE — Patient Instructions (Signed)

## 2021-10-14 NOTE — Progress Notes (Signed)
? ?Cardiology Office Note   ? ?Date:  10/20/2021  ? ?ID:  Richard Alvarado, DOB 1941-01-31, MRN [id number] ? ?PCP:  Celene Squibb, MD  ?Cardiologist:   [name], MD  ? ?Chief Complaint  ?Patient presents with  ? Pacemaker Check  ? ? ?History of Present Illness:  ?Richard Alvarado is a 81 y.o. male with second-degree atrioventricular block returning for pacemaker follow-up. He has hypertension, hyperlipidemia, DM and history of remote stroke. He had the pacemaker initially implanted in 2006 and underwent a pacemaker generator change out in June 2016 ([place]).   ? ?He is feeling well The patient specifically denies any chest pain at rest exertion, dyspnea at rest or with exertion, orthopnea, paroxysmal nocturnal dyspnea, syncope, palpitations, focal neurological deficits, intermittent claudication, lower extremity edema, unexplained weight gain, cough, hemoptysis or wheezing. ? ?He has gained about 10 lb. ? ?Most recent labs performed in February 2023 showed stable creatinine of 1.13, hemoglobin 12.5, hemoglobin A1c 7.1%, total cholesterol 123, triglycerides 129, HDL 29 (worse) LDL 71. ? ?Pacemaker interrogation shows normal device function with estimated generator longevity of 3.5 years.  He now has 45% atrial and 46% ventricular pacing.  He continues to have occasional atrial tachycardia, sustained but usually brief (overall burden <1%). One episode lasted for 35 minutes, most were under 5 minutes. No atrial fibrillation).  ? ?He has a long-standing history of very severe and hard to control systemic hypertension. At one point he was taking minoxidil but this was stopped for a pericardial effusion. 20 years ago he had a left brain stroke from which she has recovered without sequelae.  In October 2017 he had hemorrhage in the right posterior basal ganglia.  CT of the head shows extensive intracranial atherosclerosis with dense calcification of the carotid siphons, suspected at least moderate  stenosis of the right vertebral artery origin, but no areas of severe stenosis in the circle of Willis.  Pertinent negatives include the absence of coronary insufficiency by nuclear stress testing and the absence of renal artery stenosis by ultrasonography.   ? ? ?Past Medical History:  ?Diagnosis Date  ? Arthritis   ? Cancer [facility]) 2002  ? prostate  ? Cerebral atherosclerosis   ? CAROTID DOPPLER, 09/07/2009 - RIGHT AND LEFT CCAs-small-moderate amount of irregular mixed density plaque, no significant evidence of diameter reduction; RIGHT AND LEFT ICAs-moderate amount irregular mixed density plaque, no evidence of significant diameter reduction  ? CVA (cerebral vascular accident) ([place]) 1994  ? Left brain  ? Diabetes mellitus without complication ([place])   ? History of CVA (cerebrovascular accident) 04/24/2013  ? Hypercholesterolemia   ? Hypertension   ? RENAL DOPPLER, 03/08/2009 - normal  ? Hypertension in pregnancy, preeclampsia, severe 04/24/2013  ? Obstructive sleep apnea 09/26/2005  ? AHI-19.46, during REM-36.88  ? Pacemaker St. Jude victory, dual chamber, 2006 04/24/2013  ? Pericardial effusion   ? 2D ECHO, 03/07/2011 - EF >70%, normal  ? Prostate cancer ([place])   ? Second degree heart block 04/24/2013  ? ? ?Past Surgical History:  ?Procedure Laterality Date  ? BIOPSY  05/11/2020  ? Procedure: BIOPSY;  Surgeon: Daneil Dolin, MD;  [facility];  Service: Endoscopy;;  ? COLONOSCOPY N/A 05/11/2020  ? Procedure: COLONOSCOPY;  Surgeon: Daneil Dolin, MD;  [facility];  Service: Endoscopy;  Laterality: N/A;  7:30am  ? EP IMPLANTABLE DEVICE N/A 11/21/2014  ? Procedure: PPM/BIV PPM Generator Changeout;  Surgeon: [name], MD;  Location:  [facility];  Service: Cardiovascular;  Laterality: N/A;  ? ESOPHAGOGASTRODUODENOSCOPY N/A 05/11/2020  ? Procedure: ESOPHAGOGASTRODUODENOSCOPY (EGD);  Surgeon: Daneil Dolin, MD;  [facility];  Service: Endoscopy;  Laterality: N/A;  ? EYE SURGERY     ? NM MYOVIEW LTD  03/31/2012  ? Normal study, no ECG changes, EKG negative for ischemia, post-stress EF 54%  ? PACEMAKER INSERTION  05/09/2005  ? [name], [place] #[id number], serial [id number], replaced 2016  ? POLYPECTOMY  05/11/2020  ? Procedure: POLYPECTOMY;  Surgeon: Daneil Dolin, MD;  [facility];  Service: Endoscopy;;  ? PROSTATE SURGERY  2002  ? removed- cancer  ? ? ?Current Medications: ?Outpatient Medications Prior to Visit  ?Medication Sig Dispense Refill  ? acetaminophen (TYLENOL) 500 MG tablet Take 1,000 mg by mouth every 6 (six) hours as needed (for pain.).    ? amLODipine (NORVASC) 10 MG tablet TAKE 1 TABLET BY MOUTH EVERY DAY 90 tablet 1  ? atorvastatin (LIPITOR) 80 MG tablet Take 80 mg by mouth at bedtime.    ? chlorthalidone (HYGROTON) 25 MG tablet TAKE 1/2 TABLET BY MOUTH EVERY DAY 15 tablet 10  ? cloNIDine (CATAPRES) 0.3 MG tablet TAKE 1 TABLET BY MOUTH THREE TIMES A DAY 270 tablet 3  ? doxazosin (CARDURA) 2 MG tablet TAKE 1 TABLET BY MOUTH EVERY DAY 30 tablet 11  ? escitalopram (LEXAPRO) 5 MG tablet Take 5 mg by mouth daily.    ? glipiZIDE (GLUCOTROL) 5 MG tablet Take 5 mg by mouth 2 (two) times daily.    ? KLOR-CON M20 20 MEQ tablet TAKE 1 TABLET BY MOUTH EVERY DAY 90 tablet 3  ? Magnesium Oxide 400 MG CAPS Take 1 capsule (400 mg total) by mouth daily. 90 capsule 3  ? metFORMIN (GLUCOPHAGE) 1000 MG tablet Take 1 tablet (1,000 mg total) by mouth 2 (two) times daily with a meal. 60 tablet 0  ? pantoprazole (PROTONIX) 40 MG tablet Take 1 tablet (40 mg total) by mouth daily. 30 tablet 0  ? valsartan (DIOVAN) 320 MG tablet TAKE 1 TABLET BY MOUTH EVERY DAY 90 tablet 3  ? ?No facility-administered medications prior to visit.  ?  ? ?Allergies:   Minoxidil  ? ?Social History  ? ?Socioeconomic History  ? Marital status: Married  ?  Spouse name: Richard Alvarado  ? Number of children: 1  ? Years of education: 18  ? Highest education level: Not on file  ?Occupational History  ? Occupation: reitred  ?   Comment: Interior and spatial designer, retired Garment/textile technologist  ?Tobacco Use  ? Smoking status: Former  ?  Types: Cigarettes  ?  Quit date: 06/09/1999  ?  Years since quitting: 22.3  ? Smokeless tobacco: Never  ?Substance and Sexual Activity  ? Alcohol use: No  ? Drug use: No  ? Sexual activity: Not Currently  ?Other Topics Concern  ? Not on file  ?Social History Narrative  ? Lives with wife  ? Caffeine free coffee only  ? ?Social Determinants of Health  ? ?Financial Resource Strain: Not on file  ?Food Insecurity: Not on file  ?Transportation Needs: Not on file  ?Physical Activity: Not on file  ?Stress: Not on file  ?Social Connections: Not on file  ?  ? ?Family History:  The patient's family history includes CVA in his mother; Diabetes in his sister; Heart attack in his father; Heart disease in his maternal grandmother; Hypertension in his sister; Stroke in his father and mother.  ? ?  ROS:   ?Please see the history of present illness.    ?ROS All other systems reviewed and are negative. ? ? ?PHYSICAL EXAM:   ?VS:  BP 140/76 (BP Location: Left Arm, Patient Position: Sitting, Cuff Size: Large)   Pulse 60   Ht '5\' 9"'$  (1.753 m)   Wt 208 lb 12.8 oz (94.7 kg)   SpO2 98%   BMI 30.83 kg/m?    ? ? ?General: Alert, oriented x3, no distress, borderline obese. Healthy left subclavian PM site ?Head: no evidence of trauma, PERRL, EOMI, no exophtalmos or lid lag, no myxedema, no xanthelasma; normal ears, nose and oropharynx ?Neck: normal jugular venous pulsations and no hepatojugular reflux; brisk carotid pulses without delay and no carotid bruits ?Chest: clear to auscultation, no signs of consolidation by percussion or palpation, normal fremitus, symmetrical and full respiratory excursions ?Cardiovascular: normal position and quality of the apical impulse, regular rhythm, normal first and second heart sounds, no murmurs, rubs or gallops ?Abdomen: no tenderness or distention, no masses by palpation, no abnormal pulsatility or arterial bruits,  normal bowel sounds, no hepatosplenomegaly ?Extremities: no clubbing, cyanosis or edema; 2+ radial, ulnar and brachial pulses bilaterally; 2+ right femoral, posterior tibial and dorsalis pedis pulses; 2+ left femor

## 2021-10-18 ENCOUNTER — Other Ambulatory Visit: Payer: Self-pay | Admitting: Cardiovascular Disease

## 2021-10-18 NOTE — Progress Notes (Signed)
Remote pacemaker transmission.   

## 2021-10-18 NOTE — Telephone Encounter (Signed)
Rx(s) sent to pharmacy electronically.  

## 2021-10-20 ENCOUNTER — Encounter: Payer: Self-pay | Admitting: Cardiovascular Disease

## 2021-11-12 DIAGNOSIS — E1122 Type 2 diabetes mellitus with diabetic chronic kidney disease: Secondary | ICD-10-CM | POA: Diagnosis not present

## 2021-11-12 DIAGNOSIS — I10 Essential (primary) hypertension: Secondary | ICD-10-CM | POA: Diagnosis not present

## 2021-11-19 DIAGNOSIS — I10 Essential (primary) hypertension: Secondary | ICD-10-CM | POA: Diagnosis not present

## 2021-11-19 DIAGNOSIS — D649 Anemia, unspecified: Secondary | ICD-10-CM | POA: Diagnosis not present

## 2021-11-19 DIAGNOSIS — E785 Hyperlipidemia, unspecified: Secondary | ICD-10-CM | POA: Diagnosis not present

## 2021-11-19 DIAGNOSIS — G4733 Obstructive sleep apnea (adult) (pediatric): Secondary | ICD-10-CM | POA: Diagnosis not present

## 2021-11-19 DIAGNOSIS — I441 Atrioventricular block, second degree: Secondary | ICD-10-CM | POA: Diagnosis not present

## 2021-11-19 DIAGNOSIS — Z8673 Personal history of transient ischemic attack (TIA), and cerebral infarction without residual deficits: Secondary | ICD-10-CM | POA: Diagnosis not present

## 2021-11-19 DIAGNOSIS — N1831 Chronic kidney disease, stage 3a: Secondary | ICD-10-CM | POA: Diagnosis not present

## 2021-11-19 DIAGNOSIS — E1122 Type 2 diabetes mellitus with diabetic chronic kidney disease: Secondary | ICD-10-CM | POA: Diagnosis not present

## 2021-11-19 DIAGNOSIS — Z8546 Personal history of malignant neoplasm of prostate: Secondary | ICD-10-CM | POA: Diagnosis not present

## 2021-12-26 ENCOUNTER — Other Ambulatory Visit: Payer: Self-pay | Admitting: Cardiovascular Disease

## 2021-12-27 NOTE — Telephone Encounter (Signed)
Please call pt to schedule overdue follow-up appointment with Dr. Oval Linsey for refills. Last seen by her in June 2022.

## 2021-12-30 NOTE — Telephone Encounter (Signed)
Rx(s) sent to pharmacy electronically.  

## 2022-01-03 ENCOUNTER — Ambulatory Visit (INDEPENDENT_AMBULATORY_CARE_PROVIDER_SITE_OTHER): Payer: Medicare Other

## 2022-01-03 DIAGNOSIS — I495 Sick sinus syndrome: Secondary | ICD-10-CM | POA: Diagnosis not present

## 2022-01-03 LAB — CUP PACEART REMOTE DEVICE CHECK
Battery Remaining Longevity: 38 mo
Battery Remaining Percentage: 35 %
Battery Voltage: 2.96 V
Brady Statistic AP VP Percent: 40 %
Brady Statistic AP VS Percent: 8 %
Brady Statistic AS VP Percent: 11 %
Brady Statistic AS VS Percent: 41 %
Brady Statistic RA Percent Paced: 48 %
Brady Statistic RV Percent Paced: 51 %
Date Time Interrogation Session: 20230728020013
Implantable Lead Implant Date: 20061201
Implantable Lead Implant Date: 20061201
Implantable Lead Location: 753859
Implantable Lead Location: 753860
Implantable Pulse Generator Implant Date: 20160614
Lead Channel Impedance Value: 480 Ohm
Lead Channel Impedance Value: 580 Ohm
Lead Channel Pacing Threshold Amplitude: 0.5 V
Lead Channel Pacing Threshold Amplitude: 0.75 V
Lead Channel Pacing Threshold Pulse Width: 0.5 ms
Lead Channel Pacing Threshold Pulse Width: 0.5 ms
Lead Channel Sensing Intrinsic Amplitude: 1.5 mV
Lead Channel Sensing Intrinsic Amplitude: 11.1 mV
Lead Channel Setting Pacing Amplitude: 2 V
Lead Channel Setting Pacing Amplitude: 2 V
Lead Channel Setting Pacing Pulse Width: 0.5 ms
Lead Channel Setting Sensing Sensitivity: 2 mV
Pulse Gen Model: 2240
Pulse Gen Serial Number: 7768207

## 2022-01-22 NOTE — Progress Notes (Signed)
Remote pacemaker transmission.   

## 2022-03-03 ENCOUNTER — Other Ambulatory Visit: Payer: Self-pay | Admitting: Cardiovascular Disease

## 2022-03-04 DIAGNOSIS — E1122 Type 2 diabetes mellitus with diabetic chronic kidney disease: Secondary | ICD-10-CM | POA: Diagnosis not present

## 2022-03-04 DIAGNOSIS — I10 Essential (primary) hypertension: Secondary | ICD-10-CM | POA: Diagnosis not present

## 2022-03-04 DIAGNOSIS — E785 Hyperlipidemia, unspecified: Secondary | ICD-10-CM | POA: Diagnosis not present

## 2022-03-11 DIAGNOSIS — I10 Essential (primary) hypertension: Secondary | ICD-10-CM | POA: Diagnosis not present

## 2022-03-11 DIAGNOSIS — K219 Gastro-esophageal reflux disease without esophagitis: Secondary | ICD-10-CM | POA: Diagnosis not present

## 2022-03-11 DIAGNOSIS — E785 Hyperlipidemia, unspecified: Secondary | ICD-10-CM | POA: Diagnosis not present

## 2022-03-11 DIAGNOSIS — E1122 Type 2 diabetes mellitus with diabetic chronic kidney disease: Secondary | ICD-10-CM | POA: Diagnosis not present

## 2022-03-11 DIAGNOSIS — Z8673 Personal history of transient ischemic attack (TIA), and cerebral infarction without residual deficits: Secondary | ICD-10-CM | POA: Diagnosis not present

## 2022-03-11 DIAGNOSIS — N1831 Chronic kidney disease, stage 3a: Secondary | ICD-10-CM | POA: Diagnosis not present

## 2022-03-11 DIAGNOSIS — I441 Atrioventricular block, second degree: Secondary | ICD-10-CM | POA: Diagnosis not present

## 2022-03-11 DIAGNOSIS — D649 Anemia, unspecified: Secondary | ICD-10-CM | POA: Diagnosis not present

## 2022-03-11 DIAGNOSIS — Z8546 Personal history of malignant neoplasm of prostate: Secondary | ICD-10-CM | POA: Diagnosis not present

## 2022-03-11 DIAGNOSIS — G4733 Obstructive sleep apnea (adult) (pediatric): Secondary | ICD-10-CM | POA: Diagnosis not present

## 2022-03-11 DIAGNOSIS — Z23 Encounter for immunization: Secondary | ICD-10-CM | POA: Diagnosis not present

## 2022-03-11 DIAGNOSIS — F329 Major depressive disorder, single episode, unspecified: Secondary | ICD-10-CM | POA: Diagnosis not present

## 2022-04-04 ENCOUNTER — Other Ambulatory Visit: Payer: Self-pay | Admitting: Cardiovascular Disease

## 2022-04-04 ENCOUNTER — Ambulatory Visit (INDEPENDENT_AMBULATORY_CARE_PROVIDER_SITE_OTHER): Payer: Medicare Other

## 2022-04-04 DIAGNOSIS — I495 Sick sinus syndrome: Secondary | ICD-10-CM | POA: Diagnosis not present

## 2022-04-04 LAB — CUP PACEART REMOTE DEVICE CHECK
Battery Remaining Longevity: 36 mo
Battery Remaining Percentage: 32 %
Battery Voltage: 2.96 V
Brady Statistic AP VP Percent: 39 %
Brady Statistic AP VS Percent: 8 %
Brady Statistic AS VP Percent: 10 %
Brady Statistic AS VS Percent: 42 %
Brady Statistic RA Percent Paced: 47 %
Brady Statistic RV Percent Paced: 49 %
Date Time Interrogation Session: 20231027044322
Implantable Lead Connection Status: 753985
Implantable Lead Connection Status: 753985
Implantable Lead Implant Date: 20061201
Implantable Lead Implant Date: 20061201
Implantable Lead Location: 753859
Implantable Lead Location: 753860
Implantable Pulse Generator Implant Date: 20160614
Lead Channel Impedance Value: 480 Ohm
Lead Channel Impedance Value: 600 Ohm
Lead Channel Pacing Threshold Amplitude: 0.5 V
Lead Channel Pacing Threshold Amplitude: 0.75 V
Lead Channel Pacing Threshold Pulse Width: 0.5 ms
Lead Channel Pacing Threshold Pulse Width: 0.5 ms
Lead Channel Sensing Intrinsic Amplitude: 1 mV
Lead Channel Sensing Intrinsic Amplitude: 12 mV
Lead Channel Setting Pacing Amplitude: 2 V
Lead Channel Setting Pacing Amplitude: 2 V
Lead Channel Setting Pacing Pulse Width: 0.5 ms
Lead Channel Setting Sensing Sensitivity: 2 mV
Pulse Gen Model: 2240
Pulse Gen Serial Number: 7768207

## 2022-04-07 NOTE — Telephone Encounter (Signed)
Rx(s) sent to pharmacy electronically.  

## 2022-04-08 DIAGNOSIS — N1831 Chronic kidney disease, stage 3a: Secondary | ICD-10-CM | POA: Diagnosis not present

## 2022-04-08 DIAGNOSIS — E871 Hypo-osmolality and hyponatremia: Secondary | ICD-10-CM | POA: Diagnosis not present

## 2022-04-08 DIAGNOSIS — I10 Essential (primary) hypertension: Secondary | ICD-10-CM | POA: Diagnosis not present

## 2022-04-08 DIAGNOSIS — E1122 Type 2 diabetes mellitus with diabetic chronic kidney disease: Secondary | ICD-10-CM | POA: Diagnosis not present

## 2022-04-09 NOTE — Progress Notes (Signed)
Remote pacemaker transmission.   

## 2022-04-16 ENCOUNTER — Other Ambulatory Visit: Payer: Self-pay | Admitting: Cardiovascular Disease

## 2022-04-16 NOTE — Telephone Encounter (Signed)
Pt of Dr. Croitoru. Please review for refill. Thank you! 

## 2022-05-08 DIAGNOSIS — N1831 Chronic kidney disease, stage 3a: Secondary | ICD-10-CM | POA: Diagnosis not present

## 2022-05-08 DIAGNOSIS — G4733 Obstructive sleep apnea (adult) (pediatric): Secondary | ICD-10-CM | POA: Diagnosis not present

## 2022-05-08 DIAGNOSIS — E785 Hyperlipidemia, unspecified: Secondary | ICD-10-CM | POA: Diagnosis not present

## 2022-05-08 DIAGNOSIS — I10 Essential (primary) hypertension: Secondary | ICD-10-CM | POA: Diagnosis not present

## 2022-05-08 DIAGNOSIS — K219 Gastro-esophageal reflux disease without esophagitis: Secondary | ICD-10-CM | POA: Diagnosis not present

## 2022-05-08 DIAGNOSIS — E1122 Type 2 diabetes mellitus with diabetic chronic kidney disease: Secondary | ICD-10-CM | POA: Diagnosis not present

## 2022-05-08 DIAGNOSIS — E871 Hypo-osmolality and hyponatremia: Secondary | ICD-10-CM | POA: Diagnosis not present

## 2022-06-29 ENCOUNTER — Emergency Department (HOSPITAL_COMMUNITY)
Admission: EM | Admit: 2022-06-29 | Discharge: 2022-06-29 | Disposition: A | Discharge status: Home / Self Care | Payer: Medicare Other | Attending: Emergency Medicine | Admitting: Emergency Medicine

## 2022-06-29 ENCOUNTER — Encounter (HOSPITAL_COMMUNITY): Payer: Self-pay

## 2022-06-29 ENCOUNTER — Emergency Department (HOSPITAL_COMMUNITY): Payer: Medicare Other

## 2022-06-29 ENCOUNTER — Other Ambulatory Visit: Payer: Self-pay

## 2022-06-29 DIAGNOSIS — R29818 Other symptoms and signs involving the nervous system: Secondary | ICD-10-CM | POA: Diagnosis not present

## 2022-06-29 DIAGNOSIS — I6782 Cerebral ischemia: Secondary | ICD-10-CM | POA: Diagnosis not present

## 2022-06-29 DIAGNOSIS — I440 Atrioventricular block, first degree: Secondary | ICD-10-CM | POA: Diagnosis not present

## 2022-06-29 DIAGNOSIS — G319 Degenerative disease of nervous system, unspecified: Secondary | ICD-10-CM | POA: Diagnosis not present

## 2022-06-29 DIAGNOSIS — J3489 Other specified disorders of nose and nasal sinuses: Secondary | ICD-10-CM | POA: Diagnosis not present

## 2022-06-29 DIAGNOSIS — I959 Hypotension, unspecified: Secondary | ICD-10-CM | POA: Diagnosis not present

## 2022-06-29 DIAGNOSIS — Z950 Presence of cardiac pacemaker: Secondary | ICD-10-CM | POA: Insufficient documentation

## 2022-06-29 DIAGNOSIS — R55 Syncope and collapse: Secondary | ICD-10-CM | POA: Diagnosis not present

## 2022-06-29 DIAGNOSIS — I10 Essential (primary) hypertension: Secondary | ICD-10-CM | POA: Diagnosis not present

## 2022-06-29 LAB — CBC WITH DIFFERENTIAL/PLATELET
Abs Immature Granulocytes: 0.02 10*3/uL (ref 0.00–0.07)
Basophils Absolute: 0 10*3/uL (ref 0.0–0.1)
Basophils Relative: 1 %
Eosinophils Absolute: 0.1 10*3/uL (ref 0.0–0.5)
Eosinophils Relative: 1 %
HCT: 42.6 % (ref 39.0–52.0)
Hemoglobin: 13.4 g/dL (ref 13.0–17.0)
Immature Granulocytes: 0 %
Lymphocytes Relative: 14 %
Lymphs Abs: 0.9 10*3/uL (ref 0.7–4.0)
MCH: 24.5 pg — ABNORMAL LOW (ref 26.0–34.0)
MCHC: 31.5 g/dL (ref 30.0–36.0)
MCV: 78 fL — ABNORMAL LOW (ref 80.0–100.0)
Monocytes Absolute: 0.6 10*3/uL (ref 0.1–1.0)
Monocytes Relative: 10 %
Neutro Abs: 4.8 10*3/uL (ref 1.7–7.7)
Neutrophils Relative %: 74 %
Platelets: 272 10*3/uL (ref 150–400)
RBC: 5.46 MIL/uL (ref 4.22–5.81)
RDW: 14.1 % (ref 11.5–15.5)
WBC: 6.5 10*3/uL (ref 4.0–10.5)
nRBC: 0 % (ref 0.0–0.2)

## 2022-06-29 LAB — COMPREHENSIVE METABOLIC PANEL
ALT: 15 U/L (ref 0–44)
AST: 21 U/L (ref 15–41)
Albumin: 3.7 g/dL (ref 3.5–5.0)
Alkaline Phosphatase: 57 U/L (ref 38–126)
Anion gap: 9 (ref 5–15)
BUN: 27 mg/dL — ABNORMAL HIGH (ref 8–23)
CO2: 21 mmol/L — ABNORMAL LOW (ref 22–32)
Calcium: 9.4 mg/dL (ref 8.9–10.3)
Chloride: 104 mmol/L (ref 98–111)
Creatinine, Ser: 1.52 mg/dL — ABNORMAL HIGH (ref 0.61–1.24)
GFR, Estimated: 45 mL/min — ABNORMAL LOW (ref >60)
Glucose, Bld: 169 mg/dL — ABNORMAL HIGH (ref 70–99)
Potassium: 3.2 mmol/L — ABNORMAL LOW (ref 3.5–5.1)
Sodium: 134 mmol/L — ABNORMAL LOW (ref 135–145)
Total Bilirubin: 0.7 mg/dL (ref 0.3–1.2)
Total Protein: 8.2 g/dL — ABNORMAL HIGH (ref 6.5–8.1)

## 2022-06-29 LAB — TROPONIN I (HIGH SENSITIVITY): Troponin I (High Sensitivity): 7 ng/L (ref <18)

## 2022-06-29 NOTE — ED Notes (Signed)
Pacemaker report: RVR/flutter episode that lasted 12 minutes this am at 0948. Rate 150-160. Pacemaker capturing and rate is not supposed to drop below 60. MD aware. Report being faxed to ED.

## 2022-06-29 NOTE — ED Notes (Signed)
Pt reports single syncopal episode while at church. Witnesses reported pacemaker activation. Pt was unconscious for approximately 3 minutes. Pt currently denied complaints of chest pain, breathing problems, nausea, headache, etc. Prior to incident, patient had no complaints and had no different activity.

## 2022-06-29 NOTE — ED Provider Notes (Signed)
[facility] Provider Note   CSN: [id number] Arrival date & time: 06/29/22  1152     History  Chief Complaint  Patient presents with   Loss of Consciousness    Richard Alvarado is a 82 y.o. male.  Patient has a pacemaker.  Patient had a syncopal episode for 3 minutes at church today.  Patient feels fine now.  Patient does not complain of any chest pain  The history is provided by the patient and medical records. No language interpreter was used.  Loss of Consciousness Episode history:  Single Most recent episode:  Today Duration:  3 minutes Progression:  Resolved Chronicity:  New Context: not blood draw and not dehydration   Witnessed: no   Relieved by:  Nothing Worsened by:  Nothing Ineffective treatments:  None tried Associated symptoms: no anxiety, no chest pain, no headaches and no seizures   Risk factors: no seizures        Home Medications Prior to Admission medications   Medication Sig Start Date End Date Taking? Authorizing Provider  acetaminophen (TYLENOL) 500 MG tablet Take 1,000 mg by mouth every 6 (six) hours as needed (for pain.).    [provider]  amLODipine (NORVASC) 10 MG tablet TAKE 1 TABLET BY MOUTH EVERY DAY 12/30/21   Croitoru, Mihai, MD  atorvastatin (LIPITOR) 80 MG tablet Take 80 mg by mouth at bedtime.    [provider]  chlorthalidone (HYGROTON) 25 MG tablet TAKE 1/2 TABLET BY MOUTH EVERY DAY 03/03/22   Croitoru, Mihai, MD  cloNIDine (CATAPRES) 0.3 MG tablet TAKE 1 TABLET BY MOUTH THREE TIMES A DAY 08/27/21   Skeet Latch, MD  doxazosin (CARDURA) 2 MG tablet TAKE 1 TABLET BY MOUTH EVERY DAY. Please schedule appointment for additional refills. 04/17/22   Croitoru, Mihai, MD  escitalopram (LEXAPRO) 5 MG tablet Take 5 mg by mouth daily. 05/08/20   [provider]  glipiZIDE (GLUCOTROL) 5 MG tablet Take 5 mg by mouth 2 (two) times daily. 10/05/21   [provider]   KLOR-CON M20 20 MEQ tablet TAKE 1 TABLET BY MOUTH EVERY DAY 08/27/21   Skeet Latch, MD  Magnesium Oxide 400 MG CAPS Take 1 capsule (400 mg total) by mouth daily. 10/18/20   Croitoru, Mihai, MD  metFORMIN (GLUCOPHAGE) 1000 MG tablet Take 1 tablet (1,000 mg total) by mouth 2 (two) times daily with a meal. 04/03/16   Love, Ivan Anchors, PA-C  pantoprazole (PROTONIX) 40 MG tablet Take 1 tablet (40 mg total) by mouth daily. 04/04/16   Love, Ivan Anchors, PA-C  valsartan (DIOVAN) 320 MG tablet TAKE 1 TABLET BY MOUTH EVERY DAY 04/07/22   Skeet Latch, MD      Allergies    Minoxidil    Review of Systems   Review of Systems  Constitutional:  Negative for appetite change and fatigue.  HENT:  Negative for congestion, ear discharge and sinus pressure.   Eyes:  Negative for discharge.  Respiratory:  Negative for cough.   Cardiovascular:  Positive for syncope. Negative for chest pain.  Gastrointestinal:  Negative for abdominal pain and diarrhea.  Genitourinary:  Negative for frequency and hematuria.  Musculoskeletal:  Negative for back pain.  Skin:  Negative for rash.  Neurological:  Positive for syncope. Negative for seizures and headaches.  Psychiatric/Behavioral:  Negative for hallucinations.     Physical Exam Updated Vital Signs BP (!) 167/85   Pulse 64   Temp 98.3 F (36.8 C)  Resp (!) 21   Ht '5\' 9"'$  (1.753 m)   Wt 83.9 kg   SpO2 98%   BMI 27.32 kg/m  Physical Exam Vitals and nursing note reviewed.  Constitutional:      Appearance: He is well-developed.  HENT:     Head: Normocephalic.     Mouth/Throat:     Mouth: Mucous membranes are moist.  Eyes:     General: No scleral icterus.    Conjunctiva/sclera: Conjunctivae normal.  Neck:     Thyroid: No thyromegaly.  Cardiovascular:     Rate and Rhythm: Normal rate and regular rhythm.     Heart sounds: No murmur heard.    No friction rub. No gallop.  Pulmonary:     Breath sounds: No stridor. No wheezing or rales.  Chest:      Chest wall: No tenderness.  Abdominal:     General: There is no distension.     Tenderness: There is no abdominal tenderness. There is no rebound.  Musculoskeletal:        General: Normal range of motion.     Cervical back: Neck supple.  Lymphadenopathy:     Cervical: No cervical adenopathy.  Skin:    Findings: No erythema or rash.  Neurological:     Mental Status: He is alert and oriented to person, place, and time.     Motor: No abnormal muscle tone.     Coordination: Coordination normal.  Psychiatric:        Behavior: Behavior normal.     ED Results / Procedures / Treatments   Labs (all labs ordered are listed, but only abnormal results are displayed) Labs Reviewed  CBC WITH DIFFERENTIAL/PLATELET - Abnormal; Notable for the following components:      Result Value   MCV 78.0 (*)    MCH 24.5 (*)    All other components within normal limits  COMPREHENSIVE METABOLIC PANEL - Abnormal; Notable for the following components:   Sodium 134 (*)    Potassium 3.2 (*)    CO2 21 (*)    Glucose, Bld 169 (*)    BUN 27 (*)    Creatinine, Ser 1.52 (*)    Total Protein 8.2 (*)    GFR, Estimated 45 (*)    All other components within normal limits  TROPONIN I (HIGH SENSITIVITY)    EKG None  Radiology DG Chest 2 View  Result Date: 06/29/2022 CLINICAL DATA:  Syncope. EXAM: CHEST - 2 VIEW COMPARISON:  03/21/2016 FINDINGS: There is a right chest wall pacer with leads in the right atrial appendage and right ventricle. Normal heart size. Aortic atherosclerosis. No pleural effusion or interstitial edema. No airspace consolidation. Platelike atelectasis versus scar noted within both lung bases. IMPRESSION: Bibasilar atelectasis versus scar. Electronically Signed   By: Kerby Moors M.D.   On: 06/29/2022 13:09   CT Head Wo Contrast  Result Date: 06/29/2022 CLINICAL DATA:  Neuro deficit, acute, stroke suspected EXAM: CT HEAD WITHOUT CONTRAST TECHNIQUE: Contiguous axial images were obtained  from the base of the skull through the vertex without intravenous contrast. RADIATION DOSE REDUCTION: This exam was performed according to the departmental dose-optimization program which includes automated exposure control, adjustment of the mA and/or kV according to patient size and/or use of iterative reconstruction technique. COMPARISON:  CT head March 21, 2016. FINDINGS: Brain: No evidence of acute large vascular territory infarction, hemorrhage, hydrocephalus, extra-axial collection or mass lesion/mass effect. Moderate patchy and confluent white matter hypodensities, nonspecific but compatible with chronic microvascular  ischemic disease. Cerebral atrophy. Vascular: Calcific atherosclerosis. No hyperdense vessel identified. Skull: No acute fracture. Sinuses/Orbits: Remote left medial orbital wall fracture. Mild paranasal sinus mucosal thickening. Other: No mastoid effusions. IMPRESSION: 1. No evidence of acute intracranial abnormality. 2. Moderate chronic microvascular ischemic disease. Electronically Signed   By: Margaretha Sheffield M.D.   On: 06/29/2022 12:59    Procedures Procedures    Medications Ordered in ED Medications - No data to display  ED Course/ Medical Decision Making/ A&P                             Medical Decision Making Amount and/or Complexity of Data Reviewed Labs: ordered. Radiology: ordered.  This patient presents to the ED for concern of syncope, this involves an extensive number of treatment options, and is a complaint that carries with it a high risk of complications and morbidity.  The differential diagnosis includes MI, PE   Co morbidities that complicate the patient evaluation  Pacemaker   Additional history obtained:  Additional history obtained from relative External records from outside source obtained and reviewed including hospital records   Lab Tests:  I Ordered, and personally interpreted labs.  The pertinent results include: Troponin normal,  CBC normal   Imaging Studies ordered:  I ordered imaging studies including chest x-ray and CT head I independently visualized and interpreted imaging which showed unremarkable I agree with the radiologist interpretation   Cardiac Monitoring: / EKG:  The patient was maintained on a cardiac monitor.  I personally viewed and interpreted the cardiac monitored which showed an underlying rhythm of: Normal sinus rhythm   Consultations Obtained:  I requested consultation with the cardiology,  and discussed lab and imaging findings as well as pertinent plan - they recommend: Felt like patient could go home with follow-up with cardiologist   Problem List / ED Course / Critical interventions / Medication management  Pacemaker and syncope I ordered medication including saline for dehydration Reevaluation of the patient after these medicines showed that the patient improved I have reviewed the patients home medicines and have made adjustments as needed   Social Determinants of Health:  None   Test / Admission - Considered:  None  Syncopal episode secondary to rapid heart rate for 10 minutes.  Patient will follow back up with cardiology        Final Clinical Impression(s) / ED Diagnoses Final diagnoses:  Syncope and collapse    Rx / DC Orders ED Discharge Orders     None         Milton Ferguson, MD 07/01/22 [id number]

## 2022-06-29 NOTE — ED Triage Notes (Signed)
BIB EMS with reports of syncopal episode while sitting in church that lasted approx. 3 minutes. Patient states that he felt fine prior to incident and he feels fine now. Has pacemaker and EMS noted that he would brady down and pacemaker would start working. No chest pain, SHOB, weakness, etc. Voiced.

## 2022-06-29 NOTE — Discharge Instructions (Signed)
Drink plenty of fluids.  Follow-up with your cardiologist in 1 to 2 weeks.  Return if any problems

## 2022-06-29 NOTE — ED Notes (Signed)
Pacemake currently being interrogated w/ st.jude transmitter.

## 2022-06-30 ENCOUNTER — Telehealth: Payer: Self-pay | Admitting: Cardiovascular Disease

## 2022-06-30 NOTE — Telephone Encounter (Signed)
Please have him stop Cardura and call us back with BP later this week. I reviewed his pacemaker download and all seems in order there.

## 2022-06-30 NOTE — Telephone Encounter (Signed)
Pt c/o Syncope: STAT if syncope occurred within 30 minutes and pt complains of lightheadedness High Priority if episode of passing out, completely, today or in last 24 hours   Did you pass out today? No    When is the last time you passed out? Yes    Has this occurred multiple times? No    Did you have any symptoms prior to passing out? Heard like a snoring or gurgle nose before experiencing syncope

## 2022-06-30 NOTE — Telephone Encounter (Signed)
Informed wife to have patient stop taking Cardura and to continue with BP/P diary. She verbalized understanding. She will call on Friday AM with VS.

## 2022-06-30 NOTE — Telephone Encounter (Signed)
Richard Alvarado with wife who stated patient "passed out during church yesterday and went to the emergency room." According to ED, patient had 3 minute syncopal episode, pacer was interrogated. He is resting today and staying hydrated. While on phone BP 95/58, P 64. Denies dizziness/lightheadedness.

## 2022-07-03 ENCOUNTER — Telehealth: Payer: Self-pay

## 2022-07-03 DIAGNOSIS — N183 Chronic kidney disease, stage 3 unspecified: Secondary | ICD-10-CM | POA: Diagnosis not present

## 2022-07-03 DIAGNOSIS — I441 Atrioventricular block, second degree: Secondary | ICD-10-CM | POA: Diagnosis not present

## 2022-07-03 DIAGNOSIS — I129 Hypertensive chronic kidney disease with stage 1 through stage 4 chronic kidney disease, or unspecified chronic kidney disease: Secondary | ICD-10-CM | POA: Diagnosis not present

## 2022-07-03 DIAGNOSIS — R55 Syncope and collapse: Secondary | ICD-10-CM | POA: Diagnosis not present

## 2022-07-03 DIAGNOSIS — N1831 Chronic kidney disease, stage 3a: Secondary | ICD-10-CM | POA: Diagnosis not present

## 2022-07-03 DIAGNOSIS — I10 Essential (primary) hypertension: Secondary | ICD-10-CM | POA: Diagnosis not present

## 2022-07-03 DIAGNOSIS — E871 Hypo-osmolality and hyponatremia: Secondary | ICD-10-CM | POA: Diagnosis not present

## 2022-07-03 DIAGNOSIS — Z8673 Personal history of transient ischemic attack (TIA), and cerebral infarction without residual deficits: Secondary | ICD-10-CM | POA: Diagnosis not present

## 2022-07-03 NOTE — Telephone Encounter (Signed)
        Patient  visited [place] on 1/21     Telephone encounter attempt : 1st   Patient declined call .    [facility], [facility]  [phone] [address], [place], [place] [address] Phone: [phone] Email: [email or web address]

## 2022-07-04 ENCOUNTER — Ambulatory Visit: Payer: Medicare Other | Attending: Cardiovascular Disease

## 2022-07-04 DIAGNOSIS — I495 Sick sinus syndrome: Secondary | ICD-10-CM

## 2022-07-04 LAB — CUP PACEART REMOTE DEVICE CHECK
Battery Remaining Longevity: 34 mo
Battery Remaining Percentage: 30 %
Battery Voltage: 2.95 V
Brady Statistic AP VP Percent: 38 %
Brady Statistic AP VS Percent: 8.8 %
Brady Statistic AS VP Percent: 8.6 %
Brady Statistic AS VS Percent: 44 %
Brady Statistic RA Percent Paced: 47 %
Brady Statistic RV Percent Paced: 47 %
Date Time Interrogation Session: 20240126022545
Implantable Lead Connection Status: 753985
Implantable Lead Connection Status: 753985
Implantable Lead Implant Date: 20061201
Implantable Lead Implant Date: 20061201
Implantable Lead Location: 753859
Implantable Lead Location: 753860
Implantable Pulse Generator Implant Date: 20160614
Lead Channel Impedance Value: 480 Ohm
Lead Channel Impedance Value: 610 Ohm
Lead Channel Pacing Threshold Amplitude: 0.5 V
Lead Channel Pacing Threshold Amplitude: 0.75 V
Lead Channel Pacing Threshold Pulse Width: 0.5 ms
Lead Channel Pacing Threshold Pulse Width: 0.5 ms
Lead Channel Sensing Intrinsic Amplitude: 1.9 mV
Lead Channel Sensing Intrinsic Amplitude: 12 mV
Lead Channel Setting Pacing Amplitude: 2 V
Lead Channel Setting Pacing Amplitude: 2 V
Lead Channel Setting Pacing Pulse Width: 0.5 ms
Lead Channel Setting Sensing Sensitivity: 2 mV
Pulse Gen Model: 2240
Pulse Gen Serial Number: 7768207

## 2022-07-04 NOTE — Telephone Encounter (Signed)
Pt calling back for an update. Assured her message was sent over to her this morning, RN will c/b as soon as she can.

## 2022-07-04 NOTE — Telephone Encounter (Signed)
Croitoru, Dani Gobble, MD  Fidel Levy, RN Caller: Unspecified (4 days ago, 11:06 AM) Vitals look OK. Is he doing OK with urine elimination after stopping the Cardura? If no problems there, continue meds as they are currently.

## 2022-07-04 NOTE — Addendum Note (Signed)
Addended by: Fidel Levy on: 07/04/2022 02:58 PM   Modules accepted: Orders

## 2022-07-04 NOTE — Telephone Encounter (Addendum)
Returned call to patient's wife. She is providing an update on his vitals. Patient saw PCP yesterday - no changes from PCP  Last Cardura dose 06/29/22 PM  95/58, HR 64 - Monday  134/72, HR 64 - Tuesday 99/56, HR 63 - Wednesday  122/72, HR 64 - Thursday  129/77, HR 67 - Friday    OK to call wife cell #: [phone] OK to leave a message on home #

## 2022-07-04 NOTE — Telephone Encounter (Signed)
Patient's wife is following up as advised.

## 2022-07-04 NOTE — Telephone Encounter (Signed)
Patients' wife aware of advice from MD No current issues with urination Advised to monitor

## 2022-07-08 DIAGNOSIS — E1122 Type 2 diabetes mellitus with diabetic chronic kidney disease: Secondary | ICD-10-CM | POA: Diagnosis not present

## 2022-07-08 DIAGNOSIS — I10 Essential (primary) hypertension: Secondary | ICD-10-CM | POA: Diagnosis not present

## 2022-07-14 DIAGNOSIS — I441 Atrioventricular block, second degree: Secondary | ICD-10-CM | POA: Diagnosis not present

## 2022-07-14 DIAGNOSIS — E785 Hyperlipidemia, unspecified: Secondary | ICD-10-CM | POA: Diagnosis not present

## 2022-07-14 DIAGNOSIS — G4733 Obstructive sleep apnea (adult) (pediatric): Secondary | ICD-10-CM | POA: Diagnosis not present

## 2022-07-14 DIAGNOSIS — Z8673 Personal history of transient ischemic attack (TIA), and cerebral infarction without residual deficits: Secondary | ICD-10-CM | POA: Diagnosis not present

## 2022-07-14 DIAGNOSIS — I10 Essential (primary) hypertension: Secondary | ICD-10-CM | POA: Diagnosis not present

## 2022-07-14 DIAGNOSIS — E1122 Type 2 diabetes mellitus with diabetic chronic kidney disease: Secondary | ICD-10-CM | POA: Diagnosis not present

## 2022-07-14 DIAGNOSIS — E871 Hypo-osmolality and hyponatremia: Secondary | ICD-10-CM | POA: Diagnosis not present

## 2022-07-14 DIAGNOSIS — Z8546 Personal history of malignant neoplasm of prostate: Secondary | ICD-10-CM | POA: Diagnosis not present

## 2022-07-14 DIAGNOSIS — K219 Gastro-esophageal reflux disease without esophagitis: Secondary | ICD-10-CM | POA: Diagnosis not present

## 2022-07-14 DIAGNOSIS — F329 Major depressive disorder, single episode, unspecified: Secondary | ICD-10-CM | POA: Diagnosis not present

## 2022-07-14 DIAGNOSIS — D649 Anemia, unspecified: Secondary | ICD-10-CM | POA: Diagnosis not present

## 2022-07-14 DIAGNOSIS — N1831 Chronic kidney disease, stage 3a: Secondary | ICD-10-CM | POA: Diagnosis not present

## 2022-07-21 NOTE — Progress Notes (Signed)
Remote pacemaker transmission.   

## 2022-08-07 ENCOUNTER — Other Ambulatory Visit: Payer: Self-pay | Admitting: Cardiovascular Disease

## 2022-08-19 ENCOUNTER — Other Ambulatory Visit: Payer: Self-pay | Admitting: Cardiovascular Disease

## 2022-08-19 DIAGNOSIS — E876 Hypokalemia: Secondary | ICD-10-CM

## 2022-08-19 NOTE — Telephone Encounter (Signed)
Patient of Dr. Croitoru. Please review for refill. Thank you!  

## 2022-09-22 ENCOUNTER — Other Ambulatory Visit: Payer: Self-pay | Admitting: Cardiovascular Disease

## 2022-09-22 NOTE — Telephone Encounter (Signed)
Rx(s) sent to pharmacy electronically.  

## 2022-10-03 ENCOUNTER — Ambulatory Visit (INDEPENDENT_AMBULATORY_CARE_PROVIDER_SITE_OTHER): Payer: Medicare Other

## 2022-10-03 DIAGNOSIS — I495 Sick sinus syndrome: Secondary | ICD-10-CM

## 2022-10-03 LAB — CUP PACEART REMOTE DEVICE CHECK
Battery Remaining Longevity: 31 mo
Battery Remaining Percentage: 28 %
Battery Voltage: 2.95 V
Brady Statistic AP VP Percent: 40 %
Brady Statistic AP VS Percent: 8.9 %
Brady Statistic AS VP Percent: 7.9 %
Brady Statistic AS VS Percent: 42 %
Brady Statistic RA Percent Paced: 49 %
Brady Statistic RV Percent Paced: 48 %
Date Time Interrogation Session: 20240426020012
Implantable Lead Connection Status: 753985
Implantable Lead Connection Status: 753985
Implantable Lead Implant Date: 20061201
Implantable Lead Implant Date: 20061201
Implantable Lead Location: 753859
Implantable Lead Location: 753860
Implantable Pulse Generator Implant Date: 20160614
Lead Channel Impedance Value: 480 Ohm
Lead Channel Impedance Value: 560 Ohm
Lead Channel Pacing Threshold Amplitude: 0.5 V
Lead Channel Pacing Threshold Amplitude: 0.75 V
Lead Channel Pacing Threshold Pulse Width: 0.5 ms
Lead Channel Pacing Threshold Pulse Width: 0.5 ms
Lead Channel Sensing Intrinsic Amplitude: 0.8 mV
Lead Channel Sensing Intrinsic Amplitude: 9.3 mV
Lead Channel Setting Pacing Amplitude: 2 V
Lead Channel Setting Pacing Amplitude: 2 V
Lead Channel Setting Pacing Pulse Width: 0.5 ms
Lead Channel Setting Sensing Sensitivity: 2 mV
Pulse Gen Model: 2240
Pulse Gen Serial Number: 7768207

## 2022-10-06 ENCOUNTER — Other Ambulatory Visit: Payer: Self-pay | Admitting: Cardiovascular Disease

## 2022-10-06 DIAGNOSIS — E876 Hypokalemia: Secondary | ICD-10-CM

## 2022-10-15 DIAGNOSIS — E1122 Type 2 diabetes mellitus with diabetic chronic kidney disease: Secondary | ICD-10-CM | POA: Diagnosis not present

## 2022-10-15 DIAGNOSIS — I10 Essential (primary) hypertension: Secondary | ICD-10-CM | POA: Diagnosis not present

## 2022-10-22 DIAGNOSIS — Z8673 Personal history of transient ischemic attack (TIA), and cerebral infarction without residual deficits: Secondary | ICD-10-CM | POA: Diagnosis not present

## 2022-10-22 DIAGNOSIS — E785 Hyperlipidemia, unspecified: Secondary | ICD-10-CM | POA: Diagnosis not present

## 2022-10-22 DIAGNOSIS — D649 Anemia, unspecified: Secondary | ICD-10-CM | POA: Diagnosis not present

## 2022-10-22 DIAGNOSIS — E1122 Type 2 diabetes mellitus with diabetic chronic kidney disease: Secondary | ICD-10-CM | POA: Diagnosis not present

## 2022-10-22 DIAGNOSIS — I10 Essential (primary) hypertension: Secondary | ICD-10-CM | POA: Diagnosis not present

## 2022-10-22 DIAGNOSIS — F329 Major depressive disorder, single episode, unspecified: Secondary | ICD-10-CM | POA: Diagnosis not present

## 2022-10-22 DIAGNOSIS — K219 Gastro-esophageal reflux disease without esophagitis: Secondary | ICD-10-CM | POA: Diagnosis not present

## 2022-10-22 DIAGNOSIS — E871 Hypo-osmolality and hyponatremia: Secondary | ICD-10-CM | POA: Diagnosis not present

## 2022-10-22 DIAGNOSIS — Z8546 Personal history of malignant neoplasm of prostate: Secondary | ICD-10-CM | POA: Diagnosis not present

## 2022-10-22 DIAGNOSIS — G4733 Obstructive sleep apnea (adult) (pediatric): Secondary | ICD-10-CM | POA: Diagnosis not present

## 2022-10-22 DIAGNOSIS — I441 Atrioventricular block, second degree: Secondary | ICD-10-CM | POA: Diagnosis not present

## 2022-10-22 DIAGNOSIS — N1831 Chronic kidney disease, stage 3a: Secondary | ICD-10-CM | POA: Diagnosis not present

## 2022-10-28 NOTE — Progress Notes (Signed)
Remote pacemaker transmission.   

## 2022-11-02 ENCOUNTER — Other Ambulatory Visit: Payer: Self-pay | Admitting: Cardiovascular Disease

## 2022-12-01 ENCOUNTER — Other Ambulatory Visit: Payer: Self-pay | Admitting: Cardiovascular Disease

## 2022-12-31 ENCOUNTER — Other Ambulatory Visit: Payer: Self-pay | Admitting: Cardiovascular Disease

## 2022-12-31 DIAGNOSIS — E876 Hypokalemia: Secondary | ICD-10-CM

## 2023-01-02 ENCOUNTER — Ambulatory Visit (INDEPENDENT_AMBULATORY_CARE_PROVIDER_SITE_OTHER): Payer: Medicare Other

## 2023-01-02 DIAGNOSIS — I495 Sick sinus syndrome: Secondary | ICD-10-CM

## 2023-01-02 LAB — CUP PACEART REMOTE DEVICE CHECK
Battery Remaining Longevity: 29 mo
Battery Remaining Percentage: 25 %
Battery Voltage: 2.93 V
Brady Statistic AP VP Percent: 43 %
Brady Statistic AP VS Percent: 9.1 %
Brady Statistic AS VP Percent: 7.2 %
Brady Statistic AS VS Percent: 40 %
Brady Statistic RA Percent Paced: 51 %
Brady Statistic RV Percent Paced: 50 %
Date Time Interrogation Session: 20240726020024
Implantable Lead Connection Status: 753985
Implantable Lead Connection Status: 753985
Implantable Lead Implant Date: 20061201
Implantable Lead Implant Date: 20061201
Implantable Lead Location: 753859
Implantable Lead Location: 753860
Implantable Pulse Generator Implant Date: 20160614
Lead Channel Impedance Value: 510 Ohm
Lead Channel Impedance Value: 590 Ohm
Lead Channel Pacing Threshold Amplitude: 0.5 V
Lead Channel Pacing Threshold Amplitude: 0.75 V
Lead Channel Pacing Threshold Pulse Width: 0.5 ms
Lead Channel Pacing Threshold Pulse Width: 0.5 ms
Lead Channel Sensing Intrinsic Amplitude: 1.4 mV
Lead Channel Sensing Intrinsic Amplitude: 12 mV
Lead Channel Setting Pacing Amplitude: 2 V
Lead Channel Setting Pacing Amplitude: 2 V
Lead Channel Setting Pacing Pulse Width: 0.5 ms
Lead Channel Setting Sensing Sensitivity: 2 mV
Pulse Gen Model: 2240
Pulse Gen Serial Number: 7768207

## 2023-01-09 NOTE — Progress Notes (Signed)
Remote pacemaker transmission.   

## 2023-02-17 DIAGNOSIS — E1122 Type 2 diabetes mellitus with diabetic chronic kidney disease: Secondary | ICD-10-CM | POA: Diagnosis not present

## 2023-02-17 DIAGNOSIS — I10 Essential (primary) hypertension: Secondary | ICD-10-CM | POA: Diagnosis not present

## 2023-02-18 LAB — LAB REPORT - SCANNED
A1c: 7.4
Albumin, Urine POC: 11.7
Albumin/Creatinine Ratio, Urine, POC: 17
Creatinine, POC: 69.6 mg/dL
EGFR: 45

## 2023-02-23 DIAGNOSIS — E1122 Type 2 diabetes mellitus with diabetic chronic kidney disease: Secondary | ICD-10-CM | POA: Diagnosis not present

## 2023-02-23 DIAGNOSIS — I129 Hypertensive chronic kidney disease with stage 1 through stage 4 chronic kidney disease, or unspecified chronic kidney disease: Secondary | ICD-10-CM | POA: Diagnosis not present

## 2023-02-23 DIAGNOSIS — I10 Essential (primary) hypertension: Secondary | ICD-10-CM | POA: Diagnosis not present

## 2023-02-23 DIAGNOSIS — E871 Hypo-osmolality and hyponatremia: Secondary | ICD-10-CM | POA: Diagnosis not present

## 2023-02-23 DIAGNOSIS — N1831 Chronic kidney disease, stage 3a: Secondary | ICD-10-CM | POA: Diagnosis not present

## 2023-02-23 DIAGNOSIS — Z0001 Encounter for general adult medical examination with abnormal findings: Secondary | ICD-10-CM | POA: Diagnosis not present

## 2023-02-23 DIAGNOSIS — E785 Hyperlipidemia, unspecified: Secondary | ICD-10-CM | POA: Diagnosis not present

## 2023-02-23 DIAGNOSIS — F329 Major depressive disorder, single episode, unspecified: Secondary | ICD-10-CM | POA: Diagnosis not present

## 2023-02-23 DIAGNOSIS — I441 Atrioventricular block, second degree: Secondary | ICD-10-CM | POA: Diagnosis not present

## 2023-02-23 DIAGNOSIS — Z23 Encounter for immunization: Secondary | ICD-10-CM | POA: Diagnosis not present

## 2023-02-23 DIAGNOSIS — I700 Atherosclerosis of aorta: Secondary | ICD-10-CM | POA: Diagnosis not present

## 2023-02-23 DIAGNOSIS — D631 Anemia in chronic kidney disease: Secondary | ICD-10-CM | POA: Diagnosis not present

## 2023-03-06 ENCOUNTER — Other Ambulatory Visit: Payer: Self-pay | Admitting: Cardiovascular Disease

## 2023-03-25 ENCOUNTER — Other Ambulatory Visit: Payer: Self-pay | Admitting: Cardiovascular Disease

## 2023-03-25 DIAGNOSIS — E876 Hypokalemia: Secondary | ICD-10-CM

## 2023-04-03 ENCOUNTER — Ambulatory Visit (INDEPENDENT_AMBULATORY_CARE_PROVIDER_SITE_OTHER): Payer: Medicare Other

## 2023-04-03 DIAGNOSIS — I495 Sick sinus syndrome: Secondary | ICD-10-CM

## 2023-04-03 LAB — CUP PACEART REMOTE DEVICE CHECK
Battery Remaining Longevity: 25 mo
Battery Remaining Percentage: 23 %
Battery Voltage: 2.92 V
Brady Statistic AP VP Percent: 43 %
Brady Statistic AP VS Percent: 9.5 %
Brady Statistic AS VP Percent: 6.7 %
Brady Statistic AS VS Percent: 40 %
Brady Statistic RA Percent Paced: 52 %
Brady Statistic RV Percent Paced: 50 %
Date Time Interrogation Session: 20241025020015
Implantable Lead Connection Status: 753985
Implantable Lead Connection Status: 753985
Implantable Lead Implant Date: 20061201
Implantable Lead Implant Date: 20061201
Implantable Lead Location: 753859
Implantable Lead Location: 753860
Implantable Pulse Generator Implant Date: 20160614
Lead Channel Impedance Value: 480 Ohm
Lead Channel Impedance Value: 580 Ohm
Lead Channel Pacing Threshold Amplitude: 0.5 V
Lead Channel Pacing Threshold Amplitude: 0.75 V
Lead Channel Pacing Threshold Pulse Width: 0.5 ms
Lead Channel Pacing Threshold Pulse Width: 0.5 ms
Lead Channel Sensing Intrinsic Amplitude: 12 mV
Lead Channel Sensing Intrinsic Amplitude: 2.2 mV
Lead Channel Setting Pacing Amplitude: 2 V
Lead Channel Setting Pacing Amplitude: 2 V
Lead Channel Setting Pacing Pulse Width: 0.5 ms
Lead Channel Setting Sensing Sensitivity: 2 mV
Pulse Gen Model: 2240
Pulse Gen Serial Number: 7768207

## 2023-04-20 NOTE — Progress Notes (Signed)
Remote pacemaker transmission.   

## 2023-04-22 ENCOUNTER — Other Ambulatory Visit: Payer: Self-pay | Admitting: Cardiovascular Disease

## 2023-04-22 DIAGNOSIS — Z23 Encounter for immunization: Secondary | ICD-10-CM | POA: Diagnosis not present

## 2023-04-28 ENCOUNTER — Other Ambulatory Visit: Payer: Self-pay | Admitting: Cardiovascular Disease

## 2023-06-01 ENCOUNTER — Telehealth: Payer: Self-pay | Admitting: Cardiovascular Disease

## 2023-06-01 ENCOUNTER — Other Ambulatory Visit: Payer: Self-pay | Admitting: Cardiovascular Disease

## 2023-06-01 MED ORDER — VALSARTAN 320 MG PO TABS: 320.0000 mg | ORAL_TABLET | Freq: Every day | ORAL | 1 refills | Status: DC

## 2023-06-01 NOTE — Telephone Encounter (Signed)
*  STAT* If patient is at the pharmacy, call can be transferred to refill team.   1. Which medications need to be refilled? (please list name of each medication and dose if known) valsartan (DIOVAN) 320 MG tablet   2. Which pharmacy/location (including street and city if local pharmacy) is medication to be sent to?  [id number] - [place], [place] - [address] AT [address]    3. Do they need a 30 day or 90 day supply? 90   Patient has appt on 07/16/23

## 2023-06-01 NOTE — Telephone Encounter (Signed)
Pt's medication was sent to pt's pharmacy as requested. Confirmation received.  °

## 2023-06-17 DIAGNOSIS — E1122 Type 2 diabetes mellitus with diabetic chronic kidney disease: Secondary | ICD-10-CM | POA: Diagnosis not present

## 2023-06-17 DIAGNOSIS — I10 Essential (primary) hypertension: Secondary | ICD-10-CM | POA: Diagnosis not present

## 2023-06-19 LAB — LAB REPORT - SCANNED
A1c: 7.3
Albumin, Urine POC: 51
Albumin/Creatinine Ratio, Urine, POC: 137
Creatinine, POC: 37.3 mg/dL
EGFR: 49

## 2023-06-24 DIAGNOSIS — F329 Major depressive disorder, single episode, unspecified: Secondary | ICD-10-CM | POA: Diagnosis not present

## 2023-06-24 DIAGNOSIS — E871 Hypo-osmolality and hyponatremia: Secondary | ICD-10-CM | POA: Diagnosis not present

## 2023-06-24 DIAGNOSIS — I10 Essential (primary) hypertension: Secondary | ICD-10-CM | POA: Diagnosis not present

## 2023-06-24 DIAGNOSIS — E1122 Type 2 diabetes mellitus with diabetic chronic kidney disease: Secondary | ICD-10-CM | POA: Diagnosis not present

## 2023-06-24 DIAGNOSIS — I441 Atrioventricular block, second degree: Secondary | ICD-10-CM | POA: Diagnosis not present

## 2023-06-24 DIAGNOSIS — Z8546 Personal history of malignant neoplasm of prostate: Secondary | ICD-10-CM | POA: Diagnosis not present

## 2023-06-24 DIAGNOSIS — E785 Hyperlipidemia, unspecified: Secondary | ICD-10-CM | POA: Diagnosis not present

## 2023-06-24 DIAGNOSIS — D631 Anemia in chronic kidney disease: Secondary | ICD-10-CM | POA: Diagnosis not present

## 2023-06-24 DIAGNOSIS — N1831 Chronic kidney disease, stage 3a: Secondary | ICD-10-CM | POA: Diagnosis not present

## 2023-06-24 DIAGNOSIS — Z8673 Personal history of transient ischemic attack (TIA), and cerebral infarction without residual deficits: Secondary | ICD-10-CM | POA: Diagnosis not present

## 2023-06-24 DIAGNOSIS — I700 Atherosclerosis of aorta: Secondary | ICD-10-CM | POA: Diagnosis not present

## 2023-06-24 DIAGNOSIS — R809 Proteinuria, unspecified: Secondary | ICD-10-CM | POA: Diagnosis not present

## 2023-07-03 ENCOUNTER — Other Ambulatory Visit: Payer: Self-pay | Admitting: Cardiovascular Disease

## 2023-07-03 ENCOUNTER — Ambulatory Visit (INDEPENDENT_AMBULATORY_CARE_PROVIDER_SITE_OTHER): Payer: Medicare Other

## 2023-07-03 DIAGNOSIS — E876 Hypokalemia: Secondary | ICD-10-CM

## 2023-07-03 DIAGNOSIS — I495 Sick sinus syndrome: Secondary | ICD-10-CM | POA: Diagnosis not present

## 2023-07-03 LAB — CUP PACEART REMOTE DEVICE CHECK
Battery Remaining Longevity: 24 mo
Battery Remaining Percentage: 21 %
Battery Voltage: 2.92 V
Brady Statistic AP VP Percent: 43 %
Brady Statistic AP VS Percent: 9.9 %
Brady Statistic AS VP Percent: 6.3 %
Brady Statistic AS VS Percent: 40 %
Brady Statistic RA Percent Paced: 53 %
Brady Statistic RV Percent Paced: 50 %
Date Time Interrogation Session: 20250124020016
Implantable Lead Connection Status: 753985
Implantable Lead Connection Status: 753985
Implantable Lead Implant Date: 20061201
Implantable Lead Implant Date: 20061201
Implantable Lead Location: 753859
Implantable Lead Location: 753860
Implantable Pulse Generator Implant Date: 20160614
Lead Channel Impedance Value: 510 Ohm
Lead Channel Impedance Value: 590 Ohm
Lead Channel Pacing Threshold Amplitude: 0.5 V
Lead Channel Pacing Threshold Amplitude: 0.75 V
Lead Channel Pacing Threshold Pulse Width: 0.5 ms
Lead Channel Pacing Threshold Pulse Width: 0.5 ms
Lead Channel Sensing Intrinsic Amplitude: 1.5 mV
Lead Channel Sensing Intrinsic Amplitude: 12 mV
Lead Channel Setting Pacing Amplitude: 2 V
Lead Channel Setting Pacing Amplitude: 2 V
Lead Channel Setting Pacing Pulse Width: 0.5 ms
Lead Channel Setting Sensing Sensitivity: 2 mV
Pulse Gen Model: 2240
Pulse Gen Serial Number: 7768207

## 2023-07-16 ENCOUNTER — Ambulatory Visit: Payer: Medicare Other | Attending: Cardiovascular Disease | Admitting: Cardiovascular Disease

## 2023-07-16 ENCOUNTER — Encounter: Payer: Self-pay | Admitting: Cardiovascular Disease

## 2023-07-16 VITALS — BP 100/72 | HR 75 | Ht 69.0 in | Wt 191.0 lb

## 2023-07-16 DIAGNOSIS — I4719 Other supraventricular tachycardia: Secondary | ICD-10-CM

## 2023-07-16 DIAGNOSIS — Z950 Presence of cardiac pacemaker: Secondary | ICD-10-CM | POA: Diagnosis not present

## 2023-07-16 DIAGNOSIS — E7800 Pure hypercholesterolemia, unspecified: Secondary | ICD-10-CM

## 2023-07-16 DIAGNOSIS — E119 Type 2 diabetes mellitus without complications: Secondary | ICD-10-CM

## 2023-07-16 DIAGNOSIS — I441 Atrioventricular block, second degree: Secondary | ICD-10-CM | POA: Diagnosis not present

## 2023-07-16 DIAGNOSIS — I672 Cerebral atherosclerosis: Secondary | ICD-10-CM

## 2023-07-16 DIAGNOSIS — I495 Sick sinus syndrome: Secondary | ICD-10-CM | POA: Diagnosis not present

## 2023-07-16 DIAGNOSIS — I10 Essential (primary) hypertension: Secondary | ICD-10-CM | POA: Diagnosis not present

## 2023-07-16 MED ORDER — CLONIDINE HCL 0.2 MG PO TABS: 0.2000 mg | ORAL_TABLET | Freq: Three times a day (TID) | ORAL | 3 refills | Status: DC

## 2023-07-16 NOTE — Patient Instructions (Addendum)
 Medication Instructions:    Decrease Clonidine   0.2 mg  three times a day    Do a blood pressure log for 10 days  send a copy - fax  [phone] *If you need a refill on your cardiac medications before your next appointment, please call your pharmacy*   Lab Work: Not needed    Testing/Procedures:  Not needed  Follow-Up: At [facility], you and your health needs are our priority.  As part of our continuing mission to provide you with exceptional heart care, we have created designated Provider Care Teams.  These Care Teams include your primary Cardiologist (physician) and Advanced Practice Providers (APPs -  Physician Assistants and Nurse Practitioners) who all work together to provide you with the care you need, when you need it.     Your next appointment:   12 month(s)  The format for your next appointment:   In Person  Provider:   Jerel Balding, MD

## 2023-07-16 NOTE — Progress Notes (Signed)
 Cardiology Office Note    Date:  07/16/2023   ID:  Richard Alvarado, DOB 09-16-1940, MRN [id number]  PCP:  Shona Norleen PEDLAR, MD  Cardiologist:   Jerel Balding, MD   Chief Complaint  Patient presents with   Pacemaker Check    History of Present Illness:  Richard Alvarado is a 83 y.o. male with second-degree atrioventricular block returning for pacemaker follow-up. He has hypertension, hyperlipidemia, DM and history of remote stroke. He had the pacemaker initially implanted in 2006 and underwent a pacemaker generator change out in June 2016 [facility] Jude [employer]urity).    He has no complaints.  He denies shortness of breath or chest pain at rest or with activity, edema, palpitations, dizziness, syncope, neurological complaints.  His wife and his son pointed out that he is very inactive and sleeps excessively.  He denies this.  He does not exercise.  Pacemaker interrogation shows normal device function with estimated generator longevity of about 2 years.  He has 53% atrial pacing and 50% ventricular pacing and has had a low burden of advanced atrial mode switch (he has never had atrial fibrillation but has frequent paroxysmal atrial tachycardia.he is atrial tachycardia is frequently not sustained nonsustained but occasionally he has had episodes lasting up to 5 minutes.  Rarely, he has longer episodes.  In the last few years he had a 26-minute event in December and a 1 hour and 44-minute episode in November 2023.  He usually has 1: 1 AV conduction during these episodes of atrial tachycardia.  He has never complained of symptoms during PAT.  Metabolic control is not ideal.  Labs from 06/17/2023 showed that his LDL cholesterol is 110, but his HDL and triglycerides are normal range.  Hemoglobin A1c is 7.3%.  He has a long-standing history of very severe and hard to control systemic hypertension. At one point he was taking minoxidil but this was stopped for a pericardial effusion. 20 years ago he had a  left brain stroke from which she has recovered without sequelae.  In October 2017 he had hemorrhage in the right posterior basal ganglia.  CT of the head shows extensive intracranial atherosclerosis with dense calcification of the carotid siphons, suspected at least moderate stenosis of the right vertebral artery origin, but no areas of severe stenosis in the circle of Willis.  Pertinent negatives include the absence of coronary insufficiency by nuclear stress testing and the absence of renal artery stenosis by ultrasonography.     Past Medical History:  Diagnosis Date   Arthritis    Cancer (HCC) 2002   prostate   Cerebral atherosclerosis    CAROTID DOPPLER, 09/07/2009 - RIGHT AND LEFT CCAs-small-moderate amount of irregular mixed density plaque, no significant evidence of diameter reduction; RIGHT AND LEFT ICAs-moderate amount irregular mixed density plaque, no evidence of significant diameter reduction   CVA (cerebral vascular accident) (HCC) 1994   Left brain   Diabetes mellitus without complication (HCC)    History of CVA (cerebrovascular accident) 04/24/2013   Hypercholesterolemia    Hypertension    RENAL DOPPLER, 03/08/2009 - normal   Hypertension in pregnancy, preeclampsia, severe 04/24/2013   Obstructive sleep apnea 09/26/2005   AHI-19.46, during REM-36.88   Pacemaker St. Jude victory, dual chamber, 2006 04/24/2013   Pericardial effusion    2D ECHO, 03/07/2011 - EF >70%, normal   Prostate cancer (HCC)    Second degree heart block 04/24/2013    Past Surgical History:  Procedure Laterality Date   BIOPSY  05/11/2020   Procedure: BIOPSY;  Surgeon: Shaaron Lamar HERO, MD;  Location: [facility];  Service: Endoscopy;;   COLONOSCOPY N/A 05/11/2020   Procedure: COLONOSCOPY;  Surgeon: Shaaron Lamar HERO, MD;  Location: [facility];  Service: Endoscopy;  Laterality: N/A;  7:30am   EP IMPLANTABLE DEVICE N/A 11/21/2014   Procedure: PPM/BIV PPM Generator Changeout;  Surgeon: Jerel Balding, MD;   Location: [facility];  Service: Cardiovascular;  Laterality: N/A;   ESOPHAGOGASTRODUODENOSCOPY N/A 05/11/2020   Procedure: ESOPHAGOGASTRODUODENOSCOPY (EGD);  Surgeon: Shaaron Lamar HERO, MD;  Location: [facility];  Service: Endoscopy;  Laterality: N/A;   EYE SURGERY     NM MYOVIEW  LTD  03/31/2012   Normal study, no ECG changes, EKG negative for ischemia, post-stress EF 54%   PACEMAKER INSERTION  05/09/2005   St. Jude [place], [place] #[id number], serial [phone], replaced 2016   POLYPECTOMY  05/11/2020   Procedure: POLYPECTOMY;  Surgeon: Shaaron Lamar HERO, MD;  Location: [facility];  Service: Endoscopy;;   PROSTATE SURGERY  2002   removed- cancer    Current Medications: Outpatient Medications Prior to Visit  Medication Sig Dispense Refill   acetaminophen  (TYLENOL ) 500 MG tablet Take 1,000 mg by mouth every 6 (six) hours as needed (for pain.).     amLODipine  (NORVASC ) 10 MG tablet Take 1 tablet (10 mg total) by mouth daily. Must keep scheduled appointment for future refills. Thank you. 30 tablet 0   atorvastatin  (LIPITOR ) 80 MG tablet Take 80 mg by mouth at bedtime.     chlorthalidone  (HYGROTON ) 25 MG tablet TAKE 1/2 TABLET BY MOUTH DAILY 15 tablet 10   escitalopram (LEXAPRO) 5 MG tablet Take 5 mg by mouth daily.     JARDIANCE 25 MG TABS tablet Take 25 mg by mouth daily.     Magnesium  Oxide 400 MG CAPS Take 1 capsule (400 mg total) by mouth daily. 90 capsule 3   metFORMIN  (GLUCOPHAGE ) 1000 MG tablet Take 1 tablet (1,000 mg total) by mouth 2 (two) times daily with a meal. 60 tablet 0   metFORMIN  (GLUCOPHAGE ) 500 MG tablet Take 500 mg by mouth 2 (two) times daily.     ONETOUCH VERIO test strip daily.     pantoprazole  (PROTONIX ) 40 MG tablet Take 1 tablet (40 mg total) by mouth daily. 30 tablet 0   potassium chloride  SA (KLOR-CON  M) 20 MEQ tablet Take 1 tablet (20 mEq total) by mouth daily. Must keep scheduled appointment for future refills. Thank you. 30 tablet 0   valsartan  (DIOVAN ) 320 MG  tablet Take 1 tablet (320 mg total) by mouth daily. NEED OV. 30 tablet 1   cloNIDine  (CATAPRES ) 0.3 MG tablet Take 1 tablet (0.3 mg total) by mouth 3 (three) times daily. Must keep scheduled appointment for future refills. Thank you. 90 tablet 0   glipiZIDE  (GLUCOTROL ) 5 MG tablet Take 5 mg by mouth 2 (two) times daily. (Patient not taking: Reported on 07/16/2023)     No facility-administered medications prior to visit.     Allergies:   Minoxidil   Social History   Socioeconomic History   Marital status: Married    Spouse name: Curtiss   Number of children: 1   Years of education: 12   Highest education level: Not on file  Occupational History   Occupation: reitred    Comment: set designer, retired technical sales engineer  Tobacco Use   Smoking status: Former    Current packs/day: 0.00    Types: Cigarettes  Quit date: 06/09/1999    Years since quitting: 24.1   Smokeless tobacco: Never  Substance and Sexual Activity   Alcohol use: No   Drug use: No   Sexual activity: Not Currently  Other Topics Concern   Not on file  Social History Narrative   Lives with wife   Caffeine free coffee only   Social Drivers of Corporate Investment Banker Strain: Not on file  Food Insecurity: Not on file  Transportation Needs: Not on file  Physical Activity: Not on file  Stress: Not on file  Social Connections: Not on file     Family History:  The patient's family history includes CVA in his mother; Diabetes in his sister; Heart attack in his father; Heart disease in his maternal grandmother; Hypertension in his sister; Stroke in his father and mother.   ROS:   Please see the history of present illness.    ROS All other systems reviewed and are negative.   PHYSICAL EXAM:   VS:  BP 100/72 (BP Location: Left Arm, Patient Position: Sitting, Cuff Size: Normal)   Pulse 75   Ht 5' 9 (1.753 m)   Wt 191 lb (86.6 kg)   SpO2 97%   BMI 28.21 kg/m       General: Alert, oriented x3, no distress, healthy  left subclavian pacemaker site Head: no evidence of trauma, PERRL, EOMI, no exophtalmos or lid lag, no myxedema, no xanthelasma; normal ears, nose and oropharynx Neck: normal jugular venous pulsations and no hepatojugular reflux; brisk carotid pulses without delay and no carotid bruits Chest: clear to auscultation, no signs of consolidation by percussion or palpation, normal fremitus, symmetrical and full respiratory excursions Cardiovascular: normal position and quality of the apical impulse, regular rhythm, normal first and second heart sounds, no murmurs, rubs or gallops Abdomen: no tenderness or distention, no masses by palpation, no abnormal pulsatility or arterial bruits, normal bowel sounds, no hepatosplenomegaly Extremities: no clubbing, cyanosis or edema; 2+ radial, ulnar and brachial pulses bilaterally; 2+ right femoral, posterior tibial and dorsalis pedis pulses; 2+ left femoral, posterior tibial and dorsalis pedis pulses; no subclavian or femoral bruits Neurological: grossly nonfocal Psych: Normal mood and affect     Wt Readings from Last 3 Encounters:  07/16/23 191 lb (86.6 kg)  06/29/22 185 lb (83.9 kg)  10/14/21 208 lb 12.8 oz (94.7 kg)      Studies/Labs Reviewed:   EKG:    EKG Interpretation Date/Time:  Thursday July 16 2023 09:53:25 EST Ventricular Rate:  75 PR Interval:  210 QRS Duration:  200 QT Interval:  454 QTC Calculation: 506 R Axis:   -88  Text Interpretation: Atrial-sensed ventricular-paced rhythm with prolonged AV conduction When compared with ECG of 29-Jun-2022 12:01, PREVIOUS ECG IS PRESENT Confirmed by Folashade Gamboa ([id number]) on 07/16/2023 10:08:53 AM         Recent Labs: No results found for requested labs within last 365 days.   Lipid Panel    Component Value Date/Time   CHOL 136 12/28/2018 0000   TRIG 136 12/28/2018 0000   HDL 33 (A) 12/28/2018 0000   CHOLHDL 3.3 03/22/2016 0528   VLDL 9 03/22/2016 0528   LDLCALC 80 12/28/2018 0000    08/05/2021 creatinine 1.3, hemoglobin 12.5, hemoglobin A1c 7.1%,  total cholesterol 123, triglycerides 129, HDL 29, LDL 71.  06/17/2023 Cholesterol 173, triglycerides 77, HDL 48 (better), LDL 110 (worse) Hemoglobin 14.1, potassium 4.2, ALT 12  ASSESSMENT:    1. SSS (sick sinus syndrome) (HCC)  2. Second degree AV block   3. PAT (paroxysmal atrial tachycardia) (HCC)   4. Essential hypertension   5. Pacemaker   6. Hypercholesterolemia   7. Diabetes mellitus type 2 in nonobese (HCC)   8. Intracranial atherosclerosis      PLAN:  In order of problems listed above:  Second degree AV block/SSS: We stopped his beta-blocker to limit ventricular pacing, but this still occurs about 50% of the time.  Heart rate histogram is appropriate for his level of activity. PM: Normal device function.  Lead parameters actively tested today and are within normal range.  Continue remote downloads every 3 months. PAT: This has been consistently asymptomatic.  He did have a 25 minutes episode of atrial tachycardia with mostly 1: 1 AV conduction in December.  Usually the episodes are much shorter, rarely over 3 or 4 minutes in duration.  Most episodes are nonsustained. HTN: Excessively well controlled, on high doses of multiple meds.  The biggest complaint from his family appears to be his fatigue and somnolence.  Will reduce his dose of clonidine  to 0.2 mg 3 times daily.  Will try to continue weaning this as long as his blood pressure remains in desirable range.  Avoiding beta-blockers to reduce the burden of ventricular pacing.  He has a history of minoxidil related pericardial effusion.  He is on maximum doses of dihydropyridine and ARB. HLP: Most recent labs are mixed bag with improvement in HDL but worsening LDL cholesterol.  Target LDL less than 70.  He is already on maximum dose of atorvastatin .  We could add ezetimibe 10 mg daily.  His wife reports that his diet has been worsening the last 3 months and  would like to see what happens with improvement in his diet. DM: Acceptable glucose control.  On Jardiance and metformin .  I did like to encourage him to be more physically active.  Maybe when we cut back on the clonidine  he would be more interested in dose. Remote ischemic CVA and remote ICH:  Parenchymal bleed R basal ganglia 2017. No new events.  He has barely noticeable residual left hemiparesis.  He can walk without assistance, without recent falls or injuries. Intracranial atherosclerosis:    See CTAngio 2017.  I am worried that his low blood pressure may be compounding the effects of his intracranial stenoses.  Would target an ideal blood pressure in the 130-140/70-80 range.    Medication Adjustments/Labs and Tests Ordered: Current medicines are reviewed at length with the patient today.  Concerns regarding medicines are outlined above.  Medication changes, Labs and Tests ordered today are listed in the Patient Instructions below. Patient Instructions  Medication Instructions:    Decrease Clonidine   0.2 mg  three times a day    Do a blood pressure log for 10 days  send a copy - fax  [phone] *If you need a refill on your cardiac medications before your next appointment, please call your pharmacy*   Lab Work: Not needed    Testing/Procedures:  Not needed  Follow-Up: At [facility], you and your health needs are our priority.  As part of our continuing mission to provide you with exceptional heart care, we have created designated Provider Care Teams.  These Care Teams include your primary Cardiologist (physician) and Advanced Practice Providers (APPs -  Physician Assistants and Nurse Practitioners) who all work together to provide you with the care you need, when you need it.     Your next appointment:  12 month(s)  The format for your next appointment:   In Person  Provider:   Jerel Balding, MD       Signed, Jerel Balding, MD  07/16/2023 3:22 PM    [facility]  [facility] [address] [place], [place], [place]  [address] Phone: [phone]; Fax: [phone]

## 2023-07-24 ENCOUNTER — Other Ambulatory Visit: Payer: Self-pay | Admitting: Cardiovascular Disease

## 2023-07-30 ENCOUNTER — Other Ambulatory Visit: Payer: Self-pay | Admitting: Cardiovascular Disease

## 2023-07-30 ENCOUNTER — Telehealth: Payer: Self-pay | Admitting: *Deleted

## 2023-07-30 NOTE — Telephone Encounter (Signed)
Spoke with pt wife, aware dr croitoru has reviewed the blood pressure readings he sent to [place], under media in his chart, and he is not making any changes in medications.

## 2023-08-04 ENCOUNTER — Other Ambulatory Visit: Payer: Self-pay | Admitting: Cardiovascular Disease

## 2023-08-04 DIAGNOSIS — E876 Hypokalemia: Secondary | ICD-10-CM

## 2023-08-04 NOTE — Progress Notes (Signed)
 Remote pacemaker transmission.

## 2023-10-02 ENCOUNTER — Ambulatory Visit (INDEPENDENT_AMBULATORY_CARE_PROVIDER_SITE_OTHER): Payer: Medicare Other

## 2023-10-02 DIAGNOSIS — I441 Atrioventricular block, second degree: Secondary | ICD-10-CM

## 2023-10-02 LAB — CUP PACEART REMOTE DEVICE CHECK
Battery Remaining Longevity: 20 mo
Battery Remaining Percentage: 18 %
Battery Voltage: 2.89 V
Brady Statistic AP VP Percent: 22 %
Brady Statistic AP VS Percent: 12 %
Brady Statistic AS VP Percent: 4.4 %
Brady Statistic AS VS Percent: 61 %
Brady Statistic RA Percent Paced: 34 %
Brady Statistic RV Percent Paced: 26 %
Date Time Interrogation Session: 20250425020013
Implantable Lead Connection Status: 753985
Implantable Lead Connection Status: 753985
Implantable Lead Implant Date: 20061201
Implantable Lead Implant Date: 20061201
Implantable Lead Location: 753859
Implantable Lead Location: 753860
Implantable Pulse Generator Implant Date: 20160614
Lead Channel Impedance Value: 510 Ohm
Lead Channel Impedance Value: 590 Ohm
Lead Channel Pacing Threshold Amplitude: 0.5 V
Lead Channel Pacing Threshold Amplitude: 0.75 V
Lead Channel Pacing Threshold Pulse Width: 0.5 ms
Lead Channel Pacing Threshold Pulse Width: 0.5 ms
Lead Channel Sensing Intrinsic Amplitude: 12 mV
Lead Channel Sensing Intrinsic Amplitude: 2.3 mV
Lead Channel Setting Pacing Amplitude: 2 V
Lead Channel Setting Pacing Amplitude: 2 V
Lead Channel Setting Pacing Pulse Width: 0.5 ms
Lead Channel Setting Sensing Sensitivity: 2 mV
Pulse Gen Model: 2240
Pulse Gen Serial Number: 7768207

## 2023-10-06 ENCOUNTER — Other Ambulatory Visit: Payer: Self-pay | Admitting: Cardiovascular Disease

## 2023-10-19 DIAGNOSIS — E1122 Type 2 diabetes mellitus with diabetic chronic kidney disease: Secondary | ICD-10-CM | POA: Diagnosis not present

## 2023-10-19 DIAGNOSIS — I10 Essential (primary) hypertension: Secondary | ICD-10-CM | POA: Diagnosis not present

## 2023-10-20 LAB — LAB REPORT - SCANNED
A1c: 7.3
Albumin, Urine POC: 5.9
Creatinine, POC: 34.7 mg/dL
EGFR (African American): 43
Microalb Creat Ratio: 17

## 2023-10-23 DIAGNOSIS — D649 Anemia, unspecified: Secondary | ICD-10-CM | POA: Diagnosis not present

## 2023-10-23 DIAGNOSIS — E1122 Type 2 diabetes mellitus with diabetic chronic kidney disease: Secondary | ICD-10-CM | POA: Diagnosis not present

## 2023-10-23 DIAGNOSIS — Z8673 Personal history of transient ischemic attack (TIA), and cerebral infarction without residual deficits: Secondary | ICD-10-CM | POA: Diagnosis not present

## 2023-10-23 DIAGNOSIS — I441 Atrioventricular block, second degree: Secondary | ICD-10-CM | POA: Diagnosis not present

## 2023-10-23 DIAGNOSIS — E871 Hypo-osmolality and hyponatremia: Secondary | ICD-10-CM | POA: Diagnosis not present

## 2023-10-23 DIAGNOSIS — R809 Proteinuria, unspecified: Secondary | ICD-10-CM | POA: Diagnosis not present

## 2023-10-23 DIAGNOSIS — I700 Atherosclerosis of aorta: Secondary | ICD-10-CM | POA: Diagnosis not present

## 2023-10-23 DIAGNOSIS — I10 Essential (primary) hypertension: Secondary | ICD-10-CM | POA: Diagnosis not present

## 2023-10-23 DIAGNOSIS — Z8546 Personal history of malignant neoplasm of prostate: Secondary | ICD-10-CM | POA: Diagnosis not present

## 2023-10-23 DIAGNOSIS — F329 Major depressive disorder, single episode, unspecified: Secondary | ICD-10-CM | POA: Diagnosis not present

## 2023-10-23 DIAGNOSIS — N1831 Chronic kidney disease, stage 3a: Secondary | ICD-10-CM | POA: Diagnosis not present

## 2023-10-23 DIAGNOSIS — E785 Hyperlipidemia, unspecified: Secondary | ICD-10-CM | POA: Diagnosis not present

## 2023-11-09 NOTE — Progress Notes (Signed)
 Remote pacemaker transmission.

## 2023-11-20 DIAGNOSIS — N1831 Chronic kidney disease, stage 3a: Secondary | ICD-10-CM | POA: Diagnosis not present

## 2023-11-20 DIAGNOSIS — I129 Hypertensive chronic kidney disease with stage 1 through stage 4 chronic kidney disease, or unspecified chronic kidney disease: Secondary | ICD-10-CM | POA: Diagnosis not present

## 2023-11-20 DIAGNOSIS — E871 Hypo-osmolality and hyponatremia: Secondary | ICD-10-CM | POA: Diagnosis not present

## 2023-11-20 DIAGNOSIS — I10 Essential (primary) hypertension: Secondary | ICD-10-CM | POA: Diagnosis not present

## 2024-01-01 ENCOUNTER — Ambulatory Visit: Payer: Medicare Other

## 2024-01-01 DIAGNOSIS — I441 Atrioventricular block, second degree: Secondary | ICD-10-CM | POA: Diagnosis not present

## 2024-01-01 LAB — CUP PACEART REMOTE DEVICE CHECK
Battery Remaining Longevity: 18 mo
Battery Remaining Percentage: 16 %
Battery Voltage: 2.89 V
Brady Statistic AP VP Percent: 26 %
Brady Statistic AP VS Percent: 12 %
Brady Statistic AS VP Percent: 5.2 %
Brady Statistic AS VS Percent: 57 %
Brady Statistic RA Percent Paced: 37 %
Brady Statistic RV Percent Paced: 31 %
Date Time Interrogation Session: 20250725020014
Implantable Lead Connection Status: 753985
Implantable Lead Connection Status: 753985
Implantable Lead Implant Date: 20061201
Implantable Lead Implant Date: 20061201
Implantable Lead Location: 753859
Implantable Lead Location: 753860
Implantable Pulse Generator Implant Date: 20160614
Lead Channel Impedance Value: 480 Ohm
Lead Channel Impedance Value: 560 Ohm
Lead Channel Pacing Threshold Amplitude: 0.5 V
Lead Channel Pacing Threshold Amplitude: 0.75 V
Lead Channel Pacing Threshold Pulse Width: 0.5 ms
Lead Channel Pacing Threshold Pulse Width: 0.5 ms
Lead Channel Sensing Intrinsic Amplitude: 1.2 mV
Lead Channel Sensing Intrinsic Amplitude: 10.9 mV
Lead Channel Setting Pacing Amplitude: 2 V
Lead Channel Setting Pacing Amplitude: 2 V
Lead Channel Setting Pacing Pulse Width: 0.5 ms
Lead Channel Setting Sensing Sensitivity: 2 mV
Pulse Gen Model: 2240
Pulse Gen Serial Number: 7768207

## 2024-01-11 ENCOUNTER — Ambulatory Visit: Payer: Self-pay | Admitting: Cardiovascular Disease

## 2024-02-19 DIAGNOSIS — I10 Essential (primary) hypertension: Secondary | ICD-10-CM | POA: Diagnosis not present

## 2024-02-19 DIAGNOSIS — E1122 Type 2 diabetes mellitus with diabetic chronic kidney disease: Secondary | ICD-10-CM | POA: Diagnosis not present

## 2024-02-20 LAB — LAB REPORT - SCANNED
A1c: 7.2
Albumin, Urine POC: 46.4
Creatinine, POC: 26.3 mg/dL
EGFR: 53
Microalb Creat Ratio: 176

## 2024-02-25 DIAGNOSIS — F329 Major depressive disorder, single episode, unspecified: Secondary | ICD-10-CM | POA: Diagnosis not present

## 2024-02-25 DIAGNOSIS — I441 Atrioventricular block, second degree: Secondary | ICD-10-CM | POA: Diagnosis not present

## 2024-02-25 DIAGNOSIS — I129 Hypertensive chronic kidney disease with stage 1 through stage 4 chronic kidney disease, or unspecified chronic kidney disease: Secondary | ICD-10-CM | POA: Diagnosis not present

## 2024-02-25 DIAGNOSIS — I10 Essential (primary) hypertension: Secondary | ICD-10-CM | POA: Diagnosis not present

## 2024-02-25 DIAGNOSIS — R809 Proteinuria, unspecified: Secondary | ICD-10-CM | POA: Diagnosis not present

## 2024-02-25 DIAGNOSIS — E785 Hyperlipidemia, unspecified: Secondary | ICD-10-CM | POA: Diagnosis not present

## 2024-02-25 DIAGNOSIS — Z0001 Encounter for general adult medical examination with abnormal findings: Secondary | ICD-10-CM | POA: Diagnosis not present

## 2024-02-25 DIAGNOSIS — N1831 Chronic kidney disease, stage 3a: Secondary | ICD-10-CM | POA: Diagnosis not present

## 2024-02-25 DIAGNOSIS — E871 Hypo-osmolality and hyponatremia: Secondary | ICD-10-CM | POA: Diagnosis not present

## 2024-02-25 DIAGNOSIS — E1165 Type 2 diabetes mellitus with hyperglycemia: Secondary | ICD-10-CM | POA: Diagnosis not present

## 2024-02-25 DIAGNOSIS — E1122 Type 2 diabetes mellitus with diabetic chronic kidney disease: Secondary | ICD-10-CM | POA: Diagnosis not present

## 2024-02-25 DIAGNOSIS — Z23 Encounter for immunization: Secondary | ICD-10-CM | POA: Diagnosis not present

## 2024-03-10 DIAGNOSIS — Z23 Encounter for immunization: Secondary | ICD-10-CM | POA: Diagnosis not present

## 2024-03-10 NOTE — Progress Notes (Signed)
 Remote PPM Transmission

## 2024-04-01 ENCOUNTER — Ambulatory Visit (INDEPENDENT_AMBULATORY_CARE_PROVIDER_SITE_OTHER): Payer: Medicare Other

## 2024-04-01 DIAGNOSIS — I441 Atrioventricular block, second degree: Secondary | ICD-10-CM | POA: Diagnosis not present

## 2024-04-02 LAB — CUP PACEART REMOTE DEVICE CHECK
Battery Remaining Longevity: 16 mo
Battery Remaining Percentage: 14 %
Battery Voltage: 2.87 V
Brady Statistic AP VP Percent: 27 %
Brady Statistic AP VS Percent: 10 %
Brady Statistic AS VP Percent: 7.7 %
Brady Statistic AS VS Percent: 55 %
Brady Statistic RA Percent Paced: 37 %
Brady Statistic RV Percent Paced: 35 %
Date Time Interrogation Session: 20251024021507
Implantable Lead Connection Status: 753985
Implantable Lead Connection Status: 753985
Implantable Lead Implant Date: 20061201
Implantable Lead Implant Date: 20061201
Implantable Lead Location: 753859
Implantable Lead Location: 753860
Implantable Pulse Generator Implant Date: 20160614
Lead Channel Impedance Value: 530 Ohm
Lead Channel Impedance Value: 590 Ohm
Lead Channel Pacing Threshold Amplitude: 0.5 V
Lead Channel Pacing Threshold Amplitude: 0.75 V
Lead Channel Pacing Threshold Pulse Width: 0.5 ms
Lead Channel Pacing Threshold Pulse Width: 0.5 ms
Lead Channel Sensing Intrinsic Amplitude: 1 mV
Lead Channel Sensing Intrinsic Amplitude: 11.5 mV
Lead Channel Setting Pacing Amplitude: 2 V
Lead Channel Setting Pacing Amplitude: 2 V
Lead Channel Setting Pacing Pulse Width: 0.5 ms
Lead Channel Setting Sensing Sensitivity: 2 mV
Pulse Gen Model: 2240
Pulse Gen Serial Number: 7768207

## 2024-04-04 NOTE — Progress Notes (Signed)
 Remote PPM Transmission

## 2024-04-05 ENCOUNTER — Ambulatory Visit: Payer: Self-pay | Admitting: Cardiovascular Disease

## 2024-07-01 ENCOUNTER — Ambulatory Visit: Payer: Medicare Other

## 2024-07-01 DIAGNOSIS — I441 Atrioventricular block, second degree: Secondary | ICD-10-CM

## 2024-07-01 LAB — CUP PACEART REMOTE DEVICE CHECK
Battery Remaining Longevity: 14 mo
Battery Remaining Percentage: 12 %
Battery Voltage: 2.86 V
Brady Statistic AP VP Percent: 27 %
Brady Statistic AP VS Percent: 9.4 %
Brady Statistic AS VP Percent: 9.1 %
Brady Statistic AS VS Percent: 55 %
Brady Statistic RA Percent Paced: 36 %
Brady Statistic RV Percent Paced: 36 %
Date Time Interrogation Session: 20260123020024
Implantable Lead Connection Status: 753985
Implantable Lead Connection Status: 753985
Implantable Lead Implant Date: 20061201
Implantable Lead Implant Date: 20061201
Implantable Lead Location: 753859
Implantable Lead Location: 753860
Implantable Pulse Generator Implant Date: 20160614
Lead Channel Impedance Value: 510 Ohm
Lead Channel Impedance Value: 590 Ohm
Lead Channel Pacing Threshold Amplitude: 0.5 V
Lead Channel Pacing Threshold Amplitude: 0.75 V
Lead Channel Pacing Threshold Pulse Width: 0.5 ms
Lead Channel Pacing Threshold Pulse Width: 0.5 ms
Lead Channel Sensing Intrinsic Amplitude: 1.1 mV
Lead Channel Sensing Intrinsic Amplitude: 12 mV
Lead Channel Setting Pacing Amplitude: 2 V
Lead Channel Setting Pacing Amplitude: 2 V
Lead Channel Setting Pacing Pulse Width: 0.5 ms
Lead Channel Setting Sensing Sensitivity: 2 mV
Pulse Gen Model: 2240
Pulse Gen Serial Number: 7768207

## 2024-07-02 NOTE — Progress Notes (Signed)
 Remote PPM Transmission

## 2024-07-03 ENCOUNTER — Ambulatory Visit: Payer: Self-pay | Admitting: Cardiovascular Disease

## 2024-07-08 ENCOUNTER — Other Ambulatory Visit: Payer: Self-pay | Admitting: Cardiovascular Disease

## 2024-07-28 ENCOUNTER — Ambulatory Visit: Admitting: Cardiovascular Disease

## 2024-09-30 ENCOUNTER — Ambulatory Visit: Payer: Medicare Other

## 2024-12-30 ENCOUNTER — Ambulatory Visit: Payer: Medicare Other
